# Patient Record
Sex: Female | Born: 1950
Health system: Southern US, Community
[De-identification: ages and names within clinical notes are randomized; demographics above are authoritative.]

## PROBLEM LIST (undated history)

## (undated) DIAGNOSIS — K5790 Diverticulosis of intestine, part unspecified, without perforation or abscess without bleeding: Secondary | ICD-10-CM

## (undated) DIAGNOSIS — I4510 Unspecified right bundle-branch block: Secondary | ICD-10-CM

## (undated) DIAGNOSIS — D649 Anemia, unspecified: Secondary | ICD-10-CM

## (undated) DIAGNOSIS — F419 Anxiety disorder, unspecified: Secondary | ICD-10-CM

## (undated) DIAGNOSIS — K219 Gastro-esophageal reflux disease without esophagitis: Secondary | ICD-10-CM

## (undated) DIAGNOSIS — E785 Hyperlipidemia, unspecified: Secondary | ICD-10-CM

## (undated) DIAGNOSIS — G5700 Lesion of sciatic nerve, unspecified lower limb: Secondary | ICD-10-CM

## (undated) DIAGNOSIS — D219 Benign neoplasm of connective and other soft tissue, unspecified: Secondary | ICD-10-CM

## (undated) DIAGNOSIS — F32A Depression, unspecified: Secondary | ICD-10-CM

## (undated) DIAGNOSIS — I341 Nonrheumatic mitral (valve) prolapse: Secondary | ICD-10-CM

## (undated) DIAGNOSIS — I739 Peripheral vascular disease, unspecified: Secondary | ICD-10-CM

## (undated) DIAGNOSIS — G4700 Insomnia, unspecified: Secondary | ICD-10-CM

## (undated) DIAGNOSIS — N8110 Cystocele, unspecified: Secondary | ICD-10-CM

## (undated) DIAGNOSIS — I10 Essential (primary) hypertension: Secondary | ICD-10-CM

## (undated) DIAGNOSIS — J309 Allergic rhinitis, unspecified: Secondary | ICD-10-CM

## (undated) DIAGNOSIS — R7303 Prediabetes: Secondary | ICD-10-CM

## (undated) DIAGNOSIS — R011 Cardiac murmur, unspecified: Secondary | ICD-10-CM

## (undated) DIAGNOSIS — K589 Irritable bowel syndrome without diarrhea: Secondary | ICD-10-CM

## (undated) DIAGNOSIS — F329 Major depressive disorder, single episode, unspecified: Secondary | ICD-10-CM

## (undated) DIAGNOSIS — R0602 Shortness of breath: Secondary | ICD-10-CM

## (undated) DIAGNOSIS — I639 Cerebral infarction, unspecified: Secondary | ICD-10-CM

## (undated) DIAGNOSIS — K529 Noninfective gastroenteritis and colitis, unspecified: Secondary | ICD-10-CM

## (undated) DIAGNOSIS — J449 Chronic obstructive pulmonary disease, unspecified: Secondary | ICD-10-CM

## (undated) HISTORY — DX: Noninfective gastroenteritis and colitis, unspecified: K52.9

## (undated) HISTORY — DX: Insomnia, unspecified: G47.00

## (undated) HISTORY — PX: CHOLECYSTECTOMY: SHX55

## (undated) HISTORY — DX: Hyperlipidemia, unspecified: E78.5

## (undated) HISTORY — DX: Irritable bowel syndrome, unspecified: K58.9

## (undated) HISTORY — DX: Peripheral vascular disease, unspecified: I73.9

## (undated) HISTORY — PX: UPPER GASTROINTESTINAL ENDOSCOPY: SHX188

## (undated) HISTORY — DX: Major depressive disorder, single episode, unspecified: F32.9

## (undated) HISTORY — DX: Diverticulosis of intestine, part unspecified, without perforation or abscess without bleeding: K57.90

## (undated) HISTORY — DX: Chronic obstructive pulmonary disease, unspecified: J44.9

## (undated) HISTORY — DX: Essential (primary) hypertension: I10

## (undated) HISTORY — DX: Anxiety disorder, unspecified: F41.9

## (undated) HISTORY — DX: Lesion of sciatic nerve, unspecified lower limb: G57.00

## (undated) HISTORY — DX: Depression, unspecified: F32.A

## (undated) HISTORY — DX: Allergic rhinitis, unspecified: J30.9

## (undated) HISTORY — DX: Gastro-esophageal reflux disease without esophagitis: K21.9

## (undated) HISTORY — DX: Unspecified right bundle-branch block: I45.10

## (undated) HISTORY — DX: Nonrheumatic mitral (valve) prolapse: I34.1

## (undated) HISTORY — PX: COLONOSCOPY: SHX174

---
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
## 2000-03-03 ENCOUNTER — Encounter: Admission: RE | Admit: 2000-03-03 | Discharge: 2000-03-03 | Payer: Self-pay | Admitting: Family Medicine

## 2000-03-03 ENCOUNTER — Encounter: Payer: Self-pay | Admitting: Family Medicine

## 2000-03-11 ENCOUNTER — Encounter: Payer: Self-pay | Admitting: General Surgery

## 2000-03-13 ENCOUNTER — Observation Stay (HOSPITAL_COMMUNITY): Admission: RE | Admit: 2000-03-13 | Discharge: 2000-03-14 | Payer: Self-pay | Admitting: General Surgery

## 2000-03-13 ENCOUNTER — Encounter (INDEPENDENT_AMBULATORY_CARE_PROVIDER_SITE_OTHER): Payer: Self-pay | Admitting: Specialist

## 2000-06-19 ENCOUNTER — Encounter: Payer: Self-pay | Admitting: Family Medicine

## 2000-06-19 ENCOUNTER — Encounter: Admission: RE | Admit: 2000-06-19 | Discharge: 2000-06-19 | Payer: Self-pay | Admitting: Family Medicine

## 2001-03-03 ENCOUNTER — Encounter: Payer: Self-pay | Admitting: Family Medicine

## 2001-03-03 ENCOUNTER — Encounter: Admission: RE | Admit: 2001-03-03 | Discharge: 2001-03-03 | Payer: Self-pay | Admitting: Family Medicine

## 2001-06-26 ENCOUNTER — Encounter: Payer: Self-pay | Admitting: Family Medicine

## 2001-06-26 ENCOUNTER — Encounter: Admission: RE | Admit: 2001-06-26 | Discharge: 2001-06-26 | Payer: Self-pay | Admitting: Family Medicine

## 2001-08-13 ENCOUNTER — Encounter: Admission: RE | Admit: 2001-08-13 | Discharge: 2001-08-13 | Payer: Self-pay | Admitting: Gastroenterology

## 2001-08-13 ENCOUNTER — Encounter: Payer: Self-pay | Admitting: Gastroenterology

## 2001-09-04 ENCOUNTER — Encounter: Admission: RE | Admit: 2001-09-04 | Discharge: 2001-09-04 | Payer: Self-pay | Admitting: Gastroenterology

## 2001-09-04 ENCOUNTER — Encounter: Payer: Self-pay | Admitting: Gastroenterology

## 2002-04-08 ENCOUNTER — Emergency Department (HOSPITAL_COMMUNITY): Admission: EM | Admit: 2002-04-08 | Discharge: 2002-04-08 | Payer: Self-pay

## 2002-07-27 ENCOUNTER — Encounter: Admission: RE | Admit: 2002-07-27 | Discharge: 2002-07-27 | Payer: Self-pay | Admitting: Obstetrics and Gynecology

## 2002-07-27 ENCOUNTER — Encounter: Payer: Self-pay | Admitting: Obstetrics and Gynecology

## 2003-09-16 ENCOUNTER — Encounter: Admission: RE | Admit: 2003-09-16 | Discharge: 2003-09-16 | Payer: Self-pay | Admitting: Family Medicine

## 2004-06-14 ENCOUNTER — Other Ambulatory Visit: Admission: RE | Admit: 2004-06-14 | Discharge: 2004-06-14 | Payer: Self-pay | Admitting: Family Medicine

## 2004-11-29 ENCOUNTER — Encounter: Admission: RE | Admit: 2004-11-29 | Discharge: 2004-11-29 | Payer: Self-pay | Admitting: Family Medicine

## 2006-03-04 ENCOUNTER — Encounter: Admission: RE | Admit: 2006-03-04 | Discharge: 2006-03-04 | Payer: Self-pay | Admitting: Family Medicine

## 2007-03-19 ENCOUNTER — Encounter: Admission: RE | Admit: 2007-03-19 | Discharge: 2007-03-19 | Payer: Self-pay | Admitting: Obstetrics and Gynecology

## 2007-06-11 ENCOUNTER — Other Ambulatory Visit: Admission: RE | Admit: 2007-06-11 | Discharge: 2007-06-11 | Payer: Self-pay | Admitting: Family Medicine

## 2008-05-13 ENCOUNTER — Encounter: Admission: RE | Admit: 2008-05-13 | Discharge: 2008-05-13 | Payer: Self-pay | Admitting: Family Medicine

## 2009-10-17 ENCOUNTER — Encounter: Admission: RE | Admit: 2009-10-17 | Discharge: 2009-10-17 | Payer: Self-pay | Admitting: Allergy and Immunology

## 2010-08-02 ENCOUNTER — Other Ambulatory Visit
Admission: RE | Admit: 2010-08-02 | Discharge: 2010-08-02 | Payer: Self-pay | Source: Home / Self Care | Admitting: Family Medicine

## 2011-03-08 NOTE — Op Note (Signed)
[facility]  Patient:    Shannon Roberts, Shannon Roberts                     MRN: [id number] Adm. Date:  [id number] Disc. Date: [id number] Attending:  Tempie Donning CC:         Bing Neighbors. Tenny Craw, M.D., [facility]                           Operative Report  PROCEDURE:  Laparoscopic cholecystectomy.  SURGEON:  Gita Kudo, M.D.  ASSISTANT:  Milus Mallick, M.D.  ANESTHESIA:  General endotracheal.  PREOPERATIVE DIAGNOSIS:  Cholecystitis.  POSTOPERATIVE DIAGNOSIS:  Cholecystitis.  CLINICAL SUMMARY:  A 60 year old female with bouts of abdominal pain. Gallbladder ultrasound shows stones, and her liver function studies are normal.  OPERATIVE FINDINGS:  The gallbladder was not acutely inflamed.  The cystic duct was short but had no stones in it.  The cystic artery was normal in size and anatomy.  DESCRIPTION OF PROCEDURE:  Under satisfactory general endotracheal anesthesia, having received 1 g Ancef preoperatively, the patients abdomen was prepped and draped in a standard fashion.  A small midline incision made at the umbilicus and carried down to the midline.  The midline was opened into the peritoneum, and then a 0 Vicryl figure-of-eight suture was used to control this.  A Hasson operating port was then inserted and secured and used to get good CO2 pneumoperitoneum.  Camera placed and exposure was excellent and visualization good, and two #5 ports were placed laterally and a second #10 port medially through small skin incisions that had been infiltrate with Marcaine.  Then graspers placed at the lateral port gave good exposure and operating through the medial port, I carefully identified the cystic duct- gallbladder junction.  This was circumferentially dissected with a right angle clamp and when sure of the anatomy, it was controlled with multiple metal clips and divided.  Likewise, the cystic artery was circumferentially dissected,  identified, and transected after clipping.  The gallbladder was then removed from below upward using the cautery hook device for both hemostasis and dissection.  The liver bed was dry, lavaged with saline and checked for hemostasis by cautery.  The gallbladder was then severed.  At this point, an Endo-Suture device was then used to place a 0 Vicryl suture through the upper medial port for closure later.  Then the camera was moved to the upper port and through the lower port, a large grasper used to retrieve the gallbladder intact and without spillage or complication.  The operative site was then checked for hemostasis and lavaged with saline.  Under direct vision, both lateral ports were removed and then the medial port removed.  All CO2 was released.  The upper medial incision was closed with a previously placed 0 Vicryl and then the umbilical site closed with the previous figure-of-eight 0 Vicryl, and both the ports infiltrated with Marcaine and also the two approximated with 4-0 Vicryl and skin edges with Steri-Strips. Sterile absorbent dressings were then applied, and the patient went to the recovery room from the operating room in good condition without complication. D:  03/13/00 TD:  03/17/00 Job: [id number]

## 2011-05-07 ENCOUNTER — Encounter: Payer: Self-pay | Admitting: Gastroenterology

## 2011-05-07 NOTE — Telephone Encounter (Signed)
Error

## 2011-05-13 ENCOUNTER — Ambulatory Visit (INDEPENDENT_AMBULATORY_CARE_PROVIDER_SITE_OTHER): Payer: BC Managed Care – PPO | Admitting: Gastroenterology

## 2011-05-13 ENCOUNTER — Encounter: Payer: Self-pay | Admitting: Gastroenterology

## 2011-05-13 DIAGNOSIS — R1310 Dysphagia, unspecified: Secondary | ICD-10-CM

## 2011-05-13 DIAGNOSIS — Z860100 Personal history of colon polyps, unspecified: Secondary | ICD-10-CM

## 2011-05-13 DIAGNOSIS — J449 Chronic obstructive pulmonary disease, unspecified: Secondary | ICD-10-CM

## 2011-05-13 DIAGNOSIS — K219 Gastro-esophageal reflux disease without esophagitis: Secondary | ICD-10-CM

## 2011-05-13 DIAGNOSIS — K589 Irritable bowel syndrome without diarrhea: Secondary | ICD-10-CM

## 2011-05-13 MED ORDER — HYOSCYAMINE SULFATE ER 0.375 MG PO TBCR: EXTENDED_RELEASE_TABLET | ORAL | Status: DC

## 2011-05-13 MED ORDER — PEG-KCL-NACL-NASULF-NA ASC-C 100 G PO SOLR: 1.0000 | Freq: Once | ORAL | Status: DC

## 2011-05-13 MED ORDER — OMEPRAZOLE-SODIUM BICARBONATE 40-1100 MG PO CAPS: ORAL_CAPSULE | ORAL | Status: DC

## 2011-05-13 NOTE — Assessment & Plan Note (Addendum)
She has dysphagia and recent onset of odynophagia. A peptic esophageal stricture is a consideration. She could also have candida esophagitis in view of her relatively recent antibiotic use.  Recommendations #1 upper endoscopy with dilatation as indicated

## 2011-05-13 NOTE — Assessment & Plan Note (Signed)
Plan followup colonoscopy 

## 2011-05-13 NOTE — Assessment & Plan Note (Signed)
Plan trial of fiber supplementation and hyomax 1-2 times a day

## 2011-05-13 NOTE — Patient Instructions (Addendum)
Irritable Bowel Syndrome (Spastic Colon) Irritable Bowel Syndrome (IBS) is caused by a disturbance of normal bowel function. Other terms used are spastic colon, mucous colitis, and irritable colon. It does not require surgery, nor does it lead to cancer. There is no cure for IBS. But with proper diet, stress reduction, and medication, you will find that your problems (symptoms) will gradually disappear or improve. IBS is a common digestive disorder. It usually appears in late adolescence or early adulthood. Women develop it twice as often as men. CAUSES After food has been digested and absorbed in the small intestine, waste material is moved into the colon (large intestine). In the colon, water and salts are absorbed from the undigested products coming from the small intestine. The remaining residue, or fecal material, is held for elimination. Under normal circumstances, gentle, rhythmic contractions on the bowel walls push the fecal material along the colon towards the rectum. In IBS, however, these contractions are irregular and poorly coordinated. The fecal material is either retained too long, resulting in constipation, or expelled too soon, producing diarrhea. SYMPTOMS  The most common symptom of IBS is pain. It is typically in the lower left side of the belly (abdomen). But it may occur anywhere in the abdomen. It can be felt as heartburn, backache, or even as a dull pain in the arms or shoulders. The pain comes from excessive bowel-muscle spasms and from the buildup of gas and fecal material in the colon. This pain:  Can range from sharp belly (abdominal) cramps to a dull, continuous ache.   Usually worsens soon after eating.   Is typically relieved by having a bowel movement or passing gas.  Abdominal pain is usually accompanied by constipation. But it may also produce diarrhea. The diarrhea typically occurs right after a meal or upon arising in the morning. The stools are typically soft and  watery. They are often flecked with secretions (mucus). Other symptoms of IBS include:  Bloating.  Loss of appetite.   Heartburn.  Feeling sick to your stomach  (nausea).   Belching  Vomiting   Gas.  IBS may also cause a number of symptoms that are unrelated to the digestive system:  Fatigue.  Headaches.   Anxiety  Shortness of breath   Difficulty in concentrating.  Dizziness.   These symptoms tend to come and go. DIAGNOSIS The symptoms of IBS closely mimic the symptoms of other, more serious digestive disorders. So your caregiver may wish to perform a variety of additional tests to exclude these disorders. He/she wants to be certain of learning what is wrong (diagnosis). The nature and purpose of each test will be explained to you. TREATMENT A number of medications are available to help correct bowel function and/or relieve bowel spasms and abdominal pain. Among the drugs available are:  Mild, non-irritating laxatives for severe constipation and to help restore normal bowel habits.   Specific anti-diarrheal medications to treat severe or prolonged diarrhea.   Anti-spasmodic agents to relieve intestinal cramps.   Your caregiver may also decide to treat you with a mild tranquilizer or sedative during unusually stressful periods in your life.  The important thing to remember is that if any drug is prescribed for you, make sure that you take it exactly as directed. Make sure that your caregiver knows how well it worked for you. HOME CARE INSTRUCTIONS   Avoid foods that are high in fat or oils. Some examples ZOX:WRUEA cream, butter, frankfurters, sausage, and other fatty meats.   Avoid  foods that have a laxative effect, such as fruit, fruit juice, and dairy products.   Cut out carbonated drinks, chewing gum, and "gassy" foods, such as beans and cabbage. This may help relieve bloating and belching.   Bran taken with plenty of liquids may help relieve constipation.   Keep  track of what foods seem to trigger your symptoms.   Avoid emotionally charged situations or circumstances that produce anxiety.   Start or continue exercising.   Get plenty of rest and sleep.  MAKE SURE YOU:   Understand these instructions.   Will watch your condition.   Will get help right away if you are not doing well or get worse.  Document Released: 10/07/2005 Document Re-Released: 02/23/2009 [facility] Patient Information 2011 [place], [place]. Your Endoscopy is scheduled on 05/14/2011 at 10am Your colonoscopy is scheduled on 06/27/2011 at 8am We are sending your MoviPrep to your pharmacy

## 2011-05-13 NOTE — Assessment & Plan Note (Signed)
She has mostly nocturnal GERD despite medications.  Recommendations #1 trial of zegerid 40 mg each bedtime

## 2011-05-13 NOTE — Progress Notes (Signed)
History of Present Illness:  Shannon Roberts is a 60 year old white female referred at the request of Dr. Tenny Craw for evaluation of dyspepsia. She  has had several courses of antibiotics over the years for H. pylori. Last course was about 2 months ago. She complains of postprandial abdominal discomfort including bloating and fullness. She has frequent pyrosis, particularly at night. She takes both Protonix and, more recently, Carafate. She is complaining of odynophagia over the last few days,  and also dysphagia to solids. She is on no gastric irritants including nonsteroidals. She has a history of colon polyps. Multiple polyps were removed in 2004 although none were submitted to pathology. The largest polyp measured 4 mm. She has extreme urgency although she passes a solid stool. There is no history of melena or hematochezia.    Review of Systems: Pertinent positive and negative review of systems were noted in the above HPI section. All other review of systems were otherwise negative.    Current Medications, Allergies, Past Medical History, Past Surgical History, Family History and Social History were reviewed in [employer] electronic medical record  Vital signs were reviewed in today's medical record. Physical Exam: General: Well developed , well nourished, no acute distress Head: Normocephalic and atraumatic Eyes:  sclerae anicteric, EOMI Ears: Normal auditory acuity Mouth: No deformity or lesions Lungs: Clear throughout to auscultation Heart: Regular rate and rhythm; no murmurs, rubs or bruits Abdomen: Soft, non tender and non distended. No masses, hepatosplenomegaly or hernias noted. Normal Bowel sounds Rectal:deferred Musculoskeletal: Symmetrical with no gross deformities  Pulses:  Normal pulses noted Extremities: No clubbing, cyanosis, edema or deformities noted Neurological: Alert oriented x 4, grossly nonfocal Psychological:  Alert and cooperative. Normal mood and affect

## 2011-05-14 ENCOUNTER — Ambulatory Visit (AMBULATORY_SURGERY_CENTER): Payer: BC Managed Care – PPO | Admitting: Gastroenterology

## 2011-05-14 ENCOUNTER — Encounter: Payer: Self-pay | Admitting: Gastroenterology

## 2011-05-14 ENCOUNTER — Telehealth: Payer: Self-pay

## 2011-05-14 DIAGNOSIS — K222 Esophageal obstruction: Secondary | ICD-10-CM

## 2011-05-14 DIAGNOSIS — K219 Gastro-esophageal reflux disease without esophagitis: Secondary | ICD-10-CM

## 2011-05-14 DIAGNOSIS — K3189 Other diseases of stomach and duodenum: Secondary | ICD-10-CM

## 2011-05-14 DIAGNOSIS — R1310 Dysphagia, unspecified: Secondary | ICD-10-CM

## 2011-05-14 MED ORDER — SODIUM CHLORIDE 0.9 % IV SOLN: 500.0000 mL | INTRAVENOUS | Status: DC

## 2011-05-14 NOTE — Telephone Encounter (Signed)
Pt had EGD today, needs OV. !st available appt is 06/26/11@10 :30am. Pt is scheduled for colon on 06/27/11@8am . Does she need the OV on 9/5 or can she wait until after the colon for a follow-up? Please advise.

## 2011-05-14 NOTE — Telephone Encounter (Signed)
Wait until after colo

## 2011-05-15 ENCOUNTER — Telehealth: Payer: Self-pay | Admitting: *Deleted

## 2011-05-15 DIAGNOSIS — K219 Gastro-esophageal reflux disease without esophagitis: Secondary | ICD-10-CM

## 2011-05-15 NOTE — Telephone Encounter (Signed)

## 2011-06-12 ENCOUNTER — Ambulatory Visit: Payer: Self-pay | Admitting: Gastroenterology

## 2011-06-26 ENCOUNTER — Ambulatory Visit: Payer: BC Managed Care – PPO | Admitting: Gastroenterology

## 2011-06-27 ENCOUNTER — Encounter: Payer: Self-pay | Admitting: Gastroenterology

## 2011-06-27 ENCOUNTER — Ambulatory Visit (AMBULATORY_SURGERY_CENTER): Payer: BC Managed Care – PPO | Admitting: Gastroenterology

## 2011-06-27 VITALS — BP 199/123 | HR 89 | Temp 97.6°F | Resp 16

## 2011-06-27 DIAGNOSIS — Z86010 Personal history of colonic polyps: Secondary | ICD-10-CM

## 2011-06-27 DIAGNOSIS — K648 Other hemorrhoids: Secondary | ICD-10-CM

## 2011-06-27 DIAGNOSIS — Z1211 Encounter for screening for malignant neoplasm of colon: Secondary | ICD-10-CM

## 2011-06-27 MED ORDER — SODIUM CHLORIDE 0.9 % IV SOLN: 500.0000 mL | INTRAVENOUS | Status: DC

## 2011-06-27 NOTE — Patient Instructions (Signed)
Please read your handout that your recovery nurse gave you.   Increase the fiber in your diet to help relieve your hemorrhoids.   Resume your routine medications today.  Call us if you have any questions at [phone].  Thank-you for coming to [place] for your healthcare needs.

## 2011-06-28 ENCOUNTER — Telehealth: Payer: Self-pay

## 2011-06-28 NOTE — Telephone Encounter (Signed)
Left message on answering machine. 

## 2011-07-02 ENCOUNTER — Telehealth: Payer: Self-pay | Admitting: Gastroenterology

## 2011-07-02 NOTE — Telephone Encounter (Signed)
Pt had colon 06/27/11, states she is having some tenderness in her abdomen. Asked pt if she had experienced some gas and cramping and she state she has. She states her abdomen is sore like when you exercise some. Pt instructed to call if the discomfort did not get better or go away.

## 2011-07-07 ENCOUNTER — Other Ambulatory Visit: Payer: Self-pay | Admitting: Gastroenterology

## 2011-07-15 ENCOUNTER — Encounter: Payer: Self-pay | Admitting: Gastroenterology

## 2011-07-15 ENCOUNTER — Ambulatory Visit (INDEPENDENT_AMBULATORY_CARE_PROVIDER_SITE_OTHER): Payer: BC Managed Care – PPO | Admitting: Gastroenterology

## 2011-07-15 DIAGNOSIS — K589 Irritable bowel syndrome without diarrhea: Secondary | ICD-10-CM

## 2011-07-15 DIAGNOSIS — Z860100 Personal history of colon polyps, unspecified: Secondary | ICD-10-CM

## 2011-07-15 DIAGNOSIS — K219 Gastro-esophageal reflux disease without esophagitis: Secondary | ICD-10-CM

## 2011-07-15 DIAGNOSIS — R1310 Dysphagia, unspecified: Secondary | ICD-10-CM

## 2011-07-15 MED ORDER — PANTOPRAZOLE SODIUM 40 MG PO TBEC: 40.0000 mg | DELAYED_RELEASE_TABLET | Freq: Every day | ORAL | Status: DC

## 2011-07-15 NOTE — Assessment & Plan Note (Signed)
She continues to complain of dysphagia. A fixed peptic stricture is unlikely. A motility disorder is a consideration.  Recommendations #1 barium swallow

## 2011-07-15 NOTE — Assessment & Plan Note (Signed)
Plan followup colonoscopy 2022 

## 2011-07-15 NOTE — Progress Notes (Signed)
History of Present Illness:  Shannon Roberts has returned following upper endoscopy and colonoscopy. The former was normal except for possible early esophageal stricture for which she underwent dilatation with a Maloney dilator. The latter demonstrated internal hemorrhoids only. She still complains of intermittent dysphagia to solids and liquids. She feels as though food is getting stuck in her lower chest. Her other complaint is diarrhea. She found frequently will have multiple loose stools in the morning. There is no history of melena or hematochezia.  She no longer has abdominal pain and bloating. She complains of excess belching since starting Zegerid. Pyrosis has resolved.    Review of Systems: Pertinent positive and negative review of systems were noted in the above HPI section. All other review of systems were otherwise negative.    Current Medications, Allergies, Past Medical History, Past Surgical History, Family History and Social History were reviewed in [employer] electronic medical record  Vital signs were reviewed in today's medical record. Physical Exam: General: Well developed , well nourished, no acute distress

## 2011-07-15 NOTE — Patient Instructions (Signed)
Research will be speaking with you today Your Barium Swallow test is scheduled on 07/17/2011 at 9:15am at [facility] 1st floor Nothing to eat or drink after midinght

## 2011-07-15 NOTE — Assessment & Plan Note (Addendum)
She prefers protonix over dexilant since she developed excess belching with the latter.

## 2011-07-15 NOTE — Assessment & Plan Note (Signed)
Patient will consider enrollment in an IBS trial

## 2011-07-17 ENCOUNTER — Other Ambulatory Visit (HOSPITAL_COMMUNITY): Payer: BC Managed Care – PPO

## 2011-08-15 ENCOUNTER — Telehealth: Payer: Self-pay | Admitting: Gastroenterology

## 2011-08-15 NOTE — Telephone Encounter (Signed)
Patient given the number at radiology scheduling to reschedule her barium swallow, [phone]. Pt verbalized understanding.

## 2011-08-20 ENCOUNTER — Other Ambulatory Visit (HOSPITAL_COMMUNITY): Payer: BC Managed Care – PPO

## 2011-08-28 ENCOUNTER — Ambulatory Visit (HOSPITAL_COMMUNITY)
Admission: RE | Admit: 2011-08-28 | Discharge: 2011-08-28 | Disposition: A | Discharge status: Home / Self Care | Payer: BC Managed Care – PPO | Source: Ambulatory Visit | Attending: Gastroenterology | Admitting: Gastroenterology

## 2011-08-28 DIAGNOSIS — R1310 Dysphagia, unspecified: Secondary | ICD-10-CM | POA: Insufficient documentation

## 2011-08-28 DIAGNOSIS — K224 Dyskinesia of esophagus: Secondary | ICD-10-CM | POA: Insufficient documentation

## 2011-08-29 ENCOUNTER — Telehealth: Payer: Self-pay

## 2011-08-29 NOTE — Telephone Encounter (Signed)
Pt aware and scheduled for OV with Dr. Arlyce Dice 09/04/11@2 :15pm.

## 2011-08-29 NOTE — Telephone Encounter (Signed)
Message copied by Michele Mcalpine on Thu Aug 29, 2011  9:01 AM ------      Message from: Melvia Heaps D      Created: Wed Aug 28, 2011  5:23 PM       Informed patient that the x-ray shows esophageal dysmotility. There is no specific therapy for this. Asked her to return for an office visit in the next few weeks.

## 2011-09-03 ENCOUNTER — Telehealth: Payer: Self-pay | Admitting: Gastroenterology

## 2011-09-03 NOTE — Telephone Encounter (Signed)
Pt states that she cancelled her appt because she started taking protonix again and feels much better. Dr. Arlyce Dice aware.

## 2011-09-03 NOTE — Telephone Encounter (Signed)
ok 

## 2011-09-04 ENCOUNTER — Ambulatory Visit: Payer: BC Managed Care – PPO | Admitting: Gastroenterology

## 2011-10-07 ENCOUNTER — Encounter (HOSPITAL_COMMUNITY): Payer: Self-pay | Admitting: Emergency Medicine

## 2011-10-07 ENCOUNTER — Emergency Department (HOSPITAL_COMMUNITY): Payer: BC Managed Care – PPO

## 2011-10-07 ENCOUNTER — Other Ambulatory Visit: Payer: Self-pay

## 2011-10-07 ENCOUNTER — Observation Stay (HOSPITAL_COMMUNITY)
Admission: EM | Admit: 2011-10-07 | Discharge: 2011-10-08 | DRG: 134 | Disposition: A | Discharge status: Home / Self Care | Payer: BC Managed Care – PPO | Attending: Internal Medicine | Admitting: Internal Medicine

## 2011-10-07 DIAGNOSIS — Z79899 Other long term (current) drug therapy: Secondary | ICD-10-CM | POA: Insufficient documentation

## 2011-10-07 DIAGNOSIS — R0989 Other specified symptoms and signs involving the circulatory and respiratory systems: Secondary | ICD-10-CM | POA: Insufficient documentation

## 2011-10-07 DIAGNOSIS — F411 Generalized anxiety disorder: Secondary | ICD-10-CM | POA: Insufficient documentation

## 2011-10-07 DIAGNOSIS — F17200 Nicotine dependence, unspecified, uncomplicated: Secondary | ICD-10-CM | POA: Insufficient documentation

## 2011-10-07 DIAGNOSIS — J4489 Other specified chronic obstructive pulmonary disease: Secondary | ICD-10-CM | POA: Insufficient documentation

## 2011-10-07 DIAGNOSIS — K219 Gastro-esophageal reflux disease without esophagitis: Secondary | ICD-10-CM | POA: Insufficient documentation

## 2011-10-07 DIAGNOSIS — J449 Chronic obstructive pulmonary disease, unspecified: Secondary | ICD-10-CM | POA: Diagnosis present

## 2011-10-07 DIAGNOSIS — I10 Essential (primary) hypertension: Principal | ICD-10-CM | POA: Insufficient documentation

## 2011-10-07 DIAGNOSIS — Z720 Tobacco use: Secondary | ICD-10-CM | POA: Diagnosis present

## 2011-10-07 DIAGNOSIS — E785 Hyperlipidemia, unspecified: Secondary | ICD-10-CM | POA: Diagnosis present

## 2011-10-07 DIAGNOSIS — F419 Anxiety disorder, unspecified: Secondary | ICD-10-CM | POA: Diagnosis present

## 2011-10-07 LAB — TROPONIN I: Troponin I: <0.3 ng/mL (ref <0.30)

## 2011-10-07 LAB — BASIC METABOLIC PANEL
BUN: 14 mg/dL (ref 6–23)
Creatinine, Ser: 0.94 mg/dL (ref 0.50–1.10)
GFR calc Af Amer: 74 mL/min — ABNORMAL LOW (ref >90)
GFR calc non Af Amer: 64 mL/min — ABNORMAL LOW (ref >90)

## 2011-10-07 LAB — CBC
MCV: 91.5 fL (ref 78.0–100.0)
Platelets: 284 10*3/uL (ref 150–400)
RBC: 4.71 MIL/uL (ref 3.87–5.11)
RDW: 12.9 % (ref 11.5–15.5)
WBC: 9.5 10*3/uL (ref 4.0–10.5)

## 2011-10-07 LAB — DIFFERENTIAL
Basophils Absolute: 0.1 10*3/uL (ref 0.0–0.1)
Eosinophils Absolute: 0.3 10*3/uL (ref 0.0–0.7)
Eosinophils Relative: 3 % (ref 0–5)
Lymphocytes Relative: 24 % (ref 12–46)
Lymphs Abs: 2.3 10*3/uL (ref 0.7–4.0)
Neutrophils Relative %: 65 % (ref 43–77)

## 2011-10-07 LAB — URINALYSIS, ROUTINE W REFLEX MICROSCOPIC
Glucose, UA: NEGATIVE mg/dL
Ketones, ur: NEGATIVE mg/dL
Leukocytes, UA: NEGATIVE
Nitrite: NEGATIVE
Protein, ur: NEGATIVE mg/dL
pH: 6.5 (ref 5.0–8.0)

## 2011-10-07 MED ORDER — LORATADINE 10 MG PO TABS
10.0000 mg | ORAL_TABLET | Freq: Every day | ORAL | Status: DC
  Administered 2011-10-08: 10 mg via ORAL
  Filled 2011-10-07 (×2): qty 1

## 2011-10-07 MED ORDER — HYDRALAZINE HCL 20 MG/ML IJ SOLN
10.0000 mg | Freq: Once | INTRAMUSCULAR | Status: AC
  Administered 2011-10-07: 10 mg via INTRAVENOUS
  Filled 2011-10-07: qty 0.5

## 2011-10-07 MED ORDER — CLONIDINE HCL 0.1 MG PO TABS
0.2000 mg | ORAL_TABLET | ORAL | Status: AC
  Administered 2011-10-07: 0.2 mg via ORAL
  Filled 2011-10-07: qty 2

## 2011-10-07 MED ORDER — ALPRAZOLAM 0.25 MG PO TABS
0.3750 mg | ORAL_TABLET | Freq: Three times a day (TID) | ORAL | Status: DC | PRN
  Administered 2011-10-07: 0.25 mg via ORAL
  Filled 2011-10-07: qty 1

## 2011-10-07 MED ORDER — FENOFIBRATE 160 MG PO TABS
160.0000 mg | ORAL_TABLET | Freq: Every day | ORAL | Status: DC
  Administered 2011-10-07 – 2011-10-08 (×2): 160 mg via ORAL
  Filled 2011-10-07 (×3): qty 1

## 2011-10-07 MED ORDER — FLUTICASONE PROPIONATE 50 MCG/ACT NA SUSP
1.0000 | Freq: Every day | NASAL | Status: DC
  Filled 2011-10-07: qty 16

## 2011-10-07 MED ORDER — SODIUM CHLORIDE 0.9 % IV SOLN
INTRAVENOUS | Status: DC
  Administered 2011-10-07: 17:00:00 via INTRAVENOUS

## 2011-10-07 MED ORDER — DULOXETINE HCL 30 MG PO CPEP
30.0000 mg | ORAL_CAPSULE | Freq: Every day | ORAL | Status: DC
  Administered 2011-10-07: 30 mg via ORAL
  Filled 2011-10-07 (×3): qty 1

## 2011-10-07 MED ORDER — LABETALOL HCL 5 MG/ML IV SOLN
20.0000 mg | Freq: Once | INTRAVENOUS | Status: AC
  Administered 2011-10-07: 20 mg via INTRAVENOUS
  Filled 2011-10-07: qty 4

## 2011-10-07 MED ORDER — ALPRAZOLAM 0.25 MG PO TABS
0.2500 mg | ORAL_TABLET | Freq: Once | ORAL | Status: AC
  Administered 2011-10-07: 0.25 mg via ORAL
  Filled 2011-10-07: qty 1

## 2011-10-07 MED ORDER — ATENOLOL 50 MG PO TABS
50.0000 mg | ORAL_TABLET | Freq: Every day | ORAL | Status: DC
  Administered 2011-10-08: 50 mg via ORAL
  Filled 2011-10-07: qty 1

## 2011-10-07 MED ORDER — METOPROLOL TARTRATE 1 MG/ML IV SOLN
5.0000 mg | INTRAVENOUS | Status: AC
  Administered 2011-10-07: 5 mg via INTRAVENOUS
  Filled 2011-10-07: qty 5

## 2011-10-07 MED ORDER — CLONIDINE HCL 0.1 MG PO TABS
0.1000 mg | ORAL_TABLET | Freq: Once | ORAL | Status: AC
  Administered 2011-10-07: 0.1 mg via ORAL
  Filled 2011-10-07: qty 1

## 2011-10-07 NOTE — H&P (Signed)
PCP:   Daisy Floro, MD   Chief Complaint:  High blood pressure  HPI: Patient is a 60 year old white female past history COPD, tobacco abuse, hyperlipidemia and high blood pressure which has not been well-controlled who for the past 2 days is complaining of upper respiratory symptoms. Her PCP was not available so she went to the [place] walk in clinic.  Patient normally just on a beta blocker and she was given a diuretic at the walk-in clinic, but did not take this because she was not sure her PCP would want her to do so.  The patient's daughter, who is a former Engineer, civil (consulting) from [employer], check the patient's blood pressure today when she was complaining of feeling weak and found her blood pressure to be in a systolic 200s. Patient was then brought into the emergency room.  In the emergency room, her EKG was unremarkable. Cardiac markers were unremarkable. Lab work was unremarkable. Her blood pressure however remains persistently elevated, even after a dose of IV labetalol and by mouth clonidine. Hospitalists were called for evaluation and admission.  Review of Systems:  When I saw the patient, she was doing okay. She complained of just feeling very tired. She denied any headache, vision changes, chest pain, palpitations, shortness of breath, abdominal pain, hematuria, dysuria, constipation, diarrhea, no focal extremity numbness, weakness or pain. Her only complaints are of a chronic productive cough which she's had for a long time because of COPD. She feels anxious, although better since she took a Xanax prior to coming in. No nausea vomiting. She has not felt currently short of breath.  Past Medical History: Past Medical History  Diagnosis Date  . IBS (irritable bowel syndrome)   . Anxiety   . GERD (gastroesophageal reflux disease)   . Depression   . Diverticulosis   . Hyperlipemia   . Hypertension   . Mitral valve prolapse   . Colitis   . COPD (chronic obstructive pulmonary disease)    no o2  . Sciatic nerve disease    Past Surgical History  Procedure Date  . Cholecystectomy     Medications: Prior to Admission medications   Medication Sig Start Date End Date Taking? Authorizing Provider  ALPRAZolam (XANAX) 0.25 MG tablet Take 0.375 mg by mouth at bedtime as needed. For sleep. 02/26/11  Yes Historical Provider, MD  atenolol (TENORMIN) 50 MG tablet Take 50 mg by mouth daily.     Yes Historical Provider, MD  azithromycin (ZITHROMAX) 250 MG tablet Take 250-500 mg by mouth daily. 5 day course of therapy; not completed.    Yes Historical Provider, MD  cetirizine (ZYRTEC) 10 MG tablet Take 10 mg by mouth daily as needed.     Yes Historical Provider, MD  COMBIVENT 18-103 MCG/ACT inhaler Inhale 1-2 puffs into the lungs 4 (four) times daily.  04/30/11  Yes Historical Provider, MD  CYMBALTA 30 MG capsule Take one tablet by mouth once daily  05/03/11  Yes Historical Provider, MD  fenofibrate 160 MG tablet One tablet by mouth once daily 02/23/11  Yes Historical Provider, MD  fluticasone (FLONASE) 50 MCG/ACT nasal spray Place 2 sprays into the nose daily.  03/12/11  Yes Historical Provider, MD  pantoprazole (PROTONIX) 40 MG tablet Take 40 mg by mouth 2 (two) times daily as needed. For stomach upset.    Yes Historical Provider, MD    Allergies:   Allergies  Allergen Reactions  . Amoxicillin     Social History:  reports that she has been  smoking Cigarettes.  She has a 40 pack-year smoking history. She has never used smokeless tobacco. She reports that she does not drink alcohol or use illicit drugs. she lives at home by herself. She normally gets around well without any difficulty and can to participate in full ADLs.  Family History: Family History  Problem Relation Age of Onset  . Colon cancer Paternal Aunt   . Breast cancer Mother   . Ovarian cancer Mother   . Pancreatic cancer Maternal Grandmother   . Diabetes Maternal Aunt   . Heart disease Paternal Grandfather   . Kidney  disease Maternal Aunt     Physical Exam: Filed Vitals:   10/07/11 1920 10/07/11 1928 10/07/11 2009 10/07/11 2140  BP: 194/82 188/81 212/91 177/62  Pulse:   84 86  Temp:   98.7 F (37.1 C)   TempSrc:   Oral   Resp:   14 18  Height:      Weight:      SpO2:   99% 100%   General: Alert and oriented x3, looks slightly older than stated age, fatigued, no acute distress HEENT: Normocephalic, atraumatic, mucous membranes are dry Cardiovascular: Regular rate and rhythm S1-S2 Lungs: Mild end expiratory wheezing more on the right side Abdomen: Soft, nontender, obese, positive bowel sounds Extremities: No clubbing or cyanosis, trace pitting edema Neuro: No focal deficits   Labs on Admission:   [facility] 10/07/11 1650  NA 135  K 4.2  CL 102  CO2 25  GLUCOSE 95  BUN 14  CREATININE 0.94  CALCIUM 10.6*  MG --  PHOS --    Basename 10/07/11 1650  WBC 9.5  NEUTROABS 6.2  HGB 14.5  HCT 43.1  MCV 91.5  PLT 284    Basename 10/07/11 1650  CKTOTAL --  CKMB --  CKMBINDEX --  TROPONINI <0.30   Radiological Exams on Admission:  Dg Chest 2 View 10/07/2011  IMPRESSION: Negative for CHF or pneumonia.   EKG reviewed: No ST or T wave elevations or depressions, nothing acute  Assessment/Plan Present on Admission:  .Tobacco abuse: Counseled, nicotine patch  .Esophageal reflux: Continue PPI  .COPD (chronic obstructive pulmonary disease): Stable, we'll give her oxygen while she is here when necessary Xopenex instead of albuterol  .HTN (hypertension), malignant: At this time no signs of any end organ damage such as the heart. Have reviewed history thoroughly and find no evidence of preceding event such as TIA or strokelike symptoms. Suspect this may all be from just not well-controlled hypertension overall. Will continue her beta blocker and add ACE inhibitor, Imdur were, HCTZ and clonidine. When necessary IV Lopressor and hydralazine. If she responds well and pressures are down  without any difficulty or symptoms, likely can go home tomorrow. Issue pressures remain persistently elevated, we'll look into echocardiogram and further workup.  Marland KitchenHyperlipidemia: Continue statin  .Anxiety disorder: When necessary Xanax  Have discussed with patient and she is to be a full code. Anticipate possible discharge by tomorrow. Hollice Espy 10/07/2011, 9:43 PM

## 2011-10-07 NOTE — ED Notes (Signed)
At pt's request, CSW provided the pt with the packet for Advance Directives and reviewed the process with the pt. Pt requests that would Inpatient CSW meet with her on 10/08/11 to sign/notarize the document.

## 2011-10-07 NOTE — ED Notes (Signed)
Consult at bedside.

## 2011-10-07 NOTE — ED Notes (Signed)
New ECG was obtained on pt.  New and old ECG's given to EDP for evaluation.

## 2011-10-07 NOTE — ED Notes (Signed)
Pt was seen at clinic and given zpack for infection, pt was noted to have elevated bp and was given the same bp med at that she takes at home atenolol, pt did not take duplicate dose. Pt states bp has still been elevated. Pt states has not been feeling well. Pt noted to be anxious at times.

## 2011-10-07 NOTE — ED Notes (Signed)
Pt assisted to bathroom, no other complaints, requesting combivent, but then states that doctor informed her it makes her bp increase. Informed patient that she had xopenex ordered, but pt requested to defer on xopenex treatment until later, no other needs at this time. Pt assisted back to bed monitor nsr, bp hourly, will cont. To monitor

## 2011-10-07 NOTE — ED Notes (Signed)
Daughter took patients blood pressure this afternoon, 200/102 and 184/102

## 2011-10-07 NOTE — ED Notes (Signed)
MD aware b/p

## 2011-10-07 NOTE — ED Provider Notes (Signed)
History     CSN: [id number] Arrival date & time: 10/07/2011  4:26 PM   First MD Initiated Contact with Patient 10/07/11 1704      Chief Complaint  Patient presents with  . Hypertension    (Consider location/radiation/quality/duration/timing/severity/associated sxs/prior treatment) Patient is a 60 y.o. female presenting with hypertension. The history is provided by the patient.  Hypertension Associated symptoms include shortness of breath. Pertinent negatives include no chest pain, no abdominal pain and no headaches.   patient's had a cough and felt bad the last few days. She's had some chills. She's had some yellow sputum with her cough. She was seen in the clinic yesterday and had elevated blood pressure. She was given atenolol with a diuretic. She did not take this. She's also given azithromycin inhaler. She states she just took one dose of the azithromycin. No chest pain. No headache. She has a history of high blood pressure. She does have some anxiety. No numbness or weakness.  Past Medical History  Diagnosis Date  . IBS (irritable bowel syndrome)   . Anxiety   . GERD (gastroesophageal reflux disease)   . Depression   . Diverticulosis   . Hyperlipemia   . Hypertension   . Mitral valve prolapse   . Colitis   . COPD (chronic obstructive pulmonary disease)     no o2  . Sciatic nerve disease     Past Surgical History  Procedure Date  . Cholecystectomy     Family History  Problem Relation Age of Onset  . Colon cancer Paternal Aunt   . Breast cancer Mother   . Ovarian cancer Mother   . Pancreatic cancer Maternal Grandmother   . Diabetes Maternal Aunt   . Heart disease Paternal Grandfather   . Kidney disease Maternal Aunt     History  Substance Use Topics  . Smoking status: Current Everyday Smoker -- 1.0 packs/day for 40 years    Types: Cigarettes  . Smokeless tobacco: Never Used  . Alcohol Use: No    OB History    Grav Para Term Preterm Abortions TAB SAB Ect  Mult Living                  Review of Systems  Constitutional: Positive for chills and fatigue.  Eyes: Negative for itching.  Respiratory: Positive for cough and shortness of breath.   Cardiovascular: Negative for chest pain.  Gastrointestinal: Negative for abdominal pain and constipation.  Musculoskeletal: Positive for myalgias. Negative for back pain.  Skin: Negative for color change.  Neurological: Negative for seizures, weakness, numbness and headaches.  Psychiatric/Behavioral: Negative for agitation.    Allergies  Amoxicillin  Home Medications   Current Outpatient Rx  Name Route Sig Dispense Refill  . ALPRAZOLAM 0.25 MG PO TABS Oral Take 0.375 mg by mouth at bedtime as needed. For sleep.    . ATENOLOL 50 MG PO TABS Oral Take 50 mg by mouth daily.      . AZITHROMYCIN 250 MG PO TABS Oral Take 250-500 mg by mouth daily. 5 day course of therapy; not completed.     Marland Kitchen CETIRIZINE HCL 10 MG PO TABS Oral Take 10 mg by mouth daily as needed.      . COMBIVENT 18-103 MCG/ACT IN AERO Inhalation Inhale 1-2 puffs into the lungs 4 (four) times daily.     . CYMBALTA 30 MG PO CPEP  Take one tablet by mouth once daily     . FENOFIBRATE 160 MG PO TABS  One tablet by mouth once daily    . FLUTICASONE PROPIONATE 50 MCG/ACT NA SUSP Nasal Place 2 sprays into the nose daily.     Marland Kitchen PANTOPRAZOLE SODIUM 40 MG PO TBEC Oral Take 40 mg by mouth 2 (two) times daily as needed. For stomach upset.       BP 168/84  Pulse 85  Temp(Src) 98.4 F (36.9 C) (Oral)  Resp 18  Ht 5\' 6"  (1.676 m)  Wt 190 lb (86.183 kg)  BMI 30.67 kg/m2  SpO2 98%  Physical Exam  Nursing note and vitals reviewed. Constitutional: She is oriented to person, place, and time. She appears well-developed and well-nourished.  HENT:  Head: Normocephalic and atraumatic.  Eyes: EOM are normal. Pupils are equal, round, and reactive to light.  Neck: Normal range of motion. Neck supple.  Cardiovascular: Normal rate, regular rhythm and  normal heart sounds.   No murmur heard.      Patient has moderate hypertension.  Pulmonary/Chest: Effort normal and breath sounds normal. No respiratory distress. She has no wheezes. She has no rales.  Abdominal: Soft. Bowel sounds are normal. She exhibits no distension. There is no tenderness. There is no rebound and no guarding.  Musculoskeletal: Normal range of motion.  Neurological: She is alert and oriented to person, place, and time. No cranial nerve deficit.  Skin: Skin is warm and dry.  Psychiatric: She has a normal mood and affect. Her speech is normal.    ED Course  Procedures (including critical care time)  Labs Reviewed  BASIC METABOLIC PANEL - Abnormal; Notable for the following:    Calcium 10.6 (*)    GFR calc non Af Amer 64 (*)    GFR calc Af Amer 74 (*)    All other components within normal limits  CBC  DIFFERENTIAL  URINALYSIS, ROUTINE W REFLEX MICROSCOPIC  TROPONIN I   Dg Chest 2 View  10/07/2011  *RADIOLOGY REPORT*  Clinical Data: Hypertension, shortness of breath  CHEST - 2 VIEW  Comparison: 10/17/2009  Findings: Stable heart size.  Mildly prominent central vascularity but no definite edema, CHF, pneumonia, collapse, consolidation, effusion or pneumothorax.  Trachea midline.  IMPRESSION: Negative for CHF or pneumonia.  Original Report Authenticated By: Judie Petit. Ruel Favors, M.D.     1. Hypertension      Date: 10/07/2011  Rate: 81  Rhythm: normal sinus rhythm  QRS Axis: normal  Intervals: normal  ST/T Wave abnormalities: nonspecific ST/T changes  Conduction Disutrbances:none  Narrative Interpretation: lateral changes since previous  Old EKG Reviewed: changes noted     MDM  URI symptoms and hypertension. Some nonspecific EKG changes. The hypertension has maintained despite oral treatment. With the EKG changes she will be admitted to hospital. She's had no chest pain.        Juliet Rude. Rubin Payor, MD 10/07/11 [phone]

## 2011-10-08 ENCOUNTER — Encounter (HOSPITAL_COMMUNITY): Payer: Self-pay | Admitting: *Deleted

## 2011-10-08 LAB — BASIC METABOLIC PANEL
BUN: 14 mg/dL (ref 6–23)
Calcium: 9.8 mg/dL (ref 8.4–10.5)
Creatinine, Ser: 0.91 mg/dL (ref 0.50–1.10)
GFR calc Af Amer: 77 mL/min — ABNORMAL LOW (ref >90)

## 2011-10-08 LAB — CARDIAC PANEL(CRET KIN+CKTOT+MB+TROPI)
CK, MB: 2.6 ng/mL (ref 0.3–4.0)
Relative Index: INVALID (ref 0.0–2.5)
Total CK: 51 U/L (ref 7–177)

## 2011-10-08 LAB — CBC
MCHC: 33.3 g/dL (ref 30.0–36.0)
MCV: 91.2 fL (ref 78.0–100.0)
Platelets: 232 10*3/uL (ref 150–400)
RDW: 13 % (ref 11.5–15.5)
WBC: 8.5 10*3/uL (ref 4.0–10.5)

## 2011-10-08 LAB — TSH: TSH: 3.126 u[IU]/mL (ref 0.350–4.500)

## 2011-10-08 MED ORDER — ACETAMINOPHEN 650 MG RE SUPP: 650.0000 mg | Freq: Four times a day (QID) | RECTAL | Status: DC | PRN

## 2011-10-08 MED ORDER — CLONIDINE HCL 0.2 MG PO TABS
0.2000 mg | ORAL_TABLET | Freq: Two times a day (BID) | ORAL | Status: DC
  Administered 2011-10-08: 0.2 mg via ORAL
  Filled 2011-10-08 (×2): qty 1

## 2011-10-08 MED ORDER — LISINOPRIL 5 MG PO TABS
5.0000 mg | ORAL_TABLET | Freq: Every day | ORAL | Status: DC
  Administered 2011-10-08: 5 mg via ORAL
  Filled 2011-10-08: qty 1

## 2011-10-08 MED ORDER — ONDANSETRON HCL 4 MG/2ML IJ SOLN: 4.0000 mg | Freq: Four times a day (QID) | INTRAMUSCULAR | Status: DC | PRN

## 2011-10-08 MED ORDER — METOPROLOL TARTRATE 1 MG/ML IV SOLN: 5.0000 mg | Freq: Four times a day (QID) | INTRAVENOUS | Status: DC | PRN

## 2011-10-08 MED ORDER — HYDROCHLOROTHIAZIDE 25 MG PO TABS: 25.0000 mg | ORAL_TABLET | Freq: Every day | ORAL | Status: DC

## 2011-10-08 MED ORDER — HYDRALAZINE HCL 20 MG/ML IJ SOLN: 10.0000 mg | Freq: Four times a day (QID) | INTRAMUSCULAR | Status: DC | PRN

## 2011-10-08 MED ORDER — ENOXAPARIN SODIUM 40 MG/0.4ML ~~LOC~~ SOLN
40.0000 mg | SUBCUTANEOUS | Status: DC
  Administered 2011-10-08: 40 mg via SUBCUTANEOUS
  Filled 2011-10-08: qty 0.4

## 2011-10-08 MED ORDER — NICOTINE 21 MG/24HR TD PT24: 1.0000 | MEDICATED_PATCH | Freq: Every day | TRANSDERMAL | Status: AC | PRN

## 2011-10-08 MED ORDER — ISOSORBIDE MONONITRATE ER 30 MG PO TB24: 30.0000 mg | ORAL_TABLET | Freq: Every day | ORAL | Status: DC

## 2011-10-08 MED ORDER — CLONIDINE HCL 0.2 MG PO TABS: 0.2000 mg | ORAL_TABLET | Freq: Two times a day (BID) | ORAL | Status: DC

## 2011-10-08 MED ORDER — LEVALBUTEROL HCL 0.63 MG/3ML IN NEBU
0.6300 mg | INHALATION_SOLUTION | Freq: Four times a day (QID) | RESPIRATORY_TRACT | Status: DC | PRN
  Administered 2011-10-08 (×2): 0.63 mg via RESPIRATORY_TRACT
  Filled 2011-10-08: qty 3

## 2011-10-08 MED ORDER — LISINOPRIL 5 MG PO TABS: 5.0000 mg | ORAL_TABLET | Freq: Every day | ORAL | Status: DC

## 2011-10-08 MED ORDER — ACETAMINOPHEN 325 MG PO TABS: 650.0000 mg | ORAL_TABLET | Freq: Four times a day (QID) | ORAL | Status: DC | PRN

## 2011-10-08 MED ORDER — ONDANSETRON HCL 4 MG PO TABS: 4.0000 mg | ORAL_TABLET | Freq: Four times a day (QID) | ORAL | Status: DC | PRN

## 2011-10-08 MED ORDER — OXYCODONE HCL 5 MG PO TABS: 5.0000 mg | ORAL_TABLET | ORAL | Status: DC | PRN

## 2011-10-08 MED ORDER — ISOSORBIDE MONONITRATE ER 30 MG PO TB24
30.0000 mg | ORAL_TABLET | Freq: Every day | ORAL | Status: DC
  Administered 2011-10-08: 30 mg via ORAL
  Filled 2011-10-08: qty 1

## 2011-10-08 MED ORDER — SODIUM CHLORIDE 0.9 % IJ SOLN
3.0000 mL | Freq: Two times a day (BID) | INTRAMUSCULAR | Status: DC
  Administered 2011-10-08 (×2): 3 mL via INTRAVENOUS

## 2011-10-08 MED ORDER — PANTOPRAZOLE SODIUM 40 MG PO TBEC
40.0000 mg | DELAYED_RELEASE_TABLET | Freq: Once | ORAL | Status: AC
  Administered 2011-10-08: 40 mg via ORAL
  Filled 2011-10-08: qty 1

## 2011-10-08 MED ORDER — SODIUM CHLORIDE 0.9 % IJ SOLN: 3.0000 mL | INTRAMUSCULAR | Status: DC | PRN

## 2011-10-08 MED ORDER — SODIUM CHLORIDE 0.9 % IV SOLN: 250.0000 mL | INTRAVENOUS | Status: DC | PRN

## 2011-10-08 MED ORDER — NICOTINE 21 MG/24HR TD PT24
21.0000 mg | MEDICATED_PATCH | Freq: Every day | TRANSDERMAL | Status: DC
  Filled 2011-10-08: qty 1

## 2011-10-08 MED ORDER — ATENOLOL 50 MG PO TABS: 50.0000 mg | ORAL_TABLET | Freq: Every day | ORAL | Status: DC

## 2011-10-08 MED ORDER — HYDROCHLOROTHIAZIDE 25 MG PO TABS
25.0000 mg | ORAL_TABLET | Freq: Every day | ORAL | Status: DC
  Administered 2011-10-08: 25 mg via ORAL
  Filled 2011-10-08: qty 1

## 2011-10-08 NOTE — Progress Notes (Signed)
Patient seen and examined. Chart reviewed. Agree with Toya Smothers, NP. Blood pressure better controlled. Wants to go home today.  Shontia Gillooly A, MD 10/08/2011, 12:51 PM

## 2011-10-08 NOTE — ED Notes (Signed)
Respiratory requested to give pt nebulizer prior to transport to floor, respiratory in room at this time, pt's daughter at bedside, report given to floor rn, pt with no other complaints or needs at this time, bp wnl,

## 2011-10-08 NOTE — Progress Notes (Signed)
Subjective: "feeling much better and ready to go home". Denies pain/discomfort  Objective: Vital signs Filed Vitals:   10/08/11 0100 10/08/11 0142 10/08/11 0146 10/08/11 0535  BP:   133/60 155/82  Pulse:   79 76  Temp:   97.3 F (36.3 C) 97.6 F (36.4 C)  TempSrc:   Oral Oral  Resp:   18 16  Height:   5\' 5"  (1.651 m)   Weight:   82.1 kg (181 lb)   SpO2: 100% 96% 96% 99%   Weight change:  Last BM Date: 10/08/11  Intake/Output from previous day:       Physical Exam: General: Alert, awake, oriented x3, in no acute distress. HEENT: No bruits, no goiter. PERRL, EOMI Heart: Regular rate and rhythm, without murmurs, rubs, gallops. Lungs: Normal effort. Faint expiratory wheeze. Otherwise clear to auscultation bilaterally. Abdomen: Soft, nontender, nondistended, positive bowel sounds. Extremities: No clubbing cyanosis or edema with positive pedal pulses. Neuro: Grossly intact, nonfocal.    Lab Results: Basic Metabolic Panel:  Basename 10/08/11 0500 10/07/11 1650  NA 135 135  K 4.0 4.2  CL 103 102  CO2 25 25  GLUCOSE 102* 95  BUN 14 14  CREATININE 0.91 0.94  CALCIUM 9.8 10.6*  MG -- --  PHOS -- --   Liver Function Tests: No results found for this basename: AST:2,ALT:2,ALKPHOS:2,BILITOT:2,PROT:2,ALBUMIN:2 in the last 72 hours No results found for this basename: LIPASE:2,AMYLASE:2 in the last 72 hours No results found for this basename: AMMONIA:2 in the last 72 hours CBC:  Basename 10/08/11 0500 10/07/11 1650  WBC 8.5 9.5  NEUTROABS -- 6.2  HGB 12.1 14.5  HCT 36.3 43.1  MCV 91.2 91.5  PLT 232 284   Cardiac Enzymes:  Basename 10/08/11 0820 10/07/11 1650  CKTOTAL 51 --  CKMB 2.6 --  CKMBINDEX -- --  TROPONINI <0.30 <0.30   BNP: No components found with this basename: POCBNP:3 D-Dimer: No results found for this basename: DDIMER:2 in the last 72 hours CBG: No results found for this basename: GLUCAP:6 in the last 72 hours Hemoglobin A1C: No results  found for this basename: HGBA1C in the last 72 hours Fasting Lipid Panel: No results found for this basename: CHOL,HDL,LDLCALC,TRIG,CHOLHDL,LDLDIRECT in the last 72 hours Thyroid Function Tests: No results found for this basename: TSH,T4TOTAL,FREET4,T3FREE,THYROIDAB in the last 72 hours Anemia Panel: No results found for this basename: VITAMINB12,FOLATE,FERRITIN,TIBC,IRON,RETICCTPCT in the last 72 hours Coagulation: No results found for this basename: LABPROT:2,INR:2 in the last 72 hours Urine Drug Screen: Drugs of Abuse  No results found for this basename: labopia, cocainscrnur, labbenz, amphetmu, thcu, labbarb    Alcohol Level: No results found for this basename: ETH:2 in the last 72 hours Urinalysis:  Misc. Labs:  No results found for this or any previous visit (from the past 240 hour(s)).  Studies/Results: Dg Chest 2 View  10/07/2011  *RADIOLOGY REPORT*  Clinical Data: Hypertension, shortness of breath  CHEST - 2 VIEW  Comparison: 10/17/2009  Findings: Stable heart size.  Mildly prominent central vascularity but no definite edema, CHF, pneumonia, collapse, consolidation, effusion or pneumothorax.  Trachea midline.  IMPRESSION: Negative for CHF or pneumonia.  Original Report Authenticated By: Judie Petit. Ruel Favors, M.D.    Medications: Scheduled Meds:   . ALPRAZolam  0.25 mg Oral Once  . atenolol  50 mg Oral Daily  . cloNIDine  0.1 mg Oral Once  . cloNIDine  0.2 mg Oral NOW  . cloNIDine  0.2 mg Oral BID  . DULoxetine  30 mg  Oral QHS  . enoxaparin  40 mg Subcutaneous Q24H  . fenofibrate  160 mg Oral Daily  . fluticasone  1 spray Each Nare Daily  . hydrALAZINE  10 mg Intravenous Once  . hydrochlorothiazide  25 mg Oral Daily  . isosorbide mononitrate  30 mg Oral Daily  . labetalol  20 mg Intravenous Once  . lisinopril  5 mg Oral Daily  . loratadine  10 mg Oral Daily  . metoprolol  5 mg Intravenous NOW  . nicotine  21 mg Transdermal Daily  . pantoprazole  40 mg Oral Once  .  sodium chloride  3 mL Intravenous Q12H   Continuous Infusions:   . DISCONTD: sodium chloride Stopped (10/07/11 2155)   PRN Meds:.sodium chloride, acetaminophen, acetaminophen, ALPRAZolam, hydrALAZINE, levalbuterol, metoprolol, ondansetron (ZOFRAN) IV, ondansetron, oxyCODONE, sodium chloride  Assessment/Plan:  Principal Problem: 1. *HTN (hypertension), malignantt: Likely related to uncontrolled HTN from non-compliance. Much improved on meds.  At this time no signs of any end organ damage such as the heart. Have reviewed history thoroughly and find no evidence of preceding event such as TIA or strokelike symptoms.  Will continue her beta blocker and add ACE inhibitor, Imdur were, HCTZ and clonidine.   Active Problems: 2. Esophageal reflux: continue PPI 3. COPD (chronic obstructive pulmonary disease): stable.  4. Tobacco abuse: counseled and provided with nicotine patch. 5. Hyperlipidemia: continue statin 6. Anxiety disorder: stable at baseline    LOS: 1 day   [facility] M 10/08/2011, 9:58 AM

## 2011-10-08 NOTE — Discharge Planning (Addendum)
Physician Discharge Summary  Patient ID: [facility] MRN: [id number] DOB/AGE: [birth year] 61 y.o.  Admit date: 10/07/2011 Discharge date: 10/08/2011  Primary Care Physician:  Daisy Floro, MD   Discharge Diagnoses:    Present on Admission:  .Tobacco abuse .Esophageal reflux .COPD (chronic obstructive pulmonary disease) .HTN (hypertension), malignant .Hyperlipidemia .Anxiety disorder  Current Discharge Medication List    START taking these medications   Details  cloNIDine (CATAPRES) 0.2 MG tablet Take 1 tablet (0.2 mg total) by mouth 2 (two) times daily. Qty: 60 tablet, Refills: 0    hydrochlorothiazide (HYDRODIURIL) 25 MG tablet Take 1 tablet (25 mg total) by mouth daily. Qty: 30 tablet, Refills: 0    isosorbide mononitrate (IMDUR) 30 MG 24 hr tablet Take 1 tablet (30 mg total) by mouth daily. Qty: 30 tablet, Refills: 0    lisinopril (PRINIVIL,ZESTRIL) 5 MG tablet Take 1 tablet (5 mg total) by mouth daily. Qty: 30 tablet, Refills: 0    nicotine (NICODERM CQ - DOSED IN MG/24 HOURS) 21 mg/24hr patch Place 1 patch (21 patches total) onto the skin daily as needed (For smoking cessation. Please do not smoke while on the patch.). Qty: 28 patch, Refills: 0      CONTINUE these medications which have CHANGED   Details  atenolol (TENORMIN) 50 MG tablet Take 1 tablet (50 mg total) by mouth daily. Qty: 30 tablet, Refills: 0      CONTINUE these medications which have NOT CHANGED   Details  ALPRAZolam (XANAX) 0.25 MG tablet Take 0.375 mg by mouth at bedtime as needed. For sleep.    azithromycin (ZITHROMAX) 250 MG tablet Take 250-500 mg by mouth daily. 5 day course of therapy; not completed.     cetirizine (ZYRTEC) 10 MG tablet Take 10 mg by mouth daily as needed.      COMBIVENT 18-103 MCG/ACT inhaler Inhale 1-2 puffs into the lungs 4 (four) times daily.     CYMBALTA 30 MG capsule Take one tablet by mouth once daily     fenofibrate 160 MG tablet One tablet by mouth  once daily    fluticasone (FLONASE) 50 MCG/ACT nasal spray Place 2 sprays into the nose daily.     pantoprazole (PROTONIX) 40 MG tablet Take 40 mg by mouth 2 (two) times daily as needed. For stomach upset.          Disposition and Follow-up: Pt is medically stable and ready for discharge to home. Will follow up with Dr. Tenny Craw in 1 week.  Consults:  none  Significant Diagnostic Studies:  No results found.  Labs Reviewed  BASIC METABOLIC PANEL - Abnormal; Notable for the following:    Calcium 10.6 (*)    GFR calc non Af Amer 64 (*)    GFR calc Af Amer 74 (*)    All other components within normal limits  BASIC METABOLIC PANEL - Abnormal; Notable for the following:    Glucose, Bld 102 (*)    GFR calc non Af Amer 67 (*)    GFR calc Af Amer 77 (*)    All other components within normal limits  CBC  DIFFERENTIAL  URINALYSIS, ROUTINE W REFLEX MICROSCOPIC  TROPONIN I  CARDIAC PANEL(CRET KIN+CKTOT+MB+TROPI)  CBC  CARDIAC PANEL(CRET KIN+CKTOT+MB+TROPI)  CARDIAC PANEL(CRET KIN+CKTOT+MB+TROPI)  TSH        Dg Chest 2 View  10/07/2011  *RADIOLOGY REPORT*  Clinical Data: Hypertension, shortness of breath  CHEST - 2 VIEW  Comparison: 10/17/2009  Findings: Stable heart size.  Mildly prominent  central vascularity but no definite edema, CHF, pneumonia, collapse, consolidation, effusion or pneumothorax.  Trachea midline.  IMPRESSION: Negative for CHF or pneumonia.  Original Report Authenticated By: Judie Petit. Ruel Favors, M.D.       Brief H and P: For complete details please refer to admission H and P, but in brief   Patient is a 60 year old white female past history COPD, tobacco abuse, hyperlipidemia and high blood pressure which has not been well-controlled who for the past 2 days is complaining of upper respiratory symptoms. Her PCP was not available so she went to the [place] walk in clinic. Patient normally just on a beta blocker and she was given a diuretic at the walk-in clinic, but did  not take this because she was not sure her PCP would want her to do so. The patient's daughter, who is a former Engineer, civil (consulting) from [facility] [employer], check the patient's blood pressure today when she was complaining of feeling weak and found her blood pressure to be in a systolic 200s. Patient was then brought into the emergency room.  In the emergency room, her EKG was unremarkable. Cardiac markers were unremarkable. Lab work was unremarkable. Her blood pressure however remains persistently elevated, even after a dose of IV labetalol and by mouth clonidine. Hospitalists were called for evaluation and admission.      Hospital Course:  No resolved problems to display.  Active Hospital Problems  Diagnoses Date Noted   . HTN (hypertension), malignant 10/07/2011   . Tobacco abuse 10/07/2011   . Hyperlipidemia 10/07/2011   . Anxiety disorder 10/07/2011   . Esophageal reflux 05/13/2011   . COPD (chronic obstructive pulmonary disease) 05/13/2011     Resolved Hospital Problems  Diagnoses Date Noted Date Resolved    Principal Problem: 1. *HTN (hypertension), malignantt: Likely related to uncontrolled HTN from non-compliance. Pt admitted to telemetry unit. Started on  Imdur, HCTZ and clonidine in addition to her BB and ACE inhibitor. At time of discharge controlled. Will monitor BP at home and keep a log of pressures to take to PCP in 1 week for evaluation. Has been educated to signs of low bld pressure and given parameters for calling MD. Active Problems:  2. Esophageal reflux: continue PPI  3. COPD (chronic obstructive pulmonary disease): stable.  4. Tobacco abuse: counseled and provided with nicotine patch.  5. Hyperlipidemia: continue statin  6. Anxiety disorder: stable at baseline    Time spent on Discharge: 35 min  Signed: Gwenyth Bender 10/08/2011, 10:06 AM  Patient seen and examined. Chart reviewed. Agree with Toya Smothers, NP. Followup as indicated below. Patient also indicated that she will get  a blood pressure machine and check her blood pressure daily. If SBP is less than 105 consistently then patient was indicated to call her PCP for further directions.  Discharge Orders    Future Orders  Please Complete By  Expires    Diet - low sodium heart healthy      Increase activity slowly      Discharge instructions      Comments:    Followup with Daisy Floro, MD (PCP) in 1 week.  Please check your blood pressure at home and discuss the blood pressure readings with your PCP. Call your PCP if your systolic blood pressure is below 105 consistently.      Reynalda Canny A, MD  10/08/2011, 12:52 PM

## 2011-10-08 NOTE — Discharge Planning (Signed)
Patient seen and examined. Chart reviewed. Agree with Toya Smothers, NP. Followup as indicated below. Patient also indicated that she will get a blood pressure machine and check her blood pressure daily. If SBP is less than 105 consistently then patient was indicated to call her PCP for further directions.  Discharge Orders    Future Orders Please Complete By Expires   Diet - low sodium heart healthy      Increase activity slowly      Discharge instructions      Comments:   Followup with Daisy Floro, MD (PCP) in 1 week. Please check your blood pressure at home and discuss the blood pressure readings with your PCP. Call your PCP if your systolic blood pressure is below 105 consistently.      Bretta Fees A, MD 10/08/2011, 12:52 PM

## 2011-10-30 ENCOUNTER — Other Ambulatory Visit: Payer: Self-pay | Admitting: Family Medicine

## 2011-10-30 DIAGNOSIS — Z1231 Encounter for screening mammogram for malignant neoplasm of breast: Secondary | ICD-10-CM

## 2011-11-11 ENCOUNTER — Ambulatory Visit
Admission: RE | Admit: 2011-11-11 | Discharge: 2011-11-11 | Disposition: A | Discharge status: Home / Self Care | Payer: BC Managed Care – PPO | Source: Ambulatory Visit | Attending: Family Medicine | Admitting: Family Medicine

## 2011-11-11 DIAGNOSIS — Z1231 Encounter for screening mammogram for malignant neoplasm of breast: Secondary | ICD-10-CM

## 2013-12-05 ENCOUNTER — Encounter (HOSPITAL_COMMUNITY): Payer: Self-pay | Admitting: Emergency Medicine

## 2013-12-05 ENCOUNTER — Emergency Department (HOSPITAL_COMMUNITY)
Admission: EM | Admit: 2013-12-05 | Discharge: 2013-12-06 | Disposition: A | Discharge status: Home / Self Care | Payer: No Typology Code available for payment source | Attending: Emergency Medicine | Admitting: Emergency Medicine

## 2013-12-05 DIAGNOSIS — IMO0002 Reserved for concepts with insufficient information to code with codable children: Secondary | ICD-10-CM | POA: Insufficient documentation

## 2013-12-05 DIAGNOSIS — F17200 Nicotine dependence, unspecified, uncomplicated: Secondary | ICD-10-CM | POA: Insufficient documentation

## 2013-12-05 DIAGNOSIS — K219 Gastro-esophageal reflux disease without esophagitis: Secondary | ICD-10-CM | POA: Insufficient documentation

## 2013-12-05 DIAGNOSIS — R000 Tachycardia, unspecified: Secondary | ICD-10-CM | POA: Insufficient documentation

## 2013-12-05 DIAGNOSIS — Z79899 Other long term (current) drug therapy: Secondary | ICD-10-CM | POA: Insufficient documentation

## 2013-12-05 DIAGNOSIS — I10 Essential (primary) hypertension: Secondary | ICD-10-CM | POA: Insufficient documentation

## 2013-12-05 DIAGNOSIS — Z792 Long term (current) use of antibiotics: Secondary | ICD-10-CM | POA: Insufficient documentation

## 2013-12-05 DIAGNOSIS — Z8669 Personal history of other diseases of the nervous system and sense organs: Secondary | ICD-10-CM | POA: Insufficient documentation

## 2013-12-05 DIAGNOSIS — F329 Major depressive disorder, single episode, unspecified: Secondary | ICD-10-CM | POA: Insufficient documentation

## 2013-12-05 DIAGNOSIS — R197 Diarrhea, unspecified: Secondary | ICD-10-CM | POA: Insufficient documentation

## 2013-12-05 DIAGNOSIS — F411 Generalized anxiety disorder: Secondary | ICD-10-CM | POA: Insufficient documentation

## 2013-12-05 DIAGNOSIS — Z9089 Acquired absence of other organs: Secondary | ICD-10-CM | POA: Insufficient documentation

## 2013-12-05 DIAGNOSIS — F3289 Other specified depressive episodes: Secondary | ICD-10-CM | POA: Insufficient documentation

## 2013-12-05 DIAGNOSIS — E785 Hyperlipidemia, unspecified: Secondary | ICD-10-CM | POA: Insufficient documentation

## 2013-12-05 DIAGNOSIS — E871 Hypo-osmolality and hyponatremia: Secondary | ICD-10-CM | POA: Insufficient documentation

## 2013-12-05 DIAGNOSIS — R1110 Vomiting, unspecified: Secondary | ICD-10-CM | POA: Insufficient documentation

## 2013-12-05 DIAGNOSIS — J441 Chronic obstructive pulmonary disease with (acute) exacerbation: Secondary | ICD-10-CM | POA: Insufficient documentation

## 2013-12-05 LAB — CBC WITH DIFFERENTIAL/PLATELET
Basophils Absolute: 0 10*3/uL (ref 0.0–0.1)
Basophils Relative: 1 % (ref 0–1)
Eosinophils Absolute: 0.1 10*3/uL (ref 0.0–0.7)
Eosinophils Relative: 2 % (ref 0–5)
HEMATOCRIT: 33.4 % — AB (ref 36.0–46.0)
HEMOGLOBIN: 12.1 g/dL (ref 12.0–15.0)
LYMPHS PCT: 23 % (ref 12–46)
Lymphs Abs: 1.2 10*3/uL (ref 0.7–4.0)
MCH: 31.9 pg (ref 26.0–34.0)
MCHC: 36.2 g/dL — ABNORMAL HIGH (ref 30.0–36.0)
MCV: 88.1 fL (ref 78.0–100.0)
MONO ABS: 0.7 10*3/uL (ref 0.1–1.0)
MONOS PCT: 12 % (ref 3–12)
NEUTROS ABS: 3.3 10*3/uL (ref 1.7–7.7)
NEUTROS PCT: 62 % (ref 43–77)
Platelets: 250 10*3/uL (ref 150–400)
RBC: 3.79 MIL/uL — AB (ref 3.87–5.11)
RDW: 12.1 % (ref 11.5–15.5)
WBC: 5.3 10*3/uL (ref 4.0–10.5)

## 2013-12-05 LAB — COMPREHENSIVE METABOLIC PANEL
ALBUMIN: 4.1 g/dL (ref 3.5–5.2)
ALK PHOS: 43 U/L (ref 39–117)
ALT: 21 U/L (ref 0–35)
AST: 27 U/L (ref 0–37)
BILIRUBIN TOTAL: 0.4 mg/dL (ref 0.3–1.2)
BUN: 7 mg/dL (ref 6–23)
CHLORIDE: 86 meq/L — AB (ref 96–112)
CO2: 22 meq/L (ref 19–32)
CREATININE: 0.82 mg/dL (ref 0.50–1.10)
Calcium: 9.4 mg/dL (ref 8.4–10.5)
GFR calc Af Amer: 86 mL/min — ABNORMAL LOW (ref >90)
GFR, EST NON AFRICAN AMERICAN: 74 mL/min — AB (ref >90)
Glucose, Bld: 127 mg/dL — ABNORMAL HIGH (ref 70–99)
POTASSIUM: 3.2 meq/L — AB (ref 3.7–5.3)
Sodium: 124 mEq/L — ABNORMAL LOW (ref 137–147)
Total Protein: 7.8 g/dL (ref 6.0–8.3)

## 2013-12-05 MED ORDER — LOPERAMIDE HCL 2 MG PO CAPS
4.0000 mg | ORAL_CAPSULE | ORAL | Status: DC | PRN
  Administered 2013-12-05: 4 mg via ORAL
  Filled 2013-12-05: qty 2

## 2013-12-05 MED ORDER — SODIUM CHLORIDE 0.9 % IV BOLUS (SEPSIS)
2000.0000 mL | Freq: Once | INTRAVENOUS | Status: AC
  Administered 2013-12-05: 2000 mL via INTRAVENOUS

## 2013-12-05 NOTE — ED Provider Notes (Addendum)
CSN: [id number]     Arrival date & time 12/05/13  2038 History   First MD Initiated Contact with Patient 12/05/13 2154     Chief Complaint  Patient presents with  . Diarrhea     (Consider location/radiation/quality/duration/timing/severity/associated sxs/prior Treatment) Patient is a 63 y.o. female presenting with diarrhea. The history is provided by the patient.  Diarrhea Quality:  Watery Severity:  Severe Onset quality:  Sudden Number of episodes:  Between 10 and 20 episodes a day Duration:  4 days Timing:  Intermittent Progression:  Unchanged Relieved by:  Nothing Exacerbated by: eating and drinking. Ineffective treatments:  None tried Associated symptoms: vomiting   Associated symptoms comment:  Cramping with episodes of diarrhea but no current pain Vomiting:    Quality:  Stomach contents   Severity:  Severe   Duration:  1 day   Timing:  Constant   Progression:  Resolved Risk factors: no recent antibiotic use, no sick contacts, no suspicious food intake and no travel to endemic areas     Past Medical History  Diagnosis Date  . IBS (irritable bowel syndrome)   . Anxiety   . GERD (gastroesophageal reflux disease)   . Depression   . Diverticulosis   . Hyperlipemia   . Hypertension   . Mitral valve prolapse   . Colitis   . COPD (chronic obstructive pulmonary disease)     no o2  . Sciatic nerve disease    Past Surgical History  Procedure Laterality Date  . Cholecystectomy     Family History  Problem Relation Age of Onset  . Colon cancer Paternal Aunt   . Breast cancer Mother   . Ovarian cancer Mother   . Pancreatic cancer Maternal Grandmother   . Diabetes Maternal Aunt   . Heart disease Paternal Grandfather   . Kidney disease Maternal Aunt    History  Substance Use Topics  . Smoking status: Current Every Day Smoker -- 1.00 packs/day for 40 years    Types: Cigarettes  . Smokeless tobacco: Never Used  . Alcohol Use: No   OB History   Grav Para Term  Preterm Abortions TAB SAB Ect Mult Living                 Review of Systems  Gastrointestinal: Positive for vomiting and diarrhea.  All other systems reviewed and are negative.      Allergies  Amoxicillin  Home Medications   Current Outpatient Rx  Name  Route  Sig  Dispense  Refill  . ALPRAZolam (XANAX) 0.25 MG tablet   Oral   Take 0.375 mg by mouth at bedtime as needed. For sleep.         Marland Kitchen atenolol (TENORMIN) 50 MG tablet   Oral   Take 1 tablet (50 mg total) by mouth daily.   30 tablet   0   . azithromycin (ZITHROMAX) 250 MG tablet   Oral   Take 250-500 mg by mouth daily. 5 day course of therapy; not completed.          . cetirizine (ZYRTEC) 10 MG tablet   Oral   Take 10 mg by mouth daily as needed.           Marland Kitchen EXPIRED: cloNIDine (CATAPRES) 0.2 MG tablet   Oral   Take 1 tablet (0.2 mg total) by mouth 2 (two) times daily.   60 tablet   0   . COMBIVENT 18-103 MCG/ACT inhaler   Inhalation   Inhale 1-2 puffs into  the lungs 4 (four) times daily.          . CYMBALTA 30 MG capsule      Take one tablet by mouth once daily          . fenofibrate 160 MG tablet      One tablet by mouth once daily         . fluticasone (FLONASE) 50 MCG/ACT nasal spray   Nasal   Place 2 sprays into the nose daily.          Marland Kitchen EXPIRED: hydrochlorothiazide (HYDRODIURIL) 25 MG tablet   Oral   Take 1 tablet (25 mg total) by mouth daily.   30 tablet   0   . EXPIRED: isosorbide mononitrate (IMDUR) 30 MG 24 hr tablet   Oral   Take 1 tablet (30 mg total) by mouth daily.   30 tablet   0   . EXPIRED: lisinopril (PRINIVIL,ZESTRIL) 5 MG tablet   Oral   Take 1 tablet (5 mg total) by mouth daily.   30 tablet   0   . pantoprazole (PROTONIX) 40 MG tablet   Oral   Take 40 mg by mouth 2 (two) times daily as needed. For stomach upset.           BP 156/89  Pulse 105  Temp(Src) 97.9 F (36.6 C) (Oral)  Resp 18  Ht 5\' 4"  (1.626 m)  Wt 180 lb (81.647 kg)  BMI  30.88 kg/m2  SpO2 98% Physical Exam  Nursing note and vitals reviewed. Constitutional: She is oriented to person, place, and time. She appears well-developed and well-nourished. No distress.  HENT:  Head: Normocephalic and atraumatic.  Mouth/Throat: Oropharynx is clear and moist. Mucous membranes are dry.  Eyes: Conjunctivae and EOM are normal. Pupils are equal, round, and reactive to light.  Neck: Normal range of motion. Neck supple.  Cardiovascular: Regular rhythm and intact distal pulses.  Tachycardia present.   No murmur heard. Pulmonary/Chest: Effort normal. No respiratory distress. She has wheezes. She has no rales.  Scant wheezes in upper lung fields  Abdominal: Soft. She exhibits no distension. There is no tenderness. There is no rebound and no guarding.  Musculoskeletal: Normal range of motion. She exhibits no edema and no tenderness.  Neurological: She is alert and oriented to person, place, and time.  Skin: Skin is warm and dry. No rash noted. No erythema.  Psychiatric: She has a normal mood and affect. Her behavior is normal.    ED Course  Procedures (including critical care time) Labs Review Labs Reviewed  CBC WITH DIFFERENTIAL - Abnormal; Notable for the following:    RBC 3.79 (*)    HCT 33.4 (*)    MCHC 36.2 (*)    All other components within normal limits  COMPREHENSIVE METABOLIC PANEL - Abnormal; Notable for the following:    Sodium 124 (*)    Potassium 3.2 (*)    Chloride 86 (*)    Glucose, Bld 127 (*)    GFR calc non Af Amer 74 (*)    GFR calc Af Amer 86 (*)    All other components within normal limits   Imaging Review No results found.  EKG Interpretation   None       MDM   Final diagnoses:  Diarrhea  Hyponatremia     Pt with symptoms most consistent with a viral process with fever/vomitting/diarrhea 5 days ago but now lingering diarrhea.  Pt well appearing and only abd pain is cramping  before episode of diarrhea.  Pt with hx of IBS but no  other pathologic abd processes.  Denies bad food exposure and recent travel out of the country.  No recent abx.  No hx concerning for GU pathology or kidney stones.  Pt is awake and alert on exam without peritoneal signs. Patient appears dehydrated and mildly tachycardic. We'll treat with IV fluids and Imodium.  11:18 PM Labs show a hyponatremia but o/w normal labs.  Most likely from diarrhea.  After fluids pt feeling better will have pt f/u with pCP this weekfor repeat check of sodium.  Blanchie Dessert, MD 12/05/13 4818  Blanchie Dessert, MD 12/05/13 2328

## 2013-12-05 NOTE — ED Notes (Signed)
t arrived to the Ed with a complaint of diarrhea and abdominal pain.  Pt has had this since Thursday.  Pt has had episodes of emesis as well but they stopped after the first.  Pt states she gets bloated then has to have diarrhea. Pt states he has had multiple episodes of diarrhea in the last 24 hours.

## 2013-12-19 DIAGNOSIS — I639 Cerebral infarction, unspecified: Secondary | ICD-10-CM

## 2013-12-19 HISTORY — DX: Cerebral infarction, unspecified: I63.9

## 2014-01-02 ENCOUNTER — Other Ambulatory Visit: Payer: Self-pay

## 2014-01-02 ENCOUNTER — Other Ambulatory Visit: Payer: Self-pay | Admitting: Emergency Medicine

## 2014-01-02 ENCOUNTER — Ambulatory Visit (INDEPENDENT_AMBULATORY_CARE_PROVIDER_SITE_OTHER): Payer: No Typology Code available for payment source | Admitting: Emergency Medicine

## 2014-01-02 ENCOUNTER — Ambulatory Visit (HOSPITAL_COMMUNITY)
Admission: RE | Admit: 2014-01-02 | Discharge: 2014-01-02 | Disposition: A | Discharge status: Home / Self Care | Payer: No Typology Code available for payment source | Source: Ambulatory Visit | Attending: Emergency Medicine | Admitting: Emergency Medicine

## 2014-01-02 ENCOUNTER — Encounter (HOSPITAL_COMMUNITY): Payer: Self-pay | Admitting: Emergency Medicine

## 2014-01-02 ENCOUNTER — Inpatient Hospital Stay (HOSPITAL_COMMUNITY)
Admission: EM | Admit: 2014-01-02 | Discharge: 2014-01-07 | DRG: 065 | Disposition: A | Discharge status: Home / Self Care | Payer: No Typology Code available for payment source | Attending: Internal Medicine | Admitting: Internal Medicine

## 2014-01-02 VITALS — BP 144/78 | HR 90 | Temp 98.4°F | Resp 18 | Ht 66.0 in | Wt 193.0 lb

## 2014-01-02 DIAGNOSIS — Z720 Tobacco use: Secondary | ICD-10-CM | POA: Diagnosis present

## 2014-01-02 DIAGNOSIS — R29898 Other symptoms and signs involving the musculoskeletal system: Secondary | ICD-10-CM | POA: Insufficient documentation

## 2014-01-02 DIAGNOSIS — F411 Generalized anxiety disorder: Secondary | ICD-10-CM | POA: Diagnosis present

## 2014-01-02 DIAGNOSIS — R29810 Facial weakness: Secondary | ICD-10-CM | POA: Insufficient documentation

## 2014-01-02 DIAGNOSIS — G8330 Monoplegia, unspecified affecting unspecified side: Secondary | ICD-10-CM

## 2014-01-02 DIAGNOSIS — I6350 Cerebral infarction due to unspecified occlusion or stenosis of unspecified cerebral artery: Principal | ICD-10-CM | POA: Diagnosis present

## 2014-01-02 DIAGNOSIS — J441 Chronic obstructive pulmonary disease with (acute) exacerbation: Secondary | ICD-10-CM | POA: Diagnosis present

## 2014-01-02 DIAGNOSIS — E785 Hyperlipidemia, unspecified: Secondary | ICD-10-CM | POA: Diagnosis present

## 2014-01-02 DIAGNOSIS — R1310 Dysphagia, unspecified: Secondary | ICD-10-CM

## 2014-01-02 DIAGNOSIS — I059 Rheumatic mitral valve disease, unspecified: Secondary | ICD-10-CM | POA: Diagnosis present

## 2014-01-02 DIAGNOSIS — K219 Gastro-esophageal reflux disease without esophagitis: Secondary | ICD-10-CM | POA: Diagnosis present

## 2014-01-02 DIAGNOSIS — R209 Unspecified disturbances of skin sensation: Secondary | ICD-10-CM | POA: Insufficient documentation

## 2014-01-02 DIAGNOSIS — F17200 Nicotine dependence, unspecified, uncomplicated: Secondary | ICD-10-CM | POA: Diagnosis present

## 2014-01-02 DIAGNOSIS — F3289 Other specified depressive episodes: Secondary | ICD-10-CM | POA: Diagnosis present

## 2014-01-02 DIAGNOSIS — I639 Cerebral infarction, unspecified: Secondary | ICD-10-CM | POA: Diagnosis present

## 2014-01-02 DIAGNOSIS — E236 Other disorders of pituitary gland: Secondary | ICD-10-CM | POA: Diagnosis present

## 2014-01-02 DIAGNOSIS — J449 Chronic obstructive pulmonary disease, unspecified: Secondary | ICD-10-CM

## 2014-01-02 DIAGNOSIS — I10 Essential (primary) hypertension: Secondary | ICD-10-CM

## 2014-01-02 DIAGNOSIS — G8190 Hemiplegia, unspecified affecting unspecified side: Secondary | ICD-10-CM | POA: Diagnosis present

## 2014-01-02 DIAGNOSIS — Z86010 Personal history of colonic polyps: Secondary | ICD-10-CM

## 2014-01-02 DIAGNOSIS — E871 Hypo-osmolality and hyponatremia: Secondary | ICD-10-CM

## 2014-01-02 DIAGNOSIS — F329 Major depressive disorder, single episode, unspecified: Secondary | ICD-10-CM | POA: Diagnosis present

## 2014-01-02 DIAGNOSIS — K589 Irritable bowel syndrome without diarrhea: Secondary | ICD-10-CM

## 2014-01-02 DIAGNOSIS — I6529 Occlusion and stenosis of unspecified carotid artery: Secondary | ICD-10-CM | POA: Diagnosis present

## 2014-01-02 DIAGNOSIS — F419 Anxiety disorder, unspecified: Secondary | ICD-10-CM

## 2014-01-02 DIAGNOSIS — Z79899 Other long term (current) drug therapy: Secondary | ICD-10-CM

## 2014-01-02 DIAGNOSIS — E669 Obesity, unspecified: Secondary | ICD-10-CM | POA: Diagnosis present

## 2014-01-02 DIAGNOSIS — E782 Mixed hyperlipidemia: Secondary | ICD-10-CM

## 2014-01-02 LAB — DIFFERENTIAL
BASOS PCT: 1 % (ref 0–1)
Basophils Absolute: 0.1 10*3/uL (ref 0.0–0.1)
EOS ABS: 0.1 10*3/uL (ref 0.0–0.7)
EOS PCT: 1 % (ref 0–5)
LYMPHS ABS: 1.6 10*3/uL (ref 0.7–4.0)
Lymphocytes Relative: 19 % (ref 12–46)
MONOS PCT: 8 % (ref 3–12)
Monocytes Absolute: 0.7 10*3/uL (ref 0.1–1.0)
NEUTROS PCT: 72 % (ref 43–77)
Neutro Abs: 6.2 10*3/uL (ref 1.7–7.7)

## 2014-01-02 LAB — LIPID PANEL
Cholesterol: 158 mg/dL (ref 0–200)
HDL: 47 mg/dL (ref >39)
LDL Cholesterol: 91 mg/dL (ref 0–99)
Total CHOL/HDL Ratio: 3.4 RATIO
Triglycerides: 99 mg/dL (ref <150)
VLDL: 20 mg/dL (ref 0–40)

## 2014-01-02 LAB — I-STAT CHEM 8, ED
BUN: 14 mg/dL (ref 6–23)
CALCIUM ION: 1.22 mmol/L (ref 1.13–1.30)
Chloride: 95 mEq/L — ABNORMAL LOW (ref 96–112)
Creatinine, Ser: 1.1 mg/dL (ref 0.50–1.10)
GLUCOSE: 119 mg/dL — AB (ref 70–99)
HEMATOCRIT: 40 % (ref 36.0–46.0)
HEMOGLOBIN: 13.6 g/dL (ref 12.0–15.0)
Potassium: 4.2 mEq/L (ref 3.7–5.3)
Sodium: 130 mEq/L — ABNORMAL LOW (ref 137–147)
TCO2: 22 mmol/L (ref 0–100)

## 2014-01-02 LAB — PROTIME-INR
INR: 0.95 (ref 0.00–1.49)
Prothrombin Time: 12.5 seconds (ref 11.6–15.2)

## 2014-01-02 LAB — CBC
HCT: 35.1 % — ABNORMAL LOW (ref 36.0–46.0)
Hemoglobin: 12.3 g/dL (ref 12.0–15.0)
MCH: 31.9 pg (ref 26.0–34.0)
MCHC: 35 g/dL (ref 30.0–36.0)
MCV: 90.9 fL (ref 78.0–100.0)
Platelets: 305 10*3/uL (ref 150–400)
RBC: 3.86 MIL/uL — AB (ref 3.87–5.11)
RDW: 12.2 % (ref 11.5–15.5)
WBC: 8.6 10*3/uL (ref 4.0–10.5)

## 2014-01-02 LAB — ETHANOL

## 2014-01-02 LAB — POCT CBC
Granulocyte percent: 60.4 %G (ref 37–80)
HEMATOCRIT: 38.9 % (ref 37.7–47.9)
HEMOGLOBIN: 12.6 g/dL (ref 12.2–16.2)
LYMPH, POC: 2.2 (ref 0.6–3.4)
MCH, POC: 31.1 pg (ref 27–31.2)
MCHC: 32.4 g/dL (ref 31.8–35.4)
MCV: 96 fL (ref 80–97)
MID (cbc): 0.5 (ref 0–0.9)
MPV: 8.8 fL (ref 0–99.8)
POC GRANULOCYTE: 4.2 (ref 2–6.9)
POC LYMPH PERCENT: 31.8 %L (ref 10–50)
POC MID %: 7.8 % (ref 0–12)
Platelet Count, POC: 367 10*3/uL (ref 142–424)
RBC: 4.05 M/uL (ref 4.04–5.48)
RDW, POC: 12.9 %
WBC: 7 10*3/uL (ref 4.6–10.2)

## 2014-01-02 LAB — COMPREHENSIVE METABOLIC PANEL
ALT: 16 U/L (ref 0–35)
AST: 21 U/L (ref 0–37)
Albumin: 4.5 g/dL (ref 3.5–5.2)
Alkaline Phosphatase: 51 U/L (ref 39–117)
BUN: 14 mg/dL (ref 6–23)
CALCIUM: 10 mg/dL (ref 8.4–10.5)
CO2: 22 mEq/L (ref 19–32)
Chloride: 90 mEq/L — ABNORMAL LOW (ref 96–112)
Creatinine, Ser: 0.92 mg/dL (ref 0.50–1.10)
GFR calc non Af Amer: 64 mL/min — ABNORMAL LOW (ref >90)
GFR, EST AFRICAN AMERICAN: 75 mL/min — AB (ref >90)
GLUCOSE: 117 mg/dL — AB (ref 70–99)
Potassium: 4.5 mEq/L (ref 3.7–5.3)
Sodium: 128 mEq/L — ABNORMAL LOW (ref 137–147)
TOTAL PROTEIN: 8 g/dL (ref 6.0–8.3)
Total Bilirubin: 0.3 mg/dL (ref 0.3–1.2)

## 2014-01-02 LAB — I-STAT TROPONIN, ED: TROPONIN I, POC: 0 ng/mL (ref 0.00–0.08)

## 2014-01-02 LAB — APTT: aPTT: 31 seconds (ref 24–37)

## 2014-01-02 MED ORDER — SODIUM CHLORIDE 0.9 % IV SOLN
INTRAVENOUS | Status: DC
  Administered 2014-01-02 – 2014-01-03 (×2): via INTRAVENOUS

## 2014-01-02 MED ORDER — LORAZEPAM 2 MG/ML IJ SOLN
1.0000 mg | Freq: Once | INTRAMUSCULAR | Status: AC
  Administered 2014-01-02: 1 mg via INTRAVENOUS
  Filled 2014-01-02: qty 1

## 2014-01-02 MED ORDER — ASPIRIN 300 MG RE SUPP
300.0000 mg | Freq: Every day | RECTAL | Status: DC
  Filled 2014-01-02: qty 1

## 2014-01-02 MED ORDER — PREDNISONE 10 MG PO KIT: PACK | ORAL | Status: DC

## 2014-01-02 NOTE — ED Provider Notes (Signed)
CSN: [id number]     Arrival date & time 01/02/14  2046 History   First MD Initiated Contact with Patient 01/02/14 2050     Chief Complaint  Patient presents with  . Cerebrovascular Accident     (Consider location/radiation/quality/duration/timing/severity/associated sxs/prior Treatment) HPI Comments: Pt with L hand and wrist weakness and numbness for past 6 days. Pt states she initially felt it 6 days ago, and went to her chiropractor who diagnosed her with slip disc. Pt has been feeling same since then, and went to urgent care today who got a outpatient MRI showing abnormality with her R ICA and as well with a watershed infarct. Pt denies chest pain, SOB, abd pain, n/v/d, f/c. Denies dysarthria, problems with vision or hearing, lower ext complaints, not confused, no HA. No trauma.  Patient is a 63 y.o. female presenting with Acute Neurological Problem. The history is provided by the patient.  Cerebrovascular Accident This is a new problem. The current episode started in the past 7 days. The problem occurs constantly. The problem has been unchanged. Associated symptoms include numbness (L hand numbness) and weakness (L hand weakness). Pertinent negatives include no abdominal pain, chest pain, chills, congestion, coughing, diaphoresis, fever, headaches, nausea, rash or vomiting. Nothing aggravates the symptoms. She has tried nothing for the symptoms. The treatment provided no relief.    Past Medical History  Diagnosis Date  . IBS (irritable bowel syndrome)   . Anxiety   . GERD (gastroesophageal reflux disease)   . Depression   . Diverticulosis   . Hyperlipemia   . Hypertension   . Mitral valve prolapse   . Colitis   . COPD (chronic obstructive pulmonary disease)     no o2  . Sciatic nerve disease    Past Surgical History  Procedure Laterality Date  . Cholecystectomy     Family History  Problem Relation Age of Onset  . Colon cancer Paternal Aunt   . Breast cancer Mother   .  Ovarian cancer Mother   . Pancreatic cancer Maternal Grandmother   . Diabetes Maternal Aunt   . Heart disease Paternal Grandfather   . Kidney disease Maternal Aunt    History  Substance Use Topics  . Smoking status: Current Every Day Smoker -- 1.00 packs/day for 40 years    Types: Cigarettes  . Smokeless tobacco: Never Used  . Alcohol Use: No   OB History   Grav Para Term Preterm Abortions TAB SAB Ect Mult Living                 Review of Systems  Constitutional: Negative for fever, chills and diaphoresis.  HENT: Negative for congestion and rhinorrhea.   Eyes: Negative for discharge, redness and itching.  Respiratory: Negative for cough.   Cardiovascular: Negative for chest pain.  Gastrointestinal: Negative for nausea, vomiting and abdominal pain.  Genitourinary: Negative for dysuria, urgency and decreased urine volume.  Skin: Negative for rash.  Neurological: Positive for weakness (L hand weakness) and numbness (L hand numbness). Negative for headaches.      Allergies  Amoxicillin  Home Medications   Current Outpatient Rx  Name  Route  Sig  Dispense  Refill  . albuterol (PROVENTIL HFA;VENTOLIN HFA) 108 (90 BASE) MCG/ACT inhaler   Inhalation   Inhale 1-2 puffs into the lungs every 6 (six) hours as needed for wheezing or shortness of breath (SOB).         Marland Kitchen ALPRAZolam (XANAX) 0.25 MG tablet   Oral   Take  0.375 mg by mouth at bedtime as needed (anxiety). For sleep.         Marland Kitchen amLODipine (NORVASC) 10 MG tablet   Oral   Take 10 mg by mouth daily.         Marland Kitchen atenolol (TENORMIN) 50 MG tablet   Oral   Take 1 tablet (50 mg total) by mouth daily.   30 tablet   0   . COMBIVENT 18-103 MCG/ACT inhaler   Inhalation   Inhale 1-2 puffs into the lungs 4 (four) times daily.          . DULoxetine (CYMBALTA) 60 MG capsule   Oral   Take 60 mg by mouth daily.         . fenofibrate 160 MG tablet      One tablet by mouth once daily         . fluticasone  (FLONASE) 50 MCG/ACT nasal spray   Nasal   Place 2 sprays into the nose daily.          . isosorbide mononitrate (IMDUR) 30 MG 24 hr tablet   Oral   Take 30 mg by mouth 2 (two) times daily.         Marland Kitchen lisinopril-hydrochlorothiazide (PRINZIDE,ZESTORETIC) 20-25 MG per tablet   Oral   Take 1 tablet by mouth daily.         . pantoprazole (PROTONIX) 40 MG tablet   Oral   Take 40 mg by mouth 2 (two) times daily as needed. For stomach upset.          Marland Kitchen EXPIRED: cloNIDine (CATAPRES) 0.2 MG tablet   Oral   Take 1 tablet (0.2 mg total) by mouth 2 (two) times daily.   60 tablet   0   . EXPIRED: hydrochlorothiazide (HYDRODIURIL) 25 MG tablet   Oral   Take 1 tablet (25 mg total) by mouth daily.   30 tablet   0   . PredniSONE 10 MG KIT      Take tabs as directed on package   48 each   0    There were no vitals taken for this visit. Physical Exam  Constitutional: She is oriented to person, place, and time. She appears well-developed and well-nourished. No distress.  HENT:  Head: Normocephalic and atraumatic.  Mouth/Throat: Oropharynx is clear and moist.  Eyes: Conjunctivae and EOM are normal. Pupils are equal, round, and reactive to light. Right eye exhibits no discharge. Left eye exhibits no discharge. No scleral icterus.  Neck: Normal range of motion. Neck supple.  Cardiovascular: Normal rate, regular rhythm and intact distal pulses.  Exam reveals no gallop and no friction rub.   No murmur heard. Pulmonary/Chest: Effort normal and breath sounds normal. No respiratory distress. She has no wheezes. She has no rales.  Abdominal: Soft. She exhibits no distension and no mass. There is no tenderness.  Musculoskeletal: Normal range of motion.  Neurological: She is alert and oriented to person, place, and time. No cranial nerve deficit. She exhibits normal muscle tone. Coordination normal.  CN II-XII intact, 4/5 strength LUE and L hand, diminished sensation L hand. 5/5 strength  elsewhere, intact sensation. 2+ DTRs patella and brachioradilias b/l, F2N negative, but difficult to do on L hand d/t weakness. H2S negative. AOx3  Skin: She is not diaphoretic.    ED Course  Procedures (including critical care time) Labs Review Labs Reviewed  CBC - Abnormal; Notable for the following:    RBC 3.86 (*)  HCT 35.1 (*)    All other components within normal limits  COMPREHENSIVE METABOLIC PANEL - Abnormal; Notable for the following:    Sodium 128 (*)    Chloride 90 (*)    Glucose, Bld 117 (*)    GFR calc non Af Amer 64 (*)    GFR calc Af Amer 75 (*)    All other components within normal limits  I-STAT CHEM 8, ED - Abnormal; Notable for the following:    Sodium 130 (*)    Chloride 95 (*)    Glucose, Bld 119 (*)    All other components within normal limits  ETHANOL  PROTIME-INR  APTT  DIFFERENTIAL  URINALYSIS, ROUTINE W REFLEX MICROSCOPIC  HEMOGLOBIN A1C  LIPID PANEL  I-STAT TROPOININ, ED  Randolm Idol, ED   Imaging Review Mr Jodene Nam Head Wo Contrast  01/02/2014   CLINICAL DATA:  Left-sided upper extremity weakness and facial droop for 1 week. Numbness.  EXAM: MRI HEAD WITHOUT CONTRAST  MRA HEAD WITHOUT CONTRAST  TECHNIQUE: Multiplanar, multiecho pulse sequences of the brain and surrounding structures were obtained without intravenous contrast. Angiographic images of the head were obtained using MRA technique without contrast.  COMPARISON:  None available.  FINDINGS: MRI HEAD FINDINGS  The diffusion-weighted images demonstrate scattered watershed type infarcts involving the right frontal lobe. These extending to the coronal radiata. T2 changes are associated with the infarct both along the cortex and white matter tracts.  There is no other significant white matter disease. Flow is present in the major intracranial arteries. The ventricles are of normal size. No significant extra-axial fluid collection is present.  The globes and orbits are intact. The paranasal sinuses  are clear. Minimal fluid is present in the left mastoid air cells. No obstructing nasopharyngeal lesion is evident.  MRA HEAD FINDINGS  The right internal carotid artery is decreased in caliber relative to the left. No focal stenosis is evident. There is significant decreased signal in the right A1 segment compared to the left, likely reflecting decreased perfusion. A 2 mm infundibulum is present at the left posterior communicating artery. The left A1 segment is normal. The anterior communicating artery is patent. The left M1 segment and bifurcation is within normal limits. There is decreased caliber to the right M1 segment. Signal intensity in the right MCA bifurcations is decreased relative to the left. No focal stenosis is evident.  The right vertebral artery is hypoplastic and terminates at the PICA. The left vertebral artery feeds the basilar artery. There is no focal stenosis. The left PICA origin is visualized and normal. Both posterior cerebral arteries originate from the basilar tip. The PCA branch vessels are within normal limits.  IMPRESSION: 1. Acute nonhemorrhagic watershed territory infarcts involving the right frontal lobe. The majority these are white matter although there is a focal area of cortical infarct. 2. Decreased caliber of the right internal carotid artery and decreased signal within the right A1 segment and MCA branches reflecting decreased profusion. This suggests a more proximal stenosis, likely at the right carotid artery bifurcation. These results were called by telephone at the time of interpretation on 01/02/2014 at 8:35 PM to Dr. Ellison Carwin , who verbally acknowledged these results.   Electronically Signed   By: Lawrence Santiago M.D.   On: 01/02/2014 20:35   Mr Brain Wo Contrast  01/02/2014   CLINICAL DATA:  Left-sided upper extremity weakness and facial droop for 1 week. Numbness.  EXAM: MRI HEAD WITHOUT CONTRAST  MRA HEAD WITHOUT CONTRAST  TECHNIQUE: Multiplanar, multiecho  pulse sequences of the brain and surrounding structures were obtained without intravenous contrast. Angiographic images of the head were obtained using MRA technique without contrast.  COMPARISON:  None available.  FINDINGS: MRI HEAD FINDINGS  The diffusion-weighted images demonstrate scattered watershed type infarcts involving the right frontal lobe. These extending to the coronal radiata. T2 changes are associated with the infarct both along the cortex and white matter tracts.  There is no other significant white matter disease. Flow is present in the major intracranial arteries. The ventricles are of normal size. No significant extra-axial fluid collection is present.  The globes and orbits are intact. The paranasal sinuses are clear. Minimal fluid is present in the left mastoid air cells. No obstructing nasopharyngeal lesion is evident.  MRA HEAD FINDINGS  The right internal carotid artery is decreased in caliber relative to the left. No focal stenosis is evident. There is significant decreased signal in the right A1 segment compared to the left, likely reflecting decreased perfusion. A 2 mm infundibulum is present at the left posterior communicating artery. The left A1 segment is normal. The anterior communicating artery is patent. The left M1 segment and bifurcation is within normal limits. There is decreased caliber to the right M1 segment. Signal intensity in the right MCA bifurcations is decreased relative to the left. No focal stenosis is evident.  The right vertebral artery is hypoplastic and terminates at the PICA. The left vertebral artery feeds the basilar artery. There is no focal stenosis. The left PICA origin is visualized and normal. Both posterior cerebral arteries originate from the basilar tip. The PCA branch vessels are within normal limits.  IMPRESSION: 1. Acute nonhemorrhagic watershed territory infarcts involving the right frontal lobe. The majority these are white matter although there is a  focal area of cortical infarct. 2. Decreased caliber of the right internal carotid artery and decreased signal within the right A1 segment and MCA branches reflecting decreased profusion. This suggests a more proximal stenosis, likely at the right carotid artery bifurcation. These results were called by telephone at the time of interpretation on 01/02/2014 at 8:35 PM to Dr. Ellison Carwin , who verbally acknowledged these results.   Electronically Signed   By: Lawrence Santiago M.D.   On: 01/02/2014 20:35     EKG Interpretation None      MDM   MDM: 63 y.o WF w/ PMHx of HLD, COPD, HTN, tobacco abuse w/ cc: of Stroke on MRI sent from [facility]. 1 week of L hand numbness and weakness. No other deficits. Went to UC today who ordered outpatient MRI/MRA showing acute stroke and R carotid disease. Pt sent to ED. Pt with L hand and wrist weakness and numbness but otherwise no deficit. D/w Dr. Armida Sans of Neurology who recommend against tpa d/t time course, and recommend mediicne admission. Discussed need for emergent vasc surg or NSU consult d/t carotid dz and he states that it will be evaluated and managed as inpatient. Strok e labs ordered, will admit to [facility]. No change in neuro exam while in ED. HDS. Admit to [facility]. Care of case d/w my attending.  Final diagnoses:  Stroke    Admit to Hospitalist  Sol Passer, MD 01/02/14 2302

## 2014-01-02 NOTE — Consult Note (Signed)
Referring Physician: ED    Chief Complaint: Left hand weakness  HPI:                                                                                                                                         Shannon Roberts is an 63 y.o. female, right handed, with a past medical history significant for HTN, hyperlipidemia, smoking, COPD, anxiety, depression, who initially presented to urgent care with left hand weakness and was sent to [facility] for MRI brain which revealed acute stroke. She expressed that this past Tuesday she woke up with numbness and tingling in her left hand but approximately 3 days later she started having problems using the left hand due to weakness. She said that she hasn't been able to  open and close her left hand and flexion-extension-spreading finger movements are also impaired in the left hand. Denies HA, vertigo, double vision, difficulty swallowing, slurred speech, language or vision impairment. No neck pain or trauma. MRI brain revealed acute nonhemorrhagic watershed territory infarcts involving the right frontal lobe.  MRA brain: decreased caliber of the right internal carotid artery and decreased signal within the right A1 segment and MCA branches reflecting decreased perfusion.  Date last known well: 12/27/13 Time last known well: uncertain tPA Given: no, late presentation NIHSS: 1  MRS:1   Past Medical History  Diagnosis Date  . IBS (irritable bowel syndrome)   . Anxiety   . GERD (gastroesophageal reflux disease)   . Depression   . Diverticulosis   . Hyperlipemia   . Hypertension   . Mitral valve prolapse   . Colitis   . COPD (chronic obstructive pulmonary disease)     no o2  . Sciatic nerve disease     Past Surgical History  Procedure Laterality Date  . Cholecystectomy      Family History  Problem Relation Age of Onset  . Colon cancer Paternal Aunt   . Breast cancer Mother   . Ovarian cancer Mother   . Pancreatic cancer Maternal Grandmother   .  Diabetes Maternal Aunt   . Heart disease Paternal Grandfather   . Kidney disease Maternal Aunt    Social History:  reports that she has been smoking Cigarettes.  She has a 40 pack-year smoking history. She has never used smokeless tobacco. She reports that she does not drink alcohol or use illicit drugs.  Allergies:  Allergies  Allergen Reactions  . Amoxicillin Rash    Medications:  I have reviewed the patient's current medications.  ROS:                                                                                                                                       History obtained from the patient and daughter  General ROS: negative for - chills, fatigue, fever, night sweats,or weight loss Psychological ROS: negative for - behavioral disorder, hallucinations, memory difficulties, or suicidal ideation Ophthalmic ROS: negative for - blurry vision, double vision, eye pain or loss of vision ENT ROS: negative for - epistaxis, nasal discharge, oral lesions, sore throat, tinnitus or vertigo Allergy and Immunology ROS: negative for - hives or itchy/watery eyes Hematological and Lymphatic ROS: negative for - bleeding problems, bruising or swollen lymph nodes Endocrine ROS: negative for - galactorrhea, hair pattern changes, polydipsia/polyuria or temperature intolerance Respiratory ROS: negative for - cough, hemoptysis, shortness of breath or wheezing Cardiovascular ROS: negative for - chest pain, dyspnea on exertion, edema or irregular heartbeat Gastrointestinal ROS: negative for - abdominal pain, diarrhea, hematemesis, nausea/vomiting or stool incontinence Genito-Urinary ROS: negative for - dysuria, hematuria, incontinence or urinary frequency/urgency Musculoskeletal ROS: negative for - joint swelling Neurological ROS: as noted in HPI Dermatological ROS: negative for  rash and skin lesion changes  Physical exam: pleasant female in no apparent distress. BP 145/80, P 82, R,17, afebrile Head: normocephalic. Neck: supple, no bruits, no JVD. Cardiac: no murmurs. Lungs: clear. Abdomen: soft, no tender, no mass. Extremities: no edema.  Neurologic Examination:                                                                                                      Mental Status: Alert, oriented, thought content appropriate.  Speech fluent without evidence of aphasia.  Able to follow 3 step commands without difficulty. Cranial Nerves: II: Discs flat bilaterally; Visual fields grossly normal, pupils equal, round, reactive to light and accommodation III,IV, VI: ptosis not present, extra-ocular motions intact bilaterally V,VII: smile symmetric, facial light touch sensation normal bilaterally VIII: hearing normal bilaterally IX,X: gag reflex present XI: bilateral shoulder shrug XII: midline tongue extension without atrophy or fasciculations Motor: Significant for marked weakness left hand flexors and extensors, can not open-close left hand Tone and bulk:normal tone throughout; no atrophy noted Sensory: Pinprick and light touch intact throughout, bilaterally Deep Tendon Reflexes:  1+ upper extremities and 2+LE bilaterally Plantars: Right: downgoing   Left: downgoing Cerebellar: normal finger-to-nose,  normal heel-to-shin test Gait:  No tested CV: pulses palpable throughout  Results for orders placed during the hospital encounter of 01/02/14 (from the past 48 hour(s))  CBC     Status: Abnormal   Collection Time    01/02/14  9:43 PM      Result Value Ref Range   WBC 8.6  4.0 - 10.5 K/uL   RBC 3.86 (*) 3.87 - 5.11 MIL/uL   Hemoglobin 12.3  12.0 - 15.0 g/dL   HCT 35.1 (*) 36.0 - 46.0 %   MCV 90.9  78.0 - 100.0 fL   MCH 31.9  26.0 - 34.0 pg   MCHC 35.0  30.0 - 36.0 g/dL   RDW 12.2  11.5 - 15.5 %   Platelets 305  150 - 400 K/uL  DIFFERENTIAL     Status:  None   Collection Time    01/02/14  9:43 PM      Result Value Ref Range   Neutrophils Relative % 72  43 - 77 %   Neutro Abs 6.2  1.7 - 7.7 K/uL   Lymphocytes Relative 19  12 - 46 %   Lymphs Abs 1.6  0.7 - 4.0 K/uL   Monocytes Relative 8  3 - 12 %   Monocytes Absolute 0.7  0.1 - 1.0 K/uL   Eosinophils Relative 1  0 - 5 %   Eosinophils Absolute 0.1  0.0 - 0.7 K/uL   Basophils Relative 1  0 - 1 %   Basophils Absolute 0.1  0.0 - 0.1 K/uL  I-STAT TROPOININ, ED     Status: None   Collection Time    01/02/14  9:50 PM      Result Value Ref Range   Troponin i, poc 0.00  0.00 - 0.08 ng/mL   Comment 3            Comment: Due to the release kinetics of cTnI,     a negative result within the first hours     of the onset of symptoms does not rule out     myocardial infarction with certainty.     If myocardial infarction is still suspected,     repeat the test at appropriate intervals.  I-STAT CHEM 8, ED     Status: Abnormal   Collection Time    01/02/14  9:53 PM      Result Value Ref Range   Sodium 130 (*) 137 - 147 mEq/L   Potassium 4.2  3.7 - 5.3 mEq/L   Chloride 95 (*) 96 - 112 mEq/L   BUN 14  6 - 23 mg/dL   Creatinine, Ser 1.10  0.50 - 1.10 mg/dL   Glucose, Bld 119 (*) 70 - 99 mg/dL   Calcium, Ion 1.22  1.13 - 1.30 mmol/L   TCO2 22  0 - 100 mmol/L   Hemoglobin 13.6  12.0 - 15.0 g/dL   HCT 40.0  36.0 - 46.0 %   Mr [facility] Wo Contrast  01/02/2014   CLINICAL DATA:  Left-sided upper extremity weakness and facial droop for 1 week. Numbness.  EXAM: MRI HEAD WITHOUT CONTRAST  MRA HEAD WITHOUT CONTRAST  TECHNIQUE: Multiplanar, multiecho pulse sequences of the brain and surrounding structures were obtained without intravenous contrast. Angiographic images of the head were obtained using MRA technique without contrast.  COMPARISON:  None available.  FINDINGS: MRI HEAD FINDINGS  The diffusion-weighted images demonstrate scattered watershed type infarcts involving the right frontal lobe. These  extending to the coronal radiata. T2 changes are associated with the infarct both along the  cortex and white matter tracts.  There is no other significant white matter disease. Flow is present in the major intracranial arteries. The ventricles are of normal size. No significant extra-axial fluid collection is present.  The globes and orbits are intact. The paranasal sinuses are clear. Minimal fluid is present in the left mastoid air cells. No obstructing nasopharyngeal lesion is evident.  MRA HEAD FINDINGS  The right internal carotid artery is decreased in caliber relative to the left. No focal stenosis is evident. There is significant decreased signal in the right A1 segment compared to the left, likely reflecting decreased perfusion. A 2 mm infundibulum is present at the left posterior communicating artery. The left A1 segment is normal. The anterior communicating artery is patent. The left M1 segment and bifurcation is within normal limits. There is decreased caliber to the right M1 segment. Signal intensity in the right MCA bifurcations is decreased relative to the left. No focal stenosis is evident.  The right vertebral artery is hypoplastic and terminates at the PICA. The left vertebral artery feeds the basilar artery. There is no focal stenosis. The left PICA origin is visualized and normal. Both posterior cerebral arteries originate from the basilar tip. The PCA branch vessels are within normal limits.  IMPRESSION: 1. Acute nonhemorrhagic watershed territory infarcts involving the right frontal lobe. The majority these are white matter although there is a focal area of cortical infarct. 2. Decreased caliber of the right internal carotid artery and decreased signal within the right A1 segment and MCA branches reflecting decreased profusion. This suggests a more proximal stenosis, likely at the right carotid artery bifurcation. These results were called by telephone at the time of interpretation on 01/02/2014  at 8:35 PM to Dr. Ellison Carwin , who verbally acknowledged these results.   Electronically Signed   By: Lawrence Santiago M.D.   On: 01/02/2014 20:35   Mr Brain Wo Contrast  01/02/2014   CLINICAL DATA:  Left-sided upper extremity weakness and facial droop for 1 week. Numbness.  EXAM: MRI HEAD WITHOUT CONTRAST  MRA HEAD WITHOUT CONTRAST  TECHNIQUE: Multiplanar, multiecho pulse sequences of the brain and surrounding structures were obtained without intravenous contrast. Angiographic images of the head were obtained using MRA technique without contrast.  COMPARISON:  None available.  FINDINGS: MRI HEAD FINDINGS  The diffusion-weighted images demonstrate scattered watershed type infarcts involving the right frontal lobe. These extending to the coronal radiata. T2 changes are associated with the infarct both along the cortex and white matter tracts.  There is no other significant white matter disease. Flow is present in the major intracranial arteries. The ventricles are of normal size. No significant extra-axial fluid collection is present.  The globes and orbits are intact. The paranasal sinuses are clear. Minimal fluid is present in the left mastoid air cells. No obstructing nasopharyngeal lesion is evident.  MRA HEAD FINDINGS  The right internal carotid artery is decreased in caliber relative to the left. No focal stenosis is evident. There is significant decreased signal in the right A1 segment compared to the left, likely reflecting decreased perfusion. A 2 mm infundibulum is present at the left posterior communicating artery. The left A1 segment is normal. The anterior communicating artery is patent. The left M1 segment and bifurcation is within normal limits. There is decreased caliber to the right M1 segment. Signal intensity in the right MCA bifurcations is decreased relative to the left. No focal stenosis is evident.  The right vertebral artery is hypoplastic and terminates  at the PICA. The left vertebral  artery feeds the basilar artery. There is no focal stenosis. The left PICA origin is visualized and normal. Both posterior cerebral arteries originate from the basilar tip. The PCA branch vessels are within normal limits.  IMPRESSION: 1. Acute nonhemorrhagic watershed territory infarcts involving the right frontal lobe. The majority these are white matter although there is a focal area of cortical infarct. 2. Decreased caliber of the right internal carotid artery and decreased signal within the right A1 segment and MCA branches reflecting decreased profusion. This suggests a more proximal stenosis, likely at the right carotid artery bifurcation. These results were called by telephone at the time of interpretation on 01/02/2014 at 8:35 PM to Dr. Ellison Carwin , who verbally acknowledged these results.   Electronically Signed   By: Lawrence Santiago M.D.   On: 01/02/2014 20:35      Assessment: 63 y.o. female with new onset left hand weakness and MRI revealing acute watershed infarcts right frontal region with a pattern most likely consistent with embolism arising from from ipsilateral right ICA. Patient was not given thrombolysis due to late presentation. Admit to medicine. Stroke work up. Aspirin pending testing results. Stroke team will follow up in the morning.  Stroke Risk Factors - HTN, hyperlipidemia, smoking  Plan: 1. HgbA1c, fasting lipid panel 2. MRI, MRA  of the brain without contrast ( already completed) 3. Echocardiogram 4. Carotid dopplers 5. Prophylactic therapy-aspirin 6. Risk factor modification 7. Telemetry monitoring 8. Frequent neuro checks 9. PT/OT SLP   Dorian Pod, MD [facility] [phone]  01/02/2014, 10:09 PM

## 2014-01-02 NOTE — H&P (Signed)
[facility] History and Physical  Shannon Roberts OVF:[id number] DOB: Apr 01, 1951 DOA: 01/02/2014  Referring physician: ED physician PCP:  Melinda Crutch, MD   Chief Complaint: left hand weakness   HPI:  Patient is 63 year old female with hypertension, hyperlipidemia, COPD from long history of smoking, anxiety, depression who presented initially to urgent care with main concern of sudden onset of left hand weakness. She was sent for MRI of the brain which revealed acute stroke and she was transferred to [facility] cone for further evaluation. Patient explains that her left hand and wrist weakness and numbness started 6 days prior to admission and she went to see chiropractor. She denies similar events in the past. Chills denies chest pain, shortness of breath, no specific abdominal or urinary concerns, no fevers and chills. She also denies any difficulty with speech, no visual changes, no lower extremity numbness or tingling, no headaches.  In emergency department, patient was found to be hemodynamically stable. Neurology was consulted. [facility] asked to admit telemetry bed for further evaluation  Assessment and Plan: Active Problems: Acute nonhemorrhagic watershed infarct in the right frontal lobe - Admit patient to telemetry bed for further evaluation - Order 2-D echo, carotid Doppler - Lipid panel and A1c ordered - PT/OT/SLP evaluation Hypertension - Vital signs pending - Will continue current medical regimen with atenolol, Norvasc, lisinopril-HCTZ, clonidine, imdur COPD - Appears to be clinically compensated - Provide oxygen as needed, continue home regimen with bronchodilators Anxiety - Continue Ativan as needed Depression - Appears to be clinically compensated at this time   Radiological Exams on Admission: Mr Virgel Paling Wo Contrast   01/02/2014  Acute nonhemorrhagic watershed territory infarcts involving the right frontal lobe. The majority these are white matter although there is a  focal area of cortical infarct. Decreased caliber of the right internal carotid artery and decreased signal within the right A1 segment and MCA branches reflecting decreased profusion. This suggests a more proximal stenosis, likely at the right carotid artery bifurcation.  Code Status: Full Family Communication: Pt and daughter at bedside Disposition Plan: Admit for further evaluation    Review of Systems:  Constitutional: Negative for fever, chills and malaise/fatigue. Negative for diaphoresis.  HENT: Negative for hearing loss, ear pain, nosebleeds, congestion, sore throat, neck pain, tinnitus and ear discharge.   Eyes: Negative for blurred vision, double vision, photophobia, pain, discharge and redness.  Respiratory: Negative for cough, hemoptysis, sputum production, shortness of breath, wheezing and stridor.   Cardiovascular: Negative for chest pain, palpitations, orthopnea, claudication and leg swelling.  Gastrointestinal: Negative for nausea, vomiting and abdominal pain. Negative for heartburn, constipation, blood in stool and melena.  Genitourinary: Negative for dysuria, urgency, frequency, hematuria and flank pain.  Musculoskeletal: Negative for myalgias, back pain, joint pain and falls.  Skin: Negative for itching and rash.  Neurological: Per history of present illness Endo/Heme/Allergies: Negative for environmental allergies and polydipsia. Does not bruise/bleed easily.  Psychiatric/Behavioral: Negative for suicidal ideas. The patient is not nervous/anxious.      Past Medical History  Diagnosis Date  . IBS (irritable bowel syndrome)   . Anxiety   . GERD (gastroesophageal reflux disease)   . Depression   . Diverticulosis   . Hyperlipemia   . Hypertension   . Mitral valve prolapse   . Colitis   . COPD (chronic obstructive pulmonary disease)     no o2  . Sciatic nerve disease     Past Surgical History  Procedure Laterality Date  . Cholecystectomy  Social History:   reports that she has been smoking Cigarettes.  She has a 40 pack-year smoking history. She has never used smokeless tobacco. She reports that she does not drink alcohol or use illicit drugs.  Allergies  Allergen Reactions  . Amoxicillin Rash    Family History  Problem Relation Age of Onset  . Colon cancer Paternal Aunt   . Breast cancer Mother   . Ovarian cancer Mother   . Pancreatic cancer Maternal Grandmother   . Diabetes Maternal Aunt   . Heart disease Paternal Grandfather   . Kidney disease Maternal Aunt     Prior to Admission medications   Medication Sig Start Date End Date Taking? Authorizing Provider  albuterol (PROVENTIL HFA;VENTOLIN HFA) 108 (90 BASE) MCG/ACT inhaler Inhale 1-2 puffs into the lungs every 6 (six) hours as needed for wheezing or shortness of breath (SOB).   Yes Historical Provider, MD  ALPRAZolam Duanne Moron) 0.25 MG tablet Take 0.375 mg by mouth at bedtime as needed (anxiety). For sleep. 02/26/11  Yes Historical Provider, MD  amLODipine (NORVASC) 10 MG tablet Take 10 mg by mouth daily.   Yes Historical Provider, MD  atenolol (TENORMIN) 50 MG tablet Take 1 tablet (50 mg total) by mouth daily. 10/08/11  Yes Radene Gunning, NP  COMBIVENT 18-103 MCG/ACT inhaler Inhale 1-2 puffs into the lungs 4 (four) times daily.  04/30/11  Yes Historical Provider, MD  DULoxetine (CYMBALTA) 60 MG capsule Take 60 mg by mouth daily.   Yes Historical Provider, MD  fenofibrate 160 MG tablet One tablet by mouth once daily 02/23/11  Yes Historical Provider, MD  fluticasone (FLONASE) 50 MCG/ACT nasal spray Place 2 sprays into the nose daily.  03/12/11  Yes Historical Provider, MD  isosorbide mononitrate (IMDUR) 30 MG 24 hr tablet Take 30 mg by mouth 2 (two) times daily. 10/08/11 01/02/14 Yes Lezlie Octave Black, NP  lisinopril-hydrochlorothiazide (PRINZIDE,ZESTORETIC) 20-25 MG per tablet Take 1 tablet by mouth daily.   Yes Historical Provider, MD  pantoprazole (PROTONIX) 40 MG tablet Take 40 mg by mouth 2  (two) times daily as needed. For stomach upset.    Yes Historical Provider, MD  cloNIDine (CATAPRES) 0.2 MG tablet Take 1 tablet (0.2 mg total) by mouth 2 (two) times daily. 10/08/11 10/07/12  Radene Gunning, NP  hydrochlorothiazide (HYDRODIURIL) 25 MG tablet Take 1 tablet (25 mg total) by mouth daily. 10/08/11 10/07/12  Radene Gunning, NP  PredniSONE 10 MG KIT Take tabs as directed on package 01/02/14   Ellison Carwin, MD    Physical Exam: There were no vitals filed for this visit.  Physical Exam  Constitutional: Appears well-developed and well-nourished. No distress.  HENT: Normocephalic. External right and left ear normal. Dry MM Eyes: Conjunctivae and EOM are normal. PERRLA, no scleral icterus.  Neck: Normal ROM. Neck supple. No JVD. No tracheal deviation. No thyromegaly.  CVS: RRR, S1/S2 +, no murmurs, no gallops, no carotid bruit.  Pulmonary: Effort and breath sounds normal, no stridor, rhonchi, wheezes, rales.  Abdominal: Soft. BS +,  no distension, tenderness, rebound or guarding.  Musculoskeletal: Normal range of motion. No edema and no tenderness.  Lymphadenopathy: No lymphadenopathy noted, cervical, inguinal. Neuro: Alert. Normal reflexes, muscle tone coordination. Significant left hand weakness Skin: Skin is warm and dry. No rash noted. Not diaphoretic. No erythema. No pallor.  Psychiatric: Normal mood and affect.  Labs on Admission:  Basic Metabolic Panel:  Recent Labs Lab 01/02/14 2143 01/02/14 2153  NA 128* 130*  K  4.5 4.2  CL 90* 95*  CO2 22  --   GLUCOSE 117* 119*  BUN 14 14  CREATININE 0.92 1.10  CALCIUM 10.0  --    Liver Function Tests:  Recent Labs Lab 01/02/14 2143  AST 21  ALT 16  ALKPHOS 51  BILITOT 0.3  PROT 8.0  ALBUMIN 4.5   CBC:  Recent Labs Lab 01/02/14 1825 01/02/14 2143 01/02/14 2153  WBC 7.0 8.6  --   NEUTROABS  --  6.2  --   HGB 12.6 12.3 13.6  HCT 38.9 35.1* 40.0  MCV 96.0 90.9  --   PLT  --  305  --     EKG: Normal  sinus rhythm, no ST/T wave changes  Faye Ramsay, MD  [facility] Pager [phone]  If 7PM-7AM, please contact night-coverage [email or web address] Password [email or web address] 01/02/2014, 11:14 PM

## 2014-01-02 NOTE — ED Notes (Addendum)
LSN: [id number] on Sunday of last week. Pt came from urgent care for inability to move left fingers. C/o numbness and tingling Monday of last week. Went to urgent care today; was sent for MRI. Positive for ischemia still so sent here.

## 2014-01-02 NOTE — ED Notes (Signed)
Consulting MD at bedside

## 2014-01-02 NOTE — Progress Notes (Addendum)
[facility] [address] [place] [address] [phone]  Date:  01/02/2014   Name:  Shannon Roberts   DOB:  [birth year]   MRN:  [id number]  PCP:   Melinda Crutch, MD    Chief Complaint: Numbness and Neck Pain   History of Present Illness:  Shannon Roberts is a 63 y.o. very pleasant female patient who presents with the following:  Monday noticed that she had weakness in the wrist and could not extend her fingers.  She cannot spread her fingers.  She can make a fist but coarsely.  She cannot flex or extend her wrist.  She has no headache, neuro or visual symptoms.  No slurred speech or facial asymmetry.  No difficulty with expression.  Has no chest pain or shortness of breath.  She was seen by a chiropractor who suggested she had a pinched nerve in her neck by his interpretation of her xray and treated her three times with no improvement.  She called the paramedics who evaluated her and "agreed with the chiropractor" and she was not transported.  She is a smoker with a history of hypertension and hyperlipidemia.     Additional history obtained from daughter, the patient has worsened and the complete plegia of hand and wrist since Thursday.  Found to have hyponatremia during ER visit 2 weeks ago with sodium of 124 No improvement with over the counter medications or other home remedies. Denies other complaint or health concern today.   Patient Active Problem List   Diagnosis Date Noted  . Tobacco abuse 10/07/2011  . HTN (hypertension), malignant 10/07/2011  . Hyperlipidemia 10/07/2011  . Anxiety disorder 10/07/2011  . Esophageal reflux 05/13/2011  . Dysphagia, unspecified 05/13/2011  . Irritable bowel syndrome 05/13/2011  . Personal history of colonic polyps 05/13/2011  . COPD (chronic obstructive pulmonary disease) 05/13/2011    Past Medical History  Diagnosis Date  . IBS (irritable bowel syndrome)   . Anxiety   . GERD (gastroesophageal reflux disease)   .  Depression   . Diverticulosis   . Hyperlipemia   . Hypertension   . Mitral valve prolapse   . Colitis   . COPD (chronic obstructive pulmonary disease)     no o2  . Sciatic nerve disease     Past Surgical History  Procedure Laterality Date  . Cholecystectomy      History  Substance Use Topics  . Smoking status: Current Every Day Smoker -- 1.00 packs/day for 40 years    Types: Cigarettes  . Smokeless tobacco: Never Used  . Alcohol Use: No    Family History  Problem Relation Age of Onset  . Colon cancer Paternal Aunt   . Breast cancer Mother   . Ovarian cancer Mother   . Pancreatic cancer Maternal Grandmother   . Diabetes Maternal Aunt   . Heart disease Paternal Grandfather   . Kidney disease Maternal Aunt     Allergies  Allergen Reactions  . Amoxicillin Rash    Medication list has been reviewed and updated.  Current Outpatient Prescriptions on File Prior to Visit  Medication Sig Dispense Refill  . albuterol (PROVENTIL HFA;VENTOLIN HFA) 108 (90 BASE) MCG/ACT inhaler Inhale 1-2 puffs into the lungs every 6 (six) hours as needed for wheezing or shortness of breath (SOB).      Marland Kitchen ALPRAZolam (XANAX) 0.25 MG tablet Take 0.375 mg by mouth at bedtime as needed (anxiety). For sleep.      Marland Kitchen amLODipine (  NORVASC) 10 MG tablet Take 10 mg by mouth daily.      Marland Kitchen atenolol (TENORMIN) 50 MG tablet Take 1 tablet (50 mg total) by mouth daily.  30 tablet  0  . COMBIVENT 18-103 MCG/ACT inhaler Inhale 1-2 puffs into the lungs 4 (four) times daily.       . DULoxetine (CYMBALTA) 60 MG capsule Take 60 mg by mouth daily.      . fenofibrate 160 MG tablet One tablet by mouth once daily      . fluticasone (FLONASE) 50 MCG/ACT nasal spray Place 2 sprays into the nose daily.       Marland Kitchen lisinopril-hydrochlorothiazide (PRINZIDE,ZESTORETIC) 20-25 MG per tablet Take 1 tablet by mouth daily.      . pantoprazole (PROTONIX) 40 MG tablet Take 40 mg by mouth 2 (two) times daily as needed. For stomach upset.        . cloNIDine (CATAPRES) 0.2 MG tablet Take 1 tablet (0.2 mg total) by mouth 2 (two) times daily.  60 tablet  0  . hydrochlorothiazide (HYDRODIURIL) 25 MG tablet Take 1 tablet (25 mg total) by mouth daily.  30 tablet  0  . isosorbide mononitrate (IMDUR) 30 MG 24 hr tablet Take 30 mg by mouth 2 (two) times daily.       No current facility-administered medications on file prior to visit.    Review of Systems:  As per HPI, otherwise negative.    Physical Examination: Filed Vitals:   01/02/14 1658  BP: 144/78  Pulse: 90  Temp: 98.4 F (36.9 C)  Resp: 18   Filed Vitals:   01/02/14 1658  Height: 5\' 6"  (1.676 m)  Weight: 193 lb (87.544 kg)   Body mass index is 31.17 kg/(m^2). Ideal Body Weight: Weight in (lb) to have BMI = 25: 154.6  GEN: WDWN, NAD, Non-toxic, A & O x 3  Patient has a strong odor of cigarette smoke HEENT: Atraumatic, Normocephalic. Neck supple. No masses, No LAD. Ears and Nose: No external deformity. CV: RRR, No M/G/R. No JVD. No thrill. No extra heart sounds. PULM: CTA B, no wheezes, crackles, rhonchi. No retractions. No resp. distress. No accessory muscle use. ABD: S, NT, ND, +BS. No rebound. No HSM. EXTR: No c/c/e NEURO Normal gait. Patient cannot dorsiflex or palmar flex her wrist, cannot abduct her fingers nor extend her fingers. Cannot oppose the thumb.  Hypesthesia finger tips 2-5.  CN 2-12 intact.  PRRERLA. EOMI.  Lowers intact motor PSYCH: Normally interactive. Conversant. Not depressed or anxious appearing.  Calm demeanor.    Assessment and Plan: C6-7-8 lesion vs CVA Hyponatremia by history Hypertension Hyperlipidemia Tobacco abuse Scans Labs  Signed,  Ellison Carwin, MD

## 2014-01-03 ENCOUNTER — Inpatient Hospital Stay (HOSPITAL_COMMUNITY): Payer: No Typology Code available for payment source

## 2014-01-03 DIAGNOSIS — J441 Chronic obstructive pulmonary disease with (acute) exacerbation: Secondary | ICD-10-CM | POA: Diagnosis present

## 2014-01-03 DIAGNOSIS — I639 Cerebral infarction, unspecified: Secondary | ICD-10-CM | POA: Diagnosis present

## 2014-01-03 DIAGNOSIS — I6789 Other cerebrovascular disease: Secondary | ICD-10-CM

## 2014-01-03 DIAGNOSIS — I6350 Cerebral infarction due to unspecified occlusion or stenosis of unspecified cerebral artery: Secondary | ICD-10-CM

## 2014-01-03 LAB — URINALYSIS, ROUTINE W REFLEX MICROSCOPIC
Bilirubin Urine: NEGATIVE
Glucose, UA: NEGATIVE mg/dL
Hgb urine dipstick: NEGATIVE
KETONES UR: NEGATIVE mg/dL
Leukocytes, UA: NEGATIVE
NITRITE: NEGATIVE
PH: 6.5 (ref 5.0–8.0)
Protein, ur: NEGATIVE mg/dL
SPECIFIC GRAVITY, URINE: 1.01 (ref 1.005–1.030)
Urobilinogen, UA: 0.2 mg/dL (ref 0.0–1.0)

## 2014-01-03 LAB — BASIC METABOLIC PANEL
BUN: 12 mg/dL (ref 6–23)
CO2: 21 meq/L (ref 19–32)
CREATININE: 0.81 mg/dL (ref 0.50–1.10)
Calcium: 9.9 mg/dL (ref 8.4–10.5)
Chloride: 89 mEq/L — ABNORMAL LOW (ref 96–112)
GFR calc Af Amer: 87 mL/min — ABNORMAL LOW (ref >90)
GFR calc non Af Amer: 75 mL/min — ABNORMAL LOW (ref >90)
GLUCOSE: 138 mg/dL — AB (ref 70–99)
Potassium: 3.8 mEq/L (ref 3.7–5.3)
SODIUM: 126 meq/L — AB (ref 137–147)

## 2014-01-03 LAB — COMPREHENSIVE METABOLIC PANEL
ALBUMIN: 4.8 g/dL (ref 3.5–5.2)
ALT: 16 U/L (ref 0–35)
AST: 18 U/L (ref 0–37)
Alkaline Phosphatase: 49 U/L (ref 39–117)
BUN: 14 mg/dL (ref 6–23)
CALCIUM: 10.1 mg/dL (ref 8.4–10.5)
CHLORIDE: 94 meq/L — AB (ref 96–112)
CO2: 25 meq/L (ref 19–32)
Creat: 0.98 mg/dL (ref 0.50–1.10)
GLUCOSE: 103 mg/dL — AB (ref 70–99)
Potassium: 4.2 mEq/L (ref 3.5–5.3)
SODIUM: 128 meq/L — AB (ref 135–145)
TOTAL PROTEIN: 7.8 g/dL (ref 6.0–8.3)
Total Bilirubin: 0.3 mg/dL (ref 0.2–1.2)

## 2014-01-03 LAB — CBC
HEMATOCRIT: 32.2 % — AB (ref 36.0–46.0)
HEMOGLOBIN: 11.3 g/dL — AB (ref 12.0–15.0)
MCH: 31.6 pg (ref 26.0–34.0)
MCHC: 35.1 g/dL (ref 30.0–36.0)
MCV: 89.9 fL (ref 78.0–100.0)
Platelets: 279 10*3/uL (ref 150–400)
RBC: 3.58 MIL/uL — AB (ref 3.87–5.11)
RDW: 12.1 % (ref 11.5–15.5)
WBC: 6.7 10*3/uL (ref 4.0–10.5)

## 2014-01-03 LAB — HEMOGLOBIN A1C
Hgb A1c MFr Bld: 6 % — ABNORMAL HIGH (ref <5.7)
MEAN PLASMA GLUCOSE: 126 mg/dL — AB (ref <117)

## 2014-01-03 LAB — SEDIMENTATION RATE: SED RATE: 32 mm/h — AB (ref 0–22)

## 2014-01-03 LAB — OSMOLALITY, URINE: Osmolality, Ur: 350 mOsm/kg — ABNORMAL LOW (ref 390–1090)

## 2014-01-03 MED ORDER — IPRATROPIUM-ALBUTEROL 0.5-2.5 (3) MG/3ML IN SOLN: 3.0000 mL | RESPIRATORY_TRACT | Status: DC

## 2014-01-03 MED ORDER — IPRATROPIUM-ALBUTEROL 0.5-2.5 (3) MG/3ML IN SOLN
3.0000 mL | Freq: Two times a day (BID) | RESPIRATORY_TRACT | Status: DC
  Administered 2014-01-03 – 2014-01-07 (×9): 3 mL via RESPIRATORY_TRACT
  Filled 2014-01-03 (×9): qty 3

## 2014-01-03 MED ORDER — DEXTROSE 5 % IV SOLN
500.0000 mg | INTRAVENOUS | Status: DC
  Administered 2014-01-03 – 2014-01-04 (×2): 500 mg via INTRAVENOUS
  Filled 2014-01-03 (×3): qty 500

## 2014-01-03 MED ORDER — ASPIRIN EC 325 MG PO TBEC: 325.0000 mg | DELAYED_RELEASE_TABLET | Freq: Every day | ORAL | Status: DC

## 2014-01-03 MED ORDER — MENTHOL 3 MG MT LOZG
1.0000 | LOZENGE | OROMUCOSAL | Status: DC | PRN
  Filled 2014-01-03: qty 9

## 2014-01-03 MED ORDER — LISINOPRIL 20 MG PO TABS
20.0000 mg | ORAL_TABLET | Freq: Every day | ORAL | Status: DC
  Administered 2014-01-03 – 2014-01-07 (×5): 20 mg via ORAL
  Filled 2014-01-03 (×5): qty 1

## 2014-01-03 MED ORDER — ALBUTEROL SULFATE (2.5 MG/3ML) 0.083% IN NEBU
3.0000 mL | INHALATION_SOLUTION | Freq: Four times a day (QID) | RESPIRATORY_TRACT | Status: DC | PRN
  Administered 2014-01-03 – 2014-01-07 (×9): 3 mL via RESPIRATORY_TRACT
  Filled 2014-01-03 (×9): qty 3

## 2014-01-03 MED ORDER — METHYLPREDNISOLONE SODIUM SUCC 125 MG IJ SOLR
60.0000 mg | Freq: Four times a day (QID) | INTRAMUSCULAR | Status: DC
  Administered 2014-01-03 (×3): 60 mg via INTRAVENOUS
  Filled 2014-01-03: qty 0.96
  Filled 2014-01-03: qty 2
  Filled 2014-01-03 (×7): qty 0.96
  Filled 2014-01-03: qty 2

## 2014-01-03 MED ORDER — PANTOPRAZOLE SODIUM 40 MG PO TBEC
40.0000 mg | DELAYED_RELEASE_TABLET | Freq: Every day | ORAL | Status: DC
  Administered 2014-01-03 – 2014-01-07 (×5): 40 mg via ORAL
  Filled 2014-01-03 (×4): qty 1

## 2014-01-03 MED ORDER — SIMVASTATIN 10 MG PO TABS
10.0000 mg | ORAL_TABLET | Freq: Every day | ORAL | Status: DC
  Administered 2014-01-03 – 2014-01-06 (×4): 10 mg via ORAL
  Filled 2014-01-03 (×6): qty 1

## 2014-01-03 MED ORDER — NICOTINE 21 MG/24HR TD PT24
21.0000 mg | MEDICATED_PATCH | TRANSDERMAL | Status: DC
  Administered 2014-01-03 – 2014-01-07 (×5): 21 mg via TRANSDERMAL
  Filled 2014-01-03 (×5): qty 1

## 2014-01-03 MED ORDER — CLOPIDOGREL BISULFATE 75 MG PO TABS
75.0000 mg | ORAL_TABLET | Freq: Every day | ORAL | Status: DC
  Administered 2014-01-03 – 2014-01-07 (×5): 75 mg via ORAL
  Filled 2014-01-03 (×6): qty 1

## 2014-01-03 MED ORDER — BUDESONIDE 0.25 MG/2ML IN SUSP
0.2500 mg | Freq: Two times a day (BID) | RESPIRATORY_TRACT | Status: DC
  Administered 2014-01-03 – 2014-01-07 (×7): 0.25 mg via RESPIRATORY_TRACT
  Filled 2014-01-03 (×12): qty 2

## 2014-01-03 MED ORDER — HYDROCHLOROTHIAZIDE 25 MG PO TABS
25.0000 mg | ORAL_TABLET | Freq: Every day | ORAL | Status: DC
  Administered 2014-01-03: 25 mg via ORAL
  Filled 2014-01-03: qty 1

## 2014-01-03 MED ORDER — ALPRAZOLAM 0.25 MG PO TABS
0.3750 mg | ORAL_TABLET | Freq: Every evening | ORAL | Status: DC | PRN
  Administered 2014-01-04 – 2014-01-06 (×4): 0.375 mg via ORAL
  Filled 2014-01-03 (×4): qty 2

## 2014-01-03 MED ORDER — DULOXETINE HCL 60 MG PO CPEP
60.0000 mg | ORAL_CAPSULE | Freq: Every day | ORAL | Status: DC
  Administered 2014-01-03 – 2014-01-07 (×5): 60 mg via ORAL
  Filled 2014-01-03 (×5): qty 1

## 2014-01-03 MED ORDER — CLONIDINE HCL 0.2 MG PO TABS
0.2000 mg | ORAL_TABLET | Freq: Two times a day (BID) | ORAL | Status: DC
  Administered 2014-01-03 – 2014-01-07 (×9): 0.2 mg via ORAL
  Filled 2014-01-03 (×13): qty 1

## 2014-01-03 MED ORDER — STROKE: EARLY STAGES OF RECOVERY BOOK
Freq: Once | Status: AC
  Administered 2014-01-03: 10:00:00
  Filled 2014-01-03: qty 1

## 2014-01-03 MED ORDER — AMLODIPINE BESYLATE 10 MG PO TABS
10.0000 mg | ORAL_TABLET | Freq: Every day | ORAL | Status: DC
  Administered 2014-01-03 – 2014-01-07 (×5): 10 mg via ORAL
  Filled 2014-01-03 (×5): qty 1

## 2014-01-03 MED ORDER — IPRATROPIUM-ALBUTEROL 0.5-2.5 (3) MG/3ML IN SOLN: 3.0000 mL | Freq: Four times a day (QID) | RESPIRATORY_TRACT | Status: DC

## 2014-01-03 MED ORDER — ATENOLOL 50 MG PO TABS
50.0000 mg | ORAL_TABLET | Freq: Every day | ORAL | Status: DC
  Administered 2014-01-03 – 2014-01-07 (×5): 50 mg via ORAL
  Filled 2014-01-03 (×5): qty 1

## 2014-01-03 MED ORDER — BENZONATATE 100 MG PO CAPS
100.0000 mg | ORAL_CAPSULE | Freq: Three times a day (TID) | ORAL | Status: DC
  Administered 2014-01-03 – 2014-01-07 (×12): 100 mg via ORAL
  Filled 2014-01-03 (×16): qty 1

## 2014-01-03 MED ORDER — ALPRAZOLAM 0.25 MG PO TABS
0.2500 mg | ORAL_TABLET | Freq: Once | ORAL | Status: AC
  Administered 2014-01-03: 0.25 mg via ORAL
  Filled 2014-01-03: qty 1

## 2014-01-03 MED ORDER — ISOSORBIDE MONONITRATE ER 30 MG PO TB24
30.0000 mg | ORAL_TABLET | Freq: Two times a day (BID) | ORAL | Status: DC
  Administered 2014-01-03 – 2014-01-07 (×9): 30 mg via ORAL
  Filled 2014-01-03 (×13): qty 1

## 2014-01-03 MED ORDER — ENOXAPARIN SODIUM 40 MG/0.4ML ~~LOC~~ SOLN
40.0000 mg | SUBCUTANEOUS | Status: DC
  Administered 2014-01-03 – 2014-01-07 (×5): 40 mg via SUBCUTANEOUS
  Filled 2014-01-03 (×5): qty 0.4

## 2014-01-03 MED ORDER — LISINOPRIL-HYDROCHLOROTHIAZIDE 20-25 MG PO TABS: 1.0000 | ORAL_TABLET | Freq: Every day | ORAL | Status: DC

## 2014-01-03 MED ORDER — GUAIFENESIN-CODEINE 100-10 MG/5ML PO SOLN: 5.0000 mL | Freq: Four times a day (QID) | ORAL | Status: DC | PRN

## 2014-01-03 MED ORDER — FLUTICASONE PROPIONATE 50 MCG/ACT NA SUSP
2.0000 | Freq: Every day | NASAL | Status: DC
  Administered 2014-01-05 – 2014-01-07 (×3): 2 via NASAL
  Filled 2014-01-03 (×2): qty 16

## 2014-01-03 MED ORDER — HYDROCHLOROTHIAZIDE 25 MG PO TABS
25.0000 mg | ORAL_TABLET | Freq: Every day | ORAL | Status: DC
  Administered 2014-01-04: 25 mg via ORAL
  Filled 2014-01-03 (×3): qty 1

## 2014-01-03 NOTE — Progress Notes (Signed)
Physical Therapy Evaluation Patient Details Name: Shannon Roberts MRN: [id number] DOB: 10/10/1951 Today's Date: 01/03/2014   History of Present Illness  Patient is 63 year old female with hypertension, hyperlipidemia, COPD from long history of smoking, anxiety, depression who presented initially to urgent care with main concern of sudden onset of left hand weakness. She was sent for MRI of the brain which revealed acute stroke and she was transferred to [facility] cone for further evaluation. Patient explains that her left hand and wrist weakness and numbness started 6 days prior to admission and she went to see chiropractor.  MRI revealed Acute nonhemorrhagic watershed territory infarcts involving the right frontal lobe.    Clinical Impression  Patient evaluated by Physical Therapy with no further acute PT needs identified. All education has been completed and the patient has no further questions.  PT is signing off. Thank you for this referral.     Follow Up Recommendations No PT follow up    Equipment Recommendations  None recommended by PT    Recommendations for Other Services       Precautions / Restrictions Precautions Precautions: None      Mobility  Bed Mobility Overal bed mobility: Independent                Transfers Overall transfer level: Independent Equipment used: None Transfers: Sit to/from Stand Sit to Stand: Independent Stand pivot transfers: Supervision       General transfer comment: from bed, chair and toilet  Ambulation/Gait Ambulation/Gait assistance: Independent Ambulation Distance (Feet): 60 Feet Assistive device: None Gait Pattern/deviations: WFL(Within Functional Limits) Gait velocity: limited by dyspnea   General Gait Details: distance limited by dyspnea/COPD (pt awaiting her breathing treatment)  Stairs Stairs:  (deferred due to dyspnea; anticipate no difficutly)          Wheelchair Mobility    Modified Rankin (Stroke Patients  Only) Modified Rankin (Stroke Patients Only) Pre-Morbid Rankin Score: No symptoms Modified Rankin: Moderate disability    Balance Overall balance assessment: Independent   Sitting balance-Leahy Scale: Normal     Standing balance support: No upper extremity supported Standing balance-Leahy Scale: Normal   Single Leg Stance - Right Leg: 3 Single Leg Stance - Left Leg: 3 Tandem Stance - Right Leg: 20   Rhomberg - Eyes Opened: 30 Rhomberg - Eyes Closed: 30 High level balance activites: Backward walking;Direction changes;Turns;Sudden stops High Level Balance Comments: no unsteadiness noted    Hand Dominance Dominant Hand: Right    Extremity/Trunk Assessment Upper Extremity Assessment: Defer to OT evaluation       LUE Deficits / Details: shoulder and elbow and forearm grossly 4/5; wrist 3+/5;  Finger flexion 3/5; extension 1/5; thumb 1/5   Lower Extremity Assessment: LLE deficits/detail   LLE Deficits / Details: hip flexion 4/5, otherwise 5/5  Cervical / Trunk Assessment: Normal    Communication Communication: No difficulties  Cognition Arousal/Alertness: Awake/alert Behavior During Therapy: WFL for tasks assessed/performed Overall Cognitive Status: Within Functional Limits for tasks assessed                        General Comments General comments (skin integrity, edema, etc.): Pt proud that she has been doing her hand exercises given by OT and correctly demonstrated.  Exercises Other Exercises Other Exercises: Pt instructed to attempt finger extension (with hand palm up - gravity assisted) 10 reps 4-6x/day.       Pertinent Vitals/Pain Denied pain +wheezing and dyspnea; awaiting her usual breathing treatment (that  she uses every 4-6 hrs a day at home)    [place] expects to be discharged to:: Private residence Living Arrangements: Alone Available Help at Discharge: Family;Available PRN/intermittently Type of Home: House Home Access:  Stairs to enter Entrance Stairs-Rails: Can reach both Entrance Stairs-Number of Steps: 4 Home Layout: One level Home Equipment: None      Prior Function Level of Independence: Independent         Comments: Pt does light cooking and cleaning, mod I with grocery shopping, manages finances independently, and drives.  Son manages the household repairs and yard work     Assessment/Plan    PT Assessment Patent does not need any further PT services  PT Diagnosis     PT Problem List    PT Treatment Interventions     PT Goals (Current goals can be found in the Care Plan section) Acute Rehab PT Goals Patient Stated Goal: to regain use of Lt. UE PT Goal Formulation: No goals set, d/c therapy    Frequency     Barriers to discharge        End of Session   Activity Tolerance: Patient limited by fatigue (due to COPD/wheezing/dyspnea) Patient left: in bed;with call bell/phone within reach         Time: 1352-1407 PT Time Calculation (min): 15 min   Charges:   PT Evaluation $Initial PT Evaluation Tier I: 1 Procedure     PT G Codes:          Shannon Roberts 2014/01/30, 2:25 PM Pager [phone]

## 2014-01-03 NOTE — Consult Note (Signed)
Patient name: Shannon Roberts MRN: [id number] DOB: 02/05/1951 Sex: female   Referred by: Leonie Man  Reason for referral:  Chief Complaint  Patient presents with  . Cerebrovascular Accident    HISTORY OF PRESENT ILLNESS: The patient was admitted daily after several day history of clumsiness and increasing weakness in her left hand. She is right-handed. She had never had any prior neurologic deficits. She specifically denies a prior history of amaurosis fugax aphasia or stroke symptoms. She is a nonsmoker. She presented to urgent care and MRI showed a right frontal hemorrhagic stroke. She was transferred to [facility] for admission. MRA of the study suggested decreased perfusion of the intracranial ICA. 2-D echocardiogram results are pending and the patient is still waiting to have her carotid duplex today. Does have a history of chronic obstructive pulmonary disease. She is hypertensive and does have hyperlipidemia.  history of peripheral vascular disease in her family  Past Medical History  Diagnosis Date  . IBS (irritable bowel syndrome)   . Anxiety   . GERD (gastroesophageal reflux disease)   . Depression   . Diverticulosis   . Hyperlipemia   . Hypertension   . Mitral valve prolapse   . Colitis   . COPD (chronic obstructive pulmonary disease)     no o2  . Sciatic nerve disease     Past Surgical History  Procedure Laterality Date  . Cholecystectomy      History   Social History  . Marital Status: Divorced    Spouse Name: N/A    Number of Children: 2  . Years of Education: N/A   Occupational History  . Bookeeper    Social History Main Topics  . Smoking status: Current Every Day Smoker -- 1.00 packs/day for 40 years    Types: Cigarettes  . Smokeless tobacco: Never Used  . Alcohol Use: No  . Drug Use: No  . Sexual Activity: Not on file   Other Topics Concern  . Not on file   Social History Narrative   2-3 caffeine drinks daily     Family History    Problem Relation Age of Onset  . Colon cancer Paternal Aunt   . Breast cancer Mother   . Ovarian cancer Mother   . Pancreatic cancer Maternal Grandmother   . Diabetes Maternal Aunt   . Heart disease Paternal Grandfather   . Kidney disease Maternal Aunt     Allergies as of 01/02/2014 - Review Complete 01/02/2014  Allergen Reaction Noted  . Amoxicillin Rash 05/13/2011    No current facility-administered medications on file prior to encounter.   Current Outpatient Prescriptions on File Prior to Encounter  Medication Sig Dispense Refill  . albuterol (PROVENTIL HFA;VENTOLIN HFA) 108 (90 BASE) MCG/ACT inhaler Inhale 1-2 puffs into the lungs every 6 (six) hours as needed for wheezing or shortness of breath (SOB).      Marland Kitchen ALPRAZolam (XANAX) 0.25 MG tablet Take 0.375 mg by mouth at bedtime as needed (anxiety). For sleep.      Marland Kitchen amLODipine (NORVASC) 10 MG tablet Take 10 mg by mouth daily.      Marland Kitchen atenolol (TENORMIN) 50 MG tablet Take 1 tablet (50 mg total) by mouth daily.  30 tablet  0  . COMBIVENT 18-103 MCG/ACT inhaler Inhale 1-2 puffs into the lungs 4 (four) times daily.       . DULoxetine (CYMBALTA) 60 MG capsule Take 60 mg by mouth daily.      . fenofibrate 160  MG tablet One tablet by mouth once daily      . fluticasone (FLONASE) 50 MCG/ACT nasal spray Place 2 sprays into the nose daily.       . isosorbide mononitrate (IMDUR) 30 MG 24 hr tablet Take 30 mg by mouth 2 (two) times daily.      . pantoprazole (PROTONIX) 40 MG tablet Take 40 mg by mouth 2 (two) times daily as needed. For stomach upset.       . cloNIDine (CATAPRES) 0.2 MG tablet Take 1 tablet (0.2 mg total) by mouth 2 (two) times daily.  60 tablet  0  . hydrochlorothiazide (HYDRODIURIL) 25 MG tablet Take 1 tablet (25 mg total) by mouth daily.  30 tablet  0  . PredniSONE 10 MG KIT Take tabs as directed on package  48 each  0     REVIEW OF SYSTEMS:  Viewed in her history and physical with nothing Elestat other than those noted  in her  PHYSICAL EXAMINATION:  General: The patient is a well-nourished female, in no acute distress. Vital signs are BP 145/82  Pulse 81  Temp(Src) 97.8 F (36.6 C) (Oral)  Resp 18  Ht '5\' 6"'  (1.676 m)  Wt 193 lb 9 oz (87.8 kg)  BMI 31.26 kg/m2  SpO2 99% Pulmonary: There is a good air exchange  Abdomen: Soft and non-tender. No masses noted. No aneurysm palpable Musculoskeletal: There are no major deformities.  There is no significant extremity pain. Neurologic: Can raise her left arm against gravity. Does have marked weakness in her grip strength on the left Skin: There are no ulcer or rashes noted. Psychiatric: The patient has normal affect. Cardiovascular: 2+ radial and 2+ dorsalis pedis pulses bilaterally   Vascular Lab Studies:  Carotid duplex is pending. I called the vascular lab and they know that she is to be done today  Impression and Plan:  Right brain stroke with left arm weakness. Reviewed her MRI which does show ICA distribution stroke and congestion of diminished flow proximally. Discuss this at length with the patient. Await her duplex results. If this does show high-grade stenosis would recommend endarterectomy for reduction of further stroke risk. This could be done after a short recovery her new stroke.    Walt Geathers [facility] Office: [phone]

## 2014-01-03 NOTE — Progress Notes (Signed)
Echocardiogram 2D Echocardiogram has been performed.  Shannon Roberts 01/03/2014, 12:05 PM

## 2014-01-03 NOTE — Progress Notes (Signed)
*  PRELIMINARY RESULTS* Vascular Ultrasound Carotid Duplex (Doppler) has been completed.  Preliminary findings: Right = >80% ICA stenosis. Left = 40-59% ICA stenosis.  Antegrade vertebral flow.   Landry Mellow, RDMS, RVT  01/03/2014, 3:55 PM

## 2014-01-03 NOTE — Progress Notes (Signed)
PT Cancellation Note  Patient Details Name: Shannon Roberts MRN: [id number] DOB: Feb 03, 1951   Cancelled Treatment:    Reason Eval Not Completed: Patient at procedure or test/unavailable--echocardiogram    Shannon Roberts 01/03/2014, 10:55 AM Pager [phone]

## 2014-01-03 NOTE — Progress Notes (Signed)
Patient ID: Shannon Roberts  female  [id number]    DOB: 1951-02-02    DOA: 01/02/2014  PCP:  Melinda Crutch, MD  Assessment/Plan: Principal Problem:  Acute CVA (cerebral infarction) with left hand weakness - MRI of the brain was positive for acute nonhemorrhagic watershed territory infarct involving the right frontal lobe - MRA brain showed proximal stenosis, likely at the right carotid artery bifurcation. - 2-D echo- not readable ?? - Carotid Dopplers pending -Neurology following, on Plavix - Lipid panel showed cholesterol 150, LDL 91 placed on Zocor   Active Problems:   Esophageal reflux - Continue PPI    Tobacco abuse - Placed on nicotine patch    HTN (hypertension), malignant - Currently stable, DC IV fluids    Hyperlipidemia - Placed on Zocor    COPD exacerbation- actively wheezing - Placed on scheduled bronchodilators, Pulmicort, IV Solu-Medrol, IV Zithromax, flutter valve  Hyponatremia - Hold IV fluids, will check serum osmolarity urine Na and osmolarity  DVT Prophylaxis: lovenox  Code Status:Full code  Family Communication:Discussed in detail with the patient, her family members in the room  Disposition:  Consultants:  Stroke service  Procedures:  None  Antibiotics:  Zithromax    Subjective: Still has left-sided weakness  Objective: Weight change:   Intake/Output Summary (Last 24 hours) at 01/03/14 1507 Last data filed at 01/03/14 0824  Gross per 24 hour  Intake    120 ml  Output      0 ml  Net    120 ml   Blood pressure 145/82, pulse 81, temperature 97.8 F (36.6 C), temperature source Oral, resp. rate 18, height 5\' 6"  (1.676 m), weight 87.8 kg (193 lb 9 oz), SpO2 99.00%.  Physical Exam: General: Alert and awake, oriented x3, not in any acute distress. CVS: S1-S2 clear, no murmur rubs or gallops Chest:  diffuse expiratory wheezing bilaterally  Abdomen: soft nontender, nondistended, normal bowel sounds  Extremities: no cyanosis,  clubbing or edema noted bilaterally Neuro:  left hand weakness, strength 5/5 in bilateral lower extremity  Lab Results: Basic Metabolic Panel:  Recent Labs Lab 01/02/14 2143 01/02/14 2153 01/03/14 0840  NA 128* 130* 126*  K 4.5 4.2 3.8  CL 90* 95* 89*  CO2 22  --  21  GLUCOSE 117* 119* 138*  BUN 14 14 12   CREATININE 0.92 1.10 0.81  CALCIUM 10.0  --  9.9   Liver Function Tests:  Recent Labs Lab 01/02/14 1808 01/02/14 2143  AST 18 21  ALT 16 16  ALKPHOS 49 51  BILITOT 0.3 0.3  PROT 7.8 8.0  ALBUMIN 4.8 4.5   No results found for this basename: LIPASE, AMYLASE,  in the last 168 hours No results found for this basename: AMMONIA,  in the last 168 hours CBC:  Recent Labs Lab 01/02/14 2143 01/02/14 2153 01/03/14 0840  WBC 8.6  --  6.7  NEUTROABS 6.2  --   --   HGB 12.3 13.6 11.3*  HCT 35.1* 40.0 32.2*  MCV 90.9  --  89.9  PLT 305  --  279   Cardiac Enzymes: No results found for this basename: CKTOTAL, CKMB, CKMBINDEX, TROPONINI,  in the last 168 hours BNP: No components found with this basename: POCBNP,  CBG: No results found for this basename: GLUCAP,  in the last 168 hours   Micro Results: No results found for this or any previous visit (from the past 240 hour(s)).  Studies/Results: Dg Chest 2 View  01/03/2014   CLINICAL  DATA:  Shortness of breath  EXAM: CHEST  2 VIEW  COMPARISON:  DG CHEST 2 VIEW dated 10/07/2011  FINDINGS: The heart size and mediastinal contours are within normal limits. Both lungs are clear. The visualized skeletal structures are unremarkable.  IMPRESSION: No active cardiopulmonary disease.   Electronically Signed   By: Margaree Mackintosh M.D.   On: 01/03/2014 07:49   Mr Jodene Nam Head Wo Contrast  01/02/2014   CLINICAL DATA:  Left-sided upper extremity weakness and facial droop for 1 week. Numbness.  EXAM: MRI HEAD WITHOUT CONTRAST  MRA HEAD WITHOUT CONTRAST  TECHNIQUE: Multiplanar, multiecho pulse sequences of the brain and surrounding  structures were obtained without intravenous contrast. Angiographic images of the head were obtained using MRA technique without contrast.  COMPARISON:  None available.  FINDINGS: MRI HEAD FINDINGS  The diffusion-weighted images demonstrate scattered watershed type infarcts involving the right frontal lobe. These extending to the coronal radiata. T2 changes are associated with the infarct both along the cortex and white matter tracts.  There is no other significant white matter disease. Flow is present in the major intracranial arteries. The ventricles are of normal size. No significant extra-axial fluid collection is present.  The globes and orbits are intact. The paranasal sinuses are clear. Minimal fluid is present in the left mastoid air cells. No obstructing nasopharyngeal lesion is evident.  MRA HEAD FINDINGS  The right internal carotid artery is decreased in caliber relative to the left. No focal stenosis is evident. There is significant decreased signal in the right A1 segment compared to the left, likely reflecting decreased perfusion. A 2 mm infundibulum is present at the left posterior communicating artery. The left A1 segment is normal. The anterior communicating artery is patent. The left M1 segment and bifurcation is within normal limits. There is decreased caliber to the right M1 segment. Signal intensity in the right MCA bifurcations is decreased relative to the left. No focal stenosis is evident.  The right vertebral artery is hypoplastic and terminates at the PICA. The left vertebral artery feeds the basilar artery. There is no focal stenosis. The left PICA origin is visualized and normal. Both posterior cerebral arteries originate from the basilar tip. The PCA branch vessels are within normal limits.  IMPRESSION: 1. Acute nonhemorrhagic watershed territory infarcts involving the right frontal lobe. The majority these are white matter although there is a focal area of cortical infarct. 2. Decreased  caliber of the right internal carotid artery and decreased signal within the right A1 segment and MCA branches reflecting decreased profusion. This suggests a more proximal stenosis, likely at the right carotid artery bifurcation. These results were called by telephone at the time of interpretation on 01/02/2014 at 8:35 PM to Dr. Ellison Carwin , who verbally acknowledged these results.   Electronically Signed   By: Lawrence Santiago M.D.   On: 01/02/2014 20:35   Mr Brain Wo Contrast  01/02/2014   CLINICAL DATA:  Left-sided upper extremity weakness and facial droop for 1 week. Numbness.  EXAM: MRI HEAD WITHOUT CONTRAST  MRA HEAD WITHOUT CONTRAST  TECHNIQUE: Multiplanar, multiecho pulse sequences of the brain and surrounding structures were obtained without intravenous contrast. Angiographic images of the head were obtained using MRA technique without contrast.  COMPARISON:  None available.  FINDINGS: MRI HEAD FINDINGS  The diffusion-weighted images demonstrate scattered watershed type infarcts involving the right frontal lobe. These extending to the coronal radiata. T2 changes are associated with the infarct both along the cortex and white matter  tracts.  There is no other significant white matter disease. Flow is present in the major intracranial arteries. The ventricles are of normal size. No significant extra-axial fluid collection is present.  The globes and orbits are intact. The paranasal sinuses are clear. Minimal fluid is present in the left mastoid air cells. No obstructing nasopharyngeal lesion is evident.  MRA HEAD FINDINGS  The right internal carotid artery is decreased in caliber relative to the left. No focal stenosis is evident. There is significant decreased signal in the right A1 segment compared to the left, likely reflecting decreased perfusion. A 2 mm infundibulum is present at the left posterior communicating artery. The left A1 segment is normal. The anterior communicating artery is patent.  The left M1 segment and bifurcation is within normal limits. There is decreased caliber to the right M1 segment. Signal intensity in the right MCA bifurcations is decreased relative to the left. No focal stenosis is evident.  The right vertebral artery is hypoplastic and terminates at the PICA. The left vertebral artery feeds the basilar artery. There is no focal stenosis. The left PICA origin is visualized and normal. Both posterior cerebral arteries originate from the basilar tip. The PCA branch vessels are within normal limits.  IMPRESSION: 1. Acute nonhemorrhagic watershed territory infarcts involving the right frontal lobe. The majority these are white matter although there is a focal area of cortical infarct. 2. Decreased caliber of the right internal carotid artery and decreased signal within the right A1 segment and MCA branches reflecting decreased profusion. This suggests a more proximal stenosis, likely at the right carotid artery bifurcation. These results were called by telephone at the time of interpretation on 01/02/2014 at 8:35 PM to Dr. Ellison Carwin , who verbally acknowledged these results.   Electronically Signed   By: Lawrence Santiago M.D.   On: 01/02/2014 20:35    Medications: Scheduled Meds: . amLODipine  10 mg Oral Daily  . atenolol  50 mg Oral Daily  . azithromycin  500 mg Intravenous Q24H  . benzonatate  100 mg Oral TID  . budesonide  0.25 mg Nebulization BID  . cloNIDine  0.2 mg Oral BID  . clopidogrel  75 mg Oral Q breakfast  . DULoxetine  60 mg Oral Daily  . enoxaparin (LOVENOX) injection  40 mg Subcutaneous Q24H  . fluticasone  2 spray Each Nare Daily  . hydrochlorothiazide  25 mg Oral Daily  . ipratropium-albuterol  3 mL Inhalation BID  . isosorbide mononitrate  30 mg Oral BID  . lisinopril  20 mg Oral Daily  . methylPREDNISolone (SOLU-MEDROL) injection  60 mg Intravenous Q6H  . nicotine  21 mg Transdermal Q24H  . pantoprazole  40 mg Oral Daily      LOS: 1 day     Darrell Hauk M.D. [facility] 01/03/2014, 3:07 PM Pager: [phone]  If 7PM-7AM, please contact night-coverage [email or web address] Password [email or web address]

## 2014-01-03 NOTE — Evaluation (Signed)
Occupational Therapy Evaluation Patient Details Name: Shannon Roberts MRN: [id number] DOB: 16-Jun-1951 Today's Date: 01/03/2014 Time: 7062-3762 OT Time Calculation (min): 39 min  OT Assessment / Plan / Recommendation History of present illness Patient is 63 year old female with hypertension, hyperlipidemia, COPD from long history of smoking, anxiety, depression who presented initially to urgent care with main concern of sudden onset of left hand weakness. She was sent for MRI of the brain which revealed acute stroke and she was transferred to [facility] for further evaluation. Patient explains that her left hand and wrist weakness and numbness started 6 days prior to admission and she went to see chiropractor.  MRI revealed Acute nonhemorrhagic watershed territory infarcts involving the   Clinical Impression   Pt admitted with above. She demonstrates the below listed deficits and will benefit from continued OT to maximize safety and independence with BADLs.   Pt presents to OT with Lt. UE weakness Distal > proximal.  She requires min A for BADLs due to decreased functional use of Lt. UE.  Instructed pt in HEP.  She will benefit from follow up OT at discharge and would prefer HHOT due difficulty driving with one hand.  If she does not qualify, she will need OPOT     OT Assessment  Patient needs continued OT Services    Follow Up Recommendations  HH OT (if doesn't qualify, OP OT with neuro based OT);Supervision - Intermittent    Barriers to Discharge      Equipment Recommendations  None recommended by OT    Recommendations for Other Services    Frequency  Min 3X/week    Precautions / Restrictions Precautions Precautions: None   Pertinent Vitals/Pain     ADL  Eating/Feeding: Modified independent Where Assessed - Eating/Feeding: Edge of bed Grooming: Wash/dry hands;Brushing hair;Wash/dry face;Set up Where Assessed - Grooming: Unsupported standing Upper Body Bathing: Set up Where  Assessed - Upper Body Bathing: Unsupported sitting Lower Body Bathing: Set up Where Assessed - Lower Body Bathing: Unsupported sit to stand Upper Body Dressing: Minimal assistance Where Assessed - Upper Body Dressing: Unsupported sitting Lower Body Dressing: Minimal assistance Where Assessed - Lower Body Dressing: Unsupported sit to stand Toilet Transfer: Supervision/safety Toilet Transfer Method: Sit to stand;Stand pivot Science writer: Comfort height toilet Toileting - Clothing Manipulation and Hygiene: Minimal assistance Where Assessed - Camera operator Manipulation and Hygiene: Sit to stand from 3-in-1 or toilet Transfers/Ambulation Related to ADLs: Supervision due to IV and fatigues due to COPD.  Pt able to retrieve items from floor, in standing, with no LOB ADL Comments: Pt fatigues with BADLs, which she states is worse in hospital due to not having her inhalers.  She required assist to pull pants over hips, and assist with fasteners     OT Diagnosis: Generalized weakness;Hemiplegia dominant side  OT Problem List: Decreased strength;Decreased activity tolerance;Decreased coordination;Cardiopulmonary status limiting activity;Impaired UE functional use OT Treatment Interventions: Self-care/ADL training;Therapeutic exercise;Neuromuscular education;Therapeutic activities;Patient/family education   OT Goals(Current goals can be found in the care plan section) Acute Rehab OT Goals Patient Stated Goal: to regain use of Lt. UE OT Goal Formulation: With patient Time For Goal Achievement: 01/10/14 Potential to Achieve Goals: Good ADL Goals Pt Will Perform Upper Body Dressing: with modified independence;sitting (including fasteners) Pt Will Perform Lower Body Dressing: with modified independence;sit to/from stand Pt Will Perform Toileting - Clothing Manipulation and hygiene: with modified independence;sit to/from stand Pt/caregiver will Perform Home Exercise Program: Increased  ROM;Left upper extremity;Independently Additional ADL Goal #1:  Pt will demonstrate gross grasp and release of Lt UE in prep for use of Lt. UE in ADLs  Visit Information  Last OT Received On: 01/03/14 Assistance Needed: +1 History of Present Illness: Patient is 63 year old female with hypertension, hyperlipidemia, COPD from long history of smoking, anxiety, depression who presented initially to urgent care with main concern of sudden onset of left hand weakness. She was sent for MRI of the brain which revealed acute stroke and she was transferred to [facility] for further evaluation. Patient explains that her left hand and wrist weakness and numbness started 6 days prior to admission and she went to see chiropractor.  MRI revealed Acute nonhemorrhagic watershed territory infarcts involving the       Prior [place] expects to be discharged to:: Private residence Available Help at Discharge: Family;Available PRN/intermittently Type of Home: House Home Access: Stairs to enter [employer] of Steps: 4 Entrance Stairs-Rails: Can reach both Home Layout: One level Prior Function Level of Independence: Independent Comments: Pt does light cooking and cleaning, mod I with grocery shopping, manages finances independently, and drives.  Son manages the household repairs and yard work  Corporate investment banker: No difficulties Dominant Hand: Right         Vision/Perception Vision - History Baseline Vision: Wears glasses all the time Patient Visual Report: No change from baseline Vision - Assessment Eye Alignment: Within Functional Limits Vision Assessment: Vision tested Ocular Range of Motion: Within Functional Limits Tracking/Visual Pursuits: Able to track stimulus in all quads without difficulty Saccades: Within functional limits Visual Fields: No apparent deficits   Cognition  Cognition Arousal/Alertness: Awake/alert Behavior During  Therapy: WFL for tasks assessed/performed Overall Cognitive Status: Within Functional Limits for tasks assessed    Extremity/Trunk Assessment Upper Extremity Assessment Upper Extremity Assessment: LUE deficits/detail LUE Deficits / Details: shoulder and elbow and forearm grossly 4/5; wrist 3+/5;  Finger flexion 3/5; extension 1/5; thumb 1/5 LUE Sensation: decreased light touch LUE Coordination: decreased fine motor Lower Extremity Assessment Lower Extremity Assessment: Defer to PT evaluation Cervical / Trunk Assessment Cervical / Trunk Assessment: Normal     Mobility Bed Mobility Overal bed mobility: Independent Transfers Overall transfer level: Needs assistance Transfers: Sit to/from Stand;Stand Pivot Transfers Sit to Stand: Supervision Stand pivot transfers: Supervision     Exercise Other Exercises Other Exercises: Pt instructed to attempt finger extension (with hand palm up - gravity assisted) 10 reps 4-6x/day.     Balance Balance Overall balance assessment: No apparent balance deficits (not formally assessed) General Comments General comments (skin integrity, edema, etc.): no obvious cognitive deficits.  Pt able to recall info re; CVA (location and type), what testing has been completed and what is planned;  She is able to verbalize her risk factors for CVA.  Able to perform simple math calculations independently.  She is able to verbalize energy conservation techniques and how she incorporates those into daily activities   End of Session OT - End of Session Activity Tolerance: Patient limited by fatigue Patient left: in bed;with call bell/phone within reach Nurse Communication: Mobility status  [place], Ellard Artis M 01/03/2014, 1:38 PM

## 2014-01-03 NOTE — Progress Notes (Addendum)
Stroke Team Progress Note  HISTORY Shannon Roberts is an 63 y.o. female, right handed, with a past medical history significant for HTN, hyperlipidemia, smoking, COPD, anxiety, depression, who initially presented to urgent care 3/15 with left hand weakness and was sent to [facility] for MRI brain which revealed acute stroke.  She expressed that this past Tuesday 3/10 she woke up with numbness and tingling in her left hand but approximately 3 days later she started having problems using the left hand due to weakness. She said that she hasn't been able to open and close her left hand and flexion-extension-spreading finger movements are also impaired in the left hand. Denies HA, vertigo, double vision, difficulty swallowing, slurred speech, language or vision impairment. No neck pain or trauma. MRI brain revealed acute nonhemorrhagic watershed territory infarcts involving the right frontal lobe. MRA brain: decreased caliber of the right internal carotid artery and decreased signal within the right A1 segment and MCA branches reflecting decreased perfusion. Patient was not administerd TPA secondary to delay in arrival. She was admitted for further evaluation and treatment.  SUBJECTIVE Her daughter, Shannon Roberts, is at the bedside along with her son-in-law, who is her chiropractor.  Overall she feels her condition is stable.   OBJECTIVE Most recent Vital Signs: Filed Vitals:   01/03/14 0400 01/03/14 0600 01/03/14 0800 01/03/14 0823  BP: 157/89 158/79  139/62  Pulse: 84 88  89  Temp:    97.6 F (36.4 C)  TempSrc:    Oral  Resp:    20  Height:   5\' 6"  (1.676 m)   Weight:   87.8 kg (193 lb 9 oz)   SpO2:  97%  98%   CBG (last 3)  No results found for this basename: GLUCAP,  in the last 72 hours  IV Fluid Intake:   . sodium chloride 75 mL/hr at 01/02/14 2346    MEDICATIONS  . amLODipine  10 mg Oral Daily  . aspirin EC  325 mg Oral Daily  . atenolol  50 mg Oral Daily  . cloNIDine  0.2 mg Oral BID  .  DULoxetine  60 mg Oral Daily  . enoxaparin (LOVENOX) injection  40 mg Subcutaneous Q24H  . fluticasone  2 spray Each Nare Daily  . hydrochlorothiazide  25 mg Oral Daily  . hydrochlorothiazide  25 mg Oral Daily  . ipratropium-albuterol  3 mL Inhalation QID  . isosorbide mononitrate  30 mg Oral BID  . lisinopril  20 mg Oral Daily  . pantoprazole  40 mg Oral Daily   PRN:  albuterol, ALPRAZolam  Diet:  Cardiac thin liquids Activity:  OOB to chair, OOB  with assistance DVT Prophylaxis:  Lovenox 40 mg sq daily   CLINICALLY SIGNIFICANT STUDIES Basic Metabolic Panel:   Recent Labs Lab 01/02/14 2143 01/02/14 2153 01/03/14 0840  NA 128* 130* 126*  K 4.5 4.2 3.8  CL 90* 95* 89*  CO2 22  --  21  GLUCOSE 117* 119* 138*  BUN 14 14 12   CREATININE 0.92 1.10 0.81  CALCIUM 10.0  --  9.9   Liver Function Tests:   Recent Labs Lab 01/02/14 2143  AST 21  ALT 16  ALKPHOS 51  BILITOT 0.3  PROT 8.0  ALBUMIN 4.5   CBC:   Recent Labs Lab 01/02/14 2143 01/02/14 2153 01/03/14 0840  WBC 8.6  --  6.7  NEUTROABS 6.2  --   --   HGB 12.3 13.6 11.3*  HCT 35.1* 40.0 32.2*  MCV 90.9  --  89.9  PLT 305  --  279   Coagulation:   Recent Labs Lab 01/02/14 2143  LABPROT 12.5  INR 0.95   Cardiac Enzymes: No results found for this basename: CKTOTAL, CKMB, CKMBINDEX, TROPONINI,  in the last 168 hours Urinalysis:   Recent Labs Lab 01/03/14 0303  COLORURINE STRAW*  LABSPEC 1.010  PHURINE 6.5  GLUCOSEU NEGATIVE  HGBUR NEGATIVE  BILIRUBINUR NEGATIVE  KETONESUR NEGATIVE  PROTEINUR NEGATIVE  UROBILINOGEN 0.2  NITRITE NEGATIVE  LEUKOCYTESUR NEGATIVE   Lipid Panel    Component Value Date/Time   CHOL 158 01/02/2014 2255   TRIG 99 01/02/2014 2255   HDL 47 01/02/2014 2255   CHOLHDL 3.4 01/02/2014 2255   VLDL 20 01/02/2014 2255   LDLCALC 91 01/02/2014 2255   HgbA1C  No results found for this basename: HGBA1C    Urine Drug Screen:   No results found for this basename: labopia,   cocainscrnur,  labbenz,  amphetmu,  thcu,  labbarb    Alcohol Level:   Recent Labs Lab 01/02/14 2143  ETH <11   CT of the brain    MRI of the brain  01/02/2014     Acute nonhemorrhagic watershed territory infarcts involving the right frontal lobe. The majority these are white matter although there is a focal area of cortical infarct.  MRA of the brain  01/02/2014    Decreased caliber of the right internal carotid artery and decreased signal within the right A1 segment and MCA branches reflecting decreased profusion. This suggests a more proximal stenosis, likely at the right carotid artery bifurcation.  2D Echocardiogram    Carotid Doppler    CXR  01/03/2014    No active cardiopulmonary disease.    EKG  normal sinus rhythm. For complete results please see formal report.   Therapy Recommendations   Physical Exam   Pleasant middle aged lady not in distress.Awake alert. Afebrile. Head is nontraumatic. Neck is supple without bruit. Hearing is normal. Cardiac exam no murmur or gallop. Lungs are clear to auscultation. Distal pulses are well felt. Neurological Exam : Awake alert oriented x 3 normal speech and language. No face asymmetry. Tongue midline. No drift. Mild diminished fine finger movements on left. Orbits right over left upper extremity. Mild left grip weak.. Normal sensation . Normal coordination. ASSESSMENT [name] is a 63 y.o. female presenting with left hand numbness. Imaging confirms a right watershed infarct. Infarct felt to be thrombotic secondary to R ICA stenosis, which is yet to be proven.  On no antithrombotics prior to admission. Now on aspirin 325 mg orally every day for secondary stroke prevention. Patient with resultant left hand sensory deficit and left mild hemiparesis. Stroke work up underway.  hypertension Hyperlipidemia, LDL 91, on no statin PTA, now on no statin, at goal LDL < 100  MVP Obesity, Body mass index is 31.26 kg/(m^2).   Hospital day #  1  TREATMENT/PLAN  Change aspirin to clopidogrel 75 mg orally every day for secondary stroke prevention.  Stop smoking  F/u carotid dopplers, 2D echo, HgbA1c. Anticipate ICA stenosis, once confirmed, will need VVS consult (pt would prefer Dr. Donnetta Hutching if possible)  Therapy evals  Burnetta Sabin, MSN, RN, ANVP-BC, AGPCNP-BC [facility] Pager: [phone] 01/03/2014 10:07 AM  I have personally obtained a history, examined the patient, evaluated imaging results, and formulated the assessment and plan of care. I agree with the above.  Antony Contras, MD  To contact Stroke Continuity provider, please refer to  [email or web address] After hours, contact General Neurology

## 2014-01-04 DIAGNOSIS — E871 Hypo-osmolality and hyponatremia: Secondary | ICD-10-CM | POA: Diagnosis present

## 2014-01-04 LAB — BASIC METABOLIC PANEL
BUN: 15 mg/dL (ref 6–23)
CHLORIDE: 89 meq/L — AB (ref 96–112)
CO2: 20 mEq/L (ref 19–32)
Calcium: 9.9 mg/dL (ref 8.4–10.5)
Creatinine, Ser: 0.85 mg/dL (ref 0.50–1.10)
GFR calc non Af Amer: 71 mL/min — ABNORMAL LOW (ref >90)
GFR, EST AFRICAN AMERICAN: 82 mL/min — AB (ref >90)
GLUCOSE: 160 mg/dL — AB (ref 70–99)
POTASSIUM: 4.1 meq/L (ref 3.7–5.3)
Sodium: 126 mEq/L — ABNORMAL LOW (ref 137–147)

## 2014-01-04 LAB — OSMOLALITY: Osmolality: 262 mOsm/kg — ABNORMAL LOW (ref 275–300)

## 2014-01-04 MED ORDER — AZITHROMYCIN 500 MG PO TABS: 500.0000 mg | ORAL_TABLET | Freq: Every day | ORAL | Status: DC

## 2014-01-04 MED ORDER — METHYLPREDNISOLONE SODIUM SUCC 40 MG IJ SOLR
40.0000 mg | Freq: Three times a day (TID) | INTRAMUSCULAR | Status: DC
  Administered 2014-01-04 – 2014-01-05 (×3): 40 mg via INTRAVENOUS
  Filled 2014-01-04 (×6): qty 1

## 2014-01-04 MED ORDER — NICOTINE 21 MG/24HR TD PT24
21.0000 mg | MEDICATED_PATCH | TRANSDERMAL | Status: DC
Start: 2014-01-04 — End: 2014-09-01

## 2014-01-04 MED ORDER — ACETAMINOPHEN 325 MG PO TABS
650.0000 mg | ORAL_TABLET | Freq: Four times a day (QID) | ORAL | Status: DC | PRN
  Administered 2014-01-05: 650 mg via ORAL
  Filled 2014-01-04: qty 2

## 2014-01-04 MED ORDER — BENZONATATE 100 MG PO CAPS: 100.0000 mg | ORAL_CAPSULE | Freq: Three times a day (TID) | ORAL | Status: DC | PRN

## 2014-01-04 MED ORDER — SIMVASTATIN 10 MG PO TABS: 10.0000 mg | ORAL_TABLET | Freq: Every day | ORAL | Status: DC

## 2014-01-04 MED ORDER — GUAIFENESIN-CODEINE 100-10 MG/5ML PO SOLN: 5.0000 mL | Freq: Four times a day (QID) | ORAL | Status: DC | PRN

## 2014-01-04 MED ORDER — BUDESONIDE 0.25 MG/2ML IN SUSP: 0.2500 mg | Freq: Two times a day (BID) | RESPIRATORY_TRACT | Status: DC

## 2014-01-04 MED ORDER — CLOPIDOGREL BISULFATE 75 MG PO TABS: 75.0000 mg | ORAL_TABLET | Freq: Every day | ORAL | Status: DC

## 2014-01-04 NOTE — Progress Notes (Signed)
Occupational Therapy Treatment Patient Details Name: Shannon Roberts MRN: [id number] DOB: [birth year] Today's Date: 01/04/2014 Time: 7564-3329 OT Time Calculation (min): 19 min  OT Assessment / Plan / Recommendation  History of present illness Patient is 63 year old female with hypertension, hyperlipidemia, COPD from long history of smoking, anxiety, depression who presented initially to urgent care with main concern of sudden onset of left hand weakness. She was sent for MRI of the brain which revealed acute stroke and she was transferred to [facility] for further evaluation. Patient explains that her left hand and wrist weakness and numbness started 6 days prior to admission and she went to see chiropractor.  MRI revealed Acute nonhemorrhagic watershed territory infarcts involving the right frontal lobe.    OT comments  Session focused on exercises for Lt hand/wrist.   Follow Up Recommendations  Supervision - Intermittent;Home health OT    Barriers to Discharge       Equipment Recommendations  None recommended by OT    Recommendations for Other Services    Frequency Min 3X/week   Progress towards OT Goals Progress towards OT goals: Progressing toward goals  Plan Discharge plan remains appropriate    Precautions / Restrictions Precautions Precautions: None   Pertinent Vitals/Pain No pain reported.     ADL  Toilet Transfer: Independent Armed forces technical officer Method: Sit to Loss adjuster, chartered: Regular height toilet Transfers/Ambulation Related to ADLs: independent ADL Comments: Pt performed exercises for left hand/wrist. Told pt to be using left hand during activities. Practiced grasping and releasing objects with left hand.     OT Diagnosis:    OT Problem List:   OT Treatment Interventions:     OT Goals(current goals can now be found in the care plan section) Acute Rehab OT Goals Patient Stated Goal: not stated OT Goal Formulation: With patient Time For Goal  Achievement: 01/10/14 Potential to Achieve Goals: Good  Visit Information  Last OT Received On: 01/04/14 Assistance Needed: +1 History of Present Illness: Patient is 63 year old female with hypertension, hyperlipidemia, COPD from long history of smoking, anxiety, depression who presented initially to urgent care with main concern of sudden onset of left hand weakness. She was sent for MRI of the brain which revealed acute stroke and she was transferred to [facility] for further evaluation. Patient explains that her left hand and wrist weakness and numbness started 6 days prior to admission and she went to see chiropractor.  MRI revealed Acute nonhemorrhagic watershed territory infarcts involving the right frontal lobe.     Subjective Data      Prior Functioning       Cognition  Cognition Arousal/Alertness: Awake/alert Behavior During Therapy: WFL for tasks assessed/performed Overall Cognitive Status: Within Functional Limits for tasks assessed    Mobility  Transfers Overall transfer level: Independent Equipment used: None Transfers: Sit to/from Stand Sit to Stand: Independent    Exercises  Other Exercises Other Exercises: Performed finger extension exercises. Performed wrist extension exercises against gravity.  Other Exercises: Practiced grasping/releasing objects with left hand.   Balance    End of Session OT - End of Session Activity Tolerance: Patient tolerated treatment well Patient left: in bed;with call bell/phone within reach  [place], [place] L OTR/L [phone] 01/04/2014, 4:01 PM

## 2014-01-04 NOTE — ED Provider Notes (Signed)
I saw and evaluated the patient, reviewed the resident's note and I agree with the findings and plan.   EKG Interpretation   Date/Time:  Sunday January 02 2014 22:32:50 EDT Ventricular Rate:  86 PR Interval:  187 QRS Duration: 99 QT Interval:  376 QTC Calculation: 450 R Axis:   70 Text Interpretation:  Sinus rhythm ED PHYSICIAN INTERPRETATION AVAILABLE  IN [facility] Confirmed by TEST, Record ([id number]) on 01/04/2014 9:48:48  AM     63  year old female with left upper extremity numbness and weakness. Outpatient MRI shows an acute stroke. Admission for further evaluation.  Virgel Manifold, MD 01/04/14 2202

## 2014-01-04 NOTE — Progress Notes (Signed)
   CARE MANAGEMENT NOTE 01/04/2014  Patient:  Shannon Roberts, Shannon Roberts   Account Number:  [id number]  Date Initiated:  01/04/2014  Documentation initiated by:  Olga Coaster  Subjective/Objective Assessment:   ADMITTED WITH STROKE     Action/Plan:   CM FOLLOWING FOR DCP   Anticipated DC Date:  01/06/2014   Anticipated DC Plan:  [place]  CM consult          Status of service:  In process, will continue to follow Medicare Important Message given?  NA - LOS <3 / Initial given by admissions (If response is "NO", the following Medicare IM given date fields will be blank)  Per UR Regulation:  Reviewed for med. necessity/level of care/duration of stay  Comments:  3/17/2015Mindi Slicker RN,BSN,MHA [phone]

## 2014-01-04 NOTE — Progress Notes (Signed)
Patient ID: Shannon Roberts  female  OIZ:[id number]    DOB: [birth year]    DOA: 01/02/2014  PCP:  Melinda Crutch, MD  Assessment/Plan: Principal Problem:  Acute CVA (cerebral infarction) with left hand weakness - MRI of the brain was positive for acute nonhemorrhagic watershed territory infarct involving the right frontal lobe - MRA brain showed proximal stenosis, likely at the right carotid artery bifurcation. - 2-D echo 3/17- not readable ?? Discussed in detail with Dr. Leonie Man, recommended to check 2-D echo again. I discussed in detail with 2-D echo department and ordered 2-D echo with Definity. - Carotid Dopplers showed right > E. person ICA stenosis, left 40-59% ICA stenosis - Vascular surgery consult obtained, patient seen by Dr. only, recommended right carotid endarterectomy in 12 weeks to reduce her stroke risk. -Neurology following, on Plavix - Lipid panel showed cholesterol 150, LDL 91 placed on Zocor.   Active Problems:   Esophageal reflux - Continue PPI    Tobacco abuse - Placed on nicotine patch    HTN (hypertension), malignant - Currently stable, DC IV fluids    Hyperlipidemia - Placed on Zocor    COPD exacerbation- wheezing improving - Placed on scheduled bronchodilators, Pulmicort - Taper IV Solu-Medrol to 40 mg every 8 hours - Patient reports that she cannot tolerate oral prednisone although she has done it in the past, feels that she gets very anxious and hyper after prednisone.  Hyponatremia: Likely due to SIADH from her pulmonary process and stroke - Hold off on IV fluids, patient has no acute encephalopathy, currently at her baseline status  DVT Prophylaxis: lovenox  Code Status:Full code  Family Communication:Discussed in detail with the patient  Disposition: Hopefully today evening or in a.m.  Consultants:  Stroke service  Vascular surgery, Dr. early  Procedures:  None  Antibiotics:  Zithromax    Subjective: Still has left-hand weakness,  otherwise no nausea vomiting diarrhea or constipation, ambulating without any difficulty  Objective: Weight change:   Intake/Output Summary (Last 24 hours) at 01/04/14 1431 Last data filed at 01/04/14 1019  Gross per 24 hour  Intake    480 ml  Output      0 ml  Net    480 ml   Blood pressure 118/52, pulse 84, temperature 97.7 F (36.5 C), temperature source Oral, resp. rate 18, height 5\' 6"  (1.676 m), weight 87.8 kg (193 lb 9 oz), SpO2 98.00%.  Physical Exam: General: Alert and awake, oriented x3, NAD. CVS: S1-S2 clear, no murmur rubs or gallops Chest: Mild scattered wheezing Abdomen: soft nontender, nondistended, normal bowel sounds  Extremities: no cyanosis, clubbing or edema noted bilaterally Neuro:  left hand weakness, strength 5/5 in bilateral lower extremity  Lab Results: Basic Metabolic Panel:  Recent Labs Lab 01/03/14 0840 01/04/14 0530  NA 126* 126*  K 3.8 4.1  CL 89* 89*  CO2 21 20  GLUCOSE 138* 160*  BUN 12 15  CREATININE 0.81 0.85  CALCIUM 9.9 9.9   Liver Function Tests:  Recent Labs Lab 01/02/14 1808 01/02/14 2143  AST 18 21  ALT 16 16  ALKPHOS 49 51  BILITOT 0.3 0.3  PROT 7.8 8.0  ALBUMIN 4.8 4.5   No results found for this basename: LIPASE, AMYLASE,  in the last 168 hours No results found for this basename: AMMONIA,  in the last 168 hours CBC:  Recent Labs Lab 01/02/14 2143 01/02/14 2153 01/03/14 0840  WBC 8.6  --  6.7  NEUTROABS 6.2  --   --  HGB 12.3 13.6 11.3*  HCT 35.1* 40.0 32.2*  MCV 90.9  --  89.9  PLT 305  --  279   Cardiac Enzymes: No results found for this basename: CKTOTAL, CKMB, CKMBINDEX, TROPONINI,  in the last 168 hours BNP: No components found with this basename: POCBNP,  CBG: No results found for this basename: GLUCAP,  in the last 168 hours   Micro Results: No results found for this or any previous visit (from the past 240 hour(s)).  Studies/Results: Dg Chest 2 View  01/03/2014   CLINICAL DATA:   Shortness of breath  EXAM: CHEST  2 VIEW  COMPARISON:  DG CHEST 2 VIEW dated 10/07/2011  FINDINGS: The heart size and mediastinal contours are within normal limits. Both lungs are clear. The visualized skeletal structures are unremarkable.  IMPRESSION: No active cardiopulmonary disease.   Electronically Signed   By: Margaree Mackintosh M.D.   On: 01/03/2014 07:49   Mr Jodene Nam Head Wo Contrast  01/02/2014   CLINICAL DATA:  Left-sided upper extremity weakness and facial droop for 1 week. Numbness.  EXAM: MRI HEAD WITHOUT CONTRAST  MRA HEAD WITHOUT CONTRAST  TECHNIQUE: Multiplanar, multiecho pulse sequences of the brain and surrounding structures were obtained without intravenous contrast. Angiographic images of the head were obtained using MRA technique without contrast.  COMPARISON:  None available.  FINDINGS: MRI HEAD FINDINGS  The diffusion-weighted images demonstrate scattered watershed type infarcts involving the right frontal lobe. These extending to the coronal radiata. T2 changes are associated with the infarct both along the cortex and white matter tracts.  There is no other significant white matter disease. Flow is present in the major intracranial arteries. The ventricles are of normal size. No significant extra-axial fluid collection is present.  The globes and orbits are intact. The paranasal sinuses are clear. Minimal fluid is present in the left mastoid air cells. No obstructing nasopharyngeal lesion is evident.  MRA HEAD FINDINGS  The right internal carotid artery is decreased in caliber relative to the left. No focal stenosis is evident. There is significant decreased signal in the right A1 segment compared to the left, likely reflecting decreased perfusion. A 2 mm infundibulum is present at the left posterior communicating artery. The left A1 segment is normal. The anterior communicating artery is patent. The left M1 segment and bifurcation is within normal limits. There is decreased caliber to the right  M1 segment. Signal intensity in the right MCA bifurcations is decreased relative to the left. No focal stenosis is evident.  The right vertebral artery is hypoplastic and terminates at the PICA. The left vertebral artery feeds the basilar artery. There is no focal stenosis. The left PICA origin is visualized and normal. Both posterior cerebral arteries originate from the basilar tip. The PCA branch vessels are within normal limits.  IMPRESSION: 1. Acute nonhemorrhagic watershed territory infarcts involving the right frontal lobe. The majority these are white matter although there is a focal area of cortical infarct. 2. Decreased caliber of the right internal carotid artery and decreased signal within the right A1 segment and MCA branches reflecting decreased profusion. This suggests a more proximal stenosis, likely at the right carotid artery bifurcation. These results were called by telephone at the time of interpretation on 01/02/2014 at 8:35 PM to Dr. Ellison Carwin , who verbally acknowledged these results.   Electronically Signed   By: Lawrence Santiago M.D.   On: 01/02/2014 20:35   Mr Brain Wo Contrast  01/02/2014   CLINICAL DATA:  Left-sided upper extremity weakness and facial droop for 1 week. Numbness.  EXAM: MRI HEAD WITHOUT CONTRAST  MRA HEAD WITHOUT CONTRAST  TECHNIQUE: Multiplanar, multiecho pulse sequences of the brain and surrounding structures were obtained without intravenous contrast. Angiographic images of the head were obtained using MRA technique without contrast.  COMPARISON:  None available.  FINDINGS: MRI HEAD FINDINGS  The diffusion-weighted images demonstrate scattered watershed type infarcts involving the right frontal lobe. These extending to the coronal radiata. T2 changes are associated with the infarct both along the cortex and white matter tracts.  There is no other significant white matter disease. Flow is present in the major intracranial arteries. The ventricles are of normal  size. No significant extra-axial fluid collection is present.  The globes and orbits are intact. The paranasal sinuses are clear. Minimal fluid is present in the left mastoid air cells. No obstructing nasopharyngeal lesion is evident.  MRA HEAD FINDINGS  The right internal carotid artery is decreased in caliber relative to the left. No focal stenosis is evident. There is significant decreased signal in the right A1 segment compared to the left, likely reflecting decreased perfusion. A 2 mm infundibulum is present at the left posterior communicating artery. The left A1 segment is normal. The anterior communicating artery is patent. The left M1 segment and bifurcation is within normal limits. There is decreased caliber to the right M1 segment. Signal intensity in the right MCA bifurcations is decreased relative to the left. No focal stenosis is evident.  The right vertebral artery is hypoplastic and terminates at the PICA. The left vertebral artery feeds the basilar artery. There is no focal stenosis. The left PICA origin is visualized and normal. Both posterior cerebral arteries originate from the basilar tip. The PCA branch vessels are within normal limits.  IMPRESSION: 1. Acute nonhemorrhagic watershed territory infarcts involving the right frontal lobe. The majority these are white matter although there is a focal area of cortical infarct. 2. Decreased caliber of the right internal carotid artery and decreased signal within the right A1 segment and MCA branches reflecting decreased profusion. This suggests a more proximal stenosis, likely at the right carotid artery bifurcation. These results were called by telephone at the time of interpretation on 01/02/2014 at 8:35 PM to Dr. Ellison Carwin , who verbally acknowledged these results.   Electronically Signed   By: Lawrence Santiago M.D.   On: 01/02/2014 20:35    Medications: Scheduled Meds: . amLODipine  10 mg Oral Daily  . atenolol  50 mg Oral Daily  .  azithromycin  500 mg Intravenous Q24H  . benzonatate  100 mg Oral TID  . budesonide  0.25 mg Nebulization BID  . cloNIDine  0.2 mg Oral BID  . clopidogrel  75 mg Oral Q breakfast  . DULoxetine  60 mg Oral Daily  . enoxaparin (LOVENOX) injection  40 mg Subcutaneous Q24H  . fluticasone  2 spray Each Nare Daily  . hydrochlorothiazide  25 mg Oral Daily  . ipratropium-albuterol  3 mL Inhalation BID  . isosorbide mononitrate  30 mg Oral BID  . lisinopril  20 mg Oral Daily  . methylPREDNISolone (SOLU-MEDROL) injection  40 mg Intravenous 3 times per day  . nicotine  21 mg Transdermal Q24H  . pantoprazole  40 mg Oral Daily  . simvastatin  10 mg Oral q1800      LOS: 2 days   RAI,RIPUDEEP M.D. [facility] 01/04/2014, 2:31 PM Pager: [phone]  If 7PM-7AM, please contact night-coverage [email or web address] Password  [email or web address]

## 2014-01-04 NOTE — Progress Notes (Signed)
Stroke Team Progress Note  HISTORY Shannon Roberts is an 63 y.o. female, right handed, with a past medical history significant for HTN, hyperlipidemia, smoking, COPD, anxiety, depression, who initially presented to urgent care 3/15 with left hand weakness and was sent to [facility] for MRI brain which revealed acute stroke.  She expressed that this past Tuesday 3/10 she woke up with numbness and tingling in her left hand but approximately 3 days later she started having problems using the left hand due to weakness. She said that she hasn't been able to open and close her left hand and flexion-extension-spreading finger movements are also impaired in the left hand. Denies HA, vertigo, double vision, difficulty swallowing, slurred speech, language or vision impairment. No neck pain or trauma. MRI brain revealed acute nonhemorrhagic watershed territory infarcts involving the right frontal lobe. MRA brain: decreased caliber of the right internal carotid artery and decreased signal within the right A1 segment and MCA branches reflecting decreased perfusion. Patient was not administerd TPA secondary to delay in arrival. She was admitted for further evaluation and treatment.  SUBJECTIVE Her daughter, Shannon Roberts, is at the bedside along with her son-in-law, who is her chiropractor.  Overall she feels her condition is stable.   OBJECTIVE Most recent Vital Signs: Filed Vitals:   01/04/14 0140 01/04/14 0550 01/04/14 0840 01/04/14 1010  BP:  126/68  118/52  Pulse:  80  84  Temp:  98.2 F (36.8 C)  97.7 F (36.5 C)  TempSrc:  Oral  Oral  Resp:  18  18  Height:      Weight:      SpO2: 93% 100% 100% 98%   CBG (last 3)  No results found for this basename: GLUCAP,  in the last 72 hours  IV Fluid Intake:      MEDICATIONS  . amLODipine  10 mg Oral Daily  . atenolol  50 mg Oral Daily  . azithromycin  500 mg Intravenous Q24H  . benzonatate  100 mg Oral TID  . budesonide  0.25 mg Nebulization BID  . cloNIDine  0.2  mg Oral BID  . clopidogrel  75 mg Oral Q breakfast  . DULoxetine  60 mg Oral Daily  . enoxaparin (LOVENOX) injection  40 mg Subcutaneous Q24H  . fluticasone  2 spray Each Nare Daily  . hydrochlorothiazide  25 mg Oral Daily  . ipratropium-albuterol  3 mL Inhalation BID  . isosorbide mononitrate  30 mg Oral BID  . lisinopril  20 mg Oral Daily  . methylPREDNISolone (SOLU-MEDROL) injection  40 mg Intravenous 3 times per day  . nicotine  21 mg Transdermal Q24H  . pantoprazole  40 mg Oral Daily  . simvastatin  10 mg Oral q1800   PRN:  albuterol, ALPRAZolam, guaiFENesin-codeine, menthol-cetylpyridinium  Diet:  Cardiac thin liquids Activity:  OOB to chair, OOB  with assistance DVT Prophylaxis:  Lovenox 40 mg sq daily   CLINICALLY SIGNIFICANT STUDIES Basic Metabolic Panel:   Recent Labs Lab 01/03/14 0840 01/04/14 0530  NA 126* 126*  K 3.8 4.1  CL 89* 89*  CO2 21 20  GLUCOSE 138* 160*  BUN 12 15  CREATININE 0.81 0.85  CALCIUM 9.9 9.9   Liver Function Tests:   Recent Labs Lab 01/02/14 1808 01/02/14 2143  AST 18 21  ALT 16 16  ALKPHOS 49 51  BILITOT 0.3 0.3  PROT 7.8 8.0  ALBUMIN 4.8 4.5   CBC:   Recent Labs Lab 01/02/14 2143 01/02/14 2153 01/03/14 0840  WBC 8.6  --  6.7  NEUTROABS 6.2  --   --   HGB 12.3 13.6 11.3*  HCT 35.1* 40.0 32.2*  MCV 90.9  --  89.9  PLT 305  --  279   Coagulation:   Recent Labs Lab 01/02/14 2143  LABPROT 12.5  INR 0.95   Cardiac Enzymes: No results found for this basename: CKTOTAL, CKMB, CKMBINDEX, TROPONINI,  in the last 168 hours Urinalysis:   Recent Labs Lab 01/03/14 0303  COLORURINE STRAW*  LABSPEC 1.010  PHURINE 6.5  GLUCOSEU NEGATIVE  HGBUR NEGATIVE  BILIRUBINUR NEGATIVE  KETONESUR NEGATIVE  PROTEINUR NEGATIVE  UROBILINOGEN 0.2  NITRITE NEGATIVE  LEUKOCYTESUR NEGATIVE   Lipid Panel    Component Value Date/Time   CHOL 158 01/02/2014 2255   TRIG 99 01/02/2014 2255   HDL 47 01/02/2014 2255   CHOLHDL 3.4  01/02/2014 2255   VLDL 20 01/02/2014 2255   LDLCALC 91 01/02/2014 2255   HgbA1C  Lab Results  Component Value Date   HGBA1C 6.0* 01/02/2014    Urine Drug Screen:   No results found for this basename: labopia,  cocainscrnur,  labbenz,  amphetmu,  thcu,  labbarb    Alcohol Level:   Recent Labs Lab 01/02/14 2143  ETH <11   CT of the brain    MRI of the brain  01/02/2014     Acute nonhemorrhagic watershed territory infarcts involving the right frontal lobe. The majority these are white matter although there is a focal area of cortical infarct.  MRA of the brain  01/02/2014    Decreased caliber of the right internal carotid artery and decreased signal within the right A1 segment and MCA branches reflecting decreased profusion. This suggests a more proximal stenosis, likely at the right carotid artery bifurcation.  2D Echocardiogram   01/04/2014  01/03/2014 not readable  Carotid Doppler  Right = >80% ICA stenosis. Left = 40-59% ICA stenosis. Antegrade vertebral flow.  CXR  01/03/2014    No active cardiopulmonary disease.    EKG  normal sinus rhythm. For complete results please see formal report.   Therapy Recommendations HH OT, no PT  Physical Exam   Pleasant middle aged lady not in distress.Awake alert. Afebrile. Head is nontraumatic. Neck is supple without bruit. Hearing is normal. Cardiac exam no murmur or gallop. Lungs are clear to auscultation. Distal pulses are well felt. Neurological Exam : Awake alert oriented x 3 normal speech and language. No face asymmetry. Tongue midline. No drift. Mild diminished fine finger movements on left. Orbits right over left upper extremity. Mild left grip weak.. Normal sensation . Normal coordination.  ASSESSMENT [name] is a 63 y.o. female presenting with left hand numbness. Imaging confirms a right watershed infarct. Infarct felt to be thrombotic secondary to R ICA stenosis > 80%. [facility] has been consulted and plans OR next week. On no  antithrombotics prior to admission. Now on clopidogrel 75 mg orally every day for secondary stroke prevention. Patient with resultant left hand sensory deficit and left mild hemiparesis. Stroke work up underway.  hypertension Hyperlipidemia, LDL 91, on no statin PTA, now on zocor, at goal LDL < 100  MVP Obesity, Body mass index is 31.26 kg/(m^2).   Hospital day # 2  TREATMENT/PLAN  Continue  clopidogrel 75 mg orally every day for secondary stroke prevention.  Stop smoking  Repeat 2D echo as first one was not readable  Once resulted, ok for discharge from stroke standpoint  Agree with plans  for R CEA next week  [place] OT  Follow up with Dr. Leonie Man in 2 months  Burnetta Sabin, MSN, RN, ANVP-BC, AGPCNP-BC [facility] Pager: [phone] 01/04/2014 11:33 AM  I have personally obtained a history, examined the patient, evaluated imaging results, and formulated the assessment and plan of care. I agree with the above.  Antony Contras, MD  To contact Stroke Continuity provider, please refer to [email or web address] After hours, contact General Neurology

## 2014-01-04 NOTE — Progress Notes (Signed)
Patient ID: Shannon Roberts, female   DOB: 28-Oct-1950, 63 y.o.   MRN: [id number] Well this morning. No further new neurologic deficits. Patient's a daughter is present in the room today for discussion. Reviewed duplex showing greater than 80% right internal carotid artery stenosis. Significant left carotid stenosis Impression and plan: Symptomatic right carotid stenosis with right brain stroke area and recommend continued physical therapy. Would recommend right carotid endarterectomy in 12 weeks to reduce stroke risk. Feel is okay for patient be discharged to home and we will readmit her next week for surgery.

## 2014-01-05 DIAGNOSIS — F17200 Nicotine dependence, unspecified, uncomplicated: Secondary | ICD-10-CM

## 2014-01-05 DIAGNOSIS — E871 Hypo-osmolality and hyponatremia: Secondary | ICD-10-CM

## 2014-01-05 LAB — BASIC METABOLIC PANEL
BUN: 19 mg/dL (ref 6–23)
CO2: 22 meq/L (ref 19–32)
Calcium: 9.8 mg/dL (ref 8.4–10.5)
Chloride: 85 mEq/L — ABNORMAL LOW (ref 96–112)
Creatinine, Ser: 0.82 mg/dL (ref 0.50–1.10)
GFR calc Af Amer: 86 mL/min — ABNORMAL LOW (ref >90)
GFR calc non Af Amer: 74 mL/min — ABNORMAL LOW (ref >90)
GLUCOSE: 168 mg/dL — AB (ref 70–99)
POTASSIUM: 4.5 meq/L (ref 3.7–5.3)
Sodium: 123 mEq/L — ABNORMAL LOW (ref 137–147)

## 2014-01-05 LAB — CREATININE, URINE, RANDOM: Creatinine, Urine: 35.14 mg/dL

## 2014-01-05 LAB — SODIUM, URINE, RANDOM: Sodium, Ur: 41 mEq/L

## 2014-01-05 MED ORDER — SODIUM CHLORIDE 0.9 % IV SOLN
INTRAVENOUS | Status: DC
  Administered 2014-01-05: 15:00:00 via INTRAVENOUS

## 2014-01-05 MED ORDER — AZITHROMYCIN 500 MG PO TABS
500.0000 mg | ORAL_TABLET | Freq: Every day | ORAL | Status: DC
  Administered 2014-01-05 – 2014-01-07 (×3): 500 mg via ORAL
  Filled 2014-01-05 (×3): qty 1

## 2014-01-05 MED ORDER — METHYLPREDNISOLONE SODIUM SUCC 40 MG IJ SOLR
40.0000 mg | Freq: Two times a day (BID) | INTRAMUSCULAR | Status: AC
  Administered 2014-01-05 – 2014-01-06 (×3): 40 mg via INTRAVENOUS
  Filled 2014-01-05 (×3): qty 1

## 2014-01-05 NOTE — Progress Notes (Signed)
Occupational Therapy Treatment Patient Details Name: Shannon Roberts MRN: [id number] DOB: 04-Jan-1951 Today's Date: 01/05/2014 Time: 1324-4010 OT Time Calculation (min): 32 min  OT Assessment / Plan / Recommendation  History of present illness Patient is 63 year old female with hypertension, hyperlipidemia, COPD from long history of smoking, anxiety, depression who presented initially to urgent care with main concern of sudden onset of left hand weakness. She was sent for MRI of the brain which revealed acute stroke and she was transferred to [facility] for further evaluation. Patient explains that her left hand and wrist weakness and numbness started 6 days prior to admission and she went to see chiropractor.  MRI revealed Acute nonhemorrhagic watershed territory infarcts involving the right frontal lobe.    OT comments  Pt now with active extension of digits as well as abd/add of digits.  She is able to use Lt. UE as an active assist during activities.  Instructed her on updated HEP  Follow Up Recommendations  Supervision - Intermittent;Home health OT    Barriers to Discharge       Equipment Recommendations  None recommended by OT    Recommendations for Other Services    Frequency Min 3X/week   Progress towards OT Goals Progress towards OT goals: Progressing toward goals  Plan Discharge plan remains appropriate    Precautions / Restrictions Precautions Precautions: None   Pertinent Vitals/Pain     ADL  Grooming: Wash/dry hands;Wash/dry face;Brushing hair;Modified independent Where Assessed - Grooming: Unsupported sitting;Unsupported standing ADL Comments: Pt now demonstrates active mass finger extension Lt. hand (she was not aware that she was able to perform until asked ).  Worked on active extension of digits, reaching for and retrieving objects with Lt. UE.  Pt instructed to use Lt. UE for all activities as much as possible, to focus on finger extension, translation of objects  in hand and thumb movements superimposed on fingers (which are very difficult).  She verbalized and demonstrated understanding     OT Diagnosis:    OT Problem List:   OT Treatment Interventions:     OT Goals(current goals can now be found in the care plan section) Acute Rehab OT Goals Patient Stated Goal: not stated Potential to Achieve Goals: Good ADL Goals Pt Will Perform Upper Body Dressing: with modified independence;sitting Pt Will Perform Lower Body Dressing: with modified independence;sit to/from stand Pt Will Perform Toileting - Clothing Manipulation and hygiene: with modified independence;sit to/from stand Pt/caregiver will Perform Home Exercise Program: Increased ROM;Left upper extremity;Independently Additional ADL Goal #1: Pt will demonstrate gross grasp and release of Lt UE in prep for use of Lt. UE in ADLs  Visit Information  Last OT Received On: 01/05/14 Assistance Needed: +1 History of Present Illness: Patient is 63 year old female with hypertension, hyperlipidemia, COPD from long history of smoking, anxiety, depression who presented initially to urgent care with main concern of sudden onset of left hand weakness. She was sent for MRI of the brain which revealed acute stroke and she was transferred to [facility] for further evaluation. Patient explains that her left hand and wrist weakness and numbness started 6 days prior to admission and she went to see chiropractor.  MRI revealed Acute nonhemorrhagic watershed territory infarcts involving the right frontal lobe.     Subjective Data      Prior Functioning       Cognition  Cognition Arousal/Alertness: Awake/alert Behavior During Therapy: WFL for tasks assessed/performed Overall Cognitive Status: Within Functional Limits for tasks assessed  Mobility       Exercises  Other Exercises Other Exercises: 10 reps isolated finger extension each digit; 10 reps mass finger extension  Other Exercises: 10 reps abd/add  digits Other Exercises: manipulation of objects with focus on thumb movements superimposed on fingers   Balance    End of Session OT - End of Session Activity Tolerance: Patient tolerated treatment well Patient left: in bed;with call bell/phone within reach  [place], Carzell Saldivar M 01/05/2014, 4:29 PM

## 2014-01-05 NOTE — Evaluation (Signed)
Speech Language Pathology Evaluation Patient Details Name: Shannon Roberts MRN: [id number] DOB: 10/26/1950 Today's Date: 01/05/2014 Time: 6568-1275 SLP Time Calculation (min): 21 min  Problem List:  Patient Active Problem List   Diagnosis Date Noted  . Hyponatremia 01/04/2014  . COPD exacerbation 01/03/2014  . CVA (cerebral infarction) 01/03/2014  . Stroke 01/02/2014  . Tobacco abuse 10/07/2011  . HTN (hypertension), malignant 10/07/2011  . Hyperlipidemia 10/07/2011  . Anxiety disorder 10/07/2011  . Esophageal reflux 05/13/2011  . Dysphagia, unspecified 05/13/2011  . Irritable bowel syndrome 05/13/2011  . Personal history of colonic polyps 05/13/2011  . COPD (chronic obstructive pulmonary disease) 05/13/2011   Past Medical History:  Past Medical History  Diagnosis Date  . IBS (irritable bowel syndrome)   . Anxiety   . GERD (gastroesophageal reflux disease)   . Depression   . Diverticulosis   . Hyperlipemia   . Hypertension   . Mitral valve prolapse   . Colitis   . COPD (chronic obstructive pulmonary disease)     no o2  . Sciatic nerve disease    Past Surgical History:  Past Surgical History  Procedure Laterality Date  . Cholecystectomy     HPI:  63 yo female adm to [facility] with left sided weakness, Pt found to have a right frontal lobe CVA. Pt does not work outside of the home but is responsible for managing home duties.  Her daugher (who is a Marine scientist) reports her mother will go home with her.      Assessment / Plan / Recommendation Clinical Impression  Pt with functional cognitive skills for current environment and fluent speech/language.  Pt's daughter reports pt with attention impairments since this neurological event.  Pt states her attention is impaired with others in the room resulting in decreased ability to recall detailed information from multiple paragraphs.   Pt able to follow directions and carry on high level conversation without indication of  dysarthria/language/motor planning issues.     SLP provided pt with tasks to complete to maximize attention rehabiliation - provided orally and in writing.  Advised her to have a family member assure she is managing home duties safely (eg appts, cooking, bills, medication administration).  Daughter stated pt is going home with her.  No further SLP indicated as all education is completed, pt,daughter agrees.     SLP Assessment  Patient does not need any further Speech Lanaguage Pathology Services    Follow Up Recommendations  None    Frequency and Duration  n/a      Pertinent Vitals/Pain Afebrile, decreased    SLP Evaluation Prior Functioning  Type of Home: House Available Help at Discharge: Family;Available PRN/intermittently   Cognition  Overall Cognitive Status: Within Functional Limits for tasks assessed Arousal/Alertness: Awake/alert Orientation Level: Oriented X4 Attention: Sustained;Selective (dtr reports changes in attention skills) Sustained Attention: Appears intact Selective Attention: Impaired (pt admits to difficulties reading with distractions in room) Selective Attention Impairment: Functional complex Memory: Appears intact (pt recalled events of medical testing, st pat's day, previous esoph dysphagia) Awareness: Appears intact (aware to hand deficits and need for HH OT) Problem Solving: Appears intact Safety/Judgment: Appears intact (reports got medications confused prior to this event)    Comprehension  Auditory Comprehension Overall Auditory Comprehension: Appears within functional limits for tasks assessed Yes/No Questions: Not tested Commands: Within Functional Limits Conversation: Complex Visual Recognition/Discrimination Discrimination: Within Function Limits Reading Comprehension Reading Status: Within funtional limits    Expression Expression Primary Mode of Expression: Verbal Verbal Expression Overall  Verbal Expression: Appears within functional  limits for tasks assessed Initiation: No impairment Level of Generative/Spontaneous Verbalization: Conversation Repetition:  (DNT) Naming: Not tested Pragmatics: No impairment Non-Verbal Means of Communication: Not applicable Written Expression Dominant Hand: Right Written Expression: Not tested   Oral / Motor Oral Motor/Sensory Function Overall Oral Motor/Sensory Function: Appears within functional limits for tasks assessed (decreased facial sensation on left, otherwise unremarkable) Motor Speech Overall Motor Speech: Appears within functional limits for tasks assessed Resonance: Within functional limits Articulation: Within functional limitis Intelligibility: Intelligible Motor Planning: Witnin functional limits Motor Speech Errors: Not applicable   Shannon Roberts, [facility] Roberts [facility] SLP [phone]

## 2014-01-05 NOTE — Progress Notes (Signed)
Stroke Team Progress Note  HISTORY Shannon Roberts is an 63 y.o. female, right handed, with a past medical history significant for HTN, hyperlipidemia, smoking, COPD, anxiety, depression, who initially presented to urgent care 3/15 with left hand weakness and was sent to [facility] for MRI brain which revealed acute stroke.  She expressed that this past Tuesday 3/10 she woke up with numbness and tingling in her left hand but approximately 3 days later she started having problems using the left hand due to weakness. She said that she hasn't been able to open and close her left hand and flexion-extension-spreading finger movements are also impaired in the left hand. Denies HA, vertigo, double vision, difficulty swallowing, slurred speech, language or vision impairment. No neck pain or trauma. MRI brain revealed acute nonhemorrhagic watershed territory infarcts involving the right frontal lobe. MRA brain: decreased caliber of the right internal carotid artery and decreased signal within the right A1 segment and MCA branches reflecting decreased perfusion. Patient was not administerd TPA secondary to delay in arrival. She was admitted for further evaluation and treatment.  SUBJECTIVE Family at bedside.  OBJECTIVE Most recent Vital Signs: Filed Vitals:   01/05/14 0100 01/05/14 0139 01/05/14 0645 01/05/14 0918  BP: 126/55  133/69   Pulse: 74  76   Temp: 97.4 F (36.3 C)  97.9 F (36.6 C)   TempSrc: Oral     Resp: 18  20   Height:      Weight:      SpO2: 97% 97% 95% 93%   CBG (last 3)  No results found for this basename: GLUCAP,  in the last 72 hours  IV Fluid Intake:      MEDICATIONS  . amLODipine  10 mg Oral Daily  . atenolol  50 mg Oral Daily  . azithromycin  500 mg Oral Daily  . benzonatate  100 mg Oral TID  . budesonide  0.25 mg Nebulization BID  . cloNIDine  0.2 mg Oral BID  . clopidogrel  75 mg Oral Q breakfast  . DULoxetine  60 mg Oral Daily  . enoxaparin (LOVENOX) injection  40 mg  Subcutaneous Q24H  . fluticasone  2 spray Each Nare Daily  . ipratropium-albuterol  3 mL Inhalation BID  . isosorbide mononitrate  30 mg Oral BID  . lisinopril  20 mg Oral Daily  . methylPREDNISolone (SOLU-MEDROL) injection  40 mg Intravenous 3 times per day  . nicotine  21 mg Transdermal Q24H  . pantoprazole  40 mg Oral Daily  . simvastatin  10 mg Oral q1800   PRN:  acetaminophen, albuterol, ALPRAZolam, guaiFENesin-codeine, menthol-cetylpyridinium  Diet:  Cardiac thin liquids Activity:  OOB to chair, OOB  with assistance DVT Prophylaxis:  Lovenox 40 mg sq daily   CLINICALLY SIGNIFICANT STUDIES Basic Metabolic Panel:   Recent Labs Lab 01/03/14 0840 01/04/14 0530  NA 126* 126*  K 3.8 4.1  CL 89* 89*  CO2 21 20  GLUCOSE 138* 160*  BUN 12 15  CREATININE 0.81 0.85  CALCIUM 9.9 9.9   Liver Function Tests:   Recent Labs Lab 01/02/14 1808 01/02/14 2143  AST 18 21  ALT 16 16  ALKPHOS 49 51  BILITOT 0.3 0.3  PROT 7.8 8.0  ALBUMIN 4.8 4.5   CBC:   Recent Labs Lab 01/02/14 2143 01/02/14 2153 01/03/14 0840  WBC 8.6  --  6.7  NEUTROABS 6.2  --   --   HGB 12.3 13.6 11.3*  HCT 35.1* 40.0 32.2*  MCV 90.9  --  89.9  PLT 305  --  279   Coagulation:   Recent Labs Lab 01/02/14 2143  LABPROT 12.5  INR 0.95   Cardiac Enzymes: No results found for this basename: CKTOTAL, CKMB, CKMBINDEX, TROPONINI,  in the last 168 hours Urinalysis:   Recent Labs Lab 01/03/14 0303  COLORURINE STRAW*  LABSPEC 1.010  PHURINE 6.5  GLUCOSEU NEGATIVE  HGBUR NEGATIVE  BILIRUBINUR NEGATIVE  KETONESUR NEGATIVE  PROTEINUR NEGATIVE  UROBILINOGEN 0.2  NITRITE NEGATIVE  LEUKOCYTESUR NEGATIVE   Lipid Panel    Component Value Date/Time   CHOL 158 01/02/2014 2255   TRIG 99 01/02/2014 2255   HDL 47 01/02/2014 2255   CHOLHDL 3.4 01/02/2014 2255   VLDL 20 01/02/2014 2255   LDLCALC 91 01/02/2014 2255   HgbA1C  Lab Results  Component Value Date   HGBA1C 6.0* 01/02/2014    Urine Drug  Screen:   No results found for this basename: labopia,  cocainscrnur,  labbenz,  amphetmu,  thcu,  labbarb    Alcohol Level:   Recent Labs Lab 01/02/14 2143  ETH <11   CT of the brain    MRI of the brain  01/02/2014     Acute nonhemorrhagic watershed territory infarcts involving the right frontal lobe. The majority these are white matter although there is a focal area of cortical infarct.  MRA of the brain  01/02/2014    Decreased caliber of the right internal carotid artery and decreased signal within the right A1 segment and MCA branches reflecting decreased profusion. This suggests a more proximal stenosis, likely at the right carotid artery bifurcation.  2D Echocardiogram   01/04/2014 pending 01/03/2014 not readable  Carotid Doppler  Right = >80% ICA stenosis. Left = 40-59% ICA stenosis. Antegrade vertebral flow.  CXR  01/03/2014    No active cardiopulmonary disease.    EKG  normal sinus rhythm. For complete results please see formal report.   Therapy Recommendations HH OT, no PT  Physical Exam   Pleasant middle aged lady not in distress.Awake alert. Afebrile. Head is nontraumatic. Neck is supple without bruit. Hearing is normal. Cardiac exam no murmur or gallop. Lungs are clear to auscultation. Distal pulses are well felt. Neurological Exam : Awake alert oriented x 3 normal speech and language. No face asymmetry. Tongue midline. No drift. Mild diminished fine finger movements on left. Orbits right over left upper extremity. Mild left grip weak.. Normal sensation . Normal coordination.  ASSESSMENT [name] is a 63 y.o. female presenting with left hand numbness. Imaging confirms a right watershed infarct. Infarct felt to be thrombotic secondary to R ICA stenosis > 80%. [facility]VS has been consulted and plans OR next week. On no antithrombotics prior to admission. Now on clopidogrel 75 mg orally every day for secondary stroke prevention. Patient with resultant left hand sensory  deficit and left mild hemiparesis. Stroke work up underway.  hypertension Hyperlipidemia, LDL 91, on no statin PTA, now on zocor, at goal LDL < 100  MVP Obesity, Body mass index is 31.26 kg/(m^2).   Hospital day # 3  TREATMENT/PLAN  Continue  clopidogrel 75 mg orally every day for secondary stroke prevention.  Stop smoking  F/u repeat 2D echo results  Once resulted, ok for discharge from stroke standpoint  Agree with plans for R CEA next week  [facility] OT  Stroke service will sign off  Follow up with Dr. Leonie Man in 2 months  Burnetta Sabin, MSN, RN, ANVP-BC, AGPCNP-BC [facility] Pager:  [phone] 01/05/2014 10:13 AM  I have personally obtained a history, examined the patient, evaluated imaging results, and formulated the assessment and plan of care. I agree with the above. Antony Contras, MD  To contact Stroke Continuity provider, please refer to [email or web address] After hours, contact General Neurology

## 2014-01-05 NOTE — Progress Notes (Signed)
[facility] PROGRESS NOTE  HEIDEE AUDI ZOX:[id number] DOB: 1951/02/11 DOA: 01/02/2014 PCP:  Melinda Crutch, MD  Assessment/Plan: Acute CVA (cerebral infarction) with left hand weakness  - MRI of the brain was positive for acute nonhemorrhagic watershed territory infarct involving the right frontal lobe  - MRA brain showed proximal stenosis, likely at the right carotid artery bifurcation.  - 2-D echo 3/17- not readable ?? Discussed in detail with Dr. Leonie Man who felt echo will not change clinical picture/management at this point -Echo dept spoke with Dr. Leretha Dykes repeat echo with Definity will not help - Carotid Dopplers showed right > E. person ICA stenosis, left 40-59% ICA stenosis  - Vascular surgery consult obtained, patient seen by Dr. only, recommended right carotid endarterectomy in 12 weeks to reduce her stroke risk.  -Neurology following, on Plavix  - Lipid panel showed cholesterol 150, LDL 91 placed on Zocor.  Hyponatremia -May be related to medications versus volume depletion -Suspect a mild degree of SIADH from patient's pulmonary process -Gentle hydration -Check TSH, morning cortisol -Discontinue HCTZ -Cymbalta may be contributing -Check urine sodium and urine creatinine Active Problems:  Esophageal reflux  - Continue PPI  Tobacco abuse  - Placed on nicotine patch  HTN (hypertension), malignant  - Currently stable, DC IV fluids  Hyperlipidemia  - Placed on Zocor  COPD exacerbation- wheezing improving  - Placed on scheduled bronchodilators, Pulmicort  - Taper IV Solu-Medrol to 40 mg every 12 hours  - Daughter states that patient is able to take prednisone po    DVT Prophylaxis: lovenox  Code Status:Full code  Family Communication:Discussed with daughter  Disposition: home when med stable Consultants:  Stroke service  Vascular surgery, Dr. early Procedures:  None Antibiotics:  Zithromax           Procedures/Studies: Dg Chest 2  View  01/03/2014   CLINICAL DATA:  Shortness of breath  EXAM: CHEST  2 VIEW  COMPARISON:  DG CHEST 2 VIEW dated 10/07/2011  FINDINGS: The heart size and mediastinal contours are within normal limits. Both lungs are clear. The visualized skeletal structures are unremarkable.  IMPRESSION: No active cardiopulmonary disease.   Electronically Signed   By: Margaree Mackintosh M.D.   On: 01/03/2014 07:49   Mr Jodene Nam Head Wo Contrast  01/02/2014   CLINICAL DATA:  Left-sided upper extremity weakness and facial droop for 1 week. Numbness.  EXAM: MRI HEAD WITHOUT CONTRAST  MRA HEAD WITHOUT CONTRAST  TECHNIQUE: Multiplanar, multiecho pulse sequences of the brain and surrounding structures were obtained without intravenous contrast. Angiographic images of the head were obtained using MRA technique without contrast.  COMPARISON:  None available.  FINDINGS: MRI HEAD FINDINGS  The diffusion-weighted images demonstrate scattered watershed type infarcts involving the right frontal lobe. These extending to the coronal radiata. T2 changes are associated with the infarct both along the cortex and white matter tracts.  There is no other significant white matter disease. Flow is present in the major intracranial arteries. The ventricles are of normal size. No significant extra-axial fluid collection is present.  The globes and orbits are intact. The paranasal sinuses are clear. Minimal fluid is present in the left mastoid air cells. No obstructing nasopharyngeal lesion is evident.  MRA HEAD FINDINGS  The right internal carotid artery is decreased in caliber relative to the left. No focal stenosis is evident. There is significant decreased signal in the right A1 segment compared to the left, likely reflecting decreased perfusion. A 2 mm infundibulum is present at the  left posterior communicating artery. The left A1 segment is normal. The anterior communicating artery is patent. The left M1 segment and bifurcation is within normal limits. There  is decreased caliber to the right M1 segment. Signal intensity in the right MCA bifurcations is decreased relative to the left. No focal stenosis is evident.  The right vertebral artery is hypoplastic and terminates at the PICA. The left vertebral artery feeds the basilar artery. There is no focal stenosis. The left PICA origin is visualized and normal. Both posterior cerebral arteries originate from the basilar tip. The PCA branch vessels are within normal limits.  IMPRESSION: 1. Acute nonhemorrhagic watershed territory infarcts involving the right frontal lobe. The majority these are white matter although there is a focal area of cortical infarct. 2. Decreased caliber of the right internal carotid artery and decreased signal within the right A1 segment and MCA branches reflecting decreased profusion. This suggests a more proximal stenosis, likely at the right carotid artery bifurcation. These results were called by telephone at the time of interpretation on 01/02/2014 at 8:35 PM to Dr. Phillips Odor , who verbally acknowledged these results.   Electronically Signed   By: Gennette Pac M.D.   On: 01/02/2014 20:35   Mr Brain Wo Contrast  01/02/2014   CLINICAL DATA:  Left-sided upper extremity weakness and facial droop for 1 week. Numbness.  EXAM: MRI HEAD WITHOUT CONTRAST  MRA HEAD WITHOUT CONTRAST  TECHNIQUE: Multiplanar, multiecho pulse sequences of the brain and surrounding structures were obtained without intravenous contrast. Angiographic images of the head were obtained using MRA technique without contrast.  COMPARISON:  None available.  FINDINGS: MRI HEAD FINDINGS  The diffusion-weighted images demonstrate scattered watershed type infarcts involving the right frontal lobe. These extending to the coronal radiata. T2 changes are associated with the infarct both along the cortex and white matter tracts.  There is no other significant white matter disease. Flow is present in the major intracranial arteries.  The ventricles are of normal size. No significant extra-axial fluid collection is present.  The globes and orbits are intact. The paranasal sinuses are clear. Minimal fluid is present in the left mastoid air cells. No obstructing nasopharyngeal lesion is evident.  MRA HEAD FINDINGS  The right internal carotid artery is decreased in caliber relative to the left. No focal stenosis is evident. There is significant decreased signal in the right A1 segment compared to the left, likely reflecting decreased perfusion. A 2 mm infundibulum is present at the left posterior communicating artery. The left A1 segment is normal. The anterior communicating artery is patent. The left M1 segment and bifurcation is within normal limits. There is decreased caliber to the right M1 segment. Signal intensity in the right MCA bifurcations is decreased relative to the left. No focal stenosis is evident.  The right vertebral artery is hypoplastic and terminates at the PICA. The left vertebral artery feeds the basilar artery. There is no focal stenosis. The left PICA origin is visualized and normal. Both posterior cerebral arteries originate from the basilar tip. The PCA branch vessels are within normal limits.  IMPRESSION: 1. Acute nonhemorrhagic watershed territory infarcts involving the right frontal lobe. The majority these are white matter although there is a focal area of cortical infarct. 2. Decreased caliber of the right internal carotid artery and decreased signal within the right A1 segment and MCA branches reflecting decreased profusion. This suggests a more proximal stenosis, likely at the right carotid artery bifurcation. These results were called by telephone  at the time of interpretation on 01/02/2014 at 8:35 PM to Dr. Ellison Carwin , who verbally acknowledged these results.   Electronically Signed   By: Lawrence Santiago M.D.   On: 01/02/2014 20:35         Subjective: Patient is breathing better. She denies any  headache, chest pain, nausea, vomiting, diarrhea, abdominal pain. No visual disturbance. No worsening unilateral weakness.  Objective: Filed Vitals:   01/05/14 0139 01/05/14 0645 01/05/14 0918 01/05/14 1026  BP:  133/69  132/62  Pulse:  76  80  Temp:  97.9 F (36.6 C)  97.7 F (36.5 C)  TempSrc:    Oral  Resp:  20  20  Height:      Weight:      SpO2: 97% 95% 93% 96%    Intake/Output Summary (Last 24 hours) at 01/05/14 1348 Last data filed at 01/04/14 1700  Gross per 24 hour  Intake    360 ml  Output      0 ml  Net    360 ml   Weight change:  Exam:   General:  Pt is alert, follows commands appropriately, not in acute distress  HEENT: No icterus, No thrush,  [place]/AT  Cardiovascular: RRR, S1/S2, no rubs, no gallops  Respiratory: Minimal basilar rales. Minimal basilar wheeze. Good air movement.  Abdomen: Soft/+BS, non tender, non distended, no guarding  Extremities: No edema, No lymphangitis, No petechiae, No rashes, no synovitis  Data Reviewed: Basic Metabolic Panel:  Recent Labs Lab 01/02/14 1808 01/02/14 2143 01/02/14 2153 01/03/14 0840 01/04/14 0530 01/05/14 0930  NA 128* 128* 130* 126* 126* 123*  K 4.2 4.5 4.2 3.8 4.1 4.5  CL 94* 90* 95* 89* 89* 85*  CO2 25 22  --  21 20 22   GLUCOSE 103* 117* 119* 138* 160* 168*  BUN 14 14 14 12 15 19   CREATININE 0.98 0.92 1.10 0.81 0.85 0.82  CALCIUM 10.1 10.0  --  9.9 9.9 9.8   Liver Function Tests:  Recent Labs Lab 01/02/14 1808 01/02/14 2143  AST 18 21  ALT 16 16  ALKPHOS 49 51  BILITOT 0.3 0.3  PROT 7.8 8.0  ALBUMIN 4.8 4.5   No results found for this basename: LIPASE, AMYLASE,  in the last 168 hours No results found for this basename: AMMONIA,  in the last 168 hours CBC:  Recent Labs Lab 01/02/14 1825 01/02/14 2143 01/02/14 2153 01/03/14 0840  WBC 7.0 8.6  --  6.7  NEUTROABS  --  6.2  --   --   HGB 12.6 12.3 13.6 11.3*  HCT 38.9 35.1* 40.0 32.2*  MCV 96.0 90.9  --  89.9  PLT  --  305  --   279   Cardiac Enzymes: No results found for this basename: CKTOTAL, CKMB, CKMBINDEX, TROPONINI,  in the last 168 hours BNP: No components found with this basename: POCBNP,  CBG: No results found for this basename: GLUCAP,  in the last 168 hours  No results found for this or any previous visit (from the past 240 hour(s)).   Scheduled Meds: . amLODipine  10 mg Oral Daily  . atenolol  50 mg Oral Daily  . azithromycin  500 mg Oral Daily  . benzonatate  100 mg Oral TID  . budesonide  0.25 mg Nebulization BID  . cloNIDine  0.2 mg Oral BID  . clopidogrel  75 mg Oral Q breakfast  . DULoxetine  60 mg Oral Daily  . enoxaparin (LOVENOX) injection  40 mg Subcutaneous Q24H  . fluticasone  2 spray Each Nare Daily  . ipratropium-albuterol  3 mL Inhalation BID  . isosorbide mononitrate  30 mg Oral BID  . lisinopril  20 mg Oral Daily  . methylPREDNISolone (SOLU-MEDROL) injection  40 mg Intravenous Q12H  . nicotine  21 mg Transdermal Q24H  . pantoprazole  40 mg Oral Daily  . simvastatin  10 mg Oral q1800   Continuous Infusions:    Montague Corella, DO  [facility] Pager [phone]  If 7PM-7AM, please contact night-coverage [email or web address] Password [email or web address] 01/05/2014, 1:48 PM   LOS: 3 days

## 2014-01-06 ENCOUNTER — Inpatient Hospital Stay (HOSPITAL_COMMUNITY): Payer: No Typology Code available for payment source

## 2014-01-06 LAB — BASIC METABOLIC PANEL
BUN: 20 mg/dL (ref 6–23)
BUN: 20 mg/dL (ref 6–23)
CHLORIDE: 89 meq/L — AB (ref 96–112)
CO2: 20 mEq/L (ref 19–32)
CO2: 21 meq/L (ref 19–32)
Calcium: 9.9 mg/dL (ref 8.4–10.5)
Calcium: 9.6 mg/dL (ref 8.4–10.5)
Chloride: 90 mEq/L — ABNORMAL LOW (ref 96–112)
Creatinine, Ser: 0.81 mg/dL (ref 0.50–1.10)
Creatinine, Ser: 0.79 mg/dL (ref 0.50–1.10)
GFR calc non Af Amer: 86 mL/min — ABNORMAL LOW (ref >90)
GFR, EST AFRICAN AMERICAN: 87 mL/min — AB (ref >90)
GFR, EST NON AFRICAN AMERICAN: 75 mL/min — AB (ref >90)
Glucose, Bld: 139 mg/dL — ABNORMAL HIGH (ref 70–99)
Glucose, Bld: 134 mg/dL — ABNORMAL HIGH (ref 70–99)
Potassium: 4.7 mEq/L (ref 3.7–5.3)
Potassium: 4.7 mEq/L (ref 3.7–5.3)
SODIUM: 125 meq/L — AB (ref 137–147)
SODIUM: 125 meq/L — AB (ref 137–147)

## 2014-01-06 MED ORDER — PREDNISONE 50 MG PO TABS
50.0000 mg | ORAL_TABLET | Freq: Every day | ORAL | Status: DC
  Administered 2014-01-07: 50 mg via ORAL
  Filled 2014-01-06 (×2): qty 1

## 2014-01-06 NOTE — Consult Note (Signed)
[place] Student Nurse NCAT/ Resa Miner EdD

## 2014-01-06 NOTE — Progress Notes (Signed)
Subjective: Interval History: none.. no new neurologic deficits. Await results of 2-D echocardiogram. Apparently this was not recorded are not done. Making a progress regarding her left hand weakness.  Objective: Vital signs in last 24 hours: Temp:  [97.5 F (36.4 C)-98.4 F (36.9 C)] 98.1 F (36.7 C) (03/19 0559) Pulse Rate:  [66-80] 75 (03/19 0559) Resp:  [18-20] 20 (03/19 0559) BP: (107-146)/(54-80) 146/80 mmHg (03/19 0559) SpO2:  [93 %-99 %] 97 % (03/19 0559)  Intake/Output from previous day:   Intake/Output this shift:    No change. Clumsiness and decreased grip strength in her left hand  Lab Results:  Recent Labs  01/03/14 0840  WBC 6.7  HGB 11.3*  HCT 32.2*  PLT 279   BMET  Recent Labs  01/05/14 0930 01/06/14 0650  NA 123* 125*  K 4.5 4.7  CL 85* 89*  CO2 22 20  GLUCOSE 168* 139*  BUN 19 20  CREATININE 0.82 0.81  CALCIUM 9.8 9.9    Studies/Results: Dg Chest 2 View  01/03/2014   CLINICAL DATA:  Shortness of breath  EXAM: CHEST  2 VIEW  COMPARISON:  DG CHEST 2 VIEW dated 10/07/2011  FINDINGS: The heart size and mediastinal contours are within normal limits. Both lungs are clear. The visualized skeletal structures are unremarkable.  IMPRESSION: No active cardiopulmonary disease.   Electronically Signed   By: Margaree Mackintosh M.D.   On: 01/03/2014 07:49   Mr Jodene Nam Head Wo Contrast  01/02/2014   CLINICAL DATA:  Left-sided upper extremity weakness and facial droop for 1 week. Numbness.  EXAM: MRI HEAD WITHOUT CONTRAST  MRA HEAD WITHOUT CONTRAST  TECHNIQUE: Multiplanar, multiecho pulse sequences of the brain and surrounding structures were obtained without intravenous contrast. Angiographic images of the head were obtained using MRA technique without contrast.  COMPARISON:  None available.  FINDINGS: MRI HEAD FINDINGS  The diffusion-weighted images demonstrate scattered watershed type infarcts involving the right frontal lobe. These extending to the coronal radiata. T2  changes are associated with the infarct both along the cortex and white matter tracts.  There is no other significant white matter disease. Flow is present in the major intracranial arteries. The ventricles are of normal size. No significant extra-axial fluid collection is present.  The globes and orbits are intact. The paranasal sinuses are clear. Minimal fluid is present in the left mastoid air cells. No obstructing nasopharyngeal lesion is evident.  MRA HEAD FINDINGS  The right internal carotid artery is decreased in caliber relative to the left. No focal stenosis is evident. There is significant decreased signal in the right A1 segment compared to the left, likely reflecting decreased perfusion. A 2 mm infundibulum is present at the left posterior communicating artery. The left A1 segment is normal. The anterior communicating artery is patent. The left M1 segment and bifurcation is within normal limits. There is decreased caliber to the right M1 segment. Signal intensity in the right MCA bifurcations is decreased relative to the left. No focal stenosis is evident.  The right vertebral artery is hypoplastic and terminates at the PICA. The left vertebral artery feeds the basilar artery. There is no focal stenosis. The left PICA origin is visualized and normal. Both posterior cerebral arteries originate from the basilar tip. The PCA branch vessels are within normal limits.  IMPRESSION: 1. Acute nonhemorrhagic watershed territory infarcts involving the right frontal lobe. The majority these are white matter although there is a focal area of cortical infarct. 2. Decreased caliber of the right  internal carotid artery and decreased signal within the right A1 segment and MCA branches reflecting decreased profusion. This suggests a more proximal stenosis, likely at the right carotid artery bifurcation. These results were called by telephone at the time of interpretation on 01/02/2014 at 8:35 PM to Dr. Ellison Carwin ,  who verbally acknowledged these results.   Electronically Signed   By: Lawrence Santiago M.D.   On: 01/02/2014 20:35   Mr Brain Wo Contrast  01/02/2014   CLINICAL DATA:  Left-sided upper extremity weakness and facial droop for 1 week. Numbness.  EXAM: MRI HEAD WITHOUT CONTRAST  MRA HEAD WITHOUT CONTRAST  TECHNIQUE: Multiplanar, multiecho pulse sequences of the brain and surrounding structures were obtained without intravenous contrast. Angiographic images of the head were obtained using MRA technique without contrast.  COMPARISON:  None available.  FINDINGS: MRI HEAD FINDINGS  The diffusion-weighted images demonstrate scattered watershed type infarcts involving the right frontal lobe. These extending to the coronal radiata. T2 changes are associated with the infarct both along the cortex and white matter tracts.  There is no other significant white matter disease. Flow is present in the major intracranial arteries. The ventricles are of normal size. No significant extra-axial fluid collection is present.  The globes and orbits are intact. The paranasal sinuses are clear. Minimal fluid is present in the left mastoid air cells. No obstructing nasopharyngeal lesion is evident.  MRA HEAD FINDINGS  The right internal carotid artery is decreased in caliber relative to the left. No focal stenosis is evident. There is significant decreased signal in the right A1 segment compared to the left, likely reflecting decreased perfusion. A 2 mm infundibulum is present at the left posterior communicating artery. The left A1 segment is normal. The anterior communicating artery is patent. The left M1 segment and bifurcation is within normal limits. There is decreased caliber to the right M1 segment. Signal intensity in the right MCA bifurcations is decreased relative to the left. No focal stenosis is evident.  The right vertebral artery is hypoplastic and terminates at the PICA. The left vertebral artery feeds the basilar artery.  There is no focal stenosis. The left PICA origin is visualized and normal. Both posterior cerebral arteries originate from the basilar tip. The PCA branch vessels are within normal limits.  IMPRESSION: 1. Acute nonhemorrhagic watershed territory infarcts involving the right frontal lobe. The majority these are white matter although there is a focal area of cortical infarct. 2. Decreased caliber of the right internal carotid artery and decreased signal within the right A1 segment and MCA branches reflecting decreased profusion. This suggests a more proximal stenosis, likely at the right carotid artery bifurcation. These results were called by telephone at the time of interpretation on 01/02/2014 at 8:35 PM to Dr. Ellison Carwin , who verbally acknowledged these results.   Electronically Signed   By: Lawrence Santiago M.D.   On: 01/02/2014 20:35   Anti-infectives: Anti-infectives   Start     Dose/Rate Route Frequency Ordered Stop   01/05/14 1000  azithromycin (ZITHROMAX) tablet 500 mg     500 mg Oral Daily 01/05/14 0858     01/04/14 0000  azithromycin (ZITHROMAX) 500 MG tablet     500 mg Oral Daily 01/04/14 1012     01/03/14 1200  azithromycin (ZITHROMAX) 500 mg in dextrose 5 % 250 mL IVPB  Status:  Discontinued     500 mg 250 mL/hr over 60 Minutes Intravenous Every 24 hours 01/03/14 1056 01/05/14 0858  Assessment/Plan: s/p * No surgery found * Recovering from right brain stroke related to critical stenosis right internal carotid artery. For discharge after correction of hyponatremia. Will be readmitted in approximately one week for right carotid endarterectomy   LOS: 4 days   Shannon Roberts 01/06/2014, 8:31 AM

## 2014-01-06 NOTE — Progress Notes (Signed)
Patient appears anxious c/o unable to breath. Respiratory therapist at bedside. Xanax administered. Lungs sounds a little wet. IVF NS at 100. MD paged with order to stop IVF and chest X-Ray. Emotional support given. Patient now verbalized that she feel better. Will continue to monitor.  Ave Filter, RN

## 2014-01-06 NOTE — Progress Notes (Signed)
[facility] PROGRESS NOTE  Shannon Roberts [id number] DOB: 1951/01/14 DOA: 01/02/2014 PCP:  Melinda Crutch, MD  Assessment/Plan: Acute CVA (cerebral infarction) with left hand weakness  - MRI of the brain was positive for acute nonhemorrhagic watershed territory infarct involving the right frontal lobe  - MRA brain showed proximal stenosis, likely at the right carotid artery bifurcation.  - 2-D echo 3/17- not readable ?? Discussed in detail with Dr. Leonie Man who felt echo will not change clinical picture/management at this point  -Echo dept spoke with Dr. Leretha Dykes repeat echo with Definity will not help  - Carotid Dopplers showed right > E. person ICA stenosis, left 40-59% ICA stenosis  - Vascular surgery consult obtained, patient seen by Dr. only, recommended right carotid endarterectomy in 12 weeks to reduce her stroke risk.  -Neurology following, on Plavix  - Lipid panel showed cholesterol 150, LDL 91 placed on Zocor.  Hyponatremia  -May be related to medications versus volume depletion  -Suspect a mild degree of SIADH from patient's pulmonary process  -continue IV Gentle hydration-->Na slowly improving  -Check TSH, morning cortisol--results pending  -Discontinue HCTZ  -Cymbalta may be contributing  -Check urine sodium and urine creatinine-->FeNa <1%  Active Problems:  Esophageal reflux  - Continue PPI  Tobacco abuse  - Placed on nicotine patch  HTN (hypertension), malignant  - Currently stable, Hyperlipidemia  - Placed on Zocor  COPD exacerbation- wheezing improving  - Placed on scheduled bronchodilators, Pulmicort  - change IV to po steroids in am - Daughter states that patient is able to take prednisone po  DVT Prophylaxis: lovenox  Code Status:Full code  Family Communication:Discussed with daughter  Disposition: home when med stable  Consultants:  Stroke service  Vascular surgery, Dr. Donnetta Hutching Procedures:  None Antibiotics:  Zithromax 01/03/14>>>   Family  Communication:   Daughter updated on phone Disposition Plan:   Home when medically stable       Procedures/Studies: Dg Chest 2 View  01/03/2014   CLINICAL DATA:  Shortness of breath  EXAM: CHEST  2 VIEW  COMPARISON:  DG CHEST 2 VIEW dated 10/07/2011  FINDINGS: The heart size and mediastinal contours are within normal limits. Both lungs are clear. The visualized skeletal structures are unremarkable.  IMPRESSION: No active cardiopulmonary disease.   Electronically Signed   By: Margaree Mackintosh M.D.   On: 01/03/2014 07:49   Mr Jodene Nam Head Wo Contrast  01/02/2014   CLINICAL DATA:  Left-sided upper extremity weakness and facial droop for 1 week. Numbness.  EXAM: MRI HEAD WITHOUT CONTRAST  MRA HEAD WITHOUT CONTRAST  TECHNIQUE: Multiplanar, multiecho pulse sequences of the brain and surrounding structures were obtained without intravenous contrast. Angiographic images of the head were obtained using MRA technique without contrast.  COMPARISON:  None available.  FINDINGS: MRI HEAD FINDINGS  The diffusion-weighted images demonstrate scattered watershed type infarcts involving the right frontal lobe. These extending to the coronal radiata. T2 changes are associated with the infarct both along the cortex and white matter tracts.  There is no other significant white matter disease. Flow is present in the major intracranial arteries. The ventricles are of normal size. No significant extra-axial fluid collection is present.  The globes and orbits are intact. The paranasal sinuses are clear. Minimal fluid is present in the left mastoid air cells. No obstructing nasopharyngeal lesion is evident.  MRA HEAD FINDINGS  The right internal carotid artery is decreased in caliber relative to the left. No focal stenosis is evident. There is significant  decreased signal in the right A1 segment compared to the left, likely reflecting decreased perfusion. A 2 mm infundibulum is present at the left posterior communicating artery. The  left A1 segment is normal. The anterior communicating artery is patent. The left M1 segment and bifurcation is within normal limits. There is decreased caliber to the right M1 segment. Signal intensity in the right MCA bifurcations is decreased relative to the left. No focal stenosis is evident.  The right vertebral artery is hypoplastic and terminates at the PICA. The left vertebral artery feeds the basilar artery. There is no focal stenosis. The left PICA origin is visualized and normal. Both posterior cerebral arteries originate from the basilar tip. The PCA branch vessels are within normal limits.  IMPRESSION: 1. Acute nonhemorrhagic watershed territory infarcts involving the right frontal lobe. The majority these are white matter although there is a focal area of cortical infarct. 2. Decreased caliber of the right internal carotid artery and decreased signal within the right A1 segment and MCA branches reflecting decreased profusion. This suggests a more proximal stenosis, likely at the right carotid artery bifurcation. These results were called by telephone at the time of interpretation on 01/02/2014 at 8:35 PM to Dr. Ellison Carwin , who verbally acknowledged these results.   Electronically Signed   By: Lawrence Santiago M.D.   On: 01/02/2014 20:35   Mr Brain Wo Contrast  01/02/2014   CLINICAL DATA:  Left-sided upper extremity weakness and facial droop for 1 week. Numbness.  EXAM: MRI HEAD WITHOUT CONTRAST  MRA HEAD WITHOUT CONTRAST  TECHNIQUE: Multiplanar, multiecho pulse sequences of the brain and surrounding structures were obtained without intravenous contrast. Angiographic images of the head were obtained using MRA technique without contrast.  COMPARISON:  None available.  FINDINGS: MRI HEAD FINDINGS  The diffusion-weighted images demonstrate scattered watershed type infarcts involving the right frontal lobe. These extending to the coronal radiata. T2 changes are associated with the infarct both along  the cortex and white matter tracts.  There is no other significant white matter disease. Flow is present in the major intracranial arteries. The ventricles are of normal size. No significant extra-axial fluid collection is present.  The globes and orbits are intact. The paranasal sinuses are clear. Minimal fluid is present in the left mastoid air cells. No obstructing nasopharyngeal lesion is evident.  MRA HEAD FINDINGS  The right internal carotid artery is decreased in caliber relative to the left. No focal stenosis is evident. There is significant decreased signal in the right A1 segment compared to the left, likely reflecting decreased perfusion. A 2 mm infundibulum is present at the left posterior communicating artery. The left A1 segment is normal. The anterior communicating artery is patent. The left M1 segment and bifurcation is within normal limits. There is decreased caliber to the right M1 segment. Signal intensity in the right MCA bifurcations is decreased relative to the left. No focal stenosis is evident.  The right vertebral artery is hypoplastic and terminates at the PICA. The left vertebral artery feeds the basilar artery. There is no focal stenosis. The left PICA origin is visualized and normal. Both posterior cerebral arteries originate from the basilar tip. The PCA branch vessels are within normal limits.  IMPRESSION: 1. Acute nonhemorrhagic watershed territory infarcts involving the right frontal lobe. The majority these are white matter although there is a focal area of cortical infarct. 2. Decreased caliber of the right internal carotid artery and decreased signal within the right A1 segment and MCA  branches reflecting decreased profusion. This suggests a more proximal stenosis, likely at the right carotid artery bifurcation. These results were called by telephone at the time of interpretation on 01/02/2014 at 8:35 PM to Dr. Ellison Carwin , who verbally acknowledged these results.    Electronically Signed   By: Lawrence Santiago M.D.   On: 01/02/2014 20:35         Subjective: Patient continues to have dyspnea on exertion. She denies any fevers, chills, chest discomfort, nausea, vomiting, diarrhea, abdominal pain, dizziness, dysuria, hematuria, hematochezia, melena.  Objective: Filed Vitals:   01/06/14 0123 01/06/14 0559 01/06/14 0842 01/06/14 1000  BP: 107/66 146/80 111/65 126/63  Pulse: 71 75 70 74  Temp: 98.4 F (36.9 C) 98.1 F (36.7 C) 97.6 F (36.4 C) 97.6 F (36.4 C)  TempSrc: Oral Oral Oral Oral  Resp: 18 20 14 14   Height:      Weight:      SpO2: 96% 97% 97% 98%    Intake/Output Summary (Last 24 hours) at 01/06/14 1230 Last data filed at 01/06/14 0900  Gross per 24 hour  Intake    300 ml  Output      0 ml  Net    300 ml   Weight change:  Exam:   General:  Pt is alert, follows commands appropriately, not in acute distress  HEENT: No icterus, No thrush,  [place]/AT  Cardiovascular: RRR, S1/S2, no rubs, no gallops  Respiratory: Minimal right basilar wheezes. Good air movement. Left clear to auscultation.  Abdomen: Soft/+BS, non tender, non distended, no guarding  Extremities: trace LE edema, No lymphangitis, No petechiae, No rashes, no synovitis  Data Reviewed: Basic Metabolic Panel:  Recent Labs Lab 01/02/14 2143 01/02/14 2153 01/03/14 0840 01/04/14 0530 01/05/14 0930 01/06/14 0650  NA 128* 130* 126* 126* 123* 125*  K 4.5 4.2 3.8 4.1 4.5 4.7  CL 90* 95* 89* 89* 85* 89*  CO2 22  --  21 20 22 20   GLUCOSE 117* 119* 138* 160* 168* 139*  BUN 14 14 12 15 19 20   CREATININE 0.92 1.10 0.81 0.85 0.82 0.81  CALCIUM 10.0  --  9.9 9.9 9.8 9.9   Liver Function Tests:  Recent Labs Lab 01/02/14 1808 01/02/14 2143  AST 18 21  ALT 16 16  ALKPHOS 49 51  BILITOT 0.3 0.3  PROT 7.8 8.0  ALBUMIN 4.8 4.5   No results found for this basename: LIPASE, AMYLASE,  in the last 168 hours No results found for this basename: AMMONIA,  in the last  168 hours CBC:  Recent Labs Lab 01/02/14 1825 01/02/14 2143 01/02/14 2153 01/03/14 0840  WBC 7.0 8.6  --  6.7  NEUTROABS  --  6.2  --   --   HGB 12.6 12.3 13.6 11.3*  HCT 38.9 35.1* 40.0 32.2*  MCV 96.0 90.9  --  89.9  PLT  --  305  --  279   Cardiac Enzymes: No results found for this basename: CKTOTAL, CKMB, CKMBINDEX, TROPONINI,  in the last 168 hours BNP: No components found with this basename: POCBNP,  CBG: No results found for this basename: GLUCAP,  in the last 168 hours  No results found for this or any previous visit (from the past 240 hour(s)).   Scheduled Meds: . amLODipine  10 mg Oral Daily  . atenolol  50 mg Oral Daily  . azithromycin  500 mg Oral Daily  . benzonatate  100 mg Oral TID  . budesonide  0.25 mg Nebulization BID  . cloNIDine  0.2 mg Oral BID  . clopidogrel  75 mg Oral Q breakfast  . DULoxetine  60 mg Oral Daily  . enoxaparin (LOVENOX) injection  40 mg Subcutaneous Q24H  . fluticasone  2 spray Each Nare Daily  . ipratropium-albuterol  3 mL Inhalation BID  . isosorbide mononitrate  30 mg Oral BID  . lisinopril  20 mg Oral Daily  . methylPREDNISolone (SOLU-MEDROL) injection  40 mg Intravenous Q12H  . nicotine  21 mg Transdermal Q24H  . pantoprazole  40 mg Oral Daily  . simvastatin  10 mg Oral q1800   Continuous Infusions: . sodium chloride 75 mL/hr at 01/05/14 1511     Hensley Treat, DO  [facility] Pager [phone]  If 7PM-7AM, please contact night-coverage [email or web address] Password [email or web address] 01/06/2014, 12:30 PM   LOS: 4 days

## 2014-01-06 NOTE — Progress Notes (Signed)
Patient's daughter called Probation officer and asked to speak with the MD. Konrad Dolores paged MD to let him know.

## 2014-01-07 ENCOUNTER — Encounter (HOSPITAL_COMMUNITY): Payer: Self-pay

## 2014-01-07 ENCOUNTER — Other Ambulatory Visit: Payer: Self-pay | Admitting: *Deleted

## 2014-01-07 LAB — CBC
HCT: 29.4 % — ABNORMAL LOW (ref 36.0–46.0)
HEMOGLOBIN: 10.5 g/dL — AB (ref 12.0–15.0)
MCH: 32 pg (ref 26.0–34.0)
MCHC: 35.7 g/dL (ref 30.0–36.0)
MCV: 89.6 fL (ref 78.0–100.0)
Platelets: 308 10*3/uL (ref 150–400)
RBC: 3.28 MIL/uL — ABNORMAL LOW (ref 3.87–5.11)
RDW: 12.3 % (ref 11.5–15.5)
WBC: 10.3 10*3/uL (ref 4.0–10.5)

## 2014-01-07 LAB — BASIC METABOLIC PANEL
BUN: 19 mg/dL (ref 6–23)
CO2: 24 meq/L (ref 19–32)
Calcium: 9.5 mg/dL (ref 8.4–10.5)
Chloride: 93 mEq/L — ABNORMAL LOW (ref 96–112)
Creatinine, Ser: 0.8 mg/dL (ref 0.50–1.10)
GFR calc Af Amer: 88 mL/min — ABNORMAL LOW (ref >90)
GFR, EST NON AFRICAN AMERICAN: 76 mL/min — AB (ref >90)
GLUCOSE: 121 mg/dL — AB (ref 70–99)
POTASSIUM: 4.4 meq/L (ref 3.7–5.3)
Sodium: 130 mEq/L — ABNORMAL LOW (ref 137–147)

## 2014-01-07 LAB — CORTISOL-AM, BLOOD: Cortisol - AM: 3.2 ug/dL — ABNORMAL LOW (ref 4.3–22.4)

## 2014-01-07 LAB — TSH: TSH: 1.277 u[IU]/mL (ref 0.350–4.500)

## 2014-01-07 MED ORDER — LISINOPRIL 20 MG PO TABS: 20.0000 mg | ORAL_TABLET | Freq: Every day | ORAL | Status: DC

## 2014-01-07 MED ORDER — ALPRAZOLAM 0.25 MG PO TABS
0.2500 mg | ORAL_TABLET | Freq: Two times a day (BID) | ORAL | Status: DC | PRN
Start: 2014-01-07 — End: 2014-01-07
  Administered 2014-01-07: 0.25 mg via ORAL
  Filled 2014-01-07: qty 1

## 2014-01-07 MED ORDER — PREDNISONE 10 MG PO TABS: ORAL_TABLET | ORAL | Status: DC

## 2014-01-07 NOTE — Progress Notes (Signed)
Pt states that she came into the hospital with a flip cell phone and did not have it with her when she came to [place]. Called [facility] to see if they had the cell phone in their lost and found. They did not have it. Pt notified.

## 2014-01-07 NOTE — Progress Notes (Signed)
Occupational Therapy Treatment Patient Details Name: Shannon Roberts MRN: [id number] DOB: [birth year] Today's Date: 01/07/2014 Time: 2751-7001 OT Time Calculation (min): 24 min  OT Assessment / Plan / Recommendation  History of present illness Patient is 63 year old female with hypertension, hyperlipidemia, COPD from long history of smoking, anxiety, depression who presented initially to urgent care with main concern of sudden onset of left hand weakness. She was sent for MRI of the brain which revealed acute stroke and she was transferred to [facility] for further evaluation. Patient explains that her left hand and wrist weakness and numbness started 6 days prior to admission and she went to see chiropractor.  MRI revealed Acute nonhemorrhagic watershed territory infarcts involving the right frontal lobe.    OT comments  Pt with significant improvement with Lt hand function.  She continues with deficits with isolated finger extension and coordination.   Her HEP was updated.  Spoke with pt re: OPOT, and she feels like she can continue to work on her HEP and function at home, and if she still has deficits after upcoming surgery, she will pursue OPOT (instructed to speak with her primary care MD).  Follow Up Recommendations  Supervision - Intermittent;Home health OT    Barriers to Discharge       Equipment Recommendations  None recommended by OT    Recommendations for Other Services    Frequency Min 3X/week   Progress towards OT Goals Progress towards OT goals: Goals met/education completed, patient discharged from [facility] Discharge plan remains appropriate    Precautions / Restrictions     Pertinent Vitals/Pain     ADL  ADL Comments: Worked on [facility].  Pt now able to extend all digits and able to opose digit 5.  She is able to turn cup clockwise and counterclockwise in Lt hand.  She continues to demonstrate difficulty with isolated digit extension - worked on activities to improve this,  and instructed her in activities to perform at home.  She  is able to toss ball in Lt hand with min drops and able to toss from Lt hand to Rt hand with min drops.  Able to toss ball in Lt hand while ambulating but fatigues rapidly.  Updated HEP with her.    OT Diagnosis:    OT Problem List:   OT Treatment Interventions:     OT Goals(current goals can now be found in the care plan section) ADL Goals Pt Will Perform Upper Body Dressing: with modified independence;sitting Pt Will Perform Lower Body Dressing: with modified independence;sit to/from stand Pt Will Perform Toileting - Clothing Manipulation and hygiene: with modified independence;sit to/from stand Pt/caregiver will Perform Home Exercise Program: Increased ROM;Left upper extremity;Independently Additional ADL Goal #1: Pt will demonstrate gross grasp and release of Lt UE in prep for use of Lt. UE in ADLs  Visit Information  Last OT Received On: 01/07/14 Assistance Needed: +1 History of Present Illness: Patient is 63 year old female with hypertension, hyperlipidemia, COPD from long history of smoking, anxiety, depression who presented initially to urgent care with main concern of sudden onset of left hand weakness. She was sent for MRI of the brain which revealed acute stroke and she was transferred to [facility] for further evaluation. Patient explains that her left hand and wrist weakness and numbness started 6 days prior to admission and she went to see chiropractor.  MRI revealed Acute nonhemorrhagic watershed territory infarcts involving the right frontal lobe.     Subjective Data  Prior Functioning       Cognition  Cognition Arousal/Alertness: Awake/alert Behavior During Therapy: WFL for tasks assessed/performed Overall Cognitive Status: Within Functional Limits for tasks assessed    Mobility  Bed Mobility Overal bed mobility: Independent Transfers Overall transfer level: Independent    Exercises      Balance     End of Session OT - End of Session Activity Tolerance: Patient tolerated treatment well Patient left: in bed;with call bell/phone within reach  [place], Garlan Drewes M 01/07/2014, 3:34 PM

## 2014-01-07 NOTE — Discharge Summary (Signed)
Physician Discharge Summary  ETTIE KRONTZ UDJ:[id number] DOB: May 22, 1951 DOA: 01/02/2014  PCP:  Melinda Crutch, MD  Admit date: 01/02/2014 Discharge date: 01/07/2014  Recommendations for Outpatient Follow-up:  1. Pt will need to follow up with PCP in 2 weeks post discharge 2. Please obtain BMP to evaluate electrolytes and kidney function 3. Please also check CBC to evaluate Hg and Hct levels   Discharge Diagnoses:  Principal Problem:   CVA (cerebral infarction) Active Problems:   Esophageal reflux   Tobacco abuse   HTN (hypertension), malignant   Hyperlipidemia   Stroke   COPD exacerbation   Hyponatremia Acute CVA (cerebral infarction) with left hand weakness  - MRI of the brain was positive for acute nonhemorrhagic watershed territory infarct involving the right frontal lobe  - MRA brain showed proximal stenosis, likely at the right carotid artery bifurcation.  - 2-D echo 3/17- not readable ?? Discussed in detail with Dr. Leonie Man who felt echo will not change clinical picture/management at this point  -Echo dept spoke with Dr. Leretha Dykes repeat echo with Definity will not help  - Carotid Dopplers showed right > E. person ICA stenosis, left 40-59% ICA stenosis  - Vascular surgery consult obtained, patient seen by Dr. only, recommended right carotid endarterectomy  -Patient will followup with Dr. Donnetta Hutching who will arrange carotid endarterectomy  -Neurology following, started Plavix  - Lipid panel showed cholesterol 150, LDL 91 placed on Zocor.  Hyponatremia  -May be related to medications versus volume depletion  -Suspect a mild degree of SIADH from patient's pulmonary process  -continue IV Gentle hydration-->Na continued to improve with discontinuation of HCTZ IV normal saline -Sodium 130 on the day of discharge -Check TSH--1.277 -morning cortisol--results not accurate as the patient is on prednisone -Discontinue HCTZ  -Cymbalta may be contributing  -Check urine sodium and urine  creatinine-->FeNa <1%  -Patient was instructed to remain off of HCTZ after discharge Dyspnea -The patient had multiple episodes of dyspnea without true tachypnea -The patient did not have any hypoxemia -Chest x-ray was negative for any acute process -It was felt that this may have been related to the patient's anxiety -Symptoms improved with Xanax -She gradually improved as well with prednisone for treatment of her COPD exacerbation- Esophageal reflux  - Continue PPI  Tobacco abuse  - Placed on nicotine patch  HTN (hypertension), malignant  - Currently stable,  -Combination pill of Zestoretic will be discontinued -Patient will start lisinopril 20 mg daily and in lieu of Zestoretic Hyperlipidemia  - Placed on Zocor  COPD exacerbation- wheezing improving  - Placed on scheduled bronchodilators, Pulmicort  - change IV to po steroids - Daughter states that patient is able to take prednisone po  -Patient will go home with prednisone 50 mg started 01/08/2014 MI decrease by 10 mg daily DVT Prophylaxis: lovenox  Code Status:Full code  Family Communication:Discussed with daughter  Disposition: home when med stable  Consultants:  Stroke service  Vascular surgery, Dr. Donnetta Hutching Procedures:  None Antibiotics:  Zithromax 01/03/14>>>01/07/14   Discharge Condition: Stable  Disposition: Home Follow-up Information   Follow up with EARLY, TODD, MD. Schedule an appointment as soon as possible for a visit in 2 weeks. (for hospital follow-up)    Specialty:  Vascular Surgery   Contact information:   [place] [address] [phone]       Follow up with Forbes Cellar, MD. Schedule an appointment as soon as possible for a visit in 2 months. ([facility])    Specialties:  Neurology, Radiology   Contact information:   [address] [place] [address] [phone]       Diet: Heart healthy Wt Readings from Last 3 Encounters:  01/03/14 87.8 kg (193 lb 9  oz)  01/02/14 87.544 kg (193 lb)  12/05/13 81.647 kg (180 lb)    History of present illness:  63 y.o. female, right handed, with a past medical history significant for HTN, hyperlipidemia, smoking, COPD, anxiety, depression, who initially presented to urgent care 3/15 with left hand weakness and was sent to [facility] for MRI brain which revealed acute stroke.  She expressed that this past Tuesday 3/10 she woke up with numbness and tingling in her left hand but approximately 3 days later she started having problems using the left hand due to weakness. She said that she hasn't been able to open and close her left hand and flexion-extension-spreading finger movements are also impaired in the left hand. Denies HA, vertigo, double vision, difficulty swallowing, slurred speech, language or vision impairment. No neck pain or trauma. MRI brain revealed acute nonhemorrhagic watershed territory infarcts involving the right frontal lobe. MRA brain: decreased caliber of the right internal carotid artery and decreased signal within the right A1 segment and MCA branches reflecting decreased perfusion. Patient was not administerd TPA secondary to delay in arrival. She was admitted for further evaluation and treatment. During the hospitalization, a full-strength workup was undertaken. The stroke service followed the patient. The patient was started on Plavix. Carotid stenosis was noted on the right. Vascular surgery was consult. Dr. Donnetta Hutching saw the patient. The patient will followup in his office to schedule a carotid endarterectomy. The patient was found to be dyspneic. She is found to have a COPD exacerbation with wheezing. The patient was started on intravenous steroids and subsequently transitioned to oral steroids with improvement of her dyspnea.    Consultants: [facility]-Dr. Early Neurology  Discharge Exam: Filed Vitals:   01/07/14 0603  BP: 146/62  Pulse: 68  Temp: 97.4 F (36.3 C)  Resp: 18   Filed Vitals:    01/06/14 1749 01/06/14 2125 01/07/14 0107 01/07/14 0603  BP: 122/65 114/57 114/60 146/62  Pulse: 68 74 64 68  Temp: 97.7 F (36.5 C) 97.4 F (36.3 C)  97.4 F (36.3 C)  TempSrc: Oral Oral  Oral  Resp: '18 20 18 18  ' Height:      Weight:      SpO2: 99% 98% 99% 100%   General: A&O x 3, NAD, pleasant, cooperative Cardiovascular: RRR, no rub, no gallop, no S3 Respiratory: Minimal left basilar wheeze. Right foot to auscultation. Good air movement. Abdomen:soft, nontender, nondistended, positive bowel sounds Extremities: No edema, No lymphangitis, no petechiae  Discharge Instructions      Discharge Orders   Future Orders Complete By Expires   Diet - low sodium heart healthy  As directed    Discharge instructions  As directed    Comments:     Stop taking lisinopril-HCTZ Start taking lisinopril 64m once daily Starting 01/08/14, take prednisione 519m(5 tabs) once daily and decrease by one tablet daily until gone.   Increase activity slowly  As directed        Medication List    STOP taking these medications       hydrochlorothiazide 25 MG tablet  Commonly known as:  HYDRODIURIL     lisinopril-hydrochlorothiazide 20-25 MG per tablet  Commonly known as:  PRINZIDE,ZESTORETIC     PredniSONE 10 MG Kit  Replaced by:  predniSONE 10 MG tablet      TAKE these medications       albuterol 108 (90 BASE) MCG/ACT inhaler  Commonly known as:  PROVENTIL HFA;VENTOLIN HFA  Inhale 1-2 puffs into the lungs every 6 (six) hours as needed for wheezing or shortness of breath (SOB).     ALPRAZolam 0.25 MG tablet  Commonly known as:  XANAX  Take 0.375 mg by mouth at bedtime as needed (anxiety). For sleep.     amLODipine 10 MG tablet  Commonly known as:  NORVASC  Take 10 mg by mouth daily.     atenolol 50 MG tablet  Commonly known as:  TENORMIN  Take 1 tablet (50 mg total) by mouth daily.     benzonatate 100 MG capsule  Commonly known as:  TESSALON  Take 1 capsule (100 mg total) by  mouth 3 (three) times daily as needed for cough.     budesonide 0.25 MG/2ML nebulizer solution  Commonly known as:  PULMICORT  Take 2 mLs (0.25 mg total) by nebulization 2 (two) times daily.     cloNIDine 0.2 MG tablet  Commonly known as:  CATAPRES  Take 1 tablet (0.2 mg total) by mouth 2 (two) times daily.     clopidogrel 75 MG tablet  Commonly known as:  PLAVIX  Take 1 tablet (75 mg total) by mouth daily with breakfast.     COMBIVENT 18-103 MCG/ACT inhaler  Generic drug:  albuterol-ipratropium  Inhale 1-2 puffs into the lungs 4 (four) times daily.     DULoxetine 60 MG capsule  Commonly known as:  CYMBALTA  Take 60 mg by mouth daily.     fenofibrate 160 MG tablet  One tablet by mouth once daily     fluticasone 50 MCG/ACT nasal spray  Commonly known as:  FLONASE  Place 2 sprays into the nose daily.     guaiFENesin-codeine 100-10 MG/5ML syrup  Take 5 mLs by mouth every 6 (six) hours as needed for cough.     isosorbide mononitrate 30 MG 24 hr tablet  Commonly known as:  IMDUR  Take 30 mg by mouth 2 (two) times daily.     lisinopril 20 MG tablet  Commonly known as:  PRINIVIL,ZESTRIL  Take 1 tablet (20 mg total) by mouth daily.     nicotine 21 mg/24hr patch  Commonly known as:  NICODERM CQ - dosed in mg/24 hours  Place 1 patch (21 mg total) onto the skin daily.     pantoprazole 40 MG tablet  Commonly known as:  PROTONIX  Take 40 mg by mouth 2 (two) times daily as needed. For stomach upset.     predniSONE 10 MG tablet  Commonly known as:  DELTASONE  Take 43m (5 tablets) on 01/08/14 and decrease by one tablet daily     simvastatin 10 MG tablet  Commonly known as:  ZOCOR  Take 1 tablet (10 mg total) by mouth at bedtime.         The results of significant diagnostics from this hospitalization (including imaging, microbiology, ancillary and laboratory) are listed below for reference.    Significant Diagnostic Studies: Dg Chest 2 View  01/03/2014   CLINICAL DATA:   Shortness of breath  EXAM: CHEST  2 VIEW  COMPARISON:  DG CHEST 2 VIEW dated 10/07/2011  FINDINGS: The heart size and mediastinal contours are within normal limits. Both lungs are clear. The visualized skeletal structures are unremarkable.  IMPRESSION: No active cardiopulmonary disease.   Electronically Signed  By: Margaree Mackintosh M.D.   On: 01/03/2014 07:49   Mr Jodene Nam Head Wo Contrast  01/02/2014   CLINICAL DATA:  Left-sided upper extremity weakness and facial droop for 1 week. Numbness.  EXAM: MRI HEAD WITHOUT CONTRAST  MRA HEAD WITHOUT CONTRAST  TECHNIQUE: Multiplanar, multiecho pulse sequences of the brain and surrounding structures were obtained without intravenous contrast. Angiographic images of the head were obtained using MRA technique without contrast.  COMPARISON:  None available.  FINDINGS: MRI HEAD FINDINGS  The diffusion-weighted images demonstrate scattered watershed type infarcts involving the right frontal lobe. These extending to the coronal radiata. T2 changes are associated with the infarct both along the cortex and white matter tracts.  There is no other significant white matter disease. Flow is present in the major intracranial arteries. The ventricles are of normal size. No significant extra-axial fluid collection is present.  The globes and orbits are intact. The paranasal sinuses are clear. Minimal fluid is present in the left mastoid air cells. No obstructing nasopharyngeal lesion is evident.  MRA HEAD FINDINGS  The right internal carotid artery is decreased in caliber relative to the left. No focal stenosis is evident. There is significant decreased signal in the right A1 segment compared to the left, likely reflecting decreased perfusion. A 2 mm infundibulum is present at the left posterior communicating artery. The left A1 segment is normal. The anterior communicating artery is patent. The left M1 segment and bifurcation is within normal limits. There is decreased caliber to the right  M1 segment. Signal intensity in the right MCA bifurcations is decreased relative to the left. No focal stenosis is evident.  The right vertebral artery is hypoplastic and terminates at the PICA. The left vertebral artery feeds the basilar artery. There is no focal stenosis. The left PICA origin is visualized and normal. Both posterior cerebral arteries originate from the basilar tip. The PCA branch vessels are within normal limits.  IMPRESSION: 1. Acute nonhemorrhagic watershed territory infarcts involving the right frontal lobe. The majority these are white matter although there is a focal area of cortical infarct. 2. Decreased caliber of the right internal carotid artery and decreased signal within the right A1 segment and MCA branches reflecting decreased profusion. This suggests a more proximal stenosis, likely at the right carotid artery bifurcation. These results were called by telephone at the time of interpretation on 01/02/2014 at 8:35 PM to Dr. Ellison Carwin , who verbally acknowledged these results.   Electronically Signed   By: Lawrence Santiago M.D.   On: 01/02/2014 20:35   Mr Brain Wo Contrast  01/02/2014   CLINICAL DATA:  Left-sided upper extremity weakness and facial droop for 1 week. Numbness.  EXAM: MRI HEAD WITHOUT CONTRAST  MRA HEAD WITHOUT CONTRAST  TECHNIQUE: Multiplanar, multiecho pulse sequences of the brain and surrounding structures were obtained without intravenous contrast. Angiographic images of the head were obtained using MRA technique without contrast.  COMPARISON:  None available.  FINDINGS: MRI HEAD FINDINGS  The diffusion-weighted images demonstrate scattered watershed type infarcts involving the right frontal lobe. These extending to the coronal radiata. T2 changes are associated with the infarct both along the cortex and white matter tracts.  There is no other significant white matter disease. Flow is present in the major intracranial arteries. The ventricles are of normal  size. No significant extra-axial fluid collection is present.  The globes and orbits are intact. The paranasal sinuses are clear. Minimal fluid is present in the left mastoid air cells. No  obstructing nasopharyngeal lesion is evident.  MRA HEAD FINDINGS  The right internal carotid artery is decreased in caliber relative to the left. No focal stenosis is evident. There is significant decreased signal in the right A1 segment compared to the left, likely reflecting decreased perfusion. A 2 mm infundibulum is present at the left posterior communicating artery. The left A1 segment is normal. The anterior communicating artery is patent. The left M1 segment and bifurcation is within normal limits. There is decreased caliber to the right M1 segment. Signal intensity in the right MCA bifurcations is decreased relative to the left. No focal stenosis is evident.  The right vertebral artery is hypoplastic and terminates at the PICA. The left vertebral artery feeds the basilar artery. There is no focal stenosis. The left PICA origin is visualized and normal. Both posterior cerebral arteries originate from the basilar tip. The PCA branch vessels are within normal limits.  IMPRESSION: 1. Acute nonhemorrhagic watershed territory infarcts involving the right frontal lobe. The majority these are white matter although there is a focal area of cortical infarct. 2. Decreased caliber of the right internal carotid artery and decreased signal within the right A1 segment and MCA branches reflecting decreased profusion. This suggests a more proximal stenosis, likely at the right carotid artery bifurcation. These results were called by telephone at the time of interpretation on 01/02/2014 at 8:35 PM to Dr. Ellison Carwin , who verbally acknowledged these results.   Electronically Signed   By: Lawrence Santiago M.D.   On: 01/02/2014 20:35   Dg Chest Port 1 View  01/06/2014   CLINICAL DATA:  New shortness of breath.  EXAM: PORTABLE CHEST - 1  VIEW  COMPARISON:  01/03/2014  FINDINGS: Evidence for hyperinflation and lucency in the upper lungs. Findings could represent emphysema. There is no focal airspace disease or pulmonary edema. Heart size is normal. The trachea is midline. No acute bone abnormality.  IMPRESSION: No acute cardiopulmonary disease.   Electronically Signed   By: Markus Daft M.D.   On: 01/06/2014 21:40     Microbiology: No results found for this or any previous visit (from the past 240 hour(s)).   Labs: Basic Metabolic Panel:  Recent Labs Lab 01/04/14 0530 01/05/14 0930 01/06/14 0650 01/06/14 1538 01/07/14 0243  NA 126* 123* 125* 125* 130*  K 4.1 4.5 4.7 4.7 4.4  CL 89* 85* 89* 90* 93*  CO2 '20 22 20 21 24  ' GLUCOSE 160* 168* 139* 134* 121*  BUN '15 19 20 20 19  ' CREATININE 0.85 0.82 0.81 0.79 0.80  CALCIUM 9.9 9.8 9.9 9.6 9.5   Liver Function Tests:  Recent Labs Lab 01/02/14 1808 01/02/14 2143  AST 18 21  ALT 16 16  ALKPHOS 49 51  BILITOT 0.3 0.3  PROT 7.8 8.0  ALBUMIN 4.8 4.5   No results found for this basename: LIPASE, AMYLASE,  in the last 168 hours No results found for this basename: AMMONIA,  in the last 168 hours CBC:  Recent Labs Lab 01/02/14 1825 01/02/14 2143 01/02/14 2153 01/03/14 0840 01/07/14 0243  WBC 7.0 8.6  --  6.7 10.3  NEUTROABS  --  6.2  --   --   --   HGB 12.6 12.3 13.6 11.3* 10.5*  HCT 38.9 35.1* 40.0 32.2* 29.4*  MCV 96.0 90.9  --  89.9 89.6  PLT  --  305  --  279 308   Cardiac Enzymes: No results found for this basename: CKTOTAL, CKMB, CKMBINDEX, TROPONINI,  in the last 168 hours BNP: No components found with this basename: POCBNP,  CBG: No results found for this basename: GLUCAP,  in the last 168 hours  Time coordinating discharge:  Greater than 30 minutes  Signed:  Idrees Quam, DO [facility] Pager: [phone] 01/07/2014, 9:43 AM

## 2014-01-07 NOTE — Progress Notes (Signed)
D/C orders received. Pt and family educated on D/C instructions and stroke education. Verbalized understanding. Pt taken downstairs by staff via wheelchair.

## 2014-01-14 ENCOUNTER — Encounter (HOSPITAL_COMMUNITY)
Admission: RE | Admit: 2014-01-14 | Discharge: 2014-01-14 | Disposition: A | Discharge status: Home / Self Care | Payer: No Typology Code available for payment source | Source: Ambulatory Visit | Attending: Vascular Surgery | Admitting: Vascular Surgery

## 2014-01-14 ENCOUNTER — Encounter (HOSPITAL_COMMUNITY): Payer: Self-pay

## 2014-01-14 HISTORY — DX: Cardiac murmur, unspecified: R01.1

## 2014-01-14 HISTORY — DX: Shortness of breath: R06.02

## 2014-01-14 HISTORY — DX: Anemia, unspecified: D64.9

## 2014-01-14 HISTORY — DX: Cerebral infarction, unspecified: I63.9

## 2014-01-14 LAB — CBC
HEMATOCRIT: 36 % (ref 36.0–46.0)
Hemoglobin: 12.5 g/dL (ref 12.0–15.0)
MCH: 31.6 pg (ref 26.0–34.0)
MCHC: 34.7 g/dL (ref 30.0–36.0)
MCV: 90.9 fL (ref 78.0–100.0)
Platelets: 381 10*3/uL (ref 150–400)
RBC: 3.96 MIL/uL (ref 3.87–5.11)
RDW: 12.4 % (ref 11.5–15.5)
WBC: 13.8 10*3/uL — ABNORMAL HIGH (ref 4.0–10.5)

## 2014-01-14 LAB — COMPREHENSIVE METABOLIC PANEL
ALK PHOS: 54 U/L (ref 39–117)
ALT: 14 U/L (ref 0–35)
AST: 16 U/L (ref 0–37)
Albumin: 3.8 g/dL (ref 3.5–5.2)
BILIRUBIN TOTAL: 0.3 mg/dL (ref 0.3–1.2)
BUN: 22 mg/dL (ref 6–23)
CO2: 21 meq/L (ref 19–32)
CREATININE: 1.03 mg/dL (ref 0.50–1.10)
Calcium: 9.7 mg/dL (ref 8.4–10.5)
Chloride: 92 mEq/L — ABNORMAL LOW (ref 96–112)
GFR, EST AFRICAN AMERICAN: 65 mL/min — AB (ref >90)
GFR, EST NON AFRICAN AMERICAN: 56 mL/min — AB (ref >90)
Glucose, Bld: 108 mg/dL — ABNORMAL HIGH (ref 70–99)
POTASSIUM: 5.2 meq/L (ref 3.7–5.3)
Sodium: 129 mEq/L — ABNORMAL LOW (ref 137–147)
Total Protein: 7.1 g/dL (ref 6.0–8.3)

## 2014-01-14 LAB — URINALYSIS, ROUTINE W REFLEX MICROSCOPIC
Bilirubin Urine: NEGATIVE
GLUCOSE, UA: NEGATIVE mg/dL
HGB URINE DIPSTICK: NEGATIVE
Ketones, ur: NEGATIVE mg/dL
Leukocytes, UA: NEGATIVE
NITRITE: NEGATIVE
PH: 5.5 (ref 5.0–8.0)
Protein, ur: NEGATIVE mg/dL
SPECIFIC GRAVITY, URINE: 1.021 (ref 1.005–1.030)
Urobilinogen, UA: 0.2 mg/dL (ref 0.0–1.0)

## 2014-01-14 LAB — APTT: aPTT: 26 seconds (ref 24–37)

## 2014-01-14 LAB — TYPE AND SCREEN
ABO/RH(D): A POS
Antibody Screen: NEGATIVE

## 2014-01-14 LAB — SURGICAL PCR SCREEN
MRSA, PCR: NEGATIVE
STAPHYLOCOCCUS AUREUS: NEGATIVE

## 2014-01-14 LAB — PROTIME-INR
INR: 0.93 (ref 0.00–1.49)
Prothrombin Time: 12.3 seconds (ref 11.6–15.2)

## 2014-01-14 LAB — ABO/RH: ABO/RH(D): A POS

## 2014-01-14 NOTE — Progress Notes (Signed)
Call to [facility] at Dr. Donnetta Hutching, confirmed that pt. Is to stay on Plavix  Up til day of surgery , including DOS.

## 2014-01-14 NOTE — Progress Notes (Signed)
Pt. Hospitalized 12/2013 for stroke; ekg & cxr done at that time, ECHO unreadable; will be repeated at a later date.  Pt. reports that her PCP saw her 01/11/2014.  Pt. Denies seeing cardiologist, states she had a stress test 20 yrs. Ago.

## 2014-01-14 NOTE — Pre-Procedure Instructions (Signed)
Shannon Roberts  01/14/2014   Your procedure is scheduled on:  01/17/2014  Report to [facility]  2 * 3   Entrance A- proceed to ADMITTING office  at 5:30 AM.  Call this number if you have problems the morning of surgery: [phone]   Remember:   Do not eat food or drink liquids after midnight.  On SUNDAY   Take these medicines the morning of surgery with A SIP OF WATER: use inhaler as prescribed , xanax if needed, amlodipine, atenolol, plavix , cymbalta, isosorbide , protonix   Do not wear jewelry, make-up or nail polish.  Do not wear lotions, powders, or perfumes. You may wear deodorant.  Do not shave 48 hours prior to surgery.    Do not bring valuables to the hospital.  [facility] is not responsible  for any belongings or valuables.               Contacts, dentures or bridgework may not be worn into surgery.   Leave suitcase in the car. After surgery it may be brought to your room.   For patients admitted to the hospital, discharge time is determined by your                treatment team.               Patients discharged the day of surgery will not be allowed to drive  home.  Name and phone number of your driver: /with family  Special Instructions: Special Instructions:  - Preparing for Surgery  Before surgery, you can play an important role.  Because skin is not sterile, your skin needs to be as free of germs as possible.  You can reduce the number of germs on you skin by washing with CHG (chlorahexidine gluconate) soap before surgery.  CHG is an antiseptic cleaner which kills germs and bonds with the skin to continue killing germs even after washing.  Please DO NOT use if you have an allergy to CHG or antibacterial soaps.  If your skin becomes reddened/irritated stop using the CHG and inform your nurse when you arrive at Short Stay.  Do not shave (including legs and underarms) for at least 48 hours prior to the first CHG shower.  You may  shave your face.  Please follow these instructions carefully:   1.  Shower with CHG Soap the night before surgery and the  morning of Surgery.  2.  If you choose to wash your hair, wash your hair first as usual with your  normal shampoo.  3.  After you shampoo, rinse your hair and body thoroughly to remove the  Shampoo.  4.  Use CHG as you would any other liquid soap.  You can apply chg directly to the skin and wash gently with scrungie or a clean washcloth.  5.  Apply the CHG Soap to your body ONLY FROM THE NECK DOWN.    Do not use on open wounds or open sores.  Avoid contact with your eyes, ears, mouth and genitals (private parts).  Wash genitals (private parts)   with your normal soap.  6.  Wash thoroughly, paying special attention to the area where your surgery will be performed.  7.  Thoroughly rinse your body with warm water from the neck down.  8.  DO NOT shower/wash with your normal soap after using and rinsing off   the CHG Soap.  9.  Pat yourself dry with  a clean towel.            10.  Wear clean pajamas.            11.  Place clean sheets on your bed the night of your first shower and do not sleep with pets.  Day of Surgery  Do not apply any lotions/deodorants the morning of surgery.  Please wear clean clothes to the hospital/surgery center.   Please read over the following fact sheets that you were given: Pain Booklet, Coughing and Deep Breathing, Blood Transfusion Information, MRSA Information and Surgical Site Infection Prevention

## 2014-01-15 NOTE — Progress Notes (Signed)
Notified Dr. Al Corpus of CMET. He requested I-Stat DOS and then notify assigned anestheologist if needed

## 2014-01-16 MED ORDER — CHLORHEXIDINE GLUCONATE 4 % EX LIQD
60.0000 mL | Freq: Once | CUTANEOUS | Status: DC
  Filled 2014-01-16: qty 60

## 2014-01-16 MED ORDER — VANCOMYCIN HCL IN DEXTROSE 1-5 GM/200ML-% IV SOLN
1000.0000 mg | INTRAVENOUS | Status: AC
  Administered 2014-01-17: 1000 mg via INTRAVENOUS
  Filled 2014-01-16: qty 200

## 2014-01-17 ENCOUNTER — Encounter (HOSPITAL_COMMUNITY): Payer: Self-pay | Admitting: *Deleted

## 2014-01-17 ENCOUNTER — Encounter (HOSPITAL_COMMUNITY)
Admission: RE | Disposition: A | Discharge status: Home / Self Care | Payer: Self-pay | Source: Ambulatory Visit | Attending: Vascular Surgery

## 2014-01-17 ENCOUNTER — Inpatient Hospital Stay (HOSPITAL_COMMUNITY): Payer: No Typology Code available for payment source | Admitting: Certified Registered Nurse Anesthetist

## 2014-01-17 ENCOUNTER — Encounter (HOSPITAL_COMMUNITY): Payer: No Typology Code available for payment source | Admitting: Certified Registered Nurse Anesthetist

## 2014-01-17 ENCOUNTER — Inpatient Hospital Stay (HOSPITAL_COMMUNITY)
Admission: RE | Admit: 2014-01-17 | Discharge: 2014-01-18 | DRG: 038 | Disposition: A | Discharge status: Home / Self Care | Payer: No Typology Code available for payment source | Source: Ambulatory Visit | Attending: Vascular Surgery | Admitting: Vascular Surgery

## 2014-01-17 DIAGNOSIS — R29898 Other symptoms and signs involving the musculoskeletal system: Secondary | ICD-10-CM | POA: Diagnosis present

## 2014-01-17 DIAGNOSIS — Z8673 Personal history of transient ischemic attack (TIA), and cerebral infarction without residual deficits: Secondary | ICD-10-CM

## 2014-01-17 DIAGNOSIS — K219 Gastro-esophageal reflux disease without esophagitis: Secondary | ICD-10-CM | POA: Diagnosis present

## 2014-01-17 DIAGNOSIS — I10 Essential (primary) hypertension: Secondary | ICD-10-CM | POA: Diagnosis present

## 2014-01-17 DIAGNOSIS — I6529 Occlusion and stenosis of unspecified carotid artery: Secondary | ICD-10-CM | POA: Diagnosis present

## 2014-01-17 DIAGNOSIS — I63239 Cerebral infarction due to unspecified occlusion or stenosis of unspecified carotid arteries: Principal | ICD-10-CM | POA: Diagnosis present

## 2014-01-17 DIAGNOSIS — Z79899 Other long term (current) drug therapy: Secondary | ICD-10-CM

## 2014-01-17 DIAGNOSIS — J4489 Other specified chronic obstructive pulmonary disease: Secondary | ICD-10-CM | POA: Diagnosis present

## 2014-01-17 DIAGNOSIS — E871 Hypo-osmolality and hyponatremia: Secondary | ICD-10-CM | POA: Diagnosis present

## 2014-01-17 DIAGNOSIS — J449 Chronic obstructive pulmonary disease, unspecified: Secondary | ICD-10-CM | POA: Diagnosis present

## 2014-01-17 DIAGNOSIS — E785 Hyperlipidemia, unspecified: Secondary | ICD-10-CM | POA: Diagnosis present

## 2014-01-17 HISTORY — PX: ENDARTERECTOMY: SHX5162

## 2014-01-17 HISTORY — PX: CAROTID ENDARTERECTOMY: SUR193

## 2014-01-17 LAB — CREATININE, SERUM
Creatinine, Ser: 0.77 mg/dL (ref 0.50–1.10)
GFR calc Af Amer: >90 mL/min (ref >90)
GFR, EST NON AFRICAN AMERICAN: 87 mL/min — AB (ref >90)

## 2014-01-17 LAB — CBC
HEMATOCRIT: 25 % — AB (ref 36.0–46.0)
Hemoglobin: 8.6 g/dL — ABNORMAL LOW (ref 12.0–15.0)
MCH: 31.9 pg (ref 26.0–34.0)
MCHC: 34.4 g/dL (ref 30.0–36.0)
MCV: 92.6 fL (ref 78.0–100.0)
Platelets: 204 10*3/uL (ref 150–400)
RBC: 2.7 MIL/uL — ABNORMAL LOW (ref 3.87–5.11)
RDW: 12.7 % (ref 11.5–15.5)
WBC: 9.8 10*3/uL (ref 4.0–10.5)

## 2014-01-17 LAB — POCT I-STAT 4, (NA,K, GLUC, HGB,HCT)
Glucose, Bld: 129 mg/dL — ABNORMAL HIGH (ref 70–99)
HCT: 34 % — ABNORMAL LOW (ref 36.0–46.0)
Hemoglobin: 11.6 g/dL — ABNORMAL LOW (ref 12.0–15.0)
Potassium: 5.1 mEq/L (ref 3.7–5.3)
SODIUM: 133 meq/L — AB (ref 137–147)

## 2014-01-17 SURGERY — ENDARTERECTOMY, CAROTID
Anesthesia: General | Site: Neck | Laterality: Right

## 2014-01-17 MED ORDER — GLYCOPYRROLATE 0.2 MG/ML IJ SOLN
INTRAMUSCULAR | Status: DC | PRN
  Administered 2014-01-17: 0.4 mg via INTRAVENOUS

## 2014-01-17 MED ORDER — SODIUM CHLORIDE 0.9 % IV SOLN: INTRAVENOUS | Status: DC

## 2014-01-17 MED ORDER — STERILE WATER FOR INJECTION IJ SOLN
INTRAMUSCULAR | Status: AC
  Filled 2014-01-17: qty 10

## 2014-01-17 MED ORDER — POLYETHYLENE GLYCOL 3350 17 G PO PACK
17.0000 g | PACK | Freq: Every day | ORAL | Status: DC | PRN
  Filled 2014-01-17: qty 1

## 2014-01-17 MED ORDER — ARTIFICIAL TEARS OP OINT
TOPICAL_OINTMENT | OPHTHALMIC | Status: AC
Start: 2014-01-17 — End: 2014-01-17
  Filled 2014-01-17: qty 3.5

## 2014-01-17 MED ORDER — PHENOL 1.4 % MT LIQD: 1.0000 | OROMUCOSAL | Status: DC | PRN

## 2014-01-17 MED ORDER — ACETAMINOPHEN 325 MG PO TABS: 325.0000 mg | ORAL_TABLET | ORAL | Status: DC | PRN

## 2014-01-17 MED ORDER — PANTOPRAZOLE SODIUM 40 MG PO TBEC
40.0000 mg | DELAYED_RELEASE_TABLET | Freq: Two times a day (BID) | ORAL | Status: DC
  Administered 2014-01-17 – 2014-01-18 (×2): 40 mg via ORAL
  Filled 2014-01-17 (×2): qty 1

## 2014-01-17 MED ORDER — MAGNESIUM SULFATE 40 MG/ML IJ SOLN: 2.0000 g | Freq: Every day | INTRAMUSCULAR | Status: DC | PRN

## 2014-01-17 MED ORDER — PROPOFOL 10 MG/ML IV BOLUS
INTRAVENOUS | Status: DC | PRN
  Administered 2014-01-17: 180 mg via INTRAVENOUS

## 2014-01-17 MED ORDER — DULOXETINE HCL 60 MG PO CPEP
60.0000 mg | ORAL_CAPSULE | Freq: Every day | ORAL | Status: DC
  Administered 2014-01-18: 60 mg via ORAL
  Filled 2014-01-17: qty 1

## 2014-01-17 MED ORDER — VANCOMYCIN HCL IN DEXTROSE 1-5 GM/200ML-% IV SOLN
1000.0000 mg | Freq: Two times a day (BID) | INTRAVENOUS | Status: AC
  Administered 2014-01-17 – 2014-01-18 (×2): 1000 mg via INTRAVENOUS
  Filled 2014-01-17 (×3): qty 200

## 2014-01-17 MED ORDER — FENTANYL CITRATE 0.05 MG/ML IJ SOLN
INTRAMUSCULAR | Status: DC | PRN
  Administered 2014-01-17: 100 ug via INTRAVENOUS
  Administered 2014-01-17: 50 ug via INTRAVENOUS

## 2014-01-17 MED ORDER — ISOSORBIDE MONONITRATE ER 30 MG PO TB24
30.0000 mg | ORAL_TABLET | Freq: Two times a day (BID) | ORAL | Status: DC
  Administered 2014-01-17: 30 mg via ORAL
  Filled 2014-01-17 (×3): qty 1

## 2014-01-17 MED ORDER — BUDESONIDE 0.25 MG/2ML IN SUSP
0.2500 mg | Freq: Two times a day (BID) | RESPIRATORY_TRACT | Status: DC
  Administered 2014-01-17: 0.25 mg via RESPIRATORY_TRACT
  Filled 2014-01-17 (×3): qty 2

## 2014-01-17 MED ORDER — ONDANSETRON HCL 4 MG/2ML IJ SOLN
4.0000 mg | Freq: Four times a day (QID) | INTRAMUSCULAR | Status: DC | PRN
  Filled 2014-01-17: qty 2

## 2014-01-17 MED ORDER — ENOXAPARIN SODIUM 40 MG/0.4ML ~~LOC~~ SOLN
40.0000 mg | SUBCUTANEOUS | Status: DC
  Administered 2014-01-18: 40 mg via SUBCUTANEOUS
  Filled 2014-01-17 (×2): qty 0.4

## 2014-01-17 MED ORDER — SODIUM CHLORIDE 0.9 % IV SOLN
10.0000 mg | INTRAVENOUS | Status: DC | PRN
  Administered 2014-01-17: 10 ug/min via INTRAVENOUS

## 2014-01-17 MED ORDER — ARTIFICIAL TEARS OP OINT
TOPICAL_OINTMENT | OPHTHALMIC | Status: DC | PRN
  Administered 2014-01-17: 1 via OPHTHALMIC

## 2014-01-17 MED ORDER — HEPARIN SODIUM (PORCINE) 1000 UNIT/ML IJ SOLN
INTRAMUSCULAR | Status: AC
  Filled 2014-01-17: qty 1

## 2014-01-17 MED ORDER — LIDOCAINE HCL (CARDIAC) 20 MG/ML IV SOLN
INTRAVENOUS | Status: AC
  Filled 2014-01-17: qty 5

## 2014-01-17 MED ORDER — LACTATED RINGERS IV SOLN
INTRAVENOUS | Status: DC | PRN
  Administered 2014-01-17 (×2): via INTRAVENOUS

## 2014-01-17 MED ORDER — HEPARIN SODIUM (PORCINE) 1000 UNIT/ML IJ SOLN
INTRAMUSCULAR | Status: DC | PRN
  Administered 2014-01-17: 8000 [IU] via INTRAVENOUS

## 2014-01-17 MED ORDER — ROCURONIUM BROMIDE 100 MG/10ML IV SOLN
INTRAVENOUS | Status: DC | PRN
  Administered 2014-01-17: 50 mg via INTRAVENOUS

## 2014-01-17 MED ORDER — IPRATROPIUM-ALBUTEROL 0.5-2.5 (3) MG/3ML IN SOLN
3.0000 mL | Freq: Four times a day (QID) | RESPIRATORY_TRACT | Status: DC
  Administered 2014-01-18: 3 mL via RESPIRATORY_TRACT
  Filled 2014-01-17: qty 3

## 2014-01-17 MED ORDER — MORPHINE SULFATE 2 MG/ML IJ SOLN: 2.0000 mg | INTRAMUSCULAR | Status: DC | PRN

## 2014-01-17 MED ORDER — DOPAMINE-DEXTROSE 3.2-5 MG/ML-% IV SOLN
INTRAVENOUS | Status: AC
  Filled 2014-01-17: qty 250

## 2014-01-17 MED ORDER — AMLODIPINE BESYLATE 10 MG PO TABS
10.0000 mg | ORAL_TABLET | Freq: Every day | ORAL | Status: DC
  Administered 2014-01-18: 10 mg via ORAL
  Filled 2014-01-17: qty 1

## 2014-01-17 MED ORDER — GLYCOPYRROLATE 0.2 MG/ML IJ SOLN
INTRAMUSCULAR | Status: AC
  Filled 2014-01-17: qty 2

## 2014-01-17 MED ORDER — ATENOLOL 50 MG PO TABS
50.0000 mg | ORAL_TABLET | Freq: Every day | ORAL | Status: DC
  Administered 2014-01-18: 50 mg via ORAL
  Filled 2014-01-17: qty 1

## 2014-01-17 MED ORDER — LIDOCAINE HCL (PF) 1 % IJ SOLN
INTRAMUSCULAR | Status: AC
  Filled 2014-01-17: qty 30

## 2014-01-17 MED ORDER — LIDOCAINE HCL (CARDIAC) 20 MG/ML IV SOLN
INTRAVENOUS | Status: DC | PRN
  Administered 2014-01-17: 100 mg via INTRAVENOUS

## 2014-01-17 MED ORDER — IPRATROPIUM-ALBUTEROL 18-103 MCG/ACT IN AERO: 1.0000 | INHALATION_SPRAY | RESPIRATORY_TRACT | Status: DC

## 2014-01-17 MED ORDER — EPHEDRINE SULFATE 50 MG/ML IJ SOLN
INTRAMUSCULAR | Status: AC
  Filled 2014-01-17: qty 1

## 2014-01-17 MED ORDER — SODIUM CHLORIDE 0.9 % IV SOLN
INTRAVENOUS | Status: DC
  Administered 2014-01-17: 14:00:00 via INTRAVENOUS

## 2014-01-17 MED ORDER — CLOPIDOGREL BISULFATE 75 MG PO TABS
75.0000 mg | ORAL_TABLET | Freq: Every day | ORAL | Status: DC
  Administered 2014-01-18: 75 mg via ORAL
  Filled 2014-01-17 (×2): qty 1

## 2014-01-17 MED ORDER — OXYCODONE HCL 5 MG PO TABS
5.0000 mg | ORAL_TABLET | Freq: Once | ORAL | Status: DC | PRN
Start: 2014-01-17 — End: 2014-01-17

## 2014-01-17 MED ORDER — HYDROMORPHONE HCL PF 1 MG/ML IJ SOLN: 0.2500 mg | INTRAMUSCULAR | Status: DC | PRN

## 2014-01-17 MED ORDER — ASPIRIN EC 325 MG PO TBEC
325.0000 mg | DELAYED_RELEASE_TABLET | Freq: Every day | ORAL | Status: DC
  Administered 2014-01-18: 325 mg via ORAL
  Filled 2014-01-17: qty 1

## 2014-01-17 MED ORDER — PHENYLEPHRINE 40 MCG/ML (10ML) SYRINGE FOR IV PUSH (FOR BLOOD PRESSURE SUPPORT)
PREFILLED_SYRINGE | INTRAVENOUS | Status: AC
  Filled 2014-01-17: qty 10

## 2014-01-17 MED ORDER — ACETAMINOPHEN 650 MG RE SUPP: 325.0000 mg | RECTAL | Status: DC | PRN

## 2014-01-17 MED ORDER — DOPAMINE-DEXTROSE 3.2-5 MG/ML-% IV SOLN: 3.0000 ug/kg/min | INTRAVENOUS | Status: DC

## 2014-01-17 MED ORDER — 0.9 % SODIUM CHLORIDE (POUR BTL) OPTIME
TOPICAL | Status: DC | PRN
  Administered 2014-01-17: 2000 mL

## 2014-01-17 MED ORDER — OXYCODONE-ACETAMINOPHEN 5-325 MG PO TABS: 1.0000 | ORAL_TABLET | ORAL | Status: DC | PRN

## 2014-01-17 MED ORDER — ONDANSETRON HCL 4 MG/2ML IJ SOLN: 4.0000 mg | Freq: Once | INTRAMUSCULAR | Status: DC | PRN

## 2014-01-17 MED ORDER — PROPOFOL 10 MG/ML IV BOLUS
INTRAVENOUS | Status: AC
  Filled 2014-01-17: qty 20

## 2014-01-17 MED ORDER — DOCUSATE SODIUM 100 MG PO CAPS
100.0000 mg | ORAL_CAPSULE | Freq: Every day | ORAL | Status: DC
  Administered 2014-01-18: 100 mg via ORAL
  Filled 2014-01-17: qty 1

## 2014-01-17 MED ORDER — ALBUTEROL SULFATE HFA 108 (90 BASE) MCG/ACT IN AERS: 1.0000 | INHALATION_SPRAY | Freq: Four times a day (QID) | RESPIRATORY_TRACT | Status: DC | PRN

## 2014-01-17 MED ORDER — BISACODYL 10 MG RE SUPP: 10.0000 mg | Freq: Every day | RECTAL | Status: DC | PRN

## 2014-01-17 MED ORDER — PROTAMINE SULFATE 10 MG/ML IV SOLN
INTRAVENOUS | Status: AC
  Filled 2014-01-17: qty 5

## 2014-01-17 MED ORDER — NICOTINE 21 MG/24HR TD PT24
21.0000 mg | MEDICATED_PATCH | TRANSDERMAL | Status: DC
  Administered 2014-01-18: 21 mg via TRANSDERMAL
  Filled 2014-01-17: qty 1

## 2014-01-17 MED ORDER — NEOSTIGMINE METHYLSULFATE 1 MG/ML IJ SOLN
INTRAMUSCULAR | Status: AC
  Filled 2014-01-17: qty 10

## 2014-01-17 MED ORDER — FLUTICASONE PROPIONATE 50 MCG/ACT NA SUSP
2.0000 | Freq: Every day | NASAL | Status: DC
  Filled 2014-01-17: qty 16

## 2014-01-17 MED ORDER — LISINOPRIL 20 MG PO TABS
20.0000 mg | ORAL_TABLET | Freq: Every day | ORAL | Status: DC
  Administered 2014-01-18: 20 mg via ORAL
  Filled 2014-01-17: qty 1

## 2014-01-17 MED ORDER — DEXAMETHASONE SODIUM PHOSPHATE 4 MG/ML IJ SOLN
INTRAMUSCULAR | Status: AC
  Filled 2014-01-17: qty 2

## 2014-01-17 MED ORDER — LABETALOL HCL 5 MG/ML IV SOLN: 10.0000 mg | INTRAVENOUS | Status: DC | PRN

## 2014-01-17 MED ORDER — BENZONATATE 100 MG PO CAPS
100.0000 mg | ORAL_CAPSULE | Freq: Three times a day (TID) | ORAL | Status: DC | PRN
  Filled 2014-01-17: qty 1

## 2014-01-17 MED ORDER — ALUM & MAG HYDROXIDE-SIMETH 200-200-20 MG/5ML PO SUSP: 15.0000 mL | ORAL | Status: DC | PRN

## 2014-01-17 MED ORDER — METOPROLOL TARTRATE 1 MG/ML IV SOLN: 2.0000 mg | INTRAVENOUS | Status: DC | PRN

## 2014-01-17 MED ORDER — HEPARIN SODIUM (PORCINE) 5000 UNIT/ML IJ SOLN
INTRAMUSCULAR | Status: DC | PRN
  Administered 2014-01-17: 08:00:00

## 2014-01-17 MED ORDER — ALPRAZOLAM 0.25 MG PO TABS
0.2500 mg | ORAL_TABLET | Freq: Two times a day (BID) | ORAL | Status: DC | PRN
  Administered 2014-01-17: 0.25 mg via ORAL
  Filled 2014-01-17: qty 1

## 2014-01-17 MED ORDER — OXYCODONE HCL 5 MG/5ML PO SOLN: 5.0000 mg | Freq: Once | ORAL | Status: DC | PRN

## 2014-01-17 MED ORDER — HYDRALAZINE HCL 20 MG/ML IJ SOLN
10.0000 mg | INTRAMUSCULAR | Status: DC | PRN
Start: 2014-01-17 — End: 2014-01-18

## 2014-01-17 MED ORDER — PROTAMINE SULFATE 10 MG/ML IV SOLN
INTRAVENOUS | Status: DC | PRN
  Administered 2014-01-17: 10 mg via INTRAVENOUS
  Administered 2014-01-17: 40 mg via INTRAVENOUS

## 2014-01-17 MED ORDER — FENTANYL CITRATE 0.05 MG/ML IJ SOLN
INTRAMUSCULAR | Status: AC
  Filled 2014-01-17: qty 5

## 2014-01-17 MED ORDER — NEOSTIGMINE METHYLSULFATE 1 MG/ML IJ SOLN
INTRAMUSCULAR | Status: DC | PRN
  Administered 2014-01-17: 3 mg via INTRAVENOUS

## 2014-01-17 MED ORDER — DEXAMETHASONE SODIUM PHOSPHATE 4 MG/ML IJ SOLN
INTRAMUSCULAR | Status: DC | PRN
  Administered 2014-01-17: 8 mg via INTRAVENOUS

## 2014-01-17 MED ORDER — GUAIFENESIN-CODEINE 100-10 MG/5ML PO SOLN: 5.0000 mL | Freq: Four times a day (QID) | ORAL | Status: DC | PRN

## 2014-01-17 MED ORDER — POTASSIUM CHLORIDE CRYS ER 20 MEQ PO TBCR: 20.0000 meq | EXTENDED_RELEASE_TABLET | Freq: Every day | ORAL | Status: DC | PRN

## 2014-01-17 MED ORDER — IPRATROPIUM-ALBUTEROL 0.5-2.5 (3) MG/3ML IN SOLN
3.0000 mL | RESPIRATORY_TRACT | Status: DC
  Administered 2014-01-17 (×2): 3 mL via RESPIRATORY_TRACT
  Filled 2014-01-17 (×2): qty 3

## 2014-01-17 MED ORDER — ONDANSETRON HCL 4 MG/2ML IJ SOLN
INTRAMUSCULAR | Status: DC | PRN
  Administered 2014-01-17: 4 mg via INTRAVENOUS

## 2014-01-17 MED ORDER — ONDANSETRON HCL 4 MG/2ML IJ SOLN
INTRAMUSCULAR | Status: AC
  Filled 2014-01-17: qty 2

## 2014-01-17 MED ORDER — MEPERIDINE HCL 25 MG/ML IJ SOLN: 6.2500 mg | INTRAMUSCULAR | Status: DC | PRN

## 2014-01-17 MED ORDER — SODIUM CHLORIDE 0.9 % IV SOLN
500.0000 mL | Freq: Once | INTRAVENOUS | Status: AC | PRN
  Administered 2014-01-17: 11:00:00 via INTRAVENOUS

## 2014-01-17 MED ORDER — SIMVASTATIN 10 MG PO TABS
10.0000 mg | ORAL_TABLET | Freq: Every day | ORAL | Status: DC
  Administered 2014-01-17: 10 mg via ORAL
  Filled 2014-01-17 (×2): qty 1

## 2014-01-17 SURGICAL SUPPLY — 54 items
APL SKNCLS STERI-STRIP NONHPOA (GAUZE/BANDAGES/DRESSINGS) ×1
BENZOIN TINCTURE PRP APPL 2/3 (GAUZE/BANDAGES/DRESSINGS) ×2 IMPLANT
CANISTER SUCTION 2500CC (MISCELLANEOUS) ×2 IMPLANT
CATH ROBINSON RED A/P 18FR (CATHETERS) ×2 IMPLANT
CLIP LIGATING EXTRA MED SLVR (CLIP) ×2 IMPLANT
CLIP LIGATING EXTRA SM BLUE (MISCELLANEOUS) ×2 IMPLANT
CLSR STERI-STRIP ANTIMIC 1/2X4 (GAUZE/BANDAGES/DRESSINGS) ×1 IMPLANT
COVER SURGICAL LIGHT HANDLE (MISCELLANEOUS) ×2 IMPLANT
CRADLE DONUT ADULT HEAD (MISCELLANEOUS) ×2 IMPLANT
DECANTER SPIKE VIAL GLASS SM (MISCELLANEOUS) IMPLANT
DRAIN HEMOVAC 1/8 X 5 (WOUND CARE) IMPLANT
DRAPE WARM FLUID 44X44 (DRAPE) ×2 IMPLANT
DRSG COVADERM 4X8 (GAUZE/BANDAGES/DRESSINGS) ×1 IMPLANT
ELECT REM PT RETURN 9FT ADLT (ELECTROSURGICAL) ×2
ELECTRODE REM PT RTRN 9FT ADLT (ELECTROSURGICAL) ×1 IMPLANT
EVACUATOR SILICONE 100CC (DRAIN) IMPLANT
GEL ULTRASOUND 20GR AQUASONIC (MISCELLANEOUS) IMPLANT
GLOVE BIOGEL M 6.5 STRL (GLOVE) ×1 IMPLANT
GLOVE BIOGEL PI IND STRL 6.5 (GLOVE) IMPLANT
GLOVE BIOGEL PI IND STRL 7.0 (GLOVE) IMPLANT
GLOVE BIOGEL PI IND STRL 7.5 (GLOVE) IMPLANT
GLOVE BIOGEL PI INDICATOR 6.5 (GLOVE) ×1
GLOVE BIOGEL PI INDICATOR 7.0 (GLOVE) ×1
GLOVE BIOGEL PI INDICATOR 7.5 (GLOVE) ×1
GLOVE SS BIOGEL STRL SZ 7 (GLOVE) IMPLANT
GLOVE SS BIOGEL STRL SZ 7.5 (GLOVE) ×1 IMPLANT
GLOVE SUPERSENSE BIOGEL SZ 7 (GLOVE) ×1
GLOVE SUPERSENSE BIOGEL SZ 7.5 (GLOVE) ×1
GLOVE SURG SS PI 7.0 STRL IVOR (GLOVE) ×1 IMPLANT
GOWN STRL REUS W/ TWL LRG LVL3 (GOWN DISPOSABLE) ×3 IMPLANT
GOWN STRL REUS W/ TWL XL LVL3 (GOWN DISPOSABLE) IMPLANT
GOWN STRL REUS W/TWL LRG LVL3 (GOWN DISPOSABLE) ×6
GOWN STRL REUS W/TWL XL LVL3 (GOWN DISPOSABLE) ×2
KIT BASIN OR (CUSTOM PROCEDURE TRAY) ×2 IMPLANT
KIT ROOM TURNOVER OR (KITS) ×2 IMPLANT
NEEDLE 22X1 1/2 (OR ONLY) (NEEDLE) IMPLANT
NS IRRIG 1000ML POUR BTL (IV SOLUTION) ×4 IMPLANT
PACK CAROTID (CUSTOM PROCEDURE TRAY) ×2 IMPLANT
PAD ARMBOARD 7.5X6 YLW CONV (MISCELLANEOUS) ×4 IMPLANT
PATCH HEMASHIELD 8X75 (Vascular Products) ×1 IMPLANT
SHUNT CAROTID BYPASS 10 (VASCULAR PRODUCTS) ×1 IMPLANT
SHUNT CAROTID BYPASS 12FRX15.5 (VASCULAR PRODUCTS) IMPLANT
STRIP CLOSURE SKIN 1/2X4 (GAUZE/BANDAGES/DRESSINGS) ×2 IMPLANT
SUT ETHILON 3 0 PS 1 (SUTURE) IMPLANT
SUT PROLENE 6 0 CC (SUTURE) ×3 IMPLANT
SUT SILK 3 0 (SUTURE)
SUT SILK 3-0 18XBRD TIE 12 (SUTURE) IMPLANT
SUT VIC AB 3-0 SH 27 (SUTURE) ×4
SUT VIC AB 3-0 SH 27X BRD (SUTURE) ×2 IMPLANT
SUT VICRYL 4-0 PS2 18IN ABS (SUTURE) ×2 IMPLANT
SYR CONTROL 10ML LL (SYRINGE) IMPLANT
TOWEL OR 17X24 6PK STRL BLUE (TOWEL DISPOSABLE) ×2 IMPLANT
TOWEL OR 17X26 10 PK STRL BLUE (TOWEL DISPOSABLE) ×2 IMPLANT
WATER STERILE IRR 1000ML POUR (IV SOLUTION) ×2 IMPLANT

## 2014-01-17 NOTE — Op Note (Signed)
     Patient name: Shannon Roberts MRN: [id number] DOB: 04/06/1951 Sex: female  01/17/2014 Pre-operative Diagnosis: Symptomatic right carotid stenosis Post-operative diagnosis:  Same Surgeon:  Rosetta Posner, M.D. Assistants:  Theda Sers Procedure:    right carotid Endarterectomy with Dacron patch angioplasty Anesthesia:  General Blood Loss:  See anesthesia record Specimens: none  Indications for surgery:  Right brain CVA  Procedure in detail:  The patient was taken to the operating and placed in the supine position. The neck was prepped and draped in the usual sterile fashion. An incision was made anterior to the sternocleidomastoid muscle and continued with electrocautery through the platysma muscle. The muscle was retracted posteriorly and the carotid sheath was opened. The facial vein was ligated with 2-0 silk ties and divided. The common carotid artery was encircled with an umbilical tape and Rummel tourniquet. Dissection was continued onto the carotid bifurcation. The superior thyroid artery was controlled with a 2-0 silk Potts tie. The external carotid organ was encircled with a vessel loop and the internal carotid was encircled with umbilical tape and Rummel tourniquet. The hypoglossal and vagus nerves were identified and preserved.  The patient was given systemic heparinization. After adequate circulation time, the internal,external and common carotid arteries were occluded. The common carotid was opened with an 11 blade and the arteriotomy was continued with Potts scissors onto the internal carotid artery. A 10 shunt was passed up the internal carotid artery, allowed to back bleed, and then passed down the common carotid artery. The shunt was secured with Rummel tourniquet. The endarterectomy was begun on the common carotid artery  plaque was divided proximally with Potts scissors. The endarterectomy was continued onto the carotid bifurcation. The external carotid was endarterectomized by  eversion technique and the internal carotid artery was endarterectomized in an open fashion. Remaining debris was removed from the endarterectomy plane. A Dacron patch was brought to the field and sewn as a patch angioplasty. Prior to completing the anastomosis, the shunt was removed and the usual flushing maneuvers were undertaken. The anastomosis was then completed and flow was restored first to the external and then the internal carotid artery. Excellent flow characteristics were noted with hand-held Doppler in the internal and external carotid arteries.  The patient was given protamine to reverse the heparin. Hemostasis was obtained with electrocautery. The wounds were irrigated with saline. The wound was closed by first reapproximating the sternocleidomastoid muscle over the carotid artery with interrupted 3-0 Vicryl sutures. Next, the platysma was closed with a running 3-0 Vicryl suture. The skin was closed with a 4-0 subcuticular Vicryl suture. Benzoin and Steri-Strips were applied to the incision. A sterile dressing was placed over the incision. All sponge and needle counts were correct. The patient was awakened in the operating room, neurologically intact. They were transferred to the PACU in stable condition.  Carotid stenosis at surgery:>80%  Disposition:  To PACU in stable condition,neurologically intact  Relevant Operative Details:  focal  Rosetta Posner, M.D. [facility] Office: [phone] Pager:  [phone]

## 2014-01-17 NOTE — H&P (View-Only) (Signed)
Subjective: Interval History: none.. no new neurologic deficits. Await results of 2-D echocardiogram. Apparently this was not recorded are not done. Making a progress regarding her left hand weakness.  Objective: Vital signs in last 24 hours: Temp:  [97.5 F (36.4 C)-98.4 F (36.9 C)] 98.1 F (36.7 C) (03/19 0559) Pulse Rate:  [66-80] 75 (03/19 0559) Resp:  [18-20] 20 (03/19 0559) BP: (107-146)/(54-80) 146/80 mmHg (03/19 0559) SpO2:  [93 %-99 %] 97 % (03/19 0559)  Intake/Output from previous day:   Intake/Output this shift:    No change. Clumsiness and decreased grip strength in her left hand  Lab Results:  Recent Labs  01/03/14 0840  WBC 6.7  HGB 11.3*  HCT 32.2*  PLT 279   BMET  Recent Labs  01/05/14 0930 01/06/14 0650  NA 123* 125*  K 4.5 4.7  CL 85* 89*  CO2 22 20  GLUCOSE 168* 139*  BUN 19 20  CREATININE 0.82 0.81  CALCIUM 9.8 9.9    Studies/Results: Dg Chest 2 View  01/03/2014   CLINICAL DATA:  Shortness of breath  EXAM: CHEST  2 VIEW  COMPARISON:  DG CHEST 2 VIEW dated 10/07/2011  FINDINGS: The heart size and mediastinal contours are within normal limits. Both lungs are clear. The visualized skeletal structures are unremarkable.  IMPRESSION: No active cardiopulmonary disease.   Electronically Signed   By: Margaree Mackintosh M.D.   On: 01/03/2014 07:49   Mr Jodene Nam Head Wo Contrast  01/02/2014   CLINICAL DATA:  Left-sided upper extremity weakness and facial droop for 1 week. Numbness.  EXAM: MRI HEAD WITHOUT CONTRAST  MRA HEAD WITHOUT CONTRAST  TECHNIQUE: Multiplanar, multiecho pulse sequences of the brain and surrounding structures were obtained without intravenous contrast. Angiographic images of the head were obtained using MRA technique without contrast.  COMPARISON:  None available.  FINDINGS: MRI HEAD FINDINGS  The diffusion-weighted images demonstrate scattered watershed type infarcts involving the right frontal lobe. These extending to the coronal radiata. T2  changes are associated with the infarct both along the cortex and white matter tracts.  There is no other significant white matter disease. Flow is present in the major intracranial arteries. The ventricles are of normal size. No significant extra-axial fluid collection is present.  The globes and orbits are intact. The paranasal sinuses are clear. Minimal fluid is present in the left mastoid air cells. No obstructing nasopharyngeal lesion is evident.  MRA HEAD FINDINGS  The right internal carotid artery is decreased in caliber relative to the left. No focal stenosis is evident. There is significant decreased signal in the right A1 segment compared to the left, likely reflecting decreased perfusion. A 2 mm infundibulum is present at the left posterior communicating artery. The left A1 segment is normal. The anterior communicating artery is patent. The left M1 segment and bifurcation is within normal limits. There is decreased caliber to the right M1 segment. Signal intensity in the right MCA bifurcations is decreased relative to the left. No focal stenosis is evident.  The right vertebral artery is hypoplastic and terminates at the PICA. The left vertebral artery feeds the basilar artery. There is no focal stenosis. The left PICA origin is visualized and normal. Both posterior cerebral arteries originate from the basilar tip. The PCA branch vessels are within normal limits.  IMPRESSION: 1. Acute nonhemorrhagic watershed territory infarcts involving the right frontal lobe. The majority these are white matter although there is a focal area of cortical infarct. 2. Decreased caliber of the right  internal carotid artery and decreased signal within the right A1 segment and MCA branches reflecting decreased profusion. This suggests a more proximal stenosis, likely at the right carotid artery bifurcation. These results were called by telephone at the time of interpretation on 01/02/2014 at 8:35 PM to Dr. Ellison Carwin ,  who verbally acknowledged these results.   Electronically Signed   By: Lawrence Santiago M.D.   On: 01/02/2014 20:35   Mr Brain Wo Contrast  01/02/2014   CLINICAL DATA:  Left-sided upper extremity weakness and facial droop for 1 week. Numbness.  EXAM: MRI HEAD WITHOUT CONTRAST  MRA HEAD WITHOUT CONTRAST  TECHNIQUE: Multiplanar, multiecho pulse sequences of the brain and surrounding structures were obtained without intravenous contrast. Angiographic images of the head were obtained using MRA technique without contrast.  COMPARISON:  None available.  FINDINGS: MRI HEAD FINDINGS  The diffusion-weighted images demonstrate scattered watershed type infarcts involving the right frontal lobe. These extending to the coronal radiata. T2 changes are associated with the infarct both along the cortex and white matter tracts.  There is no other significant white matter disease. Flow is present in the major intracranial arteries. The ventricles are of normal size. No significant extra-axial fluid collection is present.  The globes and orbits are intact. The paranasal sinuses are clear. Minimal fluid is present in the left mastoid air cells. No obstructing nasopharyngeal lesion is evident.  MRA HEAD FINDINGS  The right internal carotid artery is decreased in caliber relative to the left. No focal stenosis is evident. There is significant decreased signal in the right A1 segment compared to the left, likely reflecting decreased perfusion. A 2 mm infundibulum is present at the left posterior communicating artery. The left A1 segment is normal. The anterior communicating artery is patent. The left M1 segment and bifurcation is within normal limits. There is decreased caliber to the right M1 segment. Signal intensity in the right MCA bifurcations is decreased relative to the left. No focal stenosis is evident.  The right vertebral artery is hypoplastic and terminates at the PICA. The left vertebral artery feeds the basilar artery.  There is no focal stenosis. The left PICA origin is visualized and normal. Both posterior cerebral arteries originate from the basilar tip. The PCA branch vessels are within normal limits.  IMPRESSION: 1. Acute nonhemorrhagic watershed territory infarcts involving the right frontal lobe. The majority these are white matter although there is a focal area of cortical infarct. 2. Decreased caliber of the right internal carotid artery and decreased signal within the right A1 segment and MCA branches reflecting decreased profusion. This suggests a more proximal stenosis, likely at the right carotid artery bifurcation. These results were called by telephone at the time of interpretation on 01/02/2014 at 8:35 PM to Dr. Ellison Carwin , who verbally acknowledged these results.   Electronically Signed   By: Lawrence Santiago M.D.   On: 01/02/2014 20:35   Anti-infectives: Anti-infectives   Start     Dose/Rate Route Frequency Ordered Stop   01/05/14 1000  azithromycin (ZITHROMAX) tablet 500 mg     500 mg Oral Daily 01/05/14 0858     01/04/14 0000  azithromycin (ZITHROMAX) 500 MG tablet     500 mg Oral Daily 01/04/14 1012     01/03/14 1200  azithromycin (ZITHROMAX) 500 mg in dextrose 5 % 250 mL IVPB  Status:  Discontinued     500 mg 250 mL/hr over 60 Minutes Intravenous Every 24 hours 01/03/14 1056 01/05/14 0858  Assessment/Plan: s/p * No surgery found * Recovering from right brain stroke related to critical stenosis right internal carotid artery. For discharge after correction of hyponatremia. Will be readmitted in approximately one week for right carotid endarterectomy   LOS: 4 days   Shannon Roberts 01/06/2014, 8:31 AM

## 2014-01-17 NOTE — Progress Notes (Signed)
Pt's bp 94/56. Will give IVF bolus per MD order and cont to monitor. Pt is asymptomatic.

## 2014-01-17 NOTE — Transfer of Care (Signed)
Immediate Anesthesia Transfer of Care Note  Patient: Shannon Roberts  Procedure(s) Performed: Procedure(s): RIGHT CAROTID ARTERY ENDARTERECTOMY WITH DACRON PATCH ANGIOPLASTY (Right)  Patient Location: PACU  Anesthesia Type:General  Level of Consciousness: awake, alert , oriented and patient cooperative  Airway & Oxygen Therapy: Patient Spontanous Breathing and Patient connected to face mask oxygen  Post-op Assessment: Report given to PACU RN, Post -op Vital signs reviewed and stable and Patient moving all extremities X 4  Post vital signs: Reviewed and stable  Complications: No apparent anesthesia complications

## 2014-01-17 NOTE — Interval H&P Note (Signed)
History and Physical Interval Note:  01/17/2014 7:18 AM  Shannon Roberts  has presented today for surgery, with the diagnosis of Carotid stenosis with history of stroke  The various methods of treatment have been discussed with the patient and family. After consideration of risks, benefits and other options for treatment, the patient has consented to  Procedure(s): ENDARTERECTOMY CAROTID (Right) as a surgical intervention .  The patient's history has been reviewed, patient examined, no change in status, stable for surgery.  I have reviewed the patient's chart and labs.  Questions were answered to the patient's satisfaction.     Akeela Busk

## 2014-01-17 NOTE — Plan of Care (Signed)
Problem: Consults Goal: Tobacco Cessation referral if indicated Outcome: Completed/Met Date Met:  01/17/14 Pt has recently quit smoking since previous admission of CVA.   Problem: Phase I Progression Outcomes Goal: Vascular site scale level 0 - I Vascular Site Scale Level 0: No bruising/bleeding/hematoma Level I (Mild): Bruising/Ecchymosis, minimal bleeding/ooozing, palpable hematoma < 3 cm Level II (Moderate): Bleeding not affecting hemodynamic parameters, pseudoaneurysm, palpable hematoma > 3 cm Level III (Severe) Bleeding which affects hemodynamic parameters or retroperitoneal hemorrhage  Outcome: Progressing Covered by dressing.     Goal: Voiding after catheter removal Outcome: Not Met (add Reason) DTV, awaiting pt to mobilize for bedpan/BSC

## 2014-01-17 NOTE — Anesthesia Preprocedure Evaluation (Addendum)
Anesthesia Evaluation  Patient identified by MRN, date of birth, ID band Patient awake    Reviewed: Allergy & Precautions, H&P , Patient's Chart, lab work & pertinent test results, reviewed documented beta blocker date and time   History of Anesthesia Complications Negative for: history of anesthetic complications  Airway Mallampati: II TM Distance: >3 FB Neck ROM: Full  Mouth opening: Limited Mouth Opening  Dental  (+) Edentulous Upper, Dental Advisory Given, Partial Lower   Pulmonary shortness of breath, COPDformer smoker,          Cardiovascular hypertension, Pt. on medications and Pt. on home beta blockers + Valvular Problems/Murmurs MVP     Neuro/Psych Anxiety LUE weakness CVA, Residual Symptoms    GI/Hepatic Neg liver ROS, GERD-  Medicated and Controlled,  Endo/Other  negative endocrine ROS  Renal/GU negative Renal ROS     Musculoskeletal negative musculoskeletal ROS (+)   Abdominal   Peds  Hematology   Anesthesia Other Findings   Reproductive/Obstetrics negative OB ROS                        Anesthesia Physical Anesthesia Plan  ASA: III  Anesthesia Plan: General   Post-op Pain Management:    Induction: Intravenous  Airway Management Planned: Oral ETT  Additional Equipment:   Intra-op Plan:   Post-operative Plan: Extubation in OR  Informed Consent: I have reviewed the patients History and Physical, chart, labs and discussed the procedure including the risks, benefits and alternatives for the proposed anesthesia with the patient or authorized representative who has indicated his/her understanding and acceptance.   Dental advisory given  Plan Discussed with: CRNA and Surgeon  Anesthesia Plan Comments:        Anesthesia Quick Evaluation

## 2014-01-17 NOTE — Progress Notes (Signed)
Utilization review completed.  

## 2014-01-17 NOTE — Progress Notes (Signed)
Aline pressure 105/51. Cuff is 90/41. Giving second 500cc NS IVFB at this time. Dr.EArly notified. Orders received to treat/ go by aline pressures. Pt is aymptomatic. Will cont to monitor.

## 2014-01-17 NOTE — Anesthesia Postprocedure Evaluation (Signed)
Anesthesia Post Note  Patient: Shannon Roberts  Procedure(s) Performed: Procedure(s) (LRB): RIGHT CAROTID ARTERY ENDARTERECTOMY WITH DACRON PATCH ANGIOPLASTY (Right)  Anesthesia type: general  Patient location: PACU  Post pain: Pain level controlled  Post assessment: Patient's Cardiovascular Status Stable  Last Vitals:  Filed Vitals:   01/17/14 1300  BP:   Pulse:   Temp: 36.3 C  Resp:     Post vital signs: Reviewed and stable  Level of consciousness: sedated  Complications: No apparent anesthesia complications

## 2014-01-17 NOTE — Preoperative (Signed)
Beta Blockers   Reason not to administer Beta Blockers:Not ApplicablePt took am atenolol 0400.

## 2014-01-18 ENCOUNTER — Encounter (HOSPITAL_COMMUNITY): Payer: Self-pay | Admitting: Vascular Surgery

## 2014-01-18 LAB — CBC
HCT: 28.6 % — ABNORMAL LOW (ref 36.0–46.0)
Hemoglobin: 10 g/dL — ABNORMAL LOW (ref 12.0–15.0)
MCH: 32.1 pg (ref 26.0–34.0)
MCHC: 35 g/dL (ref 30.0–36.0)
MCV: 91.7 fL (ref 78.0–100.0)
Platelets: 237 10*3/uL (ref 150–400)
RBC: 3.12 MIL/uL — AB (ref 3.87–5.11)
RDW: 12.5 % (ref 11.5–15.5)
WBC: 14.6 10*3/uL — ABNORMAL HIGH (ref 4.0–10.5)

## 2014-01-18 LAB — BASIC METABOLIC PANEL
BUN: 12 mg/dL (ref 6–23)
CO2: 20 meq/L (ref 19–32)
Calcium: 8.8 mg/dL (ref 8.4–10.5)
Chloride: 98 mEq/L (ref 96–112)
Creatinine, Ser: 0.78 mg/dL (ref 0.50–1.10)
GFR calc Af Amer: >90 mL/min (ref >90)
GFR calc non Af Amer: 87 mL/min — ABNORMAL LOW (ref >90)
GLUCOSE: 125 mg/dL — AB (ref 70–99)
POTASSIUM: 4.6 meq/L (ref 3.7–5.3)
SODIUM: 133 meq/L — AB (ref 137–147)

## 2014-01-18 MED ORDER — OXYCODONE-ACETAMINOPHEN 5-325 MG PO TABS: 1.0000 | ORAL_TABLET | Freq: Four times a day (QID) | ORAL | Status: DC | PRN

## 2014-01-18 MED ORDER — ALBUTEROL SULFATE (2.5 MG/3ML) 0.083% IN NEBU: 2.5000 mg | INHALATION_SOLUTION | RESPIRATORY_TRACT | Status: DC | PRN

## 2014-01-18 MED ORDER — PNEUMOCOCCAL VAC POLYVALENT 25 MCG/0.5ML IJ INJ
0.5000 mL | INJECTION | Freq: Once | INTRAMUSCULAR | Status: AC
  Administered 2014-01-18: 0.5 mL via INTRAMUSCULAR
  Filled 2014-01-18: qty 0.5

## 2014-01-18 MED ORDER — PNEUMOCOCCAL VAC POLYVALENT 25 MCG/0.5ML IJ INJ: 0.5000 mL | INJECTION | INTRAMUSCULAR | Status: DC

## 2014-01-18 NOTE — Progress Notes (Signed)
DC instructions given to pt and daughter. Questions answered. Rx sent with patient. eICU and CCMT notified of DC. PIV DC, hemostasis achieved. Pt refused imdur at scheduled time, stated she will take it at home 2 hours after BP meds given. RN educated importance of this medication regimen. VSS. All belongings sent home with patient and family. Pt escorted by NT via wheelchair to private vehicle driven by daughter.

## 2014-01-18 NOTE — Progress Notes (Signed)
Subjective: Interval History: none.. comfortable night. Femoral neck soreness.  Objective: Vital signs in last 24 hours: Temp:  [97 F (36.1 C)-98 F (36.7 C)] 97.6 F (36.4 C) (03/31 0405) Pulse Rate:  [58-87] 71 (03/31 0405) Resp:  [8-20] 20 (03/31 0405) BP: (73-126)/(35-67) 118/61 mmHg (03/31 0405) SpO2:  [91 %-100 %] 96 % (03/31 0405) Arterial Line BP: (80-123)/(37-61) 113/52 mmHg (03/30 1619) Weight:  [199 lb 4.7 oz (90.4 kg)] 199 lb 4.7 oz (90.4 kg) (03/30 1330)  Intake/Output from previous day: 03/30 0701 - 03/31 0700 In: 3797.5 [P.O.:410; I.V.:3387.5] Out: 1700 [Urine:1500; Blood:200] Intake/Output this shift: Total I/O In: 240 [P.O.:240] Out: 1500 [Urine:1500]  Neck dressing removed with nicely healing incision. Minimal soreness. Neurologically no change from preop.  Lab Results:  Recent Labs  01/17/14 1530 01/18/14 0415  WBC 9.8 14.6*  HGB 8.6* 10.0*  HCT 25.0* 28.6*  PLT 204 237   BMET  Recent Labs  01/17/14 0613 01/17/14 1530 01/18/14 0415  NA 133*  --  133*  K 5.1  --  4.6  CL  --   --  98  CO2  --   --  20  GLUCOSE 129*  --  125*  BUN  --   --  12  CREATININE  --  0.77 0.78  CALCIUM  --   --  8.8    Studies/Results: Dg Chest 2 View  01/03/2014   CLINICAL DATA:  Shortness of breath  EXAM: CHEST  2 VIEW  COMPARISON:  DG CHEST 2 VIEW dated 10/07/2011  FINDINGS: The heart size and mediastinal contours are within normal limits. Both lungs are clear. The visualized skeletal structures are unremarkable.  IMPRESSION: No active cardiopulmonary disease.   Electronically Signed   By: Margaree Mackintosh M.D.   On: 01/03/2014 07:49   Mr Jodene Nam Head Wo Contrast  01/02/2014   CLINICAL DATA:  Left-sided upper extremity weakness and facial droop for 1 week. Numbness.  EXAM: MRI HEAD WITHOUT CONTRAST  MRA HEAD WITHOUT CONTRAST  TECHNIQUE: Multiplanar, multiecho pulse sequences of the brain and surrounding structures were obtained without intravenous contrast.  Angiographic images of the head were obtained using MRA technique without contrast.  COMPARISON:  None available.  FINDINGS: MRI HEAD FINDINGS  The diffusion-weighted images demonstrate scattered watershed type infarcts involving the right frontal lobe. These extending to the coronal radiata. T2 changes are associated with the infarct both along the cortex and white matter tracts.  There is no other significant white matter disease. Flow is present in the major intracranial arteries. The ventricles are of normal size. No significant extra-axial fluid collection is present.  The globes and orbits are intact. The paranasal sinuses are clear. Minimal fluid is present in the left mastoid air cells. No obstructing nasopharyngeal lesion is evident.  MRA HEAD FINDINGS  The right internal carotid artery is decreased in caliber relative to the left. No focal stenosis is evident. There is significant decreased signal in the right A1 segment compared to the left, likely reflecting decreased perfusion. A 2 mm infundibulum is present at the left posterior communicating artery. The left A1 segment is normal. The anterior communicating artery is patent. The left M1 segment and bifurcation is within normal limits. There is decreased caliber to the right M1 segment. Signal intensity in the right MCA bifurcations is decreased relative to the left. No focal stenosis is evident.  The right vertebral artery is hypoplastic and terminates at the PICA. The left vertebral artery feeds the basilar artery.  There is no focal stenosis. The left PICA origin is visualized and normal. Both posterior cerebral arteries originate from the basilar tip. The PCA branch vessels are within normal limits.  IMPRESSION: 1. Acute nonhemorrhagic watershed territory infarcts involving the right frontal lobe. The majority these are white matter although there is a focal area of cortical infarct. 2. Decreased caliber of the right internal carotid artery and  decreased signal within the right A1 segment and MCA branches reflecting decreased profusion. This suggests a more proximal stenosis, likely at the right carotid artery bifurcation. These results were called by telephone at the time of interpretation on 01/02/2014 at 8:35 PM to Dr. Ellison Carwin , who verbally acknowledged these results.   Electronically Signed   By: Lawrence Santiago M.D.   On: 01/02/2014 20:35   Mr Brain Wo Contrast  01/02/2014   CLINICAL DATA:  Left-sided upper extremity weakness and facial droop for 1 week. Numbness.  EXAM: MRI HEAD WITHOUT CONTRAST  MRA HEAD WITHOUT CONTRAST  TECHNIQUE: Multiplanar, multiecho pulse sequences of the brain and surrounding structures were obtained without intravenous contrast. Angiographic images of the head were obtained using MRA technique without contrast.  COMPARISON:  None available.  FINDINGS: MRI HEAD FINDINGS  The diffusion-weighted images demonstrate scattered watershed type infarcts involving the right frontal lobe. These extending to the coronal radiata. T2 changes are associated with the infarct both along the cortex and white matter tracts.  There is no other significant white matter disease. Flow is present in the major intracranial arteries. The ventricles are of normal size. No significant extra-axial fluid collection is present.  The globes and orbits are intact. The paranasal sinuses are clear. Minimal fluid is present in the left mastoid air cells. No obstructing nasopharyngeal lesion is evident.  MRA HEAD FINDINGS  The right internal carotid artery is decreased in caliber relative to the left. No focal stenosis is evident. There is significant decreased signal in the right A1 segment compared to the left, likely reflecting decreased perfusion. A 2 mm infundibulum is present at the left posterior communicating artery. The left A1 segment is normal. The anterior communicating artery is patent. The left M1 segment and bifurcation is within  normal limits. There is decreased caliber to the right M1 segment. Signal intensity in the right MCA bifurcations is decreased relative to the left. No focal stenosis is evident.  The right vertebral artery is hypoplastic and terminates at the PICA. The left vertebral artery feeds the basilar artery. There is no focal stenosis. The left PICA origin is visualized and normal. Both posterior cerebral arteries originate from the basilar tip. The PCA branch vessels are within normal limits.  IMPRESSION: 1. Acute nonhemorrhagic watershed territory infarcts involving the right frontal lobe. The majority these are white matter although there is a focal area of cortical infarct. 2. Decreased caliber of the right internal carotid artery and decreased signal within the right A1 segment and MCA branches reflecting decreased profusion. This suggests a more proximal stenosis, likely at the right carotid artery bifurcation. These results were called by telephone at the time of interpretation on 01/02/2014 at 8:35 PM to Dr. Ellison Carwin , who verbally acknowledged these results.   Electronically Signed   By: Lawrence Santiago M.D.   On: 01/02/2014 20:35   Dg Chest Port 1 View  01/06/2014   CLINICAL DATA:  New shortness of breath.  EXAM: PORTABLE CHEST - 1 VIEW  COMPARISON:  01/03/2014  FINDINGS: Evidence for hyperinflation and lucency in  the upper lungs. Findings could represent emphysema. There is no focal airspace disease or pulmonary edema. Heart size is normal. The trachea is midline. No acute bone abnormality.  IMPRESSION: No acute cardiopulmonary disease.   Electronically Signed   By: Markus Daft M.D.   On: 01/06/2014 21:40   Anti-infectives: Anti-infectives   Start     Dose/Rate Route Frequency Ordered Stop   01/17/14 2000  vancomycin (VANCOCIN) IVPB 1000 mg/200 mL premix     1,000 mg 200 mL/hr over 60 Minutes Intravenous Every 12 hours 01/17/14 1347 01/18/14 1959   01/16/14 1126  vancomycin (VANCOCIN) IVPB 1000  mg/200 mL premix     1,000 mg 200 mL/hr over 60 Minutes Intravenous 60 min pre-op 01/16/14 1126 01/17/14 0743      Assessment/Plan: s/p Procedure(s): RIGHT CAROTID ARTERY ENDARTERECTOMY WITH DACRON PATCH ANGIOPLASTY (Right) Table postop day #1. In mobilizing plan discharge later this morning. We'll see her again in 3 weeks for followup   LOS: 1 day   William Laske 01/18/2014, 6:29 AM

## 2014-01-18 NOTE — Progress Notes (Signed)
Physician notified: Gwenette Greet, [place] At: [phone]  Regarding: Unable to print AVS, med rec not completed. Also, added PNA vaccine.  Awaiting return response.   Returned Response at: 0817  Order(s): In OR, will let Gwenette Greet know.

## 2014-01-19 ENCOUNTER — Telehealth: Payer: Self-pay | Admitting: Vascular Surgery

## 2014-01-19 NOTE — Discharge Summary (Signed)
[facility] Discharge Summary   Patient ID:  Shannon Roberts MRN: [id number] DOB/AGE: 1951-01-06 63 y.o.  Admit date: 01/17/2014 Discharge date: 01/19/2014 Date of Surgery: 01/17/2014 Surgeon: Surgeon(s): Rosetta Posner, MD  Admission Diagnosis: Carotid stenosis with history of stroke  Discharge Diagnoses:  Carotid stenosis with history of stroke  Secondary Diagnoses: Past Medical History  Diagnosis Date  . IBS (irritable bowel syndrome)   . Anxiety   . GERD (gastroesophageal reflux disease)   . Depression   . Diverticulosis   . Hyperlipemia   . Hypertension   . Mitral valve prolapse   . Colitis   . COPD (chronic obstructive pulmonary disease)     no o2  . Sciatic nerve disease   . Heart murmur   . Stroke   . Shortness of breath   . Anemia     per hosp. - recently- pt. told that she is anemic    Procedure(s): RIGHT CAROTID ARTERY ENDARTERECTOMY WITH DACRON PATCH ANGIOPLASTY  Discharged Condition: good  HPI: 64 y/o female main concern of sudden onset of left hand weakness. She was sent for MRI of the brain which revealed acute stroke and she was transferred to [facility] cone for further evaluation. Patient explains that her left hand and wrist weakness and numbness started 6 days prior to admission.  She underwent right carotid Endarterectomy with Dacron patch angioplasty.  She was admitted over night and had an uneventful stay.   Neck dressing removed with nicely healing incision. Minimal soreness. Neurologically no change from preop.  She was discharged home in stable condition.     Hospital Course:  Shannon Roberts is a 63 y.o. female is S/P Right Procedure(s): RIGHT CAROTID ARTERY ENDARTERECTOMY WITH DACRON PATCH ANGIOPLASTY Extubated: POD # 0 Physical exam: No tongue deviation, symmetrical deviation Post-op wounds healing well Pt. Ambulating, voiding and taking PO diet without difficulty. Pt pain controlled with PO pain meds. Labs as  below Complications:none  Consults:     Significant Diagnostic Studies: CBC Lab Results  Component Value Date   WBC 14.6* 01/18/2014   HGB 10.0* 01/18/2014   HCT 28.6* 01/18/2014   MCV 91.7 01/18/2014   PLT 237 01/18/2014    BMET    Component Value Date/Time   NA 133* 01/18/2014 0415   K 4.6 01/18/2014 0415   CL 98 01/18/2014 0415   CO2 20 01/18/2014 0415   GLUCOSE 125* 01/18/2014 0415   BUN 12 01/18/2014 0415   CREATININE 0.78 01/18/2014 0415   CREATININE 0.98 01/02/2014 1808   CALCIUM 8.8 01/18/2014 0415   GFRNONAA 87* 01/18/2014 0415   GFRAA >90 01/18/2014 0415   COAG Lab Results  Component Value Date   INR 0.93 01/14/2014   INR 0.95 01/02/2014     Disposition:  Discharge to :Home Discharge Orders   Future Appointments Provider Department Dept Phone   02/08/2014 8:30 AM Rosetta Posner, MD [facility] [phone]   Future Orders Complete By Expires   Call MD for:  redness, tenderness, or signs of infection (pain, swelling, bleeding, redness, odor or green/yellow discharge around incision site)  As directed    Call MD for:  severe or increased pain, loss or decreased feeling  in affected limb(s)  As directed    Call MD for:  temperature >100.5  As directed    Driving Restrictions  As directed    Comments:     No driving for 2 weeks   Increase activity slowly  As directed    Comments:     Walk with assistance use walker or cane as needed   Lifting restrictions  As directed    Comments:     No lifting for 6 weeks   May shower   As directed    Resume previous diet  As directed        Medication List         acetaminophen 500 MG tablet  Commonly known as:  TYLENOL  Take 500-1,000 mg by mouth every 6 (six) hours as needed for mild pain.     albuterol 108 (90 BASE) MCG/ACT inhaler  Commonly known as:  PROVENTIL HFA;VENTOLIN HFA  Inhale 1-2 puffs into the lungs every 6 (six) hours as needed for wheezing or shortness of breath (SOB).      albuterol-ipratropium 18-103 MCG/ACT inhaler  Commonly known as:  COMBIVENT  Inhale 1 puff into the lungs every 4 (four) hours.     ALPRAZolam 0.25 MG tablet  Commonly known as:  XANAX  Take 0.25 mg by mouth 2 (two) times daily as needed for anxiety (anxiety). For sleep.     amLODipine 10 MG tablet  Commonly known as:  NORVASC  Take 10 mg by mouth daily.     atenolol 50 MG tablet  Commonly known as:  TENORMIN  Take 50 mg by mouth daily.     benzonatate 100 MG capsule  Commonly known as:  TESSALON  Take 100 mg by mouth 3 (three) times daily as needed for cough.     budesonide 0.25 MG/2ML nebulizer solution  Commonly known as:  PULMICORT  Take 2 mLs (0.25 mg total) by nebulization 2 (two) times daily.     clopidogrel 75 MG tablet  Commonly known as:  PLAVIX  Take 75 mg by mouth daily with breakfast.     DULoxetine 60 MG capsule  Commonly known as:  CYMBALTA  Take 60 mg by mouth daily.     fluticasone 50 MCG/ACT nasal spray  Commonly known as:  FLONASE  Place 2 sprays into the nose every 6 (six) hours as needed for allergies.     guaiFENesin-codeine 100-10 MG/5ML syrup  Take 5 mLs by mouth every 6 (six) hours as needed for cough.     isosorbide mononitrate 30 MG 24 hr tablet  Commonly known as:  IMDUR  Take 30 mg by mouth 2 (two) times daily.     lisinopril 20 MG tablet  Commonly known as:  PRINIVIL,ZESTRIL  Take 1 tablet (20 mg total) by mouth daily.     nicotine 21 mg/24hr patch  Commonly known as:  NICODERM CQ - dosed in mg/24 hours  Place 1 patch (21 mg total) onto the skin daily.     oxyCODONE-acetaminophen 5-325 MG per tablet  Commonly known as:  PERCOCET/ROXICET  Take 1 tablet by mouth every 6 (six) hours as needed for moderate pain.     pantoprazole 40 MG tablet  Commonly known as:  PROTONIX  Take 40 mg by mouth 2 (two) times daily as needed. For stomach upset.     simvastatin 10 MG tablet  Commonly known as:  ZOCOR  Take 1 tablet (10 mg total) by  mouth at bedtime.       Verbal and written Discharge instructions given to the patient. Wound care per Discharge AVS     Follow-up Information   Follow up with EARLY, TODD, MD In 3 weeks. (Office will call you to arrange your appt (sent))  Specialty:  Vascular Surgery   Contact information:   [place] [id number] [phone]       Signed: Laurence Slate [facility] 01/19/2014, 8:42 AM  --- For VQI Registry use --- Instructions: Press F2 to tab through selections.  Delete question if not applicable.   Modified Rankin score at D/C (0-6): Rankin Score=0  IV medication needed for:  1. Hypertension: No 2. Hypotension: No  Post-op Complications: No  1. Post-op CVA or TIA: No  If yes: Event classification (right eye, left eye, right cortical, left cortical, verterobasilar, other):   If yes: Timing of event (intra-op, <6 hrs post-op, >=6 hrs post-op, unknown):   2. CN injury: No  If yes: CN  injuried   3. Myocardial infarction: No  If yes: Dx by (EKG or clinical, Troponin):   4.  CHF: No  5.  Dysrhythmia (new): No  6. Wound infection: No  7. Reperfusion symptoms: No  8. Return to OR: No  If yes: return to OR for (bleeding, neurologic, other CEA incision, other):   Discharge medications: Statin use:  Yes ASA use:  No  for medical reason   Beta blocker use:  Yes ACE-Inhibitor use:  yes P2Y12 Antagonist use: [ ]  None, [x ] Plavix, [ ]  Plasugrel, [ ]  Ticlopinine, [ ]  Ticagrelor, [ ]  Other, [ ]  No for medical reason, [ ]  Non-compliant, [ ]  Not-indicated Anti-coagulant use:  [x ] None, [ ]  Warfarin, [ ]  Rivaroxaban, [ ]  Dabigatran, [ ]  Other, [ ]  No for medical reason, [ ]  Non-compliant, [ ]  Not-indicated

## 2014-01-19 NOTE — Telephone Encounter (Addendum)
Message copied by Gena Fray on Wed Jan 19, 2014  9:16 AM ------      Message from: Mena Goes      Created: Tue Jan 18, 2014 10:53 AM      Regarding: Schedule                   ----- Message -----         From: Gabriel Earing, PA-C         Sent: 01/18/2014  10:08 AM           To: [facility] Charge Pool            S/p right carotid Endarterectomy with Dacron patch angioplasty 01/17/14.  F/u with Dr. Donnetta Hutching in 3 weeks.            Thanks,      SAmantha ------  01/19/14: spoke with pt, dpm

## 2014-01-20 ENCOUNTER — Encounter: Payer: Self-pay | Admitting: Family Medicine

## 2014-02-07 ENCOUNTER — Encounter: Payer: Self-pay | Admitting: Vascular Surgery

## 2014-02-08 ENCOUNTER — Encounter: Payer: Self-pay | Admitting: Vascular Surgery

## 2014-02-08 ENCOUNTER — Ambulatory Visit (INDEPENDENT_AMBULATORY_CARE_PROVIDER_SITE_OTHER): Payer: No Typology Code available for payment source | Admitting: Vascular Surgery

## 2014-02-08 VITALS — BP 126/70 | HR 83 | Ht 65.0 in | Wt 197.0 lb

## 2014-02-08 DIAGNOSIS — I6529 Occlusion and stenosis of unspecified carotid artery: Secondary | ICD-10-CM | POA: Insufficient documentation

## 2014-02-08 DIAGNOSIS — Z48812 Encounter for surgical aftercare following surgery on the circulatory system: Secondary | ICD-10-CM

## 2014-02-08 NOTE — Progress Notes (Signed)
Here today for followup of her right carotid endarterectomy for symptomatic disease on 01/17/2014. She had a preoperative right greater than leaving her with left arm weakness and clumsiness. She did quite well in the hospital discharged home on postoperative day #1. Portion she has not smoked for 5 weeks following the stroke. She does use a patch. She's had no new neurologic deficits.  On physical exam her right neck incision is well-healed with the normal healing ridge. There is no inflammation. She has no carotid bruits bilaterally. I do not sense any neurologic deficits. She feels that there is some clumsiness in her fingers.  Impression and plan stable following right carotid endarterectomy for symptomatic carotid stenosis. She will resume full activities. We will see her in 6 months with repeat carotid duplex. She is questioning the need for ongoing Plavix and I've asked her to discuss this with Dr. Leonie Man and her appointment in June of 2015

## 2014-02-08 NOTE — Addendum Note (Signed)
Addended by: Dorthula Rue L on: 02/08/2014 03:59 PM   Modules accepted: Orders

## 2014-04-04 ENCOUNTER — Telehealth: Payer: Self-pay | Admitting: Nurse Practitioner

## 2014-04-04 NOTE — Telephone Encounter (Signed)
Called pt to schedule an appt with Dr. Leonie Man on 04/06/14 with Dr. Leonie Man, because pt stated that she did not want to see a NP. I advised the pt that if she has any other problems, questions or concerns to call the office. Pt verbalized understanding.

## 2014-04-04 NOTE — Telephone Encounter (Signed)
Patient calling about hosp follow up appointment with Jeani Hawking on 04/19/14. Patient does not want to see nurse practioner & doesn't want to wait to schedule with Dr Erlinda Hong. Please call & advise

## 2014-04-06 ENCOUNTER — Ambulatory Visit (INDEPENDENT_AMBULATORY_CARE_PROVIDER_SITE_OTHER): Payer: No Typology Code available for payment source | Admitting: Neurology

## 2014-04-06 ENCOUNTER — Encounter: Payer: Self-pay | Admitting: Neurology

## 2014-04-06 VITALS — BP 144/79 | HR 89 | Ht 64.0 in | Wt 201.0 lb

## 2014-04-06 DIAGNOSIS — I6529 Occlusion and stenosis of unspecified carotid artery: Secondary | ICD-10-CM

## 2014-04-06 NOTE — Progress Notes (Signed)
[facility] [address] [place] [address] [phone]       OFFICE FOLLOW-UP NOTE  [name] Date of Birth:  03-15-1951 Medical Record Number:  [id number]   HPI: 52 year Caucasian lady seen today for the first office follow visit for hospital admission for stroke. She presented on 01/02/14 with left hand weakness to urgent care. She was refer to [facility] for an MRI which revealed an acute stroke in the right frontal watershed distribution.   Subsequent evaluation  By MRAshowed decrease caliber of the right internal carotid artery and decreased signal in the right A1 segment and MCA branches. Carotid ultrasound showed greater than 80% proximal right ICA stenosis. Her left hand weakness improved. Lipid profile showed total cholesterol 158, LDL 91 and HDL 47 mg percent. Hemoglobin A1c was 6.0. EKG and telemetry monitoring was normal sinus rhythm. 2DEcho showed normal   ejection fraction. Patient was started on aspirin for stroke prevention and Zocor for lipids and counselled to quit smoking. She  had elective right carotid endarterectomy a few weeks later by Dr. Donnetta Hutching and the surgery went well and she is doing great. She is no significant distal weakness and feels she starting Plavix well without significant bleeding or bruising. She is tolerating Zocor but does complain of mild muscle aches. She states her blood pressure is usually better controlled due to slightly elevated in office today. She is quit smoking is doing well but has unfortunately gained some weight. She has not been exercising regularly on monitoring her diet. She still has some diminished fine motor skills in the left hand but overall seems to be doing great  ROS:   14 system review of systems is positive for weight gain, fatigue, shortness of breath, aching muscles, headache, depression, anxiety, too much sleep decreased energy, change in appetite, insomnia and sleepiness.  PMH:  Past Medical  History  Diagnosis Date  . IBS (irritable bowel syndrome)   . Anxiety   . GERD (gastroesophageal reflux disease)   . Depression   . Diverticulosis   . Hyperlipemia   . Hypertension   . Mitral valve prolapse   . Colitis   . COPD (chronic obstructive pulmonary disease)     no o2  . Sciatic nerve disease   . Heart murmur   . Stroke   . Shortness of breath   . Anemia     per hosp. - recently- pt. told that she is anemic    Social History:  History   Social History  . Marital Status: Divorced    Spouse Name: N/A    Number of Children: 2  . Years of Education: N/A   Occupational History  . Bookeeper    Social History Main Topics  . Smoking status: Former Smoker -- 1.00 packs/day for 40 years    Types: Cigarettes    Quit date: 01/02/2014  . Smokeless tobacco: Never Used  . Alcohol Use: No  . Drug Use: No  . Sexual Activity: Not on file   Other Topics Concern  . Not on file   Social History Narrative   2-3 caffeine drinks daily     Medications:   Current Outpatient Prescriptions on File Prior to Visit  Medication Sig Dispense Refill  . acetaminophen (TYLENOL) 500 MG tablet Take 500-1,000 mg by mouth every 6 (six) hours as needed for mild pain.      Marland Kitchen albuterol (PROVENTIL HFA;VENTOLIN HFA) 108 (90 BASE) MCG/ACT inhaler Inhale 1-2 puffs  into the lungs every 6 (six) hours as needed for wheezing or shortness of breath (SOB).      Marland Kitchen albuterol-ipratropium (COMBIVENT) 18-103 MCG/ACT inhaler Inhale 1 puff into the lungs every 4 (four) hours.      . ALPRAZolam (XANAX) 0.25 MG tablet Take 0.25 mg by mouth 2 (two) times daily as needed for anxiety (anxiety). For sleep.      Marland Kitchen amLODipine (NORVASC) 10 MG tablet Take 10 mg by mouth daily.      Marland Kitchen atenolol (TENORMIN) 50 MG tablet Take 50 mg by mouth daily.      . clopidogrel (PLAVIX) 75 MG tablet Take 75 mg by mouth daily with breakfast.      . DULoxetine (CYMBALTA) 60 MG capsule Take 60 mg by mouth daily.      . fluticasone  (FLONASE) 50 MCG/ACT nasal spray Place 2 sprays into the nose every 6 (six) hours as needed for allergies.       Marland Kitchen lisinopril (PRINIVIL,ZESTRIL) 20 MG tablet Take 1 tablet (20 mg total) by mouth daily.  30 tablet  1  . nicotine (NICODERM CQ - DOSED IN MG/24 HOURS) 21 mg/24hr patch Place 1 patch (21 mg total) onto the skin daily.  28 patch  2  . oxyCODONE-acetaminophen (PERCOCET/ROXICET) 5-325 MG per tablet Take 1 tablet by mouth every 6 (six) hours as needed for moderate pain.  30 tablet  0  . pantoprazole (PROTONIX) 40 MG tablet Take 40 mg by mouth 2 (two) times daily as needed. For stomach upset.      . simvastatin (ZOCOR) 10 MG tablet Take 1 tablet (10 mg total) by mouth at bedtime.  30 tablet  3  . isosorbide mononitrate (IMDUR) 30 MG 24 hr tablet Take 30 mg by mouth 2 (two) times daily.       No current facility-administered medications on file prior to visit.    Allergies:   Allergies  Allergen Reactions  . Dapsone     flushes  . Niacin And Related     flushes  . Amoxicillin Rash  . Carvedilol Palpitations    Physical Exam General: well developed, well nourished, seated, in no evident distress Head: head normocephalic and atraumatic. Orohparynx benign Neck: supple with no carotid or supraclavicular bruits. Healed right carotid endarterectomy scar Cardiovascular: regular rate and rhythm, no murmurs Musculoskeletal: no deformity Skin:  no rash/petichiae Vascular:  Normal pulses all extremities Filed Vitals:   04/06/14 1425  BP: 144/79  Pulse: 89   Neurologic Exam Mental Status: Awake and fully alert. Oriented to place and time. Recent and remote memory intact. Attention span, concentration and fund of knowledge appropriate. Mood and affect appropriate.  Cranial Nerves: Fundoscopic exam reveals sharp disc margins. Pupils equal, briskly reactive to light. Extraocular movements full without nystagmus. Visual fields full to confrontation. Hearing intact. Facial sensation intact.  Face, tongue, palate moves normally and symmetrically.  Motor: Normal bulk and tone. Normal strength in all tested extremity muscles. Sensory.: intact to touch and pinprick and vibratory sensation.  Coordination: Rapid alternating movements normal in all extremities. Finger-to-nose and heel-to-shin performed accurately bilaterally. Gait and Station: Arises from chair without difficulty. Stance is normal. Gait demonstrates normal stride length and balance . Able to heel, toe and tandem walk without difficulty.  Reflexes: 1+ and symmetric. Toes downgoing.   NIHSS  0 Modified Rankin  1   ASSESSMENT: 1 year Caucasian lady with a right brain watershed infarct symptomatic from greater than 80% proximal right ICA stenosis status  post elective carotid endarterectomy is doing clinically quite well. Vascular factors of carotid stenosis, smoking, hypertension, hyperlipidemia and mild obesity.    PLAN: Patient was advised to continue Plavix for secondary stroke prevention and Zocor for hyperlipidemia and keep LDL cholesterol below 100 mg percent. I complimented her on smoking cessation and advised her to continue to do so. Continue followup with Dr.Early for carotid surgery followup Return for followup in 6 months with [name], NP or call  earlier if necessary    Note: This document was prepared with digital dictation and possible smart phrase technology. Any transcriptional errors that result from this process are unintentional

## 2014-04-06 NOTE — Patient Instructions (Addendum)
Patient was advised to continue Plavix for secondary stroke prevention and Zocor for hyperlipidemia and keep LDL cholesterol below 100 mg percent. I complimented her on smoking cessation and advised her to continue to do so. Continue followup with Dr.Early for carotid surgery followup Return for followup in 6 months with [name], NP or call  earlier if necessary  Stroke Prevention Some medical conditions and behaviors are associated with an increased chance of having a stroke. You may prevent a stroke by making healthy choices and managing medical conditions. HOW CAN I REDUCE MY RISK OF HAVING A STROKE?   Stay physically active. Get at least 30 minutes of activity on most or all days.  Do not smoke. It may also be helpful to avoid exposure to secondhand smoke.  Limit alcohol use. Moderate alcohol use is considered to be:  No more than 2 drinks per day for men.  No more than 1 drink per day for nonpregnant women.  Eat healthy foods. This involves  Eating 5 or more servings of fruits and vegetables a day.  Following a diet that addresses high blood pressure (hypertension), high cholesterol, diabetes, or obesity.  Manage your cholesterol levels.  A diet low in saturated fat, trans fat, and cholesterol and high in fiber may control cholesterol levels.  Take any prescribed medicines to control cholesterol as directed by your health care provider.  Manage your diabetes.  A controlled-carbohydrate, controlled-sugar diet is recommended to manage diabetes.  Take any prescribed medicines to control diabetes as directed by your health care provider.  Control your hypertension.  A low-salt (sodium), low-saturated fat, low-trans fat, and low-cholesterol diet is recommended to manage hypertension.  Take any prescribed medicines to control hypertension as directed by your health care provider.  Maintain a healthy weight.  A reduced-calorie, low-sodium, low-saturated fat, low-trans fat,  low-cholesterol diet is recommended to manage weight.  Stop drug abuse.  Avoid taking birth control pills.  Talk to your health care provider about the risks of taking birth control pills if you are over 83 years old, smoke, get migraines, or have ever had a blood clot.  Get evaluated for sleep disorders (sleep apnea).  Talk to your health care provider about getting a sleep evaluation if you snore a lot or have excessive sleepiness.  Take medicines as directed by your health care provider.  For some people, aspirin or blood thinners (anticoagulants) are helpful in reducing the risk of forming abnormal blood clots that can lead to stroke. If you have the irregular heart rhythm of atrial fibrillation, you should be on a blood thinner unless there is a good reason you cannot take them.  Understand all your medicine instructions.  Make sure that other other conditions (such as anemia or atherosclerosis) are addressed. SEEK IMMEDIATE MEDICAL CARE IF:   You have sudden weakness or numbness of the face, arm, or leg, especially on one side of the body.  Your face or eyelid droops to one side.  You have sudden confusion.  You have trouble speaking (aphasia) or understanding.  You have sudden trouble seeing in one or both eyes.  You have sudden trouble walking.  You have dizziness.  You have a loss of balance or coordination.  You have a sudden, severe headache with no known cause.  You have new chest pain or an irregular heartbeat. Any of these symptoms may represent a serious problem that is an emergency. Do not wait to see if the symptoms will go away. Get medical  help at once. Call your local emergency services  (911 in [place] Do not drive yourself to the hospital. Document Released: 11/14/2004 Document Revised: 07/28/2013 Document Reviewed: 04/09/2013 [facility] Patient Information 2015 [place], [place]. This information is not intended to replace advice given to you by your health  care provider. Make sure you discuss any questions you have with your health care provider.

## 2014-04-19 ENCOUNTER — Ambulatory Visit: Payer: Self-pay | Admitting: Nurse Practitioner

## 2014-08-05 ENCOUNTER — Other Ambulatory Visit: Payer: Self-pay

## 2014-08-09 ENCOUNTER — Other Ambulatory Visit (HOSPITAL_COMMUNITY): Payer: No Typology Code available for payment source

## 2014-08-09 ENCOUNTER — Ambulatory Visit: Payer: No Typology Code available for payment source | Admitting: Vascular Surgery

## 2014-09-01 ENCOUNTER — Ambulatory Visit (INDEPENDENT_AMBULATORY_CARE_PROVIDER_SITE_OTHER): Payer: No Typology Code available for payment source | Admitting: Gastroenterology

## 2014-09-01 ENCOUNTER — Encounter: Payer: Self-pay | Admitting: Gastroenterology

## 2014-09-01 VITALS — BP 114/70 | HR 84 | Ht 64.5 in | Wt 201.1 lb

## 2014-09-01 DIAGNOSIS — K30 Functional dyspepsia: Secondary | ICD-10-CM | POA: Insufficient documentation

## 2014-09-01 NOTE — Assessment & Plan Note (Signed)
Dyspeptic symptoms could be due to reflux, ulcer or nonulcer dyspepsia.  Gastroparesis may also be considered although she does not have nausea, per se.  Symptoms do not seem to be related to any particular foods.  Recommendations #1  Upper endoscopy #2 to consider gastric emptying scan any results of #1  Patient will continue on Plavix while undergoing upper endoscopy.

## 2014-09-01 NOTE — Patient Instructions (Signed)
You have been scheduled for an endoscopy. Please follow written instructions given to you at your visit today. If you use inhalers (even only as needed), please bring them with you on the day of your procedure. Your physician has requested that you go to www.startemmi.com and enter the access code given to you at your visit today. This web site gives a general overview about your procedure. However, you should still follow specific instructions given to you by our office regarding your preparation for the procedure.  Stay on Plavix per Dr Deatra Ina

## 2014-09-01 NOTE — Progress Notes (Signed)
_                                                                                                                History of Present Illness:  [name] a 63 year old white female referred for evaluation of dyspepsia.  For several months she has been complaining of excess belching, abdominal bloating and excess gas.  Symptoms are especially severe when she lays down.  She denies pyrosisor nausea, per se.  She takes Protonix and simethicone with partial relief.  She denies dysphagia.she is on no gastric irritants including nonsteroidals.she has a history of a CVA and is on Plavix.  In 2012 patient underwent Maloney dilation of an early esophageal stricture.  Colonoscopy was negative except for hemorrhoids.   Past Medical History  Diagnosis Date  . IBS (irritable bowel syndrome)   . Anxiety   . GERD (gastroesophageal reflux disease)   . Depression   . Diverticulosis   . Hyperlipemia   . Hypertension   . Mitral valve prolapse   . Colitis   . COPD (chronic obstructive pulmonary disease)     no o2  . Sciatic nerve disease   . Heart murmur   . Stroke   . Shortness of breath   . Anemia     per hosp. - recently- pt. told that she is anemic   Past Surgical History  Procedure Laterality Date  . Cholecystectomy    . Endarterectomy Right 01/17/2014    Procedure: RIGHT CAROTID ARTERY ENDARTERECTOMY WITH DACRON PATCH ANGIOPLASTY;  Surgeon: Rosetta Posner, MD;  Location: [facility] OR;  Service: Vascular;  Laterality: Right;   family history includes Breast cancer in her mother; Colon cancer in her paternal aunt; Diabetes in her maternal aunt; Heart disease in her paternal grandfather; Kidney disease in her maternal aunt; Ovarian cancer in her mother; Pancreatic cancer in her maternal grandmother. Current Outpatient Prescriptions  Medication Sig Dispense Refill  . albuterol-ipratropium (COMBIVENT) 18-103 MCG/ACT inhaler Inhale 1 puff into the lungs every 4 (four) hours.    .  ALPRAZolam (XANAX) 0.25 MG tablet Take 0.25 mg by mouth 2 (two) times daily as needed for anxiety (anxiety). For sleep.    Marland Kitchen amLODipine (NORVASC) 10 MG tablet Take 10 mg by mouth daily.    Marland Kitchen atenolol (TENORMIN) 50 MG tablet Take 50 mg by mouth daily.    . clopidogrel (PLAVIX) 75 MG tablet Take 75 mg by mouth daily with breakfast.    . DULoxetine (CYMBALTA) 60 MG capsule Take 60 mg by mouth daily.    . fluticasone (FLONASE) 50 MCG/ACT nasal spray Place 2 sprays into the nose every 6 (six) hours as needed for allergies.     . isosorbide mononitrate (IMDUR) 30 MG 24 hr tablet Take 30 mg by mouth 2 (two) times daily.    Marland Kitchen lisinopril (PRINIVIL,ZESTRIL) 20 MG tablet Take 1 tablet (20 mg total) by mouth daily. 30 tablet 1  . pantoprazole (PROTONIX) 40 MG tablet Take 40 mg by mouth 2 (two) times  daily as needed. For stomach upset.    . simvastatin (ZOCOR) 10 MG tablet Take 1 tablet (10 mg total) by mouth at bedtime. 30 tablet 3  . traMADol (ULTRAM) 50 MG tablet Take 50 mg by mouth as needed.    Marland Kitchen acetaminophen (TYLENOL) 500 MG tablet Take 500-1,000 mg by mouth every 6 (six) hours as needed for mild pain.     No current facility-administered medications for this visit.   Allergies as of 09/01/2014 - Review Complete 09/01/2014  Allergen Reaction Noted  . Dapsone  01/14/2014  . Niacin and related  01/14/2014  . Amoxicillin Rash 05/13/2011  . Carvedilol Palpitations 01/14/2014    reports that she quit smoking about 7 months ago. Her smoking use included Cigarettes. She has a 40 pack-year smoking history. She has never used smokeless tobacco. She reports that she does not drink alcohol or use illicit drugs.   Review of Systems: Pertinent positive and negative review of systems were noted in the above HPI section. All other review of systems were otherwise negative.  Vital signs were reviewed in today's medical record Physical Exam: General: Well developed , well nourished, no acute distress Skin:  anicteric Head: Normocephalic and atraumatic Eyes:  sclerae anicteric, EOMI Ears: Normal auditory acuity Mouth: No deformity or lesions Neck: Supple, no masses or thyromegaly Lungs: Clear throughout to auscultation Heart: Regular rate and rhythm; no murmurs, rubs or bruits Abdomen: Soft, non tender and non distended. No masses, hepatosplenomegaly or hernias noted. Normal Bowel sounds. There is no succussion splash Rectal:deferred Musculoskeletal: Symmetrical with no gross deformities  Skin: No lesions on visible extremities Pulses:  Normal pulses noted Extremities: No clubbing, cyanosis, edema or deformities noted Neurological: Alert oriented x 4, grossly nonfocal Cervical Nodes:  No significant cervical adenopathy Inguinal Nodes: No significant inguinal adenopathy Psychological:  Alert and cooperative. Normal mood and affect  See Assessment and Plan under Problem List

## 2014-09-05 ENCOUNTER — Encounter: Payer: Self-pay | Admitting: Vascular Surgery

## 2014-09-05 ENCOUNTER — Telehealth: Payer: Self-pay | Admitting: Neurology

## 2014-09-05 NOTE — Telephone Encounter (Signed)
Called patient regarding rescheduling 10/10/14 appointment per Jeani Hawking leaving, offered patient first available with Dr. Leonie Man in February but patient states she needs to be seen much sooner than that due to "too many things happening" with her body, please return call and advise.

## 2014-09-05 NOTE — Telephone Encounter (Signed)
Patient was scheduled with NP [name] on 11/18 at 3:30 pm.

## 2014-09-06 ENCOUNTER — Ambulatory Visit (HOSPITAL_COMMUNITY)
Admission: RE | Admit: 2014-09-06 | Discharge: 2014-09-06 | Disposition: A | Discharge status: Home / Self Care | Payer: Medicare Other | Source: Ambulatory Visit | Attending: Vascular Surgery | Admitting: Vascular Surgery

## 2014-09-06 ENCOUNTER — Encounter: Payer: Self-pay | Admitting: Family

## 2014-09-06 ENCOUNTER — Ambulatory Visit (INDEPENDENT_AMBULATORY_CARE_PROVIDER_SITE_OTHER): Payer: Medicare Other | Admitting: Family

## 2014-09-06 VITALS — BP 110/60 | HR 70 | Resp 16 | Ht 65.0 in | Wt 201.0 lb

## 2014-09-06 DIAGNOSIS — Z48812 Encounter for surgical aftercare following surgery on the circulatory system: Secondary | ICD-10-CM

## 2014-09-06 DIAGNOSIS — I6523 Occlusion and stenosis of bilateral carotid arteries: Secondary | ICD-10-CM

## 2014-09-06 DIAGNOSIS — M79651 Pain in right thigh: Secondary | ICD-10-CM | POA: Insufficient documentation

## 2014-09-06 NOTE — Addendum Note (Signed)
Addended by: Mena Goes on: 09/06/2014 02:34 PM   Modules accepted: Orders

## 2014-09-06 NOTE — Patient Instructions (Addendum)
Stroke Prevention Some medical conditions and behaviors are associated with an increased chance of having a stroke. You may prevent a stroke by making healthy choices and managing medical conditions. HOW CAN I REDUCE MY RISK OF HAVING A STROKE?   Stay physically active. Get at least 30 minutes of activity on most or all days.  Do not smoke. It may also be helpful to avoid exposure to secondhand smoke.  Limit alcohol use. Moderate alcohol use is considered to be:  No more than 2 drinks per day for men.  No more than 1 drink per day for nonpregnant women.  Eat healthy foods. This involves:  Eating 5 or more servings of fruits and vegetables a day.  Making dietary changes that address high blood pressure (hypertension), high cholesterol, diabetes, or obesity.  Manage your cholesterol levels.  Making food choices that are high in fiber and low in saturated fat, trans fat, and cholesterol may control cholesterol levels.  Take any prescribed medicines to control cholesterol as directed by your health care provider.  Manage your diabetes.  Controlling your carbohydrate and sugar intake is recommended to manage diabetes.  Take any prescribed medicines to control diabetes as directed by your health care provider.  Control your hypertension.  Making food choices that are low in salt (sodium), saturated fat, trans fat, and cholesterol is recommended to manage hypertension.  Take any prescribed medicines to control hypertension as directed by your health care provider.  Maintain a healthy weight.  Reducing calorie intake and making food choices that are low in sodium, saturated fat, trans fat, and cholesterol are recommended to manage weight.  Stop drug abuse.  Avoid taking birth control pills.  Talk to your health care provider about the risks of taking birth control pills if you are over 35 years old, smoke, get migraines, or have ever had a blood clot.  Get evaluated for sleep  disorders (sleep apnea).  Talk to your health care provider about getting a sleep evaluation if you snore a lot or have excessive sleepiness.  Take medicines only as directed by your health care provider.  For some people, aspirin or blood thinners (anticoagulants) are helpful in reducing the risk of forming abnormal blood clots that can lead to stroke. If you have the irregular heart rhythm of atrial fibrillation, you should be on a blood thinner unless there is a good reason you cannot take them.  Understand all your medicine instructions.  Make sure that other conditions (such as anemia or atherosclerosis) are addressed. SEEK IMMEDIATE MEDICAL CARE IF:   You have sudden weakness or numbness of the face, arm, or leg, especially on one side of the body.  Your face or eyelid droops to one side.  You have sudden confusion.  You have trouble speaking (aphasia) or understanding.  You have sudden trouble seeing in one or both eyes.  You have sudden trouble walking.  You have dizziness.  You have a loss of balance or coordination.  You have a sudden, severe headache with no known cause.  You have new chest pain or an irregular heartbeat. Any of these symptoms may represent a serious problem that is an emergency. Do not wait to see if the symptoms will go away. Get medical help at once. Call your local emergency services (911 in [place] Do not drive yourself to the hospital. Document Released: 11/14/2004 Document Revised: 02/21/2014 Document Reviewed: 04/09/2013 [employer] Patient Information 2015 [employer]. This information is not intended to replace advice given   to you by your health care provider. Make sure you discuss any questions you have with your health care provider.  

## 2014-09-06 NOTE — Progress Notes (Signed)
Established Carotid Patient   History of Present Illness  Shannon Roberts is a 63 y.o. female patient of Dr. Donnetta Hutching who returns today for followup of her right carotid endarterectomy for symptomatic disease on 01/17/2014.  She had a stroke a couple of weeks before her right CEA as manifested by left hand worsening weakness, severe dizziness, left side of facial twitching; denies aphasia, denies monocular loss of vision, denies left arm or leg weakness. She has very mild residual left hand weakness.  She has anterior thigh pain after walking about 20 feet; she states she also has right sciatic pain. She denies non healing wounds.  The patient reports New Medical or Surgical History: saw cardiologist for a stress test, states the test was fine. She is scheduled for an endoscope in January, 2016 as it is suspected that she may have gastroparesis.  She states she is so tired all of the time.  Pt Diabetic: states she is borderline Pt smoker: former smoker, quit in March, 2015  Pt meds include: Statin : Yes ASA: No Other anticoagulants/antiplatelets: Plavix   Past Medical History  Diagnosis Date  . IBS (irritable bowel syndrome)   . Anxiety   . GERD (gastroesophageal reflux disease)   . Depression   . Diverticulosis   . Hyperlipemia   . Hypertension   . Mitral valve prolapse   . Colitis   . COPD (chronic obstructive pulmonary disease)     no o2  . Sciatic nerve disease   . Heart murmur   . Stroke   . Shortness of breath   . Anemia     per hosp. - recently- pt. told that she is anemic    Social History History  Substance Use Topics  . Smoking status: Former Smoker -- 1.00 packs/day for 40 years    Types: Cigarettes    Quit date: 01/02/2014  . Smokeless tobacco: Never Used  . Alcohol Use: No    Family History Family History  Problem Relation Age of Onset  . Colon cancer Paternal Aunt   . Breast cancer Mother   . Ovarian cancer Mother   . Pancreatic cancer  Maternal Grandmother   . Diabetes Maternal Aunt   . Heart disease Paternal Grandfather   . Kidney disease Maternal Aunt     Surgical History Past Surgical History  Procedure Laterality Date  . Cholecystectomy    . Endarterectomy Right 01/17/2014    Procedure: RIGHT CAROTID ARTERY ENDARTERECTOMY WITH DACRON PATCH ANGIOPLASTY;  Surgeon: Rosetta Posner, MD;  Location: [place] [facility];  Service: Vascular;  Laterality: Right;    Allergies  Allergen Reactions  . Dapsone     flushes  . Niacin And Related     flushes  . Amoxicillin Rash  . Carvedilol Palpitations    Current Outpatient Prescriptions  Medication Sig Dispense Refill  . acetaminophen (TYLENOL) 500 MG tablet Take 500-1,000 mg by mouth every 6 (six) hours as needed for mild pain.    Marland Kitchen albuterol-ipratropium (COMBIVENT) 18-103 MCG/ACT inhaler Inhale 1 puff into the lungs every 4 (four) hours.    . ALPRAZolam (XANAX) 0.25 MG tablet Take 0.25 mg by mouth 2 (two) times daily as needed for anxiety (anxiety). For sleep.    Marland Kitchen amLODipine (NORVASC) 10 MG tablet Take 10 mg by mouth daily.    Marland Kitchen atenolol (TENORMIN) 50 MG tablet Take 50 mg by mouth daily.    . clopidogrel (PLAVIX) 75 MG tablet Take 75 mg by mouth daily with breakfast.    .  DULoxetine (CYMBALTA) 60 MG capsule Take 60 mg by mouth daily.    . fluticasone (FLONASE) 50 MCG/ACT nasal spray Place 2 sprays into the nose every 6 (six) hours as needed for allergies.     . isosorbide mononitrate (IMDUR) 30 MG 24 hr tablet Take 30 mg by mouth 2 (two) times daily.    Marland Kitchen lisinopril (PRINIVIL,ZESTRIL) 20 MG tablet Take 1 tablet (20 mg total) by mouth daily. 30 tablet 1  . pantoprazole (PROTONIX) 40 MG tablet Take 40 mg by mouth 2 (two) times daily as needed. For stomach upset.    . simvastatin (ZOCOR) 10 MG tablet Take 1 tablet (10 mg total) by mouth at bedtime. 30 tablet 3  . traMADol (ULTRAM) 50 MG tablet Take 50 mg by mouth as needed.     No current facility-administered medications for this  visit.    Review of Systems : See HPI for pertinent positives and negatives.  Physical Examination  There were no vitals filed for this visit. There is no weight on file to calculate BMI.  General: WDWN female in NAD GAIT: normal Eyes: PERRLA Pulmonary:  Non-labored, CTAB, Negative  Rales, Negative rhonchi, & Negative wheezing.  Cardiac: regular Rhythm,  Negative detected murmur.  VASCULAR EXAM Carotid Bruits Right Left   Negative Negative    Aorta is not palpable. Radial pulses are 2+ palpable and equal.                                                                                                                            LE Pulses Right Left       FEMORAL  not palpable  1+ palpable        POPLITEAL  not palpable   not palpable       POSTERIOR TIBIAL  not palpable   not palpable        DORSALIS PEDIS      ANTERIOR TIBIAL 2+ palpable  2+ palpable     Gastrointestinal: soft, nontender, BS WNL, no r/g,  negative palpated masses.  Musculoskeletal: Negative muscle atrophy/wasting. M/S 5/5 throughout, Extremities without ischemic changes.  Neurologic: A&O X 3; Appropriate Affect ; SENSATION ;normal;  Speech is normal CN 2-12 intact, Pain and light touch intact in extremities, Motor exam as listed above. Straight leg raise test does not elicit pain in either leg.   Non-Invasive Vascular Imaging CEREBROVASCULAR DUPLEX EVALUATION    INDICATION: Carotid endarterectomy     PREVIOUS INTERVENTION(S): Right carotid endarterectomy on 01/17/14    DUPLEX EXAM:     RIGHT  LEFT  Peak Systolic Velocities (cm/s) End Diastolic Velocities (cm/s) Plaque LOCATION Peak Systolic Velocities (cm/s) End Diastolic Velocities (cm/s) Plaque  74 13  CCA PROXIMAL 76 18   87 16  CCA MID 82 21 HM  72 14  CCA DISTAL 93 29   91 18  ECA 264 52 HT  57 18  ICA PROXIMAL 115 33 HT  66 20  ICA MID 68 23   61 16  ICA DISTAL 52 15     Not Calculated ICA / CCA Ratio (PSV) 1.2  Antegrade  Vertebral Flow Antegrade  103 Brachial Systolic Pressure (mmHg) 128  Multiphasic (subclavian artery) Brachial Artery Waveforms Multiphasic (subclavian artery)    Plaque Morphology:  HM = Homogeneous, HT = Heterogeneous, CP = Calcific Plaque, SP = Smooth Plaque, IP = Irregular Plaque     ADDITIONAL FINDINGS: . No significant stenosis of the right external or bilateral common carotid arteries. . Left external carotid artery stenosis noted.    IMPRESSION: 1. Patent right carotid endarterectomy site with no right internal carotid artery stenosis. 2. Doppler velocities suggest a less than 40% stenosis of the left proximal internal carotid artery.    Compared to the previous exam:  No previous duplex exam at this facility available for comparison.        Assessment: BETZAYDA BRAXTON is a 63 y.o. female who presents with asymptomatic patent right carotid endarterectomy site with no right internal carotid artery stenosis and  less than 40% stenosis of the left proximal nternal carotid artery. Carotid stenosis at surgery was >80%.     Plan: Follow-up in 6 months with Carotid Duplex .   I discussed in depth with the patient the nature of atherosclerosis, and emphasized the importance of maximal medical management including strict control of blood pressure, blood glucose, and lipid levels, obtaining regular exercise, and continued cessation of smoking.  The patient is aware that without maximal medical management the underlying atherosclerotic disease process will progress, limiting the benefit of any interventions. The patient was given information about stroke prevention and what symptoms should prompt the patient to seek immediate medical care. Thank you for allowing [place] to participate in this patient's care.  Clemon Chambers, RN, MSN, FNP-C [facility] Office: [phone]  Clinic Physician: Early  09/06/2014 11:09 AM

## 2014-09-07 ENCOUNTER — Ambulatory Visit (INDEPENDENT_AMBULATORY_CARE_PROVIDER_SITE_OTHER): Payer: MEDICARE | Admitting: Nurse Practitioner

## 2014-09-07 ENCOUNTER — Encounter: Payer: Self-pay | Admitting: Nurse Practitioner

## 2014-09-07 VITALS — BP 114/71 | HR 75 | Ht 65.0 in | Wt 201.2 lb

## 2014-09-07 DIAGNOSIS — I639 Cerebral infarction, unspecified: Secondary | ICD-10-CM

## 2014-09-07 NOTE — Patient Instructions (Signed)
Patient was advised to continue Plavix for secondary stroke prevention and Zocor for hyperlipidemia and keep LDL cholesterol below 100 mg percent. Continue followup with Dr.Early for carotid surgery followup.  Return for followup in 6 months with Dr. Leonie Man or call  earlier if necessary.   Stroke Prevention Some medical conditions and behaviors are associated with an increased chance of having a stroke. You may prevent a stroke by making healthy choices and managing medical conditions. HOW CAN I REDUCE MY RISK OF HAVING A STROKE?   Stay physically active. Get at least 30 minutes of activity on most or all days.  Do not smoke. It may also be helpful to avoid exposure to secondhand smoke.  Limit alcohol use. Moderate alcohol use is considered to be:  No more than 2 drinks per day for men.  No more than 1 drink per day for nonpregnant women.  Eat healthy foods. This involves:  Eating 5 or more servings of fruits and vegetables a day.  Making dietary changes that address high blood pressure (hypertension), high cholesterol, diabetes, or obesity.  Manage your cholesterol levels.  Making food choices that are high in fiber and low in saturated fat, trans fat, and cholesterol may control cholesterol levels.  Take any prescribed medicines to control cholesterol as directed by your health care provider.  Manage your diabetes.  Controlling your carbohydrate and sugar intake is recommended to manage diabetes.  Take any prescribed medicines to control diabetes as directed by your health care provider.  Control your hypertension.  Making food choices that are low in salt (sodium), saturated fat, trans fat, and cholesterol is recommended to manage hypertension.  Take any prescribed medicines to control hypertension as directed by your health care provider.  Maintain a healthy weight.  Reducing calorie intake and making food choices that are low in sodium, saturated fat, trans fat, and  cholesterol are recommended to manage weight.  Stop drug abuse.  Avoid taking birth control pills.  Talk to your health care provider about the risks of taking birth control pills if you are over 83 years old, smoke, get migraines, or have ever had a blood clot.  Get evaluated for sleep disorders (sleep apnea).  Talk to your health care provider about getting a sleep evaluation if you snore a lot or have excessive sleepiness.  Take medicines only as directed by your health care provider.  For some people, aspirin or blood thinners (anticoagulants) are helpful in reducing the risk of forming abnormal blood clots that can lead to stroke. If you have the irregular heart rhythm of atrial fibrillation, you should be on a blood thinner unless there is a good reason you cannot take them.  Understand all your medicine instructions.  Make sure that other conditions (such as anemia or atherosclerosis) are addressed. SEEK IMMEDIATE MEDICAL CARE IF:   You have sudden weakness or numbness of the face, arm, or leg, especially on one side of the body.  Your face or eyelid droops to one side.  You have sudden confusion.  You have trouble speaking (aphasia) or understanding.  You have sudden trouble seeing in one or both eyes.  You have sudden trouble walking.  You have dizziness.  You have a loss of balance or coordination.  You have a sudden, severe headache with no known cause.  You have new chest pain or an irregular heartbeat. Any of these symptoms may represent a serious problem that is an emergency. Do not wait to see if the  symptoms will go away. Get medical help at once. Call your local emergency services (911 in [place] Do not drive yourself to the hospital. Document Released: 11/14/2004 Document Revised: 02/21/2014 Document Reviewed: 04/09/2013 [facility] Patient Information 2015 [place], [place]. This information is not intended to replace advice given to you by your health care  provider. Make sure you discuss any questions you have with your health care provider.

## 2014-09-07 NOTE — Progress Notes (Signed)
PATIENT: Shannon Roberts DOB: 12/25/1950  REASON FOR VISIT: routine follow up for stroke HISTORY FROM: patient  HISTORY OF PRESENT ILLNESS: 6 year Caucasian lady seen today for the first office follow visit for hospital admission for stroke. She presented on 01/02/14 with left hand weakness to urgent care. She was refer to [facility] for an [facility] which revealed an acute stroke in the right frontal watershed distribution.   Subsequent evaluation  By MRAshowed decrease caliber of the right internal carotid artery and decreased signal in the right A1 segment and MCA branches. Carotid ultrasound showed greater than 80% proximal right ICA stenosis. Her left hand weakness improved. Lipid profile showed total cholesterol 158, LDL 91 and HDL 47 mg percent. Hemoglobin A1c was 6.0. EKG and telemetry monitoring was normal sinus rhythm. 2DEcho showed normal   ejection fraction. Patient was started on aspirin for stroke prevention and Zocor for lipids and counselled to quit smoking. She  had elective right carotid endarterectomy a few weeks later by Dr. Donnetta Hutching and the surgery went well and she is doing great. She is no significant distal weakness and feels she starting Plavix well without significant bleeding or bruising. She is tolerating Zocor but does complain of mild muscle aches. She states her blood pressure is usually better controlled due to slightly elevated in office today. She is quit smoking is doing well but has unfortunately gained some weight. She has not been exercising regularly or monitoring her diet. She still has some diminished fine motor skills in the left hand but overall seems to be doing great.  Update 09/07/14 (LL): Since last visit, patient has not had any new neurovascular symptoms.  She had followup with Dr. Donnetta Hutching yesterday and had a good report. Blood pressure is well controlled, it is 114/71 in the office today. She is tolerating Plavix well with no signs of significant bleeding or  bruising. She has been having an increase in sciatic back pain. She is also having trouble with chronic burping; she was scheduled for diagnostic EGD in January. She admits that she is not exercising at all. She has not restarted smoking. She states she has gained 15-20 lbs since stopping smoking in March.  She has no residual numbness or weakness from her stroke.  REVIEW OF SYSTEMS: Full 14 system review of systems performed and notable only for: back pain  ALLERGIES: Allergies  Allergen Reactions  . Carvedilol Palpitations  . Dapsone     flushes  . Niacin And Related Other (See Comments)    flushes  . Amoxicillin Rash    HOME MEDICATIONS: Outpatient Prescriptions Prior to Visit  Medication Sig Dispense Refill  . acetaminophen (TYLENOL) 500 MG tablet Take 500-1,000 mg by mouth every 6 (six) hours as needed for mild pain.    Marland Kitchen albuterol-ipratropium (COMBIVENT) 18-103 MCG/ACT inhaler Inhale 1 puff into the lungs every 4 (four) hours.    . ALPRAZolam (XANAX) 0.25 MG tablet Take 0.25 mg by mouth 2 (two) times daily as needed for anxiety (anxiety). For sleep.    Marland Kitchen amLODipine (NORVASC) 10 MG tablet Take 10 mg by mouth daily.    Marland Kitchen atenolol (TENORMIN) 50 MG tablet Take 50 mg by mouth daily.    . clopidogrel (PLAVIX) 75 MG tablet Take 75 mg by mouth daily with breakfast.    . DULoxetine (CYMBALTA) 60 MG capsule Take 60 mg by mouth daily.    Marland Kitchen lisinopril (PRINIVIL,ZESTRIL) 20 MG tablet Take 1 tablet (20 mg total) by mouth  daily. 30 tablet 1  . pantoprazole (PROTONIX) 40 MG tablet Take 40 mg by mouth 2 (two) times daily as needed. For stomach upset.    . simethicone (MYLICON) 295 MG chewable tablet Chew 125 mg by mouth as needed for flatulence.    . simvastatin (ZOCOR) 10 MG tablet Take 1 tablet (10 mg total) by mouth at bedtime. 30 tablet 3  . traMADol (ULTRAM) 50 MG tablet Take 50 mg by mouth as needed.    . isosorbide mononitrate (IMDUR) 30 MG 24 hr tablet Take 30 mg by mouth 2 (two) times  daily.    . fluticasone (FLONASE) 50 MCG/ACT nasal spray Place 2 sprays into the nose every 6 (six) hours as needed for allergies.      No facility-administered medications prior to visit.    PHYSICAL EXAM Filed Vitals:   09/07/14 1554  BP: 114/71  Pulse: 75  Height: 5\' 5"  (1.651 m)  Weight: 201 lb 3.2 oz (91.264 kg)   Body mass index is 33.48 kg/(m^2).  Physical Exam General: well developed, well nourished, seated, in no evident distress Head: head normocephalic and atraumatic. Orohparynx benign Neck: supple with no carotid or supraclavicular bruits. Healed right carotid endarterectomy scar Cardiovascular: regular rate and rhythm, no murmurs Musculoskeletal: no deformity Skin:  no rash/petichiae Vascular:  Normal pulses all extremities  Neurologic Exam Mental Status: Awake and fully alert. Oriented to place and time. Recent and remote memory intact. Attention span, concentration and fund of knowledge appropriate. Mood and affect appropriate.   Cranial Nerves: Pupils equal, briskly reactive to light. Extraocular movements full without nystagmus. Visual fields full to confrontation. Hearing intact. Facial sensation intact. Face, tongue, palate moves normally and symmetrically.   Motor: Normal bulk and tone. Normal strength in all tested extremity muscles. Sensory: intact to touch and pinprick and vibratory sensation.   Coordination: Rapid alternating movements normal in all extremities. Finger-to-nose and heel-to-shin performed accurately bilaterally. Gait and Station: Arises from chair without difficulty. Stance is normal. Gait demonstrates normal stride length and balance . Able to heel, toe and tandem walk without difficulty.   Reflexes: 1+ and symmetric. Toes downgoing.   NIHSS  0 Modified Rankin  1   ASSESSMENT: 1 year Caucasian lady with a right brain watershed infarct symptomatic from greater than 80% proximal right ICA stenosis status post elective carotid  endarterectomy is doing clinically quite well. Vascular factors of carotid stenosis, previous smoking, hypertension, hyperlipidemia and mild obesity.  PLAN: Patient was advised to continue Plavix for secondary stroke prevention and Zocor for hyperlipidemia and keep LDL cholesterol below 100 mg percent. Continue followup with Dr.Early for carotid surgery followup.  I recommended that she start attending water exercises with her daughter or get in a routine of some other form of exercise to help with mood and core strength. Return for followup in 6 months with Dr. Leonie Man or call  earlier if necessary.  Rudi Rummage Velmer Broadfoot, MSN, FNP-BC, A/GNP-C 09/07/2014, 4:56 PM [facility] [address], [place], [place] [address] [phone]  Note: This document was prepared with digital dictation and possible smart phrase technology. Any transcriptional errors that result from this process are unintentional.

## 2014-09-10 NOTE — Progress Notes (Signed)
I agree with the above plan 

## 2014-09-19 ENCOUNTER — Other Ambulatory Visit: Payer: Self-pay

## 2014-09-19 ENCOUNTER — Telehealth: Payer: Self-pay | Admitting: Gastroenterology

## 2014-09-19 DIAGNOSIS — R1013 Epigastric pain: Secondary | ICD-10-CM

## 2014-09-19 NOTE — Telephone Encounter (Signed)
Scheduled gastric emptying scan

## 2014-09-19 NOTE — Telephone Encounter (Signed)
Also, she is worse when she lays down at night.

## 2014-09-19 NOTE — Telephone Encounter (Signed)
Patient reports no change in her symptoms, but no improvement. She was hoping for "something" for the constant full feeling and burping. She awakens feeling full and starts burping. Passing flatus gives her temporary relief. Her EGD is in January. Confirmed she takes Protonix BID. She is on Plavix. Denies any changes in bowel movements.

## 2014-09-20 ENCOUNTER — Other Ambulatory Visit: Payer: Self-pay

## 2014-09-20 DIAGNOSIS — R1013 Epigastric pain: Secondary | ICD-10-CM

## 2014-09-20 NOTE — Telephone Encounter (Signed)
Left her a message. The gastric emptying study is scheduled at [facility] 09/28/14, arrive at 6:45 NPO and no stomach medications should be taken.

## 2014-09-28 ENCOUNTER — Encounter (HOSPITAL_COMMUNITY)
Admission: RE | Admit: 2014-09-28 | Discharge: 2014-09-28 | Disposition: A | Discharge status: Home / Self Care | Payer: Medicare Other | Source: Ambulatory Visit | Attending: Gastroenterology | Admitting: Gastroenterology

## 2014-09-28 ENCOUNTER — Encounter: Payer: Self-pay | Admitting: Gastroenterology

## 2014-09-28 DIAGNOSIS — R1013 Epigastric pain: Secondary | ICD-10-CM | POA: Diagnosis present

## 2014-09-28 MED ORDER — TECHNETIUM TC 99M SULFUR COLLOID
2.0000 | Freq: Once | INTRAVENOUS | Status: AC | PRN
  Administered 2014-09-28: 2 via ORAL

## 2014-10-03 ENCOUNTER — Telehealth: Payer: Self-pay | Admitting: Gastroenterology

## 2014-10-04 ENCOUNTER — Encounter: Payer: Self-pay | Admitting: Gastroenterology

## 2014-10-04 NOTE — Telephone Encounter (Signed)
I have left message for the patient to call back 

## 2014-10-07 NOTE — Telephone Encounter (Signed)
Patient is asking for sleep aid. She states her burping makes it difficult to sleep. Discuss sleep hygiene. Encourage to not drink through straws, avoid carbonated drinks, gas causing foods and chewing gum. Also encouraged her to discuss sleep aid with her PCP to help re-establish normal sleep pattern.

## 2014-10-09 NOTE — Telephone Encounter (Signed)
Agree 

## 2014-10-10 ENCOUNTER — Ambulatory Visit: Payer: No Typology Code available for payment source | Admitting: Nurse Practitioner

## 2014-11-14 ENCOUNTER — Telehealth: Payer: Self-pay | Admitting: Gastroenterology

## 2014-11-14 NOTE — Telephone Encounter (Signed)
Confirmed with the patient what meds to take the morning of her endoscopy. She will take her normal morning medications with water.

## 2014-11-16 ENCOUNTER — Encounter: Payer: Self-pay | Admitting: Gastroenterology

## 2014-11-16 ENCOUNTER — Other Ambulatory Visit: Payer: Self-pay

## 2014-11-16 ENCOUNTER — Ambulatory Visit (AMBULATORY_SURGERY_CENTER): Payer: Medicare Other | Admitting: Gastroenterology

## 2014-11-16 ENCOUNTER — Telehealth: Payer: Self-pay | Admitting: Gastroenterology

## 2014-11-16 VITALS — BP 103/62 | HR 68 | Temp 97.7°F | Resp 15 | Ht 65.0 in | Wt 210.0 lb

## 2014-11-16 DIAGNOSIS — R142 Eructation: Secondary | ICD-10-CM

## 2014-11-16 DIAGNOSIS — R1013 Epigastric pain: Secondary | ICD-10-CM

## 2014-11-16 DIAGNOSIS — K30 Functional dyspepsia: Secondary | ICD-10-CM

## 2014-11-16 MED ORDER — LUBIPROSTONE 8 MCG PO CAPS: 8.0000 ug | ORAL_CAPSULE | Freq: Two times a day (BID) | ORAL | Status: DC

## 2014-11-16 MED ORDER — SODIUM CHLORIDE 0.9 % IV SOLN: 500.0000 mL | INTRAVENOUS | Status: DC

## 2014-11-16 NOTE — Op Note (Addendum)
[place]  [address] [place], [id number]   ENDOSCOPY PROCEDURE REPORT  PATIENT: Shannon, Roberts  MR#: [id number] BIRTHDATE: 08-Sep-1951 , 63  yrs. old GENDER: female ENDOSCOPIST: Inda Castle, MD REFERRED BY:  Melinda Crutch, M.D. PROCEDURE DATE:  11/16/2014 PROCEDURE:  EGD, diagnostic ASA CLASS:     Class II INDICATIONS:  dyspepsia. MEDICATIONS: Monitored anesthesia care and Propofol 160 mg IV TOPICAL ANESTHETIC:  DESCRIPTION OF PROCEDURE: After the risks benefits and alternatives of the procedure were thoroughly explained, informed consent was obtained.  The [id number] endoscope was introduced through the mouth and advanced to the second portion of the duodenum , Without limitations.  The instrument was slowly withdrawn as the mucosa was fully examined.      EXAM: The esophagus and gastroesophageal junction were completely normal in appearance.  The stomach was entered and closely examined.The antrum, angularis, and lesser curvature were well visualized, including a retroflexed view of the cardia and fundus. The stomach wall was normally distensable.  The scope passed easily through the pylorus into the duodenum.  Retroflexed views revealed no abnormalities.     The scope was then withdrawn from the patient and the procedure completed.  COMPLICATIONS: There were no immediate complications.  ENDOSCOPIC IMPRESSION: Normal appearing esophagus and GE junction, the stomach was well visualized and normal in appearance, normal appearing duodenum  RECOMMENDATIONS: Trial of amitiza OV 3-4 weeks.  REPEAT EXAM:  eSigned:  Inda Castle, MD 11/16/2014 9:39 AM Revised: 11/16/2014 9:39 AM   CC:  PATIENT NAME:  Shannon, Roberts MR#: [id number]

## 2014-11-16 NOTE — Patient Instructions (Addendum)
YOU HAD AN ENDOSCOPIC PROCEDURE TODAY AT [facility]: Refer to the procedure report that was given to you for any specific questions about what was found during the examination.  If the procedure report does not answer your questions, please call your gastroenterologist to clarify.  If you requested that your care partner not be given the details of your procedure findings, then the procedure report has been included in a sealed envelope for you to review at your convenience later.  YOU SHOULD EXPECT: Some feelings of bloating in the abdomen. Passage of more gas than usual.  Walking can help get rid of the air that was put into your GI tract during the procedure and reduce the bloating. If you had a lower endoscopy (such as a colonoscopy or flexible sigmoidoscopy) you may notice spotting of blood in your stool or on the toilet paper. If you underwent a bowel prep for your procedure, then you may not have a normal bowel movement for a few days.  DIET: Your first meal following the procedure should be a light meal and then it is ok to progress to your normal diet.  A half-sandwich or bowl of soup is an example of a good first meal.  Heavy or fried foods are harder to digest and may make you feel nauseous or bloated.  Likewise meals heavy in dairy and vegetables can cause extra gas to form and this can also increase the bloating.  Drink plenty of fluids but you should avoid alcoholic beverages for 24 hours.  ACTIVITY: Your care partner should take you home directly after the procedure.  You should plan to take it easy, moving slowly for the rest of the day.  You can resume normal activity the day after the procedure however you should NOT DRIVE or use heavy machinery for 24 hours (because of the sedation medicines used during the test).    SYMPTOMS TO REPORT IMMEDIATELY: A gastroenterologist can be reached at any hour.  During normal business hours, 8:30 AM to 5:00 PM Monday through Friday,  call [phone].  After hours and on weekends, please call the GI answering service at [phone] who will take a message and have the physician on call contact you.    Following upper endoscopy (EGD)  Vomiting of blood or coffee ground material  New chest pain or pain under the shoulder blades  Painful or persistently difficult swallowing  New shortness of breath  Fever of 100F or higher  Black, tarry-looking stools  FOLLOW UP: If any biopsies were taken you will be contacted by phone or by letter within the next 1-3 weeks.  Call your gastroenterologist if you have not heard about the biopsies in 3 weeks.  Our staff will call the home number listed on your records the next business day following your procedure to check on you and address any questions or concerns that you may have at that time regarding the information given to you following your procedure. This is a courtesy call and so if there is no answer at the home number and we have not heard from you through the emergency physician on call, we will assume that you have returned to your regular daily activities without incident.   SIGNATURES/CONFIDENTIALITY: You and/or your care partner have signed paperwork which will be entered into your electronic medical record.  These signatures attest to the fact that that the information above on your After Visit Summary has been reviewed and is understood.  Full responsibility of the confidentiality of this discharge information lies with you and/or your care-partner.  Trial of amitiza.  Office visit in 3-4 weeks, office will call to set up appointment.

## 2014-11-16 NOTE — Progress Notes (Signed)
A/ox3, pleased with MAC, report to RN 

## 2014-11-16 NOTE — Telephone Encounter (Signed)
Spoke with Genella Mech CMA, she states she just received notification from the pharmacy that pt needs prior authorization and she would not be able to fill it out until Friday, then the insurance has 48 hours to respond, so it would be next week sometimes before pt would be able to get prescription filled, called pt back and informed her of process, pt was not happy that she had to wait so long to get medication filled.-adm

## 2014-11-17 ENCOUNTER — Telehealth: Payer: Self-pay | Admitting: *Deleted

## 2014-11-17 NOTE — Telephone Encounter (Signed)
Called pt back and explained to her that the amitiza is to help with motility and hopefully help with the symptoms she is having, pt states understanding as was just concerned, advised pt that if she does not tolerate medication to call the office-adm

## 2014-11-17 NOTE — Telephone Encounter (Signed)
Low-dose Amitiza may be used for mild hypomotility.  She was prescribed the 8 g dose.  I suggest that she try it to see both if she tolerates it and if it helps her symptoms.

## 2014-11-17 NOTE — Telephone Encounter (Signed)
  Follow up Call-  Call back number 11/16/2014  Post procedure Call Back phone  # [phone]  Permission to leave phone message Yes     Patient questions:  Do you have a fever, pain , or abdominal swelling? No. Pain Score  0 *  Have you tolerated food without any problems? Yes.    Have you been able to return to your normal activities? Yes.    Do you have any questions about your discharge instructions: Diet   No. Medications  Yes.   Follow up visit  No.  Do you have questions or concerns about your Care? Yes.    Actions: * If pain score is 4 or above: Physician/ provider Notified : Erskine Emery, MD.  Pt is not comfortable with taking the amitiza because of the side effects it can cause and she states she is not constipated, pt is concerned about the "burping", she states she can not sleep at night, pt is on protonix. Is there anything else you want her to try before seeing you in 3-4 weeks?-adm

## 2014-11-18 ENCOUNTER — Encounter: Payer: Self-pay | Admitting: *Deleted

## 2014-11-18 ENCOUNTER — Telehealth: Payer: Self-pay | Admitting: *Deleted

## 2014-11-18 NOTE — Telephone Encounter (Signed)
=  View-only below this line===  ----- Message -----    From: Inda Castle, MD    Sent: 11/18/2014   9:06 AM      To: Oda Kilts, CMA Subject: RE: Baker Pierini                                    Low dose amitiza has promotility properties that may help with bloating ----- Message -----    From: Oda Kilts, CMA    Sent: 11/18/2014   9:00 AM      To: Inda Castle, MD Subject: Baker Pierini                                        Why did you prescribe this patient Amitiza?? Having to do a prior auth and I dont see constipation in her chart? Insurance will only pay for amitiza with a dx of constipation

## 2014-11-23 NOTE — Telephone Encounter (Signed)
Dr Jenne Pane, This authorization was denied.... Will not cover for the reasons you prescribed.    Please advise for the patient

## 2014-11-23 NOTE — Telephone Encounter (Signed)
Trial of Reglan 5 mg one half hour before meals and at bedtime Instruct patient to  contact me immediately  if she develops any side effects from her Reglan including paresthesias, tremors, confusion , weakness or muscle spasms. Call back to report progress in 7-10 days

## 2014-11-24 MED ORDER — METOCLOPRAMIDE HCL 5 MG PO TABS: 5.0000 mg | ORAL_TABLET | Freq: Three times a day (TID) | ORAL | Status: DC

## 2014-11-24 NOTE — Telephone Encounter (Signed)
Patient aware medication (Amitiza) not covered  Sent in reglan fopr pt  Patient aware

## 2014-11-25 ENCOUNTER — Telehealth: Payer: Self-pay | Admitting: Gastroenterology

## 2014-11-30 NOTE — Telephone Encounter (Signed)
Patient picked up prescription from pharmacy the pharmacy had to order more they gave her half of her prescription because they was out

## 2014-12-14 ENCOUNTER — Telehealth: Payer: Self-pay | Admitting: Gastroenterology

## 2014-12-14 NOTE — Telephone Encounter (Signed)
She has stopped the reglan. There was no improvement in her burping and gastric distention sensation. It was also making her very sleepy. Sometimes her burps feel like a relief of pressure. Sometimes they bring reflux. She is eating non-gas creating foods. She is not drinking sodas. She sleeps in a semi-recumbent position. She is unsure if she snores. She will discuss her issues with you at her 12/26/14 appointment.

## 2014-12-18 NOTE — Telephone Encounter (Signed)
ok 

## 2014-12-19 ENCOUNTER — Other Ambulatory Visit: Payer: Self-pay

## 2014-12-19 ENCOUNTER — Telehealth: Payer: Self-pay | Admitting: Gastroenterology

## 2014-12-19 NOTE — Telephone Encounter (Signed)
Agree with plan 

## 2014-12-19 NOTE — Telephone Encounter (Signed)
Watery diarrhea. Lots of gas. Bowel movement is explosive. No cramps. No fever or bloody stool. She feels fine otherwise. Recommended she alter diet today to liquids, rice,toast, potatoes,ect. and avoid dairy. She has an appointment on 12/26/14. Call back if she acutely worsens or fails to improve.

## 2014-12-26 ENCOUNTER — Other Ambulatory Visit (INDEPENDENT_AMBULATORY_CARE_PROVIDER_SITE_OTHER): Payer: Medicare Other

## 2014-12-26 ENCOUNTER — Encounter: Payer: Self-pay | Admitting: Gastroenterology

## 2014-12-26 ENCOUNTER — Ambulatory Visit (INDEPENDENT_AMBULATORY_CARE_PROVIDER_SITE_OTHER): Payer: Medicare Other | Admitting: Gastroenterology

## 2014-12-26 VITALS — BP 116/72 | HR 100 | Ht 65.0 in | Wt 200.2 lb

## 2014-12-26 DIAGNOSIS — R1013 Epigastric pain: Secondary | ICD-10-CM

## 2014-12-26 DIAGNOSIS — K30 Functional dyspepsia: Secondary | ICD-10-CM

## 2014-12-26 LAB — COMPREHENSIVE METABOLIC PANEL
ALK PHOS: 83 U/L (ref 39–117)
ALT: 10 U/L (ref 0–35)
AST: 14 U/L (ref 0–37)
Albumin: 4.4 g/dL (ref 3.5–5.2)
BUN: 13 mg/dL (ref 6–23)
CALCIUM: 9.9 mg/dL (ref 8.4–10.5)
CHLORIDE: 101 meq/L (ref 96–112)
CO2: 25 mEq/L (ref 19–32)
Creatinine, Ser: 1.15 mg/dL (ref 0.40–1.20)
GFR: 50.32 mL/min — ABNORMAL LOW (ref >60.00)
GLUCOSE: 220 mg/dL — AB (ref 70–99)
POTASSIUM: 4.4 meq/L (ref 3.5–5.1)
SODIUM: 134 meq/L — AB (ref 135–145)
TOTAL PROTEIN: 8.1 g/dL (ref 6.0–8.3)
Total Bilirubin: 0.4 mg/dL (ref 0.2–1.2)

## 2014-12-26 LAB — CBC WITH DIFFERENTIAL/PLATELET
Basophils Absolute: 0.1 10*3/uL (ref 0.0–0.1)
Basophils Relative: 1.5 % (ref 0.0–3.0)
EOS ABS: 0.3 10*3/uL (ref 0.0–0.7)
EOS PCT: 4.1 % (ref 0.0–5.0)
HCT: 36.6 % (ref 36.0–46.0)
Hemoglobin: 12.5 g/dL (ref 12.0–15.0)
Lymphocytes Relative: 20.2 % (ref 12.0–46.0)
Lymphs Abs: 1.6 10*3/uL (ref 0.7–4.0)
MCHC: 34 g/dL (ref 30.0–36.0)
MCV: 88.4 fl (ref 78.0–100.0)
MONOS PCT: 5.6 % (ref 3.0–12.0)
Monocytes Absolute: 0.4 10*3/uL (ref 0.1–1.0)
Neutro Abs: 5.5 10*3/uL (ref 1.4–7.7)
Neutrophils Relative %: 68.6 % (ref 43.0–77.0)
PLATELETS: 221 10*3/uL (ref 150.0–400.0)
RBC: 4.14 Mil/uL (ref 3.87–5.11)
RDW: 13.4 % (ref 11.5–15.5)
WBC: 7.9 10*3/uL (ref 4.0–10.5)

## 2014-12-26 NOTE — Assessment & Plan Note (Signed)
Four-month history of dyspepsia consisting of excess gas, belching and distention.  Workup including upper endoscopy and gastric emptying scan were negative.  Occult malignancy should be considered although symptoms are very non-specific.    Recommendations #1 check CBC, C met #2 CT of the abdomen and pelvis

## 2014-12-26 NOTE — Progress Notes (Signed)
      History of Present Illness:  Ms. Cremeens she needs to complain of bloating, excess gas and belching.  Upper endoscopy and gastric empty scan were unrevealing.  Last colonoscopy in 2012 was normal.  Batista and Reglan were not helpful.  Over the past week she had watery diarrhea but this has subsided.  Symptoms have been ongoing for 3-4 months.  There is been no change in medications.  Weight has unchanged.    Review of Systems: Pertinent positive and negative review of systems were noted in the above HPI section. All other review of systems were otherwise negative.    Current Medications, Allergies, Past Medical History, Past Surgical History, Family History and Social History were reviewed in [facility] record  Vital signs were reviewed in today's medical record. Physical Exam: General: Well developed , well nourished, no acute distress Skin: anicteric Head: Normocephalic and atraumatic Eyes:  sclerae anicteric, EOMI Ears: Normal auditory acuity Mouth: No deformity or lesions Lungs: Clear throughout to auscultation Heart: Regular rate and rhythm; no murmurs, rubs or bruits Abdomen: Soft, non tender and non distended. No masses, hepatosplenomegaly or hernias noted. Normal Bowel sounds Rectal:deferred Musculoskeletal: Symmetrical with no gross deformities  Pulses:  Normal pulses noted Extremities: No clubbing, cyanosis, edema or deformities noted Neurological: Alert oriented x 4, grossly nonfocal Psychological:  Alert and cooperative. Normal mood and affect  See Assessment and Plan under Problem List

## 2014-12-26 NOTE — Patient Instructions (Signed)
You have been scheduled for a CT scan of the abdomen and pelvis at [facility] ([address]---this is in the same building as [facility]).   You are scheduled on 12/28/2014 at 2:30pm. You should arrive 15 minutes prior to your appointment time for registration. Please follow the written instructions below on the day of your exam:  WARNING: IF YOU ARE ALLERGIC TO IODINE/X-RAY DYE, PLEASE NOTIFY RADIOLOGY IMMEDIATELY AT [phone]! YOU WILL BE GIVEN A 13 HOUR PREMEDICATION PREP.  1) Do not eat or drink anything after 10:30am (4 hours prior to your test) 2) You have been given 2 bottles of oral contrast to drink. The solution may taste               better if refrigerated, but do NOT add ice or any other liquid to this solution. Shake             well before drinking.    Drink 1 bottle of contrast @ 12:30pm (2 hours prior to your exam)  Drink 1 bottle of contrast @ 1:30pm (1 hour prior to your exam)  You may take any medications as prescribed with a small amount of water except for the following: Metformin, Glucophage, Glucovance, Avandamet, Riomet, Fortamet, Actoplus Met, Janumet, Glumetza or Metaglip. The above medications must be held the day of the exam AND 48 hours after the exam.  The purpose of you drinking the oral contrast is to aid in the visualization of your intestinal tract. The contrast solution may cause some diarrhea. Before your exam is started, you will be given a small amount of fluid to drink. Depending on your individual set of symptoms, you may also receive an intravenous injection of x-ray contrast/dye. Plan on being at [facility] for 30 minutes or long, depending on the type of exam you are having performed.  This test typically takes 30-45 minutes to complete.  If you have any questions regarding your exam or if you need to reschedule, you may call the CT department at [phone] between the hours of 8:00 am and 5:00 pm,  Monday-Friday.  ________________________________________________________________________    Go to the basement for labs today

## 2014-12-28 ENCOUNTER — Inpatient Hospital Stay: Admission: RE | Admit: 2014-12-28 | Payer: Medicare Other | Source: Ambulatory Visit

## 2014-12-28 ENCOUNTER — Encounter: Payer: Self-pay | Admitting: Gastroenterology

## 2015-01-03 ENCOUNTER — Ambulatory Visit (INDEPENDENT_AMBULATORY_CARE_PROVIDER_SITE_OTHER)
Admission: RE | Admit: 2015-01-03 | Discharge: 2015-01-03 | Disposition: A | Discharge status: Home / Self Care | Payer: Medicare Other | Source: Ambulatory Visit | Attending: Gastroenterology | Admitting: Gastroenterology

## 2015-01-03 DIAGNOSIS — R1013 Epigastric pain: Secondary | ICD-10-CM

## 2015-01-03 MED ORDER — IOHEXOL 300 MG/ML  SOLN
100.0000 mL | Freq: Once | INTRAMUSCULAR | Status: AC | PRN
  Administered 2015-01-03: 100 mL via INTRAVENOUS

## 2015-01-04 ENCOUNTER — Telehealth: Payer: Self-pay | Admitting: Gastroenterology

## 2015-01-04 NOTE — Telephone Encounter (Signed)
Patient had labs done by another doctor. She has seen them in "My Chart" and is very concerned about her glucose. I encouraged her to contact her PCP for an appointment to discuss.

## 2015-01-05 ENCOUNTER — Other Ambulatory Visit: Payer: Self-pay

## 2015-01-05 DIAGNOSIS — N83201 Unspecified ovarian cyst, right side: Secondary | ICD-10-CM

## 2015-01-06 ENCOUNTER — Telehealth: Payer: Self-pay | Admitting: Gastroenterology

## 2015-01-06 NOTE — Telephone Encounter (Signed)
Confirmed the appointment with [facility] on 01/23/15 at 12:00. Arrive at 11:45 am and bring current insurance card.

## 2015-01-15 ENCOUNTER — Encounter: Payer: Self-pay | Admitting: Gastroenterology

## 2015-01-16 ENCOUNTER — Encounter: Payer: Self-pay | Admitting: Gastroenterology

## 2015-01-17 ENCOUNTER — Telehealth: Payer: Self-pay | Admitting: Gastroenterology

## 2015-01-18 NOTE — Telephone Encounter (Signed)
Records faxed again to a new phone number at [facility] [phone]). Scheduler says she will call the patient and get her scheduled. The patient is aware.

## 2015-01-23 ENCOUNTER — Ambulatory Visit: Payer: No Typology Code available for payment source | Admitting: Gynecology

## 2015-01-24 ENCOUNTER — Other Ambulatory Visit: Payer: Self-pay | Admitting: Obstetrics and Gynecology

## 2015-01-24 DIAGNOSIS — R1900 Intra-abdominal and pelvic swelling, mass and lump, unspecified site: Secondary | ICD-10-CM

## 2015-01-24 DIAGNOSIS — R1903 Right lower quadrant abdominal swelling, mass and lump: Secondary | ICD-10-CM

## 2015-02-06 ENCOUNTER — Telehealth: Payer: Self-pay | Admitting: *Deleted

## 2015-02-06 NOTE — Telephone Encounter (Signed)
Called and left a message for the pt explaining that Dr. Leonie Man would not be in the office on 03/14/15 and asked her to call back and reschedule. When the pt calls back, please reschedule her appt

## 2015-02-14 ENCOUNTER — Ambulatory Visit
Admission: RE | Admit: 2015-02-14 | Discharge: 2015-02-14 | Disposition: A | Discharge status: Home / Self Care | Payer: Medicare Other | Source: Ambulatory Visit | Attending: Obstetrics and Gynecology | Admitting: Obstetrics and Gynecology

## 2015-02-14 DIAGNOSIS — R1900 Intra-abdominal and pelvic swelling, mass and lump, unspecified site: Secondary | ICD-10-CM

## 2015-02-14 DIAGNOSIS — R1903 Right lower quadrant abdominal swelling, mass and lump: Secondary | ICD-10-CM

## 2015-02-14 MED ORDER — GADOBENATE DIMEGLUMINE 529 MG/ML IV SOLN
18.0000 mL | Freq: Once | INTRAVENOUS | Status: AC | PRN
  Administered 2015-02-14: 18 mL via INTRAVENOUS

## 2015-03-07 ENCOUNTER — Telehealth: Payer: Self-pay | Admitting: Gastroenterology

## 2015-03-07 NOTE — Telephone Encounter (Signed)
Patient has had her work up with GYN. They will do a follow up MRI in 4 months. She continues to have the "burping". She has had laryngitis twice since last seen here. She is concerned there is a connection. Appointment scheduled for re-evaluation.

## 2015-03-08 ENCOUNTER — Ambulatory Visit: Payer: Medicare Other | Admitting: Gastroenterology

## 2015-03-08 ENCOUNTER — Telehealth: Payer: Self-pay | Admitting: Gastroenterology

## 2015-03-08 NOTE — Telephone Encounter (Signed)
Appointment rescheduled for her and she is notified.

## 2015-03-13 ENCOUNTER — Ambulatory Visit (INDEPENDENT_AMBULATORY_CARE_PROVIDER_SITE_OTHER): Payer: Medicare Other | Admitting: Gastroenterology

## 2015-03-13 ENCOUNTER — Encounter: Payer: Self-pay | Admitting: Gastroenterology

## 2015-03-13 VITALS — BP 118/70 | HR 76 | Ht 65.0 in | Wt 195.6 lb

## 2015-03-13 DIAGNOSIS — R142 Eructation: Secondary | ICD-10-CM | POA: Insufficient documentation

## 2015-03-13 NOTE — Assessment & Plan Note (Signed)
Belching is supragastric and related to repeated aerophagia which I have observed.  This no doubt reflects an anxiety state.  I explained this to the patient.  I will refer her to speech pathology who may sometimes be helpful in biofeedback and and teaching the patient to avoid this behavior.

## 2015-03-13 NOTE — Patient Instructions (Signed)
We will contact you with your appointment to Speech Therapy-Rehab once the appointment becomes available

## 2015-03-13 NOTE — Progress Notes (Signed)
      History of Present Illness:  Ms. Grajales and 9 use to complain of excess belching.  She feels that gas buildup in her chest.  She does not belch when sleeping.  CT scan showed an incidental ovarian cyst.  MRI confirmed the cystic nature of the mass.  It was decided not to pursue this any further.    Review of Systems: Pertinent positive and negative review of systems were noted in the above HPI section. All other review of systems were otherwise negative.    Current Medications, Allergies, Past Medical History, Past Surgical History, Family History and Social History were reviewed in [facility] record  Vital signs were reviewed in today's medical record. Physical Exam: General: Well developed , well nourished, no acute distress with his swallowing air as I observe her except when she is talking.   See Assessment and Plan under Problem List

## 2015-03-14 ENCOUNTER — Ambulatory Visit: Payer: MEDICARE | Admitting: Neurology

## 2015-03-15 ENCOUNTER — Telehealth: Payer: Self-pay | Admitting: *Deleted

## 2015-03-15 NOTE — Telephone Encounter (Signed)
PER DR KAPLAN SCHEDULE REFERRAL FOR SPEECH CONSULT FOR AREOPHAGIA  NOT [facility]-   BUT SPEECH CONSULT- PER SPEECH PATH THEY TOLD ME TO CONTACT NEURO REHAB THEY TOLD ME HOW TO PUT THE ORDERS IN   PER PATIENT SHE DOES NOT KNOW IF SHE WILL GO TO THIS APPOINTMENT ONCE SCHEDULED

## 2015-03-16 ENCOUNTER — Other Ambulatory Visit (HOSPITAL_COMMUNITY): Payer: Medicare Other

## 2015-03-16 ENCOUNTER — Ambulatory Visit: Payer: Medicare Other | Admitting: Family

## 2015-03-17 ENCOUNTER — Encounter: Payer: Self-pay | Admitting: Vascular Surgery

## 2015-03-21 ENCOUNTER — Ambulatory Visit (HOSPITAL_COMMUNITY)
Admission: RE | Admit: 2015-03-21 | Discharge: 2015-03-21 | Disposition: A | Discharge status: Home / Self Care | Payer: Medicare Other | Source: Ambulatory Visit | Attending: Vascular Surgery | Admitting: Vascular Surgery

## 2015-03-21 ENCOUNTER — Encounter: Payer: Self-pay | Admitting: Vascular Surgery

## 2015-03-21 ENCOUNTER — Ambulatory Visit (INDEPENDENT_AMBULATORY_CARE_PROVIDER_SITE_OTHER): Payer: Medicare Other | Admitting: Vascular Surgery

## 2015-03-21 ENCOUNTER — Ambulatory Visit: Payer: Medicare Other | Admitting: Family

## 2015-03-21 ENCOUNTER — Other Ambulatory Visit (HOSPITAL_COMMUNITY): Payer: Medicare Other

## 2015-03-21 VITALS — BP 115/65 | HR 74 | Ht 65.0 in | Wt 196.6 lb

## 2015-03-21 DIAGNOSIS — I6523 Occlusion and stenosis of bilateral carotid arteries: Secondary | ICD-10-CM

## 2015-03-21 NOTE — Progress Notes (Signed)
Here today for follow-up of right carotid endarterectomy for symptomatic disease on 12/2013. She's had no neurologic deficits since surgery. She reports she has had multiple issues since October. Initially she began having hiccups. Evaluation of this with pulmonary cardiac and GI showed no specific etiology according to the patient. She then underwent a CT of her abdomen showing ovarian cyst which is felt to be benign. She is frustrated that the multiple workups required for these new diagnoses.  Past Medical History  Diagnosis Date  . IBS (irritable bowel syndrome)   . Anxiety   . GERD (gastroesophageal reflux disease)   . Depression   . Diverticulosis   . Hyperlipemia   . Hypertension   . Mitral valve prolapse   . Colitis   . COPD (chronic obstructive pulmonary disease)     no o2  . Sciatic nerve disease   . Heart murmur   . Shortness of breath   . Anemia     per hosp. - recently- pt. told that she is anemic  . Stroke March 2015  . Peripheral vascular disease     History  Substance Use Topics  . Smoking status: Former Smoker -- 1.00 packs/day for 40 years    Types: Cigarettes    Quit date: 01/02/2014  . Smokeless tobacco: Never Used  . Alcohol Use: No    Family History  Problem Relation Age of Onset  . Colon cancer Paternal Aunt   . Breast cancer Mother   . Ovarian cancer Mother   . Cancer Mother   . AAA (abdominal aortic aneurysm) Mother   . Pancreatic cancer Maternal Grandmother   . Diabetes Maternal Aunt   . Heart disease Paternal Grandfather   . Kidney disease Maternal Aunt   . Cancer Father     Allergies  Allergen Reactions  . Carvedilol Palpitations  . Dapsone     flushes  . Niacin And Related Other (See Comments)    flushes  . Amoxicillin Rash     Current outpatient prescriptions:  .  acetaminophen (TYLENOL) 500 MG tablet, Take 500-1,000 mg by mouth every 6 (six) hours as needed for mild pain., Disp: , Rfl:  .  albuterol-ipratropium (COMBIVENT)  18-103 MCG/ACT inhaler, Inhale 1 puff into the lungs every 4 (four) hours., Disp: , Rfl:  .  amLODipine (NORVASC) 10 MG tablet, Take 10 mg by mouth daily., Disp: , Rfl:  .  atenolol (TENORMIN) 50 MG tablet, Take 50 mg by mouth daily., Disp: , Rfl:  .  clopidogrel (PLAVIX) 75 MG tablet, Take 75 mg by mouth daily with breakfast., Disp: , Rfl:  .  DULoxetine (CYMBALTA) 60 MG capsule, Take 60 mg by mouth daily., Disp: , Rfl:  .  isosorbide mononitrate (IMDUR) 30 MG 24 hr tablet, Take 30 mg by mouth 2 (two) times daily., Disp: , Rfl: 0 .  lisinopril (PRINIVIL,ZESTRIL) 20 MG tablet, Take 1 tablet (20 mg total) by mouth daily., Disp: 30 tablet, Rfl: 1 .  LORazepam (ATIVAN) 0.5 MG tablet, Take 0.5 mg by mouth at bedtime., Disp: , Rfl: 0 .  pantoprazole (PROTONIX) 40 MG tablet, Take 40 mg by mouth 2 (two) times daily as needed. For stomach upset., Disp: , Rfl:  .  simvastatin (ZOCOR) 10 MG tablet, Take 1 tablet (10 mg total) by mouth at bedtime., Disp: 30 tablet, Rfl: 3 .  traMADol (ULTRAM) 50 MG tablet, Take 50 mg by mouth as needed., Disp: , Rfl:  .  Vitamin D, Ergocalciferol, (DRISDOL) 50000 UNITS  CAPS capsule, Take 50,000 Units by mouth once a week., Disp: , Rfl: 0 .  ALPRAZolam (XANAX) 0.25 MG tablet, Take 0.25 mg by mouth 2 (two) times daily as needed for anxiety (anxiety). For sleep., Disp: , Rfl:   Filed Vitals:   03/21/15 1115 03/21/15 1116  BP: 121/75 115/65  Pulse: 74   Height: 5\' 5"  (1.651 m)   Weight: 196 lb 9.6 oz (89.177 kg)   SpO2: 100%     Body mass index is 32.72 kg/(m^2).   On physical exam her right carotid artery incision is well-healed on the right and she has no bruits bilaterally 2+ radial pulses bilaterally Grossly intact neurologically Heart regular rate and rhythm Chest clear with equal breath sounds bilaterally and no wheezes noted  Carotid duplex today was reviewed and discussed with the patient. This reveals widely patent endarterectomy site on the right and no  evidence of significant internal carotid artery stenosis on the left.  Impression and plan: Stable follow-up after carotid endarterectomy for severe symptomatic stenosis on the right. She will continue her usual activities. We will see her in one year with repeat duplex. She will receive duplex and follow up with our nurse practitioner.

## 2015-03-27 ENCOUNTER — Ambulatory Visit (INDEPENDENT_AMBULATORY_CARE_PROVIDER_SITE_OTHER): Payer: Medicare Other | Admitting: Neurology

## 2015-03-27 ENCOUNTER — Encounter: Payer: Self-pay | Admitting: Neurology

## 2015-03-27 VITALS — BP 110/80 | HR 70 | Resp 16

## 2015-03-27 DIAGNOSIS — I6990 Unspecified sequelae of unspecified cerebrovascular disease: Secondary | ICD-10-CM

## 2015-03-27 NOTE — Patient Instructions (Signed)
I had a long d/w patient about her remote stroke, risk for recurrent stroke/TIAs, personally independently reviewed imaging studies and stroke evaluation results and answered questions.Continue Plavix  for secondary stroke prevention and maintain strict control of hypertension with blood pressure goal below 130/90, diabetes with hemoglobin A1c goal below 6.5% and lipids with LDL cholesterol goal below 100 mg/dL. I also advised the patient to eat a healthy diet with plenty of whole grains, cereals, fruits and vegetables, exercise regularly and maintain ideal body weight .check yearly carotid ultrasound study and vascular surgery office. Followup in the future with me in one year or call earlier if necessary.

## 2015-03-27 NOTE — Progress Notes (Signed)
PATIENT: Shannon Roberts DOB: [birth year]  REASON FOR VISIT: routine follow up for stroke HISTORY FROM: patient  HISTORY OF PRESENT ILLNESS: 68 year Caucasian lady seen today for the first office follow visit for hospital admission for stroke. She presented on 01/02/14 with left hand weakness to urgent care. She was refer to [facility] for an [facility] which revealed an acute stroke in the right frontal watershed distribution.   Subsequent evaluation  By MRAshowed decrease caliber of the right internal carotid artery and decreased signal in the right A1 segment and MCA branches. Carotid ultrasound showed greater than 80% proximal right ICA stenosis. Her left hand weakness improved. Lipid profile showed total cholesterol 158, LDL 91 and HDL 47 mg percent. Hemoglobin A1c was 6.0. EKG and telemetry monitoring was normal sinus rhythm. 2DEcho showed normal   ejection fraction. Patient was started on aspirin for stroke prevention and Zocor for lipids and counselled to quit smoking. She  had elective right carotid endarterectomy a few weeks later by Dr. Donnetta Hutching and the surgery went well and she is doing great. She is no significant distal weakness and feels she starting Plavix well without significant bleeding or bruising. She is tolerating Zocor but does complain of mild muscle aches. She states her blood pressure is usually better controlled due to slightly elevated in office today. She is quit smoking is doing well but has unfortunately gained some weight. She has not been exercising regularly or monitoring her diet. She still has some diminished fine motor skills in the left hand but overall seems to be doing great.  Update 09/07/14 (LL): Since last visit, patient has not had any new neurovascular symptoms.  She had followup with Dr. Donnetta Hutching yesterday and had a good report. Blood pressure is well controlled, it is 114/71 in the office today. She is tolerating Plavix well with no signs of significant bleeding or  bruising. She has been having an increase in sciatic back pain. She is also having trouble with chronic burping; she was scheduled for diagnostic EGD in January. She admits that she is not exercising at all. She has not restarted smoking. She states she has gained 15-20 lbs since stopping smoking in March.  She has no residual numbness or weakness from her stroke. Update 03/27/2015 : She returns for follow-up after last visit 6 months ago. She continues to do well from neurovascular standpoint without recurrent stroke or TIA symptoms. She is tolerating Plavix well without bleeding, bruising or other side effects. She states her blood pressure control is quite good and today it is 110/80 in office today. She has an she had a follow-up visit with vascular surgeon Dr. Donnetta Hutching last Tuesday and carotid ultrasound looks good. She had lipid profile checked a month ago by primary physician and it was fine. She has been diagnosed with a 4 cm ovarian cyst which is likely benign and is being followed conservatively. Her last hemoglobin A1c was 6.1. REVIEW OF SYSTEMS: Full 14 system review of systems performed and notable only for: 40, activity change, shortness of breath, insomnia, daytime sleepiness, back pain, aching muscles, walking difficulty, depression, nervousness and anxiety and all other systems negative.  ALLERGIES: Allergies  Allergen Reactions  . Carvedilol Palpitations  . Dapsone     flushes  . Niacin And Related Other (See Comments)    flushes  . Amoxicillin Rash    HOME MEDICATIONS: Outpatient Prescriptions Prior to Visit  Medication Sig Dispense Refill  . acetaminophen (TYLENOL) 500 MG tablet  Take 500-1,000 mg by mouth every 6 (six) hours as needed for mild pain.    Marland Kitchen albuterol-ipratropium (COMBIVENT) 18-103 MCG/ACT inhaler Inhale 1 puff into the lungs every 4 (four) hours.    Marland Kitchen amLODipine (NORVASC) 10 MG tablet Take 10 mg by mouth daily.    Marland Kitchen atenolol (TENORMIN) 50 MG tablet Take 50 mg by  mouth daily.    . clopidogrel (PLAVIX) 75 MG tablet Take 75 mg by mouth daily with breakfast.    . DULoxetine (CYMBALTA) 60 MG capsule Take 60 mg by mouth daily.    . isosorbide mononitrate (IMDUR) 30 MG 24 hr tablet Take 30 mg by mouth 2 (two) times daily.  0  . lisinopril (PRINIVIL,ZESTRIL) 20 MG tablet Take 1 tablet (20 mg total) by mouth daily. 30 tablet 1  . LORazepam (ATIVAN) 0.5 MG tablet Take 0.5 mg by mouth at bedtime.  0  . pantoprazole (PROTONIX) 40 MG tablet Take 40 mg by mouth 2 (two) times daily as needed. For stomach upset.    . simvastatin (ZOCOR) 10 MG tablet Take 1 tablet (10 mg total) by mouth at bedtime. 30 tablet 3  . traMADol (ULTRAM) 50 MG tablet Take 50 mg by mouth as needed.    . Vitamin D, Ergocalciferol, (DRISDOL) 50000 UNITS CAPS capsule Take 50,000 Units by mouth once a week.  0  . ALPRAZolam (XANAX) 0.25 MG tablet Take 0.25 mg by mouth 2 (two) times daily as needed for anxiety (anxiety). For sleep.     No facility-administered medications prior to visit.    PHYSICAL EXAM Filed Vitals:   03/27/15 1429  BP: 110/80  Pulse: 70  Resp: 16   There is no weight on file to calculate BMI.  Physical Exam General: well developed, well nourished, seated, in no evident distress Head: head normocephalic and atraumatic. Orohparynx benign Neck: supple with no carotid or supraclavicular bruits. Healed right carotid endarterectomy scar Cardiovascular: regular rate and rhythm, no murmurs Musculoskeletal: no deformity Skin:  no rash/petichiae Vascular:  Normal pulses all extremities  Neurologic Exam Mental Status: Awake and fully alert. Oriented to place and time. Recent and remote memory intact. Attention span, concentration and fund of knowledge appropriate. Mood and affect appropriate.   Cranial Nerves: Pupils equal, briskly reactive to light. Extraocular movements full without nystagmus. Visual fields full to confrontation. Hearing intact. Facial sensation intact.  Face, tongue, palate moves normally and symmetrically.   Motor: Normal bulk and tone. Normal strength in all tested extremity muscles. Sensory: intact to touch and pinprick and vibratory sensation.   Coordination: Rapid alternating movements normal in all extremities. Finger-to-nose and heel-to-shin performed accurately bilaterally. Gait and Station: Arises from chair without difficulty. Stance is normal. Gait demonstrates normal stride length and balance . Able to heel, toe and tandem walk with slight difficulty.   Reflexes: 1+ and symmetric. Toes downgoing.       ASSESSMENT: 2 year Caucasian lady with a right brain watershed infarct symptomatic from greater than 80% proximal right ICA stenosis status post elective carotid endarterectomy is doing clinically quite well. Vascular factors of carotid stenosis, previous smoking, hypertension, hyperlipidemia and mild obesity.  PLAN:  I had a long d/w patient about her remote stroke, risk for recurrent stroke/TIAs, personally independently reviewed imaging studies and stroke evaluation results and answered questions.Continue Plavix  for secondary stroke prevention and maintain strict control of hypertension with blood pressure goal below 130/90, diabetes with hemoglobin A1c goal below 6.5% and lipids with LDL cholesterol goal below 100  mg/dL. I also advised the patient to eat a healthy diet with plenty of whole grains, cereals, fruits and vegetables, exercise regularly and maintain ideal body weight .check yearly carotid ultrasound study and vascular surgery office. Followup in the future with me in one year or call earlier if necessary.   Antony Contras, MD  03/27/2015, 2:38 PM [facility] [address], [place], [place] [address] [phone]  Note: This document was prepared with digital dictation and possible smart phrase technology. Any transcriptional errors that result from this process are unintentional.

## 2015-04-07 NOTE — Telephone Encounter (Signed)
Checked on referral. Still in their work q

## 2015-04-17 ENCOUNTER — Other Ambulatory Visit: Payer: Self-pay

## 2015-04-27 NOTE — Telephone Encounter (Signed)
Looks like they have tried to contact patient, but patient has not returned call

## 2015-09-27 ENCOUNTER — Telehealth: Payer: Self-pay | Admitting: Gastroenterology

## 2015-09-27 NOTE — Telephone Encounter (Signed)
Confirmed with Dr [facility] nurse the notes received are in response to our referral to them. Records sent to be reviewed by Dr Silverio Decamp.

## 2016-01-08 DIAGNOSIS — R1903 Right lower quadrant abdominal swelling, mass and lump: Secondary | ICD-10-CM | POA: Diagnosis not present

## 2016-01-08 DIAGNOSIS — N8111 Cystocele, midline: Secondary | ICD-10-CM | POA: Diagnosis not present

## 2016-01-31 DIAGNOSIS — Z23 Encounter for immunization: Secondary | ICD-10-CM | POA: Diagnosis not present

## 2016-01-31 DIAGNOSIS — E559 Vitamin D deficiency, unspecified: Secondary | ICD-10-CM | POA: Diagnosis not present

## 2016-01-31 DIAGNOSIS — F419 Anxiety disorder, unspecified: Secondary | ICD-10-CM | POA: Diagnosis not present

## 2016-01-31 DIAGNOSIS — M5430 Sciatica, unspecified side: Secondary | ICD-10-CM | POA: Diagnosis not present

## 2016-01-31 DIAGNOSIS — I10 Essential (primary) hypertension: Secondary | ICD-10-CM | POA: Diagnosis not present

## 2016-01-31 DIAGNOSIS — E119 Type 2 diabetes mellitus without complications: Secondary | ICD-10-CM | POA: Diagnosis not present

## 2016-01-31 DIAGNOSIS — E782 Mixed hyperlipidemia: Secondary | ICD-10-CM | POA: Diagnosis not present

## 2016-03-26 ENCOUNTER — Ambulatory Visit: Payer: Medicare Other | Admitting: Neurology

## 2016-03-26 ENCOUNTER — Ambulatory Visit: Payer: Medicare Other | Admitting: Vascular Surgery

## 2016-03-28 ENCOUNTER — Encounter: Payer: Self-pay | Admitting: Vascular Surgery

## 2016-04-02 ENCOUNTER — Ambulatory Visit (HOSPITAL_COMMUNITY)
Admission: RE | Admit: 2016-04-02 | Discharge: 2016-04-02 | Disposition: A | Discharge status: Home / Self Care | Payer: Medicare Other | Source: Ambulatory Visit | Attending: Vascular Surgery | Admitting: Vascular Surgery

## 2016-04-02 ENCOUNTER — Ambulatory Visit (INDEPENDENT_AMBULATORY_CARE_PROVIDER_SITE_OTHER): Payer: Medicare Other | Admitting: Vascular Surgery

## 2016-04-02 ENCOUNTER — Encounter: Payer: Self-pay | Admitting: Vascular Surgery

## 2016-04-02 VITALS — BP 120/73 | HR 80 | Ht 65.0 in | Wt 196.0 lb

## 2016-04-02 DIAGNOSIS — F329 Major depressive disorder, single episode, unspecified: Secondary | ICD-10-CM | POA: Insufficient documentation

## 2016-04-02 DIAGNOSIS — E785 Hyperlipidemia, unspecified: Secondary | ICD-10-CM | POA: Diagnosis not present

## 2016-04-02 DIAGNOSIS — I6529 Occlusion and stenosis of unspecified carotid artery: Secondary | ICD-10-CM | POA: Diagnosis not present

## 2016-04-02 DIAGNOSIS — F419 Anxiety disorder, unspecified: Secondary | ICD-10-CM | POA: Insufficient documentation

## 2016-04-02 DIAGNOSIS — I6522 Occlusion and stenosis of left carotid artery: Secondary | ICD-10-CM | POA: Insufficient documentation

## 2016-04-02 DIAGNOSIS — I10 Essential (primary) hypertension: Secondary | ICD-10-CM | POA: Diagnosis not present

## 2016-04-02 DIAGNOSIS — K219 Gastro-esophageal reflux disease without esophagitis: Secondary | ICD-10-CM | POA: Diagnosis not present

## 2016-04-02 NOTE — Progress Notes (Signed)
[facility] [place]  Patient name: Shannon Roberts MRN: [id number] DOB: [birth year] Sex: female  REASON FOR VISIT: Follow-up carotid disease  HPI: Shannon Roberts is a 65 y.o. female here today for follow-up of carotid artery occlusive disease. She is status post right carotid endarterectomy on 01/17/2014. She continues to do well. She's had no neurologic deficits and no cardiac difficulty. She looks quite good today and reports no new major medical problems.  Past Medical History  Diagnosis Date  . IBS (irritable bowel syndrome)   . Anxiety   . GERD (gastroesophageal reflux disease)   . Depression   . Diverticulosis   . Hyperlipemia   . Hypertension   . Mitral valve prolapse   . Colitis   . COPD (chronic obstructive pulmonary disease) (HCC)     no o2  . Sciatic nerve disease   . Heart murmur   . Shortness of breath   . Anemia     per hosp. - recently- pt. told that she is anemic  . Stroke [facility]) March 2015  . Peripheral vascular disease ([place])   . Insomnia     Family History  Problem Relation Age of Onset  . Colon cancer Paternal Aunt   . Breast cancer Mother   . Ovarian cancer Mother   . Cancer Mother   . AAA (abdominal aortic aneurysm) Mother   . Pancreatic cancer Maternal Grandmother   . Diabetes Maternal Aunt   . Heart disease Paternal Grandfather   . Kidney disease Maternal Aunt   . Cancer Father     SOCIAL HISTORY: Social History  Substance Use Topics  . Smoking status: Former Smoker -- 1.00 packs/day for 40 years    Types: Cigarettes    Quit date: 01/02/2014  . Smokeless tobacco: Never Used  . Alcohol Use: No    Allergies  Allergen Reactions  . Carvedilol Palpitations  . Dapsone     flushes  . Niacin And Related Other (See Comments)    flushes  . Amoxicillin Rash    Current Outpatient Prescriptions  Medication Sig Dispense Refill  . acetaminophen (TYLENOL) 500 MG tablet Take  500-1,000 mg by mouth every 6 (six) hours as needed for mild pain.    Marland Kitchen albuterol-ipratropium (COMBIVENT) 18-103 MCG/ACT inhaler Inhale 1 puff into the lungs every 4 (four) hours.    Marland Kitchen amLODipine (NORVASC) 10 MG tablet Take 10 mg by mouth daily.    Marland Kitchen atenolol (TENORMIN) 50 MG tablet Take 50 mg by mouth daily.    . clopidogrel (PLAVIX) 75 MG tablet Take 75 mg by mouth daily with breakfast.    . DULoxetine (CYMBALTA) 60 MG capsule Take 60 mg by mouth daily.    . isosorbide mononitrate (IMDUR) 30 MG 24 hr tablet Take 30 mg by mouth 2 (two) times daily.  0  . lisinopril (PRINIVIL,ZESTRIL) 20 MG tablet Take 1 tablet (20 mg total) by mouth daily. 30 tablet 1  . LORazepam (ATIVAN) 0.5 MG tablet Take 0.5 mg by mouth at bedtime.  0  . pantoprazole (PROTONIX) 40 MG tablet Take 40 mg by mouth 2 (two) times daily as needed. For stomach upset.    . simvastatin (ZOCOR) 10 MG tablet Take 1 tablet (10 mg total) by mouth at bedtime. 30 tablet 3  . traMADol (ULTRAM) 50 MG tablet Take 50 mg by mouth as needed.    . Vitamin D, Ergocalciferol, (DRISDOL) 50000 UNITS CAPS capsule Take 50,000 Units by mouth once a  week.  0  . doxycycline (VIBRA-TABS) 100 MG tablet Take 100 mg by mouth 2 (two) times daily. Reported on 04/02/2016  0   No current facility-administered medications for this visit.    REVIEW OF SYSTEMS:  [X]  denotes positive finding, [ ]  denotes negative finding Cardiac  Comments:  Chest pain or chest pressure:    Shortness of breath upon exertion: x   Short of breath when lying flat:    Irregular heart rhythm:        Vascular    Pain in calf, thigh, or hip brought on by ambulation:    Pain in feet at night that wakes you up from your sleep:     Blood clot in your veins:    Leg swelling:         Pulmonary    Oxygen at home:    Productive cough:     Wheezing:         Neurologic    Sudden weakness in arms or legs:     Sudden numbness in arms or legs:     Sudden onset of difficulty speaking or  slurred speech:    Temporary loss of vision in one eye:     Problems with dizziness:         Gastrointestinal    Blood in stool:     Vomited blood:         Genitourinary    Burning when urinating:     Blood in urine:        Psychiatric    Major depression:         Hematologic    Bleeding problems:    Problems with blood clotting too easily:        Skin    Rashes or ulcers:        Constitutional    Fever or chills:      PHYSICAL EXAM: Filed Vitals:   04/02/16 0910 04/02/16 0911  BP: 124/81 120/73  Pulse: 80   Height: 5\' 5"  (1.651 m)   Weight: 196 lb (88.905 kg)   SpO2: 99%     GENERAL: The patient is a well-nourished female, in no acute distress. The vital signs are documented above.2 VASCULAR: Right carotid incision is well-healed. No bruits bilaterally. 2+ radial pulses bilaterallys.  MUSCULOSKELETAL: There are no major deformities or cyanosis. NEUROLOGIC: No focal weakness or paresthesias are detected. SKIN: There are no ulcers or rashes noted. PSYCHIATRIC: The patient has a normal affect.  DATA:  Duplex today reveals widely patent endarterectomy on the right. No significant left internal carotid artery stenosis. Does have stenosis in her external carotid on the left  MEDICAL ISSUES: Stable status post right carotid endarterectomy March 2015. We'll continue her usual activities. We will see her again in one year if with continued follow-up    Rosetta Posner, MD [facility] Tel [phone] Pager [phone]

## 2016-04-03 DIAGNOSIS — L0202 Furuncle of face: Secondary | ICD-10-CM | POA: Diagnosis not present

## 2016-04-05 DIAGNOSIS — K130 Diseases of lips: Secondary | ICD-10-CM | POA: Insufficient documentation

## 2016-04-05 DIAGNOSIS — Z87891 Personal history of nicotine dependence: Secondary | ICD-10-CM | POA: Diagnosis not present

## 2016-04-05 HISTORY — DX: Diseases of lips: K13.0

## 2016-04-08 DIAGNOSIS — K130 Diseases of lips: Secondary | ICD-10-CM | POA: Diagnosis not present

## 2016-04-16 ENCOUNTER — Encounter (HOSPITAL_COMMUNITY): Payer: Self-pay | Admitting: Emergency Medicine

## 2016-04-16 ENCOUNTER — Emergency Department (HOSPITAL_COMMUNITY)
Admission: EM | Admit: 2016-04-16 | Discharge: 2016-04-16 | Disposition: A | Discharge status: Home / Self Care | Payer: Medicare Other | Attending: Emergency Medicine | Admitting: Emergency Medicine

## 2016-04-16 DIAGNOSIS — Z8679 Personal history of other diseases of the circulatory system: Secondary | ICD-10-CM | POA: Diagnosis not present

## 2016-04-16 DIAGNOSIS — I10 Essential (primary) hypertension: Secondary | ICD-10-CM | POA: Insufficient documentation

## 2016-04-16 DIAGNOSIS — E785 Hyperlipidemia, unspecified: Secondary | ICD-10-CM | POA: Insufficient documentation

## 2016-04-16 DIAGNOSIS — R531 Weakness: Secondary | ICD-10-CM | POA: Diagnosis present

## 2016-04-16 DIAGNOSIS — J449 Chronic obstructive pulmonary disease, unspecified: Secondary | ICD-10-CM | POA: Diagnosis not present

## 2016-04-16 DIAGNOSIS — Z87891 Personal history of nicotine dependence: Secondary | ICD-10-CM | POA: Diagnosis not present

## 2016-04-16 DIAGNOSIS — Z79899 Other long term (current) drug therapy: Secondary | ICD-10-CM | POA: Insufficient documentation

## 2016-04-16 DIAGNOSIS — F329 Major depressive disorder, single episode, unspecified: Secondary | ICD-10-CM | POA: Diagnosis not present

## 2016-04-16 DIAGNOSIS — E860 Dehydration: Secondary | ICD-10-CM | POA: Diagnosis not present

## 2016-04-16 DIAGNOSIS — Z8673 Personal history of transient ischemic attack (TIA), and cerebral infarction without residual deficits: Secondary | ICD-10-CM | POA: Diagnosis not present

## 2016-04-16 HISTORY — DX: Cystocele, unspecified: N81.10

## 2016-04-16 HISTORY — DX: Benign neoplasm of connective and other soft tissue, unspecified: D21.9

## 2016-04-16 LAB — URINALYSIS, ROUTINE W REFLEX MICROSCOPIC
Bilirubin Urine: NEGATIVE
GLUCOSE, UA: NEGATIVE mg/dL
HGB URINE DIPSTICK: NEGATIVE
Ketones, ur: NEGATIVE mg/dL
LEUKOCYTES UA: NEGATIVE
Nitrite: NEGATIVE
Protein, ur: NEGATIVE mg/dL
SPECIFIC GRAVITY, URINE: 1.013 (ref 1.005–1.030)
pH: 6 (ref 5.0–8.0)

## 2016-04-16 LAB — CBC
HCT: 32.1 % — ABNORMAL LOW (ref 36.0–46.0)
HEMOGLOBIN: 11.4 g/dL — AB (ref 12.0–15.0)
MCH: 30 pg (ref 26.0–34.0)
MCHC: 35.5 g/dL (ref 30.0–36.0)
MCV: 84.5 fL (ref 78.0–100.0)
Platelets: 349 10*3/uL (ref 150–400)
RBC: 3.8 MIL/uL — AB (ref 3.87–5.11)
RDW: 12.7 % (ref 11.5–15.5)
WBC: 9.8 10*3/uL (ref 4.0–10.5)

## 2016-04-16 LAB — BASIC METABOLIC PANEL
Anion gap: 6 (ref 5–15)
BUN: 22 mg/dL — ABNORMAL HIGH (ref 6–20)
CO2: 23 mmol/L (ref 22–32)
Calcium: 9.4 mg/dL (ref 8.9–10.3)
Chloride: 96 mmol/L — ABNORMAL LOW (ref 101–111)
Creatinine, Ser: 1.33 mg/dL — ABNORMAL HIGH (ref 0.44–1.00)
GFR calc Af Amer: 47 mL/min — ABNORMAL LOW (ref >60)
GFR calc non Af Amer: 41 mL/min — ABNORMAL LOW (ref >60)
GLUCOSE: 121 mg/dL — AB (ref 65–99)
Potassium: 5.4 mmol/L — ABNORMAL HIGH (ref 3.5–5.1)
Sodium: 125 mmol/L — ABNORMAL LOW (ref 135–145)

## 2016-04-16 LAB — CBG MONITORING, ED: GLUCOSE-CAPILLARY: 120 mg/dL — AB (ref 65–99)

## 2016-04-16 MED ORDER — SODIUM CHLORIDE 0.9 % IV BOLUS (SEPSIS)
1000.0000 mL | Freq: Once | INTRAVENOUS | Status: AC
  Administered 2016-04-16: 1000 mL via INTRAVENOUS

## 2016-04-16 MED ORDER — MECLIZINE HCL 25 MG PO TABS
12.5000 mg | ORAL_TABLET | Freq: Once | ORAL | Status: AC
  Administered 2016-04-16: 12.5 mg via ORAL
  Filled 2016-04-16: qty 1

## 2016-04-16 NOTE — ED Notes (Signed)
Pt assisted to BR.

## 2016-04-16 NOTE — ED Notes (Signed)
Pt states she has been being seen for a staph infection (MRSA +) in her mouth and has been taking antibiotics.  Stated today she has just felt "yucky" and kind of weak all over.  States she has been taking her BP at home and it was very low prior to coming in.

## 2016-04-16 NOTE — Discharge Instructions (Signed)
Your symptoms and lab findings were most consistent with dehydration.  I am glad you're feeling better after the IV fluids. Continue to hydrate yourself to help with these symptoms. If you note it is the most prominent when going from a sitting to a standing position, get up very slowly.  Dehydration, Adult Dehydration is a condition in which you do not have enough fluid or water in your body. It happens when you take in less fluid than you lose. Vital organs such as the kidneys, brain, and heart cannot function without a proper amount of fluids. Any loss of fluids from the body can cause dehydration.  Dehydration can range from mild to severe. This condition should be treated right away to help prevent it from becoming severe. CAUSES  This condition may be caused by:  Vomiting.  Diarrhea.  Excessive sweating, such as when exercising in hot or humid weather.  Not drinking enough fluid during strenuous exercise or during an illness.  Excessive urine output.  Fever.  Certain medicines. RISK FACTORS This condition is more likely to develop in:  People who are taking certain medicines that cause the body to lose excess fluid (diuretics).   People who have a chronic illness, such as diabetes, that may increase urination.  Older adults.   People who live at high altitudes.   People who participate in endurance sports.  SYMPTOMS  Mild Dehydration  Thirst.  Dry lips.  Slightly dry mouth.  Dry, warm skin. Moderate Dehydration  Very dry mouth.   Muscle cramps.   Dark urine and decreased urine production.   Decreased tear production.   Headache.   Light-headedness, especially when you stand up from a sitting position.  Severe Dehydration  Changes in skin.   Cold and clammy skin.   Skin does not spring back quickly when lightly pinched and released.   Changes in body fluids.   Extreme thirst.   No tears.   Not able to sweat when body  temperature is high, such as in hot weather.   Minimal urine production.   Changes in vital signs.   Rapid, weak pulse (more than 100 beats per minute when you are sitting still).   Rapid breathing.   Low blood pressure.   Other changes.   Sunken eyes.   Cold hands and feet.   Confusion.  Lethargy and difficulty being awakened.  Fainting (syncope).   Short-term weight loss.   Unconsciousness. DIAGNOSIS  This condition may be diagnosed based on your symptoms. You may also have tests to determine how severe your dehydration is. These tests may include:   Urine tests.   Blood tests.  TREATMENT  Treatment for this condition depends on the severity. Mild or moderate dehydration can often be treated at home. Treatment should be started right away. Do not wait until dehydration becomes severe. Severe dehydration needs to be treated at the hospital. Treatment for Mild Dehydration  Drinking plenty of water to replace the fluid you have lost.   Replacing minerals in your blood (electrolytes) that you may have lost.  Treatment for Moderate Dehydration  Consuming oral rehydration solution (ORS). Treatment for Severe Dehydration  Receiving fluid through an IV tube.   Receiving electrolyte solution through a feeding tube that is passed through your nose and into your stomach (nasogastric tube or NG tube).  Correcting any abnormalities in electrolytes. HOME CARE INSTRUCTIONS   Drink enough fluid to keep your urine clear or pale yellow.   Drink water or fluid  slowly by taking small sips. You can also try sucking on ice cubes.  Have food or beverages that contain electrolytes. Examples include bananas and sports drinks.  Take over-the-counter and prescription medicines only as told by your health care provider.   Prepare ORS according to the manufacturer's instructions. Take sips of ORS every 5 minutes until your urine returns to normal.  If you have  vomiting or diarrhea, continue to try to drink water, ORS, or both.   If you have diarrhea, avoid:   Beverages that contain caffeine.   Fruit juice.   Milk.   Carbonated soft drinks.  Do not take salt tablets. This can lead to the condition of having too much sodium in your body (hypernatremia).  SEEK MEDICAL CARE IF:  You cannot eat or drink without vomiting.  You have had moderate diarrhea during a period of more than 24 hours.  You have a fever. SEEK IMMEDIATE MEDICAL CARE IF:   You have extreme thirst.  You have severe diarrhea.  You have not urinated in 6-8 hours, or you have urinated only a small amount of very dark urine.  You have shriveled skin.  You are dizzy, confused, or both.   This information is not intended to replace advice given to you by your health care provider. Make sure you discuss any questions you have with your health care provider.   Document Released: 10/07/2005 Document Revised: 06/28/2015 Document Reviewed: 02/22/2015 Elsevier Interactive Patient Education [employer].

## 2016-04-16 NOTE — ED Provider Notes (Signed)
CSN: [id number]     Arrival date & time 04/16/16  1747 History   First MD Initiated Contact with Patient 04/16/16 1806     Chief Complaint  Patient presents with  . Weakness    (Consider location/radiation/quality/duration/timing/severity/associated sxs/prior Treatment) HPI  Ms. Padden is a 65 y/o with a PMHx of anxiety, depression, IBS, HLD, HTN, COPD, stroke in 12/2013, PVD, and insomnia presenting with generalized weakness that began today.  She had an upper lip abscess over the last 2 weeks which she feels may have contributed. She was taking Bactrim from 6/14 to 6/20, however she discontinued this due to nausea. She was started on clindamycin 150mg . On 6/25, she continued to have no improvement in her symptoms so she was prescribed clindamycin 300mg  and Flagyl 500mg  TID.   She came into the ED today because she felt very weak around 1-2pm today. She took her BP which was low at 101/61.  She hasn't slept well because of the antibiotics. She notes feel dizzy, as if she's off balance since around 1-2pm today. She denies that the room is spinning.  No fevers, chills, myalgias, URI symptoms. She feels the lesion on her lip is improving on her new antibiotics.   Her appetite has been poor due to the antibiotics. She's had some abdominal pain as well. No diarrhea since Saturday, however she notes her BMs are still not normal.   Past Medical History  Diagnosis Date  . IBS (irritable bowel syndrome)   . Anxiety   . GERD (gastroesophageal reflux disease)   . Depression   . Diverticulosis   . Hyperlipemia   . Hypertension   . Mitral valve prolapse   . Colitis   . COPD (chronic obstructive pulmonary disease) (HCC)     no o2  . Sciatic nerve disease   . Heart murmur   . Shortness of breath   . Anemia     per hosp. - recently- pt. told that she is anemic  . Stroke [facility]) March 2015  . Peripheral vascular disease ([place])   . Insomnia   . Female bladder prolapse   . Fibroids    Past  Surgical History  Procedure Laterality Date  . Cholecystectomy    . Endarterectomy Right 01/17/2014    Procedure: RIGHT CAROTID ARTERY ENDARTERECTOMY WITH DACRON PATCH ANGIOPLASTY;  Surgeon: Rosetta Posner, MD;  Location: [facility];  Service: Vascular;  Laterality: Right;  . Carotid endarterectomy Right January 17, 2014    CE  . Colonoscopy    . Upper gastrointestinal endoscopy     Family History  Problem Relation Age of Onset  . Colon cancer Paternal Aunt   . Breast cancer Mother   . Ovarian cancer Mother   . Cancer Mother   . AAA (abdominal aortic aneurysm) Mother   . Pancreatic cancer Maternal Grandmother   . Diabetes Maternal Aunt   . Heart disease Paternal Grandfather   . Kidney disease Maternal Aunt   . Cancer Father    Social History  Substance Use Topics  . Smoking status: Former Smoker -- 1.00 packs/day for 40 years    Types: Cigarettes    Quit date: 01/02/2014  . Smokeless tobacco: Never Used  . Alcohol Use: No   OB History    No data available     Review of Systems  Constitutional: Negative for chills, diaphoresis, activity change and appetite change.  HENT: Negative for congestion, dental problem, drooling, ear pain, facial swelling, hearing loss, postnasal drip, rhinorrhea,  sinus pressure, sneezing, sore throat and trouble swallowing.   Eyes: Negative for photophobia, redness and visual disturbance.  Respiratory: Negative for cough, choking, chest tightness, shortness of breath, wheezing and stridor.   Cardiovascular: Negative for chest pain, palpitations and leg swelling.  Gastrointestinal: Negative for nausea, vomiting, abdominal pain, diarrhea, constipation and blood in stool.  Endocrine: Negative.   Genitourinary: Negative for dysuria, urgency, frequency, flank pain, decreased urine volume and pelvic pain.  Musculoskeletal: Negative for myalgias, back pain, joint swelling, arthralgias, gait problem, neck pain and neck stiffness.  Skin: Negative for rash and wound.   Allergic/Immunologic: Negative.   Neurological: Positive for dizziness and weakness. Negative for tremors, syncope, light-headedness and numbness.  Hematological: Negative.   Psychiatric/Behavioral: Negative.       Allergies  Carvedilol; Dapsone; Niacin and related; and Amoxicillin  Home Medications   Prior to Admission medications   Medication Sig Start Date End Date Taking? Authorizing Provider  acetaminophen (TYLENOL) 500 MG tablet Take 500-1,000 mg by mouth every 6 (six) hours as needed for mild pain.   Yes Historical Provider, MD  amLODipine (NORVASC) 10 MG tablet Take 10 mg by mouth daily.   Yes Historical Provider, MD  atenolol (TENORMIN) 50 MG tablet Take 50 mg by mouth daily.   Yes Historical Provider, MD  clindamycin (CLEOCIN) 300 MG capsule take 1 capsule by mouth four times a day for 14 days 04/14/16  Yes Historical Provider, MD  clopidogrel (PLAVIX) 75 MG tablet Take 75 mg by mouth daily with breakfast.   Yes Historical Provider, MD  DULoxetine (CYMBALTA) 30 MG capsule TAKE 3 CAPSULES BY MOUTH DAILY. 04/04/16  Yes Historical Provider, MD  Ipratropium-Albuterol (COMBIVENT RESPIMAT) 20-100 MCG/ACT AERS respimat Take 1 puff by mouth 3 (three) times daily.  03/15/16  Yes Historical Provider, MD  isosorbide mononitrate (IMDUR) 30 MG 24 hr tablet Take 30 mg by mouth 2 (two) times daily. 09/24/14  Yes Historical Provider, MD  lisinopril (PRINIVIL,ZESTRIL) 20 MG tablet Take 1 tablet (20 mg total) by mouth daily. 01/07/14  Yes Orson Eva, MD  LORazepam (ATIVAN) 0.5 MG tablet Take 0.5 mg by mouth at bedtime. 03/02/15  Yes Historical Provider, MD  metroNIDAZOLE (FLAGYL) 500 MG tablet take 1 tablet by mouth three times a day for 14 days 04/14/16  Yes Historical Provider, MD  mupirocin ointment (BACTROBAN) 2 % Place 1 application into the nose 4 (four) times daily.   Yes Historical Provider, MD  ondansetron (ZOFRAN-ODT) 4 MG disintegrating tablet dissolve 1 tablet ON TONGUE every 8 hours if  needed for nausea for UP TO 7 DAYS 04/14/16  Yes Historical Provider, MD  pantoprazole (PROTONIX) 40 MG tablet Take 40 mg by mouth 2 (two) times daily as needed (indigestion). For stomach upset.   Yes Historical Provider, MD  simvastatin (ZOCOR) 10 MG tablet Take 1 tablet (10 mg total) by mouth at bedtime. 01/04/14  Yes Ripudeep Krystal Eaton, MD  traMADol (ULTRAM) 50 MG tablet Take 50 mg by mouth every 6 (six) hours as needed for moderate pain or severe pain.  03/29/14  Yes Historical Provider, MD  Vitamin D, Ergocalciferol, (DRISDOL) 50000 UNITS CAPS capsule Take 50,000 Units by mouth once a week. On Sundays. 02/11/15  Yes Historical Provider, MD   BP 166/83 mmHg  Pulse 76  Temp(Src) 97.7 F (36.5 C) (Oral)  Resp 17  Ht 5' 5.5" (1.664 m)  Wt 88.905 kg  BMI 32.11 kg/m2  SpO2 100% Physical Exam  Constitutional: She is oriented to person, place,  and time. She appears well-developed and well-nourished. No distress.  HENT:  Head: Normocephalic and atraumatic.  Right Ear: External ear normal.  Left Ear: External ear normal.  Mouth/Throat: Oropharynx is clear and moist. No oropharyngeal exudate.  Small 38mm x 69mm region of induration at the left vermilion border and into the upper lip without fluctuance. Mild erythema noted. No drainage noted.   Eyes: Conjunctivae are normal. Pupils are equal, round, and reactive to light. Right eye exhibits no discharge. Left eye exhibits no discharge. No scleral icterus.  Neck: Normal range of motion.  Cardiovascular: Normal rate, regular rhythm, normal heart sounds and intact distal pulses.  Exam reveals no gallop and no friction rub.   No murmur heard. Pulmonary/Chest: Effort normal and breath sounds normal. No stridor. No respiratory distress. She has no wheezes. She has no rales. She exhibits no tenderness.  Abdominal: Soft. Bowel sounds are normal. She exhibits no distension. There is no tenderness. There is no rebound and no guarding.  Musculoskeletal: Normal range  of motion. She exhibits no edema.  Lymphadenopathy:    She has no cervical adenopathy.  Neurological: She is alert and oriented to person, place, and time. She has normal strength and normal reflexes. She is not disoriented. She displays no atrophy and no tremor. No cranial nerve deficit or sensory deficit. She exhibits normal muscle tone. She displays a negative Romberg sign. Gait normal. She displays no Babinski's sign on the right side. She displays no Babinski's sign on the left side.  Normal finger to nose.   Skin: Skin is warm and dry. No rash noted. She is not diaphoretic.  Psychiatric: She has a normal mood and affect.    ED Course  Procedures (including critical care time)  1900: patient's BMET consistent with dehydration with hyponatremia, mild hyperkalemia, and a mild increase in her SCr. Will give a 1L NS bolus and assess response. If she does not have an improvement in her symptoms, would consider brain imaging given her description of disequilibrium.   2000: IV fluids going. Patient notes she's feeling better with meclizine and fluids. Will finish fluids then see how patient ambulates.  2030: patient feeling much better. She ambulated with nursing staff without dizziness.   Labs Review Labs Reviewed  BASIC METABOLIC PANEL - Abnormal; Notable for the following:    Sodium 125 (*)    Potassium 5.4 (*)    Chloride 96 (*)    Glucose, Bld 121 (*)    BUN 22 (*)    Creatinine, Ser 1.33 (*)    GFR calc non Af Amer 41 (*)    GFR calc Af Amer 47 (*)    All other components within normal limits  CBC - Abnormal; Notable for the following:    RBC 3.80 (*)    Hemoglobin 11.4 (*)    HCT 32.1 (*)    All other components within normal limits  URINALYSIS, ROUTINE W REFLEX MICROSCOPIC (NOT AT [facility]) - Abnormal; Notable for the following:    APPearance CLOUDY (*)    All other components within normal limits  CBG MONITORING, ED - Abnormal; Notable for the following:    Glucose-Capillary  120 (*)    All other components within normal limits    Imaging Review No results found. I have personally reviewed and evaluated these images and lab results as part of my medical decision-making.   EKG Interpretation   Date/Time:  Tuesday April 16 2016 18:06:42 EDT Ventricular Rate:  90 PR Interval:  QRS Duration: 92 QT Interval:  360 QTC Calculation: 441 R Axis:   46 Text Interpretation:  Sinus rhythm Borderline prolonged PR interval Low  voltage, precordial leads No significant change since last tracing  Confirmed by Maryan Rued  MD, Loree Fee ([id number]) on 04/16/2016 6:29:11 PM      MDM   Final diagnoses:  Dehydration    Ms. Colunga is a 65 y/o presenting with generalized weakness, fatigue, and dizziness. Her labwork was consistent with dehydration with hyponatremia, mild hyperkalemia up to 5.4, and a increase in her creatinine. There is no leukocytosis, fever, or tachycardia to suggest a septic type picture.  Given her h/o CVA, initially concerned about CVA, however patient's symptoms completely resolved with IV hydration therefore this was not worked up. Patient stable for discharge home and is in agreement with the plan. Discussed staying well hydrated in the setting of her antibiotic use. Discussed standing up slowly if her symptoms return. Strict return precautions discussed.     Archie Patten, MD 04/16/16 2102  Blanchie Dessert, MD 04/17/16 1343

## 2016-04-16 NOTE — ED Notes (Signed)
Attempted to introduce self; Dr. Vallery Ridge in room.

## 2016-04-18 DIAGNOSIS — K130 Diseases of lips: Secondary | ICD-10-CM | POA: Diagnosis not present

## 2016-04-19 DIAGNOSIS — Z87891 Personal history of nicotine dependence: Secondary | ICD-10-CM | POA: Diagnosis not present

## 2016-04-19 DIAGNOSIS — E871 Hypo-osmolality and hyponatremia: Secondary | ICD-10-CM | POA: Diagnosis not present

## 2016-04-30 DIAGNOSIS — E871 Hypo-osmolality and hyponatremia: Secondary | ICD-10-CM | POA: Diagnosis not present

## 2016-04-30 DIAGNOSIS — E875 Hyperkalemia: Secondary | ICD-10-CM | POA: Diagnosis not present

## 2016-04-30 DIAGNOSIS — Z87891 Personal history of nicotine dependence: Secondary | ICD-10-CM | POA: Diagnosis not present

## 2016-04-30 DIAGNOSIS — N289 Disorder of kidney and ureter, unspecified: Secondary | ICD-10-CM | POA: Diagnosis not present

## 2016-05-08 ENCOUNTER — Encounter: Payer: Self-pay | Admitting: Neurology

## 2016-05-08 ENCOUNTER — Ambulatory Visit (INDEPENDENT_AMBULATORY_CARE_PROVIDER_SITE_OTHER): Payer: Medicare Other | Admitting: Neurology

## 2016-05-08 VITALS — BP 117/74 | HR 79 | Ht 65.0 in | Wt 196.0 lb

## 2016-05-08 DIAGNOSIS — F419 Anxiety disorder, unspecified: Secondary | ICD-10-CM | POA: Diagnosis not present

## 2016-05-08 DIAGNOSIS — I10 Essential (primary) hypertension: Secondary | ICD-10-CM | POA: Diagnosis not present

## 2016-05-08 DIAGNOSIS — M549 Dorsalgia, unspecified: Secondary | ICD-10-CM | POA: Diagnosis not present

## 2016-05-08 DIAGNOSIS — E871 Hypo-osmolality and hyponatremia: Secondary | ICD-10-CM | POA: Diagnosis not present

## 2016-05-08 DIAGNOSIS — I6990 Unspecified sequelae of unspecified cerebrovascular disease: Secondary | ICD-10-CM | POA: Diagnosis not present

## 2016-05-08 DIAGNOSIS — F324 Major depressive disorder, single episode, in partial remission: Secondary | ICD-10-CM | POA: Diagnosis not present

## 2016-05-08 NOTE — Patient Instructions (Signed)
I had a long d/w patient about her remote stroke, risk for recurrent stroke/TIAs, personally independently reviewed imaging studies and stroke evaluation results and answered questions.Continue Plavix 75 mg daily  for secondary stroke prevention and maintain strict control of hypertension with blood pressure goal below 130/90, diabetes with hemoglobin A1c goal below 6.5% and lipids with LDL cholesterol goal below 70 mg/dL. I also advised the patient to eat a healthy diet with plenty of whole grains, cereals, fruits and vegetables, exercise regularly and maintain ideal body weight Followup in the future with me in  the future only as needed and no routine follow-up appointment is necessary.

## 2016-05-08 NOTE — Progress Notes (Signed)
PATIENT: Shannon Roberts DOB: 21-Jan-1951  REASON FOR VISIT: routine follow up for stroke HISTORY FROM: patient  HISTORY OF PRESENT ILLNESS: 89 year Caucasian lady seen today for the first office follow visit for hospital admission for stroke. She presented on 01/02/14 with left hand weakness to urgent care. She was refer to [facility] for an [facility] which revealed an acute stroke in the right frontal watershed distribution.   Subsequent evaluation  By MRAshowed decrease caliber of the right internal carotid artery and decreased signal in the right A1 segment and MCA branches. Carotid ultrasound showed greater than 80% proximal right ICA stenosis. Her left hand weakness improved. Lipid profile showed total cholesterol 158, LDL 91 and HDL 47 mg percent. Hemoglobin A1c was 6.0. EKG and telemetry monitoring was normal sinus rhythm. 2DEcho showed normal   ejection fraction. Patient was started on aspirin for stroke prevention and Zocor for lipids and counselled to quit smoking. She  had elective right carotid endarterectomy a few weeks later by Dr. Donnetta Hutching and the surgery went well and she is doing great. She is no significant distal weakness and feels she starting Plavix well without significant bleeding or bruising. She is tolerating Zocor but does complain of mild muscle aches. She states her blood pressure is usually better controlled due to slightly elevated in office today. She is quit smoking is doing well but has unfortunately gained some weight. She has not been exercising regularly or monitoring her diet. She still has some diminished fine motor skills in the left hand but overall seems to be doing great.  Update 09/07/14 (LL): Since last visit, patient has not had any new neurovascular symptoms.  She had followup with Dr. Donnetta Hutching yesterday and had a good report. Blood pressure is well controlled, it is 114/71 in the office today. She is tolerating Plavix well with no signs of significant bleeding or  bruising. She has been having an increase in sciatic back pain. She is also having trouble with chronic burping; she was scheduled for diagnostic EGD in January. She admits that she is not exercising at all. She has not restarted smoking. She states she has gained 15-20 lbs since stopping smoking in March.  She has no residual numbness or weakness from her stroke. Update 03/27/2015 : She returns for follow-up after last visit 6 months ago. She continues to do well from neurovascular standpoint without recurrent stroke or TIA symptoms. She is tolerating Plavix well without bleeding, bruising or other side effects. She states her blood pressure control is quite good and today it is 110/80 in office today. She has an she had a follow-up visit with vascular surgeon Dr. Donnetta Hutching last Tuesday and carotid ultrasound looks good. She had lipid profile checked a month ago by primary physician and it was fine. She has been diagnosed with a 4 cm ovarian cyst which is likely benign and is being followed conservatively. Her last hemoglobin A1c was 6.1. Update 05/08/2016 ; she returns for follow-up after last visit a year ago. She continues to do well without recurrent stroke or TIA symptoms. She remains on Plavix which is tolerating well with only minor bruising and no bleeding. She discontinued her cholesterol medication as she was getting muscle aches. She is on CoQ10. She had recent lab work done by primary physician. She states her blood pressure is well controlled and today it is 117/72. Check follow-up carotid ultrasound done in Dr. Marguerita Beards office on 04/02/16 which have reviewed and shows no significant  right carotid restenosis but less than 40% left ICA stenosis. She has no new complaints today. REVIEW OF SYSTEMS: Full 14 system review of systems performed and notable only for:  Fatigue, loss of vision, shortness of breath, insomnia, daytime sleepiness, memory loss, back pain, walking difficulty, neck pain, neck stiffness,  depression, nervousness and anxiety and all other systems negative.  ALLERGIES: Allergies  Allergen Reactions  . Carvedilol Palpitations  . Dapsone     flushes  . Niacin And Related Other (See Comments)    flushes  . Amoxicillin Rash    HOME MEDICATIONS: Outpatient Prescriptions Prior to Visit  Medication Sig Dispense Refill  . acetaminophen (TYLENOL) 500 MG tablet Take 500-1,000 mg by mouth every 6 (six) hours as needed for mild pain.    Marland Kitchen amLODipine (NORVASC) 10 MG tablet Take 5 mg by mouth daily.     Marland Kitchen atenolol (TENORMIN) 50 MG tablet Take 50 mg by mouth daily.    . clopidogrel (PLAVIX) 75 MG tablet Take 75 mg by mouth daily with breakfast.    . DULoxetine (CYMBALTA) 30 MG capsule TAKE 3 CAPSULES BY MOUTH DAILY.  0  . Ipratropium-Albuterol (COMBIVENT RESPIMAT) 20-100 MCG/ACT AERS respimat Take 1 puff by mouth 3 (three) times daily.     . isosorbide mononitrate (IMDUR) 30 MG 24 hr tablet Take 30 mg by mouth 2 (two) times daily.  0  . lisinopril (PRINIVIL,ZESTRIL) 20 MG tablet Take 1 tablet (20 mg total) by mouth daily. 30 tablet 1  . LORazepam (ATIVAN) 0.5 MG tablet Take 0.5 mg by mouth at bedtime.  0  . mupirocin ointment (BACTROBAN) 2 % 1 application by Other route 3 (three) times daily. To lip three times a day    . pantoprazole (PROTONIX) 40 MG tablet Take 40 mg by mouth 2 (two) times daily as needed (indigestion). For stomach upset.    . simvastatin (ZOCOR) 10 MG tablet Take 1 tablet (10 mg total) by mouth at bedtime. 30 tablet 3  . traMADol (ULTRAM) 50 MG tablet Take 50 mg by mouth every 6 (six) hours as needed for moderate pain or severe pain.     . Vitamin D, Ergocalciferol, (DRISDOL) 50000 UNITS CAPS capsule Take 50,000 Units by mouth once a week. On Sundays.  0  . clindamycin (CLEOCIN) 300 MG capsule Reported on 05/08/2016  0  . metroNIDAZOLE (FLAGYL) 500 MG tablet Reported on 05/08/2016  0  . ondansetron (ZOFRAN-ODT) 4 MG disintegrating tablet Reported on 05/08/2016  0    No facility-administered medications prior to visit.    PHYSICAL EXAM Filed Vitals:   05/08/16 1556  BP: 117/74  Pulse: 79  Height: 5\' 5"  (1.651 m)  Weight: 196 lb (88.905 kg)   Body mass index is 32.62 kg/(m^2).  Physical Exam General: well developed, well nourished, seated, in no evident distress Head: head normocephalic and atraumatic. Orohparynx benign Neck: supple with no carotid or supraclavicular bruits. Healed right carotid endarterectomy scar Cardiovascular: regular rate and rhythm, no murmurs Musculoskeletal: no deformity Skin:  no rash/petichiae Vascular:  Normal pulses all extremities  Neurologic Exam Mental Status: Awake and fully alert. Oriented to place and time. Recent and remote memory intact. Attention span, concentration and fund of knowledge appropriate. Mood and affect appropriate.   Cranial Nerves: Pupils equal, briskly reactive to light. Extraocular movements full without nystagmus. Visual fields full to confrontation. Hearing intact. Facial sensation intact. Face, tongue, palate moves normally and symmetrically.   Motor: Normal bulk and tone. Normal strength in all  tested extremity muscles. Sensory: intact to touch and pinprick and vibratory sensation.   Coordination: Rapid alternating movements normal in all extremities. Finger-to-nose and heel-to-shin performed accurately bilaterally. Gait and Station: Arises from chair without difficulty. Stance is normal. Gait demonstrates normal stride length and balance . Able to heel, toe and tandem walk with   difficulty.   Reflexes: 1+ and symmetric. Toes downgoing.       ASSESSMENT: 68 year Caucasian lady with a right brain watershed infarct symptomatic from greater than 80% proximal right ICA stenosis status post elective carotid endarterectomy is doing clinically quite well. Vascular factors of carotid stenosis, previous smoking, hypertension, hyperlipidemia and mild obesity.  PLAN:  I had a long d/w  patient about her remote stroke, risk for recurrent stroke/TIAs, personally independently reviewed imaging studies and stroke evaluation results and answered questions.Continue Plavix 75 mg daily  for secondary stroke prevention and maintain strict control of hypertension with blood pressure goal below 130/90, diabetes with hemoglobin A1c goal below 6.5% and lipids with LDL cholesterol goal below 70 mg/dL. I also advised the patient to eat a healthy diet with plenty of whole grains, cereals, fruits and vegetables, exercise regularly and maintain ideal body weight .she was advised to continue follow-up with a vascular surgeon to follow-up carotid stenosis and her primary physician to manage her modifiable stroke risk factors. There is a greater than 50% time during this 25 minute visit was spent on counseling and coordination of care about her stroke risk and prevention Followup in the future with me in  the future only as needed and no routine follow-up appointment is necessary.   Antony Contras, MD  05/08/2016, 4:25 PM [facility] [address] [place], [place] [address] [phone]  Note: This document was prepared with digital dictation and possible smart phrase technology. Any transcriptional errors that result from this process are unintentional.

## 2016-05-10 ENCOUNTER — Telehealth: Payer: Self-pay | Admitting: Neurology

## 2016-05-10 NOTE — Telephone Encounter (Signed)
She called Friday afternoon:    She had a 4-5 minute episode where she noted flashing lights involving both eyes and involving the left and right visual fields.   She is completely back to baseline and noted no other neurologic symptoms.    Chart reviewed.  Advised to let us know if more similar spells occur.  If she has more severe episode she should go to ER.   She voiced understanding

## 2016-05-13 NOTE — Telephone Encounter (Signed)
Thanks. Agree with plan 

## 2016-05-15 DIAGNOSIS — H5213 Myopia, bilateral: Secondary | ICD-10-CM | POA: Diagnosis not present

## 2016-05-16 NOTE — Addendum Note (Signed)
Addended by: Thresa Ross C on: 05/16/2016 11:05 AM   Modules accepted: Orders

## 2016-05-28 DIAGNOSIS — K130 Diseases of lips: Secondary | ICD-10-CM | POA: Diagnosis not present

## 2016-06-06 ENCOUNTER — Encounter: Payer: Self-pay | Admitting: Family Medicine

## 2016-07-08 DIAGNOSIS — S20221A Contusion of right back wall of thorax, initial encounter: Secondary | ICD-10-CM | POA: Diagnosis not present

## 2016-07-22 DIAGNOSIS — M545 Low back pain: Secondary | ICD-10-CM | POA: Diagnosis not present

## 2016-07-22 DIAGNOSIS — Z23 Encounter for immunization: Secondary | ICD-10-CM | POA: Diagnosis not present

## 2016-07-29 DIAGNOSIS — J988 Other specified respiratory disorders: Secondary | ICD-10-CM | POA: Diagnosis not present

## 2016-08-14 DIAGNOSIS — F419 Anxiety disorder, unspecified: Secondary | ICD-10-CM | POA: Diagnosis not present

## 2016-08-14 DIAGNOSIS — M5430 Sciatica, unspecified side: Secondary | ICD-10-CM | POA: Diagnosis not present

## 2016-10-03 DIAGNOSIS — E119 Type 2 diabetes mellitus without complications: Secondary | ICD-10-CM | POA: Diagnosis not present

## 2016-10-03 DIAGNOSIS — J449 Chronic obstructive pulmonary disease, unspecified: Secondary | ICD-10-CM | POA: Diagnosis not present

## 2016-10-03 DIAGNOSIS — R062 Wheezing: Secondary | ICD-10-CM | POA: Diagnosis not present

## 2016-10-03 DIAGNOSIS — R0989 Other specified symptoms and signs involving the circulatory and respiratory systems: Secondary | ICD-10-CM | POA: Diagnosis not present

## 2016-10-10 DIAGNOSIS — R062 Wheezing: Secondary | ICD-10-CM | POA: Diagnosis not present

## 2016-10-10 DIAGNOSIS — I10 Essential (primary) hypertension: Secondary | ICD-10-CM | POA: Diagnosis not present

## 2016-10-10 DIAGNOSIS — E119 Type 2 diabetes mellitus without complications: Secondary | ICD-10-CM | POA: Diagnosis not present

## 2016-10-10 DIAGNOSIS — E871 Hypo-osmolality and hyponatremia: Secondary | ICD-10-CM | POA: Diagnosis not present

## 2016-10-10 DIAGNOSIS — J449 Chronic obstructive pulmonary disease, unspecified: Secondary | ICD-10-CM | POA: Diagnosis not present

## 2016-12-31 DIAGNOSIS — R05 Cough: Secondary | ICD-10-CM | POA: Diagnosis not present

## 2017-01-30 ENCOUNTER — Observation Stay (HOSPITAL_COMMUNITY)
Admission: EM | Admit: 2017-01-30 | Discharge: 2017-01-31 | Disposition: A | Discharge status: Home / Self Care | Payer: Medicare Other | Attending: Internal Medicine | Admitting: Internal Medicine

## 2017-01-30 ENCOUNTER — Encounter (HOSPITAL_COMMUNITY): Payer: Self-pay | Admitting: *Deleted

## 2017-01-30 ENCOUNTER — Emergency Department (HOSPITAL_COMMUNITY): Payer: Medicare Other

## 2017-01-30 DIAGNOSIS — N189 Chronic kidney disease, unspecified: Secondary | ICD-10-CM | POA: Diagnosis not present

## 2017-01-30 DIAGNOSIS — D72829 Elevated white blood cell count, unspecified: Secondary | ICD-10-CM | POA: Insufficient documentation

## 2017-01-30 DIAGNOSIS — I129 Hypertensive chronic kidney disease with stage 1 through stage 4 chronic kidney disease, or unspecified chronic kidney disease: Secondary | ICD-10-CM | POA: Insufficient documentation

## 2017-01-30 DIAGNOSIS — R079 Chest pain, unspecified: Secondary | ICD-10-CM | POA: Diagnosis present

## 2017-01-30 DIAGNOSIS — D631 Anemia in chronic kidney disease: Secondary | ICD-10-CM

## 2017-01-30 DIAGNOSIS — Z7982 Long term (current) use of aspirin: Secondary | ICD-10-CM | POA: Diagnosis not present

## 2017-01-30 DIAGNOSIS — Z79899 Other long term (current) drug therapy: Secondary | ICD-10-CM | POA: Diagnosis not present

## 2017-01-30 DIAGNOSIS — Z8679 Personal history of other diseases of the circulatory system: Secondary | ICD-10-CM | POA: Diagnosis not present

## 2017-01-30 DIAGNOSIS — E785 Hyperlipidemia, unspecified: Secondary | ICD-10-CM | POA: Diagnosis not present

## 2017-01-30 DIAGNOSIS — Z87891 Personal history of nicotine dependence: Secondary | ICD-10-CM | POA: Insufficient documentation

## 2017-01-30 DIAGNOSIS — Z7902 Long term (current) use of antithrombotics/antiplatelets: Secondary | ICD-10-CM | POA: Insufficient documentation

## 2017-01-30 DIAGNOSIS — J449 Chronic obstructive pulmonary disease, unspecified: Secondary | ICD-10-CM | POA: Diagnosis not present

## 2017-01-30 DIAGNOSIS — R072 Precordial pain: Secondary | ICD-10-CM | POA: Diagnosis not present

## 2017-01-30 DIAGNOSIS — R0602 Shortness of breath: Secondary | ICD-10-CM | POA: Diagnosis not present

## 2017-01-30 DIAGNOSIS — R0789 Other chest pain: Secondary | ICD-10-CM

## 2017-01-30 DIAGNOSIS — K219 Gastro-esophageal reflux disease without esophagitis: Secondary | ICD-10-CM | POA: Diagnosis not present

## 2017-01-30 DIAGNOSIS — I10 Essential (primary) hypertension: Secondary | ICD-10-CM

## 2017-01-30 DIAGNOSIS — Z8673 Personal history of transient ischemic attack (TIA), and cerebral infarction without residual deficits: Secondary | ICD-10-CM | POA: Diagnosis not present

## 2017-01-30 DIAGNOSIS — I4510 Unspecified right bundle-branch block: Secondary | ICD-10-CM | POA: Diagnosis not present

## 2017-01-30 LAB — I-STAT TROPONIN, ED: Troponin i, poc: 0 ng/mL (ref 0.00–0.08)

## 2017-01-30 LAB — CBC
HCT: 34.1 % — ABNORMAL LOW (ref 36.0–46.0)
Hemoglobin: 11.4 g/dL — ABNORMAL LOW (ref 12.0–15.0)
MCH: 30.8 pg (ref 26.0–34.0)
MCHC: 33.4 g/dL (ref 30.0–36.0)
MCV: 92.2 fL (ref 78.0–100.0)
Platelets: 270 10*3/uL (ref 150–400)
RBC: 3.7 MIL/uL — ABNORMAL LOW (ref 3.87–5.11)
RDW: 12.8 % (ref 11.5–15.5)
WBC: 10.8 10*3/uL — ABNORMAL HIGH (ref 4.0–10.5)

## 2017-01-30 LAB — BASIC METABOLIC PANEL
Anion gap: 10 (ref 5–15)
BUN: 19 mg/dL (ref 6–20)
CALCIUM: 9.7 mg/dL (ref 8.9–10.3)
CO2: 25 mmol/L (ref 22–32)
Chloride: 99 mmol/L — ABNORMAL LOW (ref 101–111)
Creatinine, Ser: 1.36 mg/dL — ABNORMAL HIGH (ref 0.44–1.00)
GFR calc Af Amer: 46 mL/min — ABNORMAL LOW (ref >60)
GFR, EST NON AFRICAN AMERICAN: 39 mL/min — AB (ref >60)
GLUCOSE: 131 mg/dL — AB (ref 65–99)
POTASSIUM: 4.3 mmol/L (ref 3.5–5.1)
Sodium: 134 mmol/L — ABNORMAL LOW (ref 135–145)

## 2017-01-30 MED ORDER — LORAZEPAM 0.5 MG PO TABS
0.5000 mg | ORAL_TABLET | Freq: Two times a day (BID) | ORAL | Status: DC | PRN
  Administered 2017-01-31 (×2): 0.5 mg via ORAL
  Filled 2017-01-30 (×2): qty 1

## 2017-01-30 MED ORDER — ATENOLOL 50 MG PO TABS
50.0000 mg | ORAL_TABLET | Freq: Every day | ORAL | Status: DC
  Administered 2017-01-31: 50 mg via ORAL
  Filled 2017-01-30: qty 1
  Filled 2017-01-30: qty 2

## 2017-01-30 MED ORDER — ISOSORBIDE MONONITRATE ER 30 MG PO TB24
30.0000 mg | ORAL_TABLET | Freq: Two times a day (BID) | ORAL | Status: DC
  Administered 2017-01-31 (×2): 30 mg via ORAL
  Filled 2017-01-30 (×2): qty 1

## 2017-01-30 MED ORDER — TRAMADOL HCL 50 MG PO TABS
50.0000 mg | ORAL_TABLET | Freq: Four times a day (QID) | ORAL | Status: DC | PRN
  Administered 2017-01-31: 50 mg via ORAL
  Filled 2017-01-30: qty 1

## 2017-01-30 MED ORDER — ENOXAPARIN SODIUM 40 MG/0.4ML ~~LOC~~ SOLN
40.0000 mg | Freq: Every day | SUBCUTANEOUS | Status: DC
  Administered 2017-01-31: 40 mg via SUBCUTANEOUS
  Filled 2017-01-30: qty 0.4

## 2017-01-30 MED ORDER — MORPHINE SULFATE (PF) 2 MG/ML IV SOLN: 2.0000 mg | INTRAVENOUS | Status: DC | PRN

## 2017-01-30 MED ORDER — NITROGLYCERIN 0.4 MG SL SUBL: 0.4000 mg | SUBLINGUAL_TABLET | SUBLINGUAL | Status: DC | PRN

## 2017-01-30 MED ORDER — ACETAMINOPHEN 325 MG PO TABS
650.0000 mg | ORAL_TABLET | ORAL | Status: DC | PRN
  Filled 2017-01-30: qty 2

## 2017-01-30 MED ORDER — ASPIRIN 81 MG PO CHEW
324.0000 mg | CHEWABLE_TABLET | Freq: Once | ORAL | Status: AC
  Administered 2017-01-30: 324 mg via ORAL
  Filled 2017-01-30: qty 4

## 2017-01-30 MED ORDER — AMLODIPINE BESYLATE 5 MG PO TABS
5.0000 mg | ORAL_TABLET | Freq: Every day | ORAL | Status: DC
  Administered 2017-01-31: 5 mg via ORAL
  Filled 2017-01-30: qty 1

## 2017-01-30 MED ORDER — SIMVASTATIN 10 MG PO TABS
10.0000 mg | ORAL_TABLET | Freq: Every day | ORAL | Status: DC
  Administered 2017-01-31: 10 mg via ORAL
  Filled 2017-01-30: qty 1

## 2017-01-30 MED ORDER — CLOPIDOGREL BISULFATE 75 MG PO TABS
75.0000 mg | ORAL_TABLET | Freq: Every day | ORAL | Status: DC
  Administered 2017-01-31: 75 mg via ORAL
  Filled 2017-01-30: qty 1

## 2017-01-30 MED ORDER — IPRATROPIUM-ALBUTEROL 0.5-2.5 (3) MG/3ML IN SOLN
3.0000 mL | Freq: Four times a day (QID) | RESPIRATORY_TRACT | Status: DC | PRN
  Administered 2017-01-31 (×2): 3 mL via RESPIRATORY_TRACT
  Filled 2017-01-30 (×2): qty 3

## 2017-01-30 MED ORDER — DULOXETINE HCL 60 MG PO CPEP
90.0000 mg | ORAL_CAPSULE | Freq: Every evening | ORAL | Status: DC
  Filled 2017-01-30: qty 1

## 2017-01-30 MED ORDER — GI COCKTAIL ~~LOC~~: 30.0000 mL | Freq: Four times a day (QID) | ORAL | Status: DC | PRN

## 2017-01-30 MED ORDER — PANTOPRAZOLE SODIUM 40 MG PO TBEC
40.0000 mg | DELAYED_RELEASE_TABLET | Freq: Two times a day (BID) | ORAL | Status: DC
  Administered 2017-01-31 (×2): 40 mg via ORAL
  Filled 2017-01-30 (×2): qty 1

## 2017-01-30 MED ORDER — SODIUM CHLORIDE 0.9 % IV SOLN
INTRAVENOUS | Status: DC
  Administered 2017-01-31: 04:00:00 via INTRAVENOUS

## 2017-01-30 MED ORDER — LISINOPRIL 40 MG PO TABS
40.0000 mg | ORAL_TABLET | Freq: Every day | ORAL | Status: DC
  Administered 2017-01-31: 40 mg via ORAL
  Filled 2017-01-30: qty 1

## 2017-01-30 MED ORDER — ONDANSETRON HCL 4 MG/2ML IJ SOLN
4.0000 mg | Freq: Four times a day (QID) | INTRAMUSCULAR | Status: DC | PRN
Start: 2017-01-30 — End: 2017-01-31

## 2017-01-30 NOTE — ED Provider Notes (Signed)
Emergency Department Provider Note   I have reviewed the triage vital signs and the nursing notes.   HISTORY  Chief Complaint Chest Pain   HPI Shannon Roberts is a 66 y.o. female with PMH of COPD, HLD, HTN, CVA and PVD presents to the emergency department for evaluation of intermittent chest tightness with radiation to the neck and left arm. Patient with no history of similar symptoms. She has very mild chest tightness during my evaluation. No exacerbating or alleviating factors. No injury to the chest wall. Patient denies any increased shortness of breath but does have a history of COPD. Denies any fever, chills, productive cough. She has no history of coronary artery disease. She did have a stress test several years ago because of an abnormal EKG that her primary care physician's office but was not having symptoms at that time.   Past Medical History:  Diagnosis Date  . Anemia    per hosp. - recently- pt. told that she is anemic  . Anxiety   . Colitis   . COPD (chronic obstructive pulmonary disease) (HCC)    no o2  . Depression   . Diverticulosis   . Female bladder prolapse   . Fibroids   . GERD (gastroesophageal reflux disease)   . Heart murmur   . Hyperlipemia   . Hypertension   . IBS (irritable bowel syndrome)   . Insomnia   . Mitral valve prolapse   . Peripheral vascular disease ([place])   . Sciatic nerve disease   . Shortness of breath   . Stroke [facility]) March 2015    Patient Active Problem List   Diagnosis Date Noted  . Chest pain 01/30/2017  . Belching 03/13/2015  . Pain of right thigh- and Left 09/06/2014  . Nonulcer dyspepsia 09/01/2014  . Occlusion and stenosis of carotid artery without mention of cerebral infarction 02/08/2014  . Carotid stenosis 01/17/2014  . Hyponatremia 01/04/2014  . COPD exacerbation ([place]) 01/03/2014  . CVA (cerebral infarction) 01/03/2014  . Stroke ([place]) 01/02/2014  . Tobacco abuse 10/07/2011  . HTN (hypertension), malignant  10/07/2011  . Hyperlipidemia 10/07/2011  . Anxiety disorder 10/07/2011  . Esophageal reflux 05/13/2011  . Dysphagia, unspecified(787.20) 05/13/2011  . Irritable bowel syndrome 05/13/2011  . Personal history of colonic polyps 05/13/2011  . COPD (chronic obstructive pulmonary disease) ([place]) 05/13/2011    Past Surgical History:  Procedure Laterality Date  . CAROTID ENDARTERECTOMY Right January 17, 2014   CE  . CHOLECYSTECTOMY    . COLONOSCOPY    . ENDARTERECTOMY Right 01/17/2014   Procedure: RIGHT CAROTID ARTERY ENDARTERECTOMY WITH DACRON PATCH ANGIOPLASTY;  Surgeon: Rosetta Posner, MD;  Location: [place];  Service: Vascular;  Laterality: Right;  . UPPER GASTROINTESTINAL ENDOSCOPY      Current Outpatient Rx  . Order #: [id number] Class: Historical Med  . Order #: [id number] Class: Historical Med  . Order #: [id number] Class: Historical Med  . Order #: [id number] Class: Historical Med  . Order #: [id number] Class: Historical Med  . Order #: [id number] Class: Historical Med  . Order #: [id number] Class: Historical Med  . Order #: [id number] Class: Historical Med  . Order #: [id number] Class: Historical Med  . Order #: [id number] Class: Historical Med  . Order #: [id number] Class: Historical Med  . Order #: [id number] Class: Historical Med  . Order #: [id number] Class: Print  . Order #: [id number] Class: Historical Med    Allergies Carvedilol; Dapsone; Niacin and related; and Amoxicillin  Family History  Problem Relation Age of Onset  . Breast cancer Mother   .  Ovarian cancer Mother   . Cancer Mother   . AAA (abdominal aortic aneurysm) Mother   . Cancer Father   . Colon cancer Paternal Aunt   . Pancreatic cancer Maternal Grandmother   . Diabetes Maternal Aunt   . Heart disease Paternal Grandfather   . Kidney disease Maternal Aunt     Social History Social History  Substance Use Topics  . Smoking status: Former Smoker    Packs/day: 1.00    Years: 40.00    Types: Cigarettes    Quit date: 01/02/2014    . Smokeless tobacco: Never Used  . Alcohol use No    Review of Systems  Constitutional: No fever/chills Eyes: No visual changes. ENT: No sore throat. Cardiovascular: Positive chest pain. Respiratory: Denies shortness of breath. Gastrointestinal: No abdominal pain.  No nausea, no vomiting.  No diarrhea.  No constipation. Genitourinary: Negative for dysuria. Musculoskeletal: Negative for back pain. Skin: Negative for rash. Neurological: Negative for headaches, focal weakness or numbness.  10-point ROS otherwise negative.  ____________________________________________   PHYSICAL EXAM:  VITAL SIGNS: ED Triage Vitals [01/30/17 1958]  Enc Vitals Group     BP (!) 173/92     Pulse Rate 99     Resp 18     Temp 97.7 F (36.5 C)     Temp Source Oral     SpO2 98 %   Constitutional: Alert and oriented. Well appearing and in no acute distress. Eyes: Conjunctivae are normal.  Head: Atraumatic. Nose: No congestion/rhinnorhea. Mouth/Throat: Mucous membranes are moist.  Neck: No stridor. Cardiovascular: Normal rate, regular rhythm. Good peripheral circulation. Grossly normal heart sounds.   Respiratory: Normal respiratory effort.  No retractions. Lungs CTAB. Gastrointestinal: Soft and nontender. No distention.  Musculoskeletal: No lower extremity tenderness nor edema. No gross deformities of extremities. Neurologic:  Normal speech and language. No gross focal neurologic deficits are appreciated.  Skin:  Skin is warm, dry and intact. No rash noted.  ____________________________________________   LABS (all labs ordered are listed, but only abnormal results are displayed)  Labs Reviewed  BASIC METABOLIC PANEL - Abnormal; Notable for the following:       Result Value   Sodium 134 (*)    Chloride 99 (*)    Glucose, Bld 131 (*)    Creatinine, Ser 1.36 (*)    GFR calc non Af Amer 39 (*)    GFR calc Af Amer 46 (*)    All other components within normal limits  CBC - Abnormal;  Notable for the following:    WBC 10.8 (*)    RBC 3.70 (*)    Hemoglobin 11.4 (*)    HCT 34.1 (*)    All other components within normal limits  TROPONIN I  TROPONIN I  TROPONIN I  I-STAT TROPOININ, ED   ____________________________________________  EKG   EKG Interpretation  Date/Time:  Thursday January 30 2017 23:04:48 EDT Ventricular Rate:  96 PR Interval:    QRS Duration: 140 QT Interval:  406 QTC Calculation: 514 R Axis:   -19 Text Interpretation:  Sinus rhythm Ventricular premature complex Right bundle branch block No STEMI.  Confirmed by LONG MD, JOSHUA [phone]) on 01/30/2017 11:10:59 PM       ____________________________________________  RADIOLOGY  Dg Chest 2 View  Result Date: 01/30/2017 CLINICAL DATA:  Initial evaluation for acute left chest pain since yesterday, shortness of breath. EXAM: CHEST  2 VIEW COMPARISON:  Prior radiograph from 01/06/2014. FINDINGS: Cardiac and mediastinal silhouettes are stable in size  and contour, and remain within normal limits. Lungs are hypoinflated. Secondary mild bibasilar bronchovascular crowding/ atelectasis. No focal infiltrates. No pulmonary edema or pleural effusion. No pneumothorax. No acute osseus abnormality. IMPRESSION: 1. Shallow lung inflation with mild bibasilar atelectasis/bronchovascular crowding. 2. No other active cardiopulmonary disease. Electronically Signed   By: Jeannine Boga M.D.   On: 01/30/2017 20:52    ____________________________________________   PROCEDURES  Procedure(s) performed:   Procedures  None ____________________________________________   INITIAL IMPRESSION / ASSESSMENT AND PLAN / ED COURSE  Pertinent labs & imaging results that were available during my care of the patient were reviewed by me and considered in my medical decision making (see chart for details).  Patient resents to the emergency department for evaluation of intermittent chest tightness. She is currently having some  mild discomfort. Her EKG is abnormal in that there is a new right bundle branch block. As her symptoms to suggest pulmonary embolism. She has history of COPD and so feels short of breath almost always with no increased dyspnea symptoms. Plan for aspirin, nitroglycerin, and reassessment. Initial troponin is negative. HEART score of 6.   Discussed patient's case with hospitalist, Dr. Tamala Julian. Patient and daughter (by phone) updated with plan. Care transferred to hospitalist service.  I reviewed all nursing notes, vitals, pertinent old records, EKGs, labs, imaging (as available).  ____________________________________________  FINAL CLINICAL IMPRESSION(S) / ED DIAGNOSES  Final diagnoses:  Precordial chest pain     MEDICATIONS GIVEN DURING THIS VISIT:  Medications  nitroGLYCERIN (NITROSTAT) SL tablet 0.4 mg (not administered)  ipratropium-albuterol (DUONEB) 0.5-2.5 (3) MG/3ML nebulizer solution 3 mL (not administered)  DULoxetine (CYMBALTA) DR capsule 90 mg (not administered)  clopidogrel (PLAVIX) tablet 75 mg (not administered)  atenolol (TENORMIN) tablet 50 mg (not administered)  pantoprazole (PROTONIX) EC tablet 40 mg (not administered)  simvastatin (ZOCOR) tablet 10 mg (not administered)  traMADol (ULTRAM) tablet 50 mg (not administered)  LORazepam (ATIVAN) tablet 0.5 mg (not administered)  isosorbide mononitrate (IMDUR) 24 hr tablet 30 mg (not administered)  amLODipine (NORVASC) tablet 5 mg (not administered)  lisinopril (PRINIVIL,ZESTRIL) tablet 40 mg (not administered)  acetaminophen (TYLENOL) tablet 650 mg (not administered)  ondansetron (ZOFRAN) injection 4 mg (not administered)  enoxaparin (LOVENOX) injection 40 mg (not administered)  morphine 2 MG/ML injection 2 mg (not administered)  gi cocktail (Maalox,Lidocaine,Donnatal) (not administered)  0.9 %  sodium chloride infusion (not administered)  aspirin chewable tablet 324 mg (324 mg Oral Given 01/30/17 2245)     NEW  OUTPATIENT MEDICATIONS STARTED DURING THIS VISIT:  None   Note:  This document was prepared using Dragon voice recognition software and may include unintentional dictation errors.  Nanda Quinton, MD Emergency Medicine   Margette Fast, MD 01/30/17 [id number]

## 2017-01-30 NOTE — H&P (Signed)
History and Physical    Shannon Roberts JJO:[id number] DOB: 08-09-1951 DOA: 01/30/2017  Referring MD/NP/PA: Dr. Laverta Baltimore PCP: Melinda Crutch, MD  Patient coming from: home  Chief Complaint: Chest pain  HPI: Shannon Roberts is a 66 y.o. female with medical history significant of anxiety, anemia, COPD, IBS, CVA, GERD; who presents with complaints of left-sided chest pain for last 2 days. Pain was initially in her left shoulder with radiation into her neck, but moved into her left breast and underneath her left arm yesterday. Describes pain as a pinching sensation and pressure. Rates pain a 3 out of 10 on the pain scale and symptoms only last a few seconds to minutes. Denies any nausea, vomiting, diaphoresis, shortness breath, fever, sick contacts, leg swelling, calf pain, or orthopnea. Associated symptoms include intermittent palpitations, anxiety, weight gain of 3-4 pounds over last 2-3 months. 3 days ago patient reports scrubbing the floor for which she used her right hand to scrub in the left hand to brace herself on the floor. Patient is followed by Dr. Wynonia Lawman of cardiology. Previously evaluated with stress test that was normal about 2 years ago,and last echocardiogram approximately 3 years ago.  ED Course: Upon admission to emergency department patient was seen to be afebrile with blood pressure elevated up to 173/92, and all other vitals relatively within normal limits. Labs revealed WBC 10.8, Hemoglobin 11.4, troponin 0.0. Initial EKG showing signs of a right bundle-branch block that appear to be new.  Review of Systems: As per HPI otherwise 10 point review of systems negative.   Past Medical History:  Diagnosis Date  . Anemia    per hosp. - recently- pt. told that she is anemic  . Anxiety   . Colitis   . COPD (chronic obstructive pulmonary disease) (HCC)    no o2  . Depression   . Diverticulosis   . Female bladder prolapse   . Fibroids   . GERD (gastroesophageal reflux disease)   . Heart  murmur   . Hyperlipemia   . Hypertension   . IBS (irritable bowel syndrome)   . Insomnia   . Mitral valve prolapse   . Peripheral vascular disease ([place])   . Sciatic nerve disease   . Shortness of breath   . Stroke [facility]) March 2015    Past Surgical History:  Procedure Laterality Date  . CAROTID ENDARTERECTOMY Right January 17, 2014   CE  . CHOLECYSTECTOMY    . COLONOSCOPY    . ENDARTERECTOMY Right 01/17/2014   Procedure: RIGHT CAROTID ARTERY ENDARTERECTOMY WITH DACRON PATCH ANGIOPLASTY;  Surgeon: Rosetta Posner, MD;  Location: [facility] OR;  Service: Vascular;  Laterality: Right;  . UPPER GASTROINTESTINAL ENDOSCOPY       reports that she quit smoking about 3 years ago. Her smoking use included Cigarettes. She has a 40.00 pack-year smoking history. She has never used smokeless tobacco. She reports that she does not drink alcohol or use drugs.  Allergies  Allergen Reactions  . Carvedilol Palpitations  . Dapsone Other (See Comments)    Reaction:  [place]   . Niacin And Related Other (See Comments)    Reaction:  [place]   . Amoxicillin Rash and Other (See Comments)    Has patient had a PCN reaction causing immediate rash, facial/tongue/throat swelling, SOB or lightheadedness with hypotension: No Has patient had a PCN reaction causing severe rash involving mucus membranes or skin necrosis: No Has patient had a PCN reaction that required hospitalization No Has patient had a  PCN reaction occurring within the last 10 years: No If all of the above answers are "NO", then may proceed with Cephalosporin use.    Family History  Problem Relation Age of Onset  . Breast cancer Mother   . Ovarian cancer Mother   . Cancer Mother   . AAA (abdominal aortic aneurysm) Mother   . Cancer Father   . Colon cancer Paternal Aunt   . Pancreatic cancer Maternal Grandmother   . Diabetes Maternal Aunt   . Heart disease Paternal Grandfather   . Kidney disease Maternal Aunt     Prior to Admission  medications   Medication Sig Start Date End Date Taking? Authorizing Provider  acetaminophen (TYLENOL) 500 MG tablet Take 1,000 mg by mouth every 6 (six) hours as needed for mild pain, moderate pain, fever or headache.    Yes Historical Provider, MD  amLODipine (NORVASC) 10 MG tablet Take 5 mg by mouth daily.    Yes Historical Provider, MD  atenolol (TENORMIN) 50 MG tablet Take 50 mg by mouth daily.   Yes Historical Provider, MD  Cholecalciferol (VITAMIN D3) 5000 units CAPS Take 5,000 Units by mouth every evening.    Yes Historical Provider, MD  clopidogrel (PLAVIX) 75 MG tablet Take 75 mg by mouth daily.    Yes Historical Provider, MD  DULoxetine (CYMBALTA) 30 MG capsule Take 90 mg by mouth every evening.   Yes Historical Provider, MD  Ipratropium-Albuterol (COMBIVENT RESPIMAT) 20-100 MCG/ACT AERS respimat Take 1 puff by mouth every 6 (six) hours as needed for wheezing or shortness of breath.    Yes Historical Provider, MD  ipratropium-albuterol (DUONEB) 0.5-2.5 (3) MG/3ML SOLN Take 3 mLs by nebulization every 6 (six) hours as needed (for wheezing/shortness of breath).   Yes Historical Provider, MD  isosorbide mononitrate (IMDUR) 30 MG 24 hr tablet Take 30 mg by mouth 2 (two) times daily. 09/24/14  Yes Historical Provider, MD  lisinopril (PRINIVIL,ZESTRIL) 40 MG tablet Take 40 mg by mouth daily.   Yes Historical Provider, MD  LORazepam (ATIVAN) 0.5 MG tablet Take 0.5 mg by mouth 2 (two) times daily as needed for anxiety.    Yes Historical Provider, MD  pantoprazole (PROTONIX) 40 MG tablet Take 40 mg by mouth 2 (two) times daily.    Yes Historical Provider, MD  simvastatin (ZOCOR) 10 MG tablet Take 1 tablet (10 mg total) by mouth at bedtime. 01/04/14  Yes Ripudeep Krystal Eaton, MD  traMADol (ULTRAM) 50 MG tablet Take 50 mg by mouth every 6 (six) hours as needed for moderate pain.    Yes Historical Provider, MD    Physical Exam:    Constitutional: NAD, calm, comfortable Vitals:   01/30/17 1958  BP:  (!) 173/92  Pulse: 99  Resp: 18  Temp: 97.7 F (36.5 C)  TempSrc: Oral  SpO2: 98%   Eyes: PERRL, lids and conjunctivae normal ENMT: Mucous membranes are moist. Posterior pharynx clear of any exudate or lesions. Neck: normal, supple, no masses, no thyromegaly Respiratory: clear to auscultation bilaterally, no wheezing, no crackles. Normal respiratory effort. No accessory muscle use.  Cardiovascular: Regular rate and rhythm, no murmurs / rubs / gallops. No extremity edema. 2+ pedal pulses. No carotid bruits. Chest pain reproducible with palpation. Abdomen: no tenderness, no masses palpated. No hepatosplenomegaly. Bowel sounds positive.  Musculoskeletal: no clubbing / cyanosis. No joint deformity upper and lower extremities. Good ROM, no contractures. Normal muscle tone.  Skin: no rashes, lesions, ulcers. No induration Neurologic: CN 2-12 grossly intact. Sensation  intact, DTR normal. Strength 5/5 in all 4.  Psychiatric: Normal judgment and insight. Alert and oriented x 3. Normal mood.     Labs on Admission: I have personally reviewed following labs and imaging studies  CBC:  Recent Labs Lab 01/30/17 2008  WBC 10.8*  HGB 11.4*  HCT 34.1*  MCV 92.2  PLT 341   Basic Metabolic Panel:  Recent Labs Lab 01/30/17 2008  NA 134*  K 4.3  CL 99*  CO2 25  GLUCOSE 131*  BUN 19  CREATININE 1.36*  CALCIUM 9.7   GFR: CrCl cannot be calculated (Unknown ideal weight.). Liver Function Tests: No results for input(s): AST, ALT, ALKPHOS, BILITOT, PROT, ALBUMIN in the last 168 hours. No results for input(s): LIPASE, AMYLASE in the last 168 hours. No results for input(s): AMMONIA in the last 168 hours. Coagulation Profile: No results for input(s): INR, PROTIME in the last 168 hours. Cardiac Enzymes: No results for input(s): CKTOTAL, CKMB, CKMBINDEX, TROPONINI in the last 168 hours. BNP (last 3 results) No results for input(s): PROBNP in the last 8760 hours. HbA1C: No results for  input(s): HGBA1C in the last 72 hours. CBG: No results for input(s): GLUCAP in the last 168 hours. Lipid Profile: No results for input(s): CHOL, HDL, LDLCALC, TRIG, CHOLHDL, LDLDIRECT in the last 72 hours. Thyroid Function Tests: No results for input(s): TSH, T4TOTAL, FREET4, T3FREE, THYROIDAB in the last 72 hours. Anemia Panel: No results for input(s): VITAMINB12, FOLATE, FERRITIN, TIBC, IRON, RETICCTPCT in the last 72 hours. Urine analysis:    Component Value Date/Time   COLORURINE YELLOW 04/16/2016 1937   APPEARANCEUR CLOUDY (A) 04/16/2016 1937   LABSPEC 1.013 04/16/2016 1937   PHURINE 6.0 04/16/2016 1937   GLUCOSEU NEGATIVE 04/16/2016 1937   HGBUR NEGATIVE 04/16/2016 1937   BILIRUBINUR NEGATIVE 04/16/2016 1937   KETONESUR NEGATIVE 04/16/2016 1937   PROTEINUR NEGATIVE 04/16/2016 1937   UROBILINOGEN 0.2 01/14/2014 1505   NITRITE NEGATIVE 04/16/2016 1937   LEUKOCYTESUR NEGATIVE 04/16/2016 1937   Sepsis Labs: No results found for this or any previous visit (from the past 240 hour(s)).   Radiological Exams on Admission: Dg Chest 2 View  Result Date: 01/30/2017 CLINICAL DATA:  Initial evaluation for acute left chest pain since yesterday, shortness of breath. EXAM: CHEST  2 VIEW COMPARISON:  Prior radiograph from 01/06/2014. FINDINGS: Cardiac and mediastinal silhouettes are stable in size and contour, and remain within normal limits. Lungs are hypoinflated. Secondary mild bibasilar bronchovascular crowding/ atelectasis. No focal infiltrates. No pulmonary edema or pleural effusion. No pneumothorax. No acute osseus abnormality. IMPRESSION: 1. Shallow lung inflation with mild bibasilar atelectasis/bronchovascular crowding. 2. No other active cardiopulmonary disease. Electronically Signed   By: Jeannine Boga M.D.   On: 01/30/2017 20:52    EKG: Independently reviewed. RBBB  Assessment/Plan Chest pain: Heart score = 5. History of recent strenuous activity and reproducibility on  physical exam suggests more likely costochondritis. However in view EKG changes warrant further investigation. - will admit to Telemetry bed  - cycle CE q6 x3 and repeat her EKG in the am  - Nitroglycerin, Morphine, and aspirin  - Risk factor stratification: will check FLP and A1C  - 2d echo - please call Card in AM  New right bundle branch block - Follow-up echocardiogram  Leukocytosis: WBC elevated at 10.8. Unclear cause of symptoms. Chest x-ray. Showed no acute signs of - Continue to monitor  Essential hypertension - Continue atenolol, lisinopril, and amlodipine  H/O CVA and PVD - Continue Plavix and  aspirin   Chronic kidney disease stage III: Baseline creatinine noted to be 1.33 back in 2017 - IV Fluids NS at 75 ml/hr - Follow-up repeat BMP  COPD, without acute exacerbation - DuoNeb's prn SOB/Wheezing  Hyperlipidemia  - Continue simvastatin  - Follow-up FLP  Anemia of chronic disease: Stable Hemoglobin 11.4. - Continue to monitor  H/O Anxiety - Continue Ativan prn     DVT prophylaxis: lovenox   Code Status: Full  Family Communication: Discussed overall plan of care with the patient and family present at bedside  Disposition Plan: likely discharge home if medically stable  Consults called:  PCCM Admission status: Observation  Norval Morton MD [facility] Pager [phone]  If 7PM-7AM, please contact night-coverage [email or web address] Password [facility]  01/30/2017, 11:37 PM

## 2017-01-30 NOTE — ED Triage Notes (Signed)
Pt complains of left sided chest pain since yesterday. Pain is intermittent, pt does not have pain at this time. Pt states the pain started in her left neck yesterday and moved to her left breast. Pt has hx of COPD, states she does not feel more short of breath than usual.

## 2017-01-31 ENCOUNTER — Observation Stay (HOSPITAL_BASED_OUTPATIENT_CLINIC_OR_DEPARTMENT_OTHER): Payer: Medicare Other

## 2017-01-31 ENCOUNTER — Observation Stay (HOSPITAL_COMMUNITY): Payer: Medicare Other

## 2017-01-31 ENCOUNTER — Encounter (HOSPITAL_COMMUNITY): Payer: Self-pay | Admitting: *Deleted

## 2017-01-31 DIAGNOSIS — I200 Unstable angina: Secondary | ICD-10-CM | POA: Diagnosis not present

## 2017-01-31 DIAGNOSIS — Z8673 Personal history of transient ischemic attack (TIA), and cerebral infarction without residual deficits: Secondary | ICD-10-CM

## 2017-01-31 DIAGNOSIS — N189 Chronic kidney disease, unspecified: Secondary | ICD-10-CM | POA: Diagnosis present

## 2017-01-31 DIAGNOSIS — I4510 Unspecified right bundle-branch block: Secondary | ICD-10-CM | POA: Diagnosis not present

## 2017-01-31 DIAGNOSIS — D72829 Elevated white blood cell count, unspecified: Secondary | ICD-10-CM | POA: Diagnosis present

## 2017-01-31 DIAGNOSIS — I10 Essential (primary) hypertension: Secondary | ICD-10-CM | POA: Diagnosis not present

## 2017-01-31 DIAGNOSIS — R079 Chest pain, unspecified: Secondary | ICD-10-CM

## 2017-01-31 DIAGNOSIS — E785 Hyperlipidemia, unspecified: Secondary | ICD-10-CM | POA: Diagnosis not present

## 2017-01-31 DIAGNOSIS — J430 Unilateral pulmonary emphysema [MacLeod's syndrome]: Secondary | ICD-10-CM | POA: Diagnosis not present

## 2017-01-31 DIAGNOSIS — D631 Anemia in chronic kidney disease: Secondary | ICD-10-CM | POA: Diagnosis present

## 2017-01-31 DIAGNOSIS — J449 Chronic obstructive pulmonary disease, unspecified: Secondary | ICD-10-CM | POA: Diagnosis not present

## 2017-01-31 DIAGNOSIS — K219 Gastro-esophageal reflux disease without esophagitis: Secondary | ICD-10-CM | POA: Diagnosis not present

## 2017-01-31 LAB — TROPONIN I: Troponin I: <0.03 ng/mL (ref <0.03)

## 2017-01-31 LAB — D-DIMER, QUANTITATIVE: D-Dimer, Quant: 0.55 ug/mL-FEU — ABNORMAL HIGH (ref 0.00–0.50)

## 2017-01-31 MED ORDER — TECHNETIUM TO 99M ALBUMIN AGGREGATED
4.3500 | Freq: Once | INTRAVENOUS | Status: AC | PRN
  Administered 2017-01-31: 4.35 via INTRAVENOUS

## 2017-01-31 MED ORDER — HYDRALAZINE HCL 20 MG/ML IJ SOLN
10.0000 mg | INTRAMUSCULAR | Status: DC | PRN
  Administered 2017-01-31: 10 mg via INTRAVENOUS
  Filled 2017-01-31: qty 1

## 2017-01-31 MED ORDER — IPRATROPIUM-ALBUTEROL 0.5-2.5 (3) MG/3ML IN SOLN: 3.0000 mL | Freq: Three times a day (TID) | RESPIRATORY_TRACT | Status: DC

## 2017-01-31 MED ORDER — ASPIRIN 325 MG PO TABS
325.0000 mg | ORAL_TABLET | Freq: Every day | ORAL | Status: DC
  Administered 2017-01-31: 325 mg via ORAL
  Filled 2017-01-31: qty 1

## 2017-01-31 MED ORDER — ATENOLOL 100 MG PO TABS: 100.0000 mg | ORAL_TABLET | Freq: Every day | ORAL | 1 refills | Status: DC

## 2017-01-31 MED ORDER — DULOXETINE HCL 60 MG PO CPEP
90.0000 mg | ORAL_CAPSULE | Freq: Every evening | ORAL | Status: DC
  Administered 2017-01-31 (×2): 90 mg via ORAL
  Filled 2017-01-31: qty 1

## 2017-01-31 MED ORDER — IPRATROPIUM-ALBUTEROL 0.5-2.5 (3) MG/3ML IN SOLN
3.0000 mL | Freq: Four times a day (QID) | RESPIRATORY_TRACT | Status: DC
  Administered 2017-01-31: 3 mL via RESPIRATORY_TRACT
  Filled 2017-01-31 (×2): qty 3

## 2017-01-31 MED ORDER — RACEPINEPHRINE HCL 2.25 % IN NEBU: 0.5000 mL | INHALATION_SOLUTION | RESPIRATORY_TRACT | Status: DC

## 2017-01-31 MED ORDER — TECHNETIUM TC 99M DIETHYLENETRIAME-PENTAACETIC ACID
30.7000 | Freq: Once | INTRAVENOUS | Status: AC | PRN
  Administered 2017-01-31: 30.7 via RESPIRATORY_TRACT

## 2017-01-31 MED ORDER — ATENOLOL 25 MG PO TABS: 100.0000 mg | ORAL_TABLET | Freq: Every day | ORAL | Status: DC

## 2017-01-31 NOTE — Consult Note (Signed)
Cardiology Consult    Patient ID: Shannon Roberts MRN: [id number], DOB/AGE: 24-May-1951   Admit date: 01/30/2017 Date of Consult: 01/31/2017  Primary Physician: Melinda Crutch, MD Reason for Consult: Chest Pain Primary Cardiologist: Dr. Tollie Eth - [facility] Covering Today Requesting Provider: Dr. Allyson Sabal   History of Present Illness    Shannon Roberts is a 66 y.o. female with past medical history of HTN, HLD, COPD, carotid artery disease (s/p R CEA in 2015), and prior CVA who is being seen today for the evaluation of chest pain at the request of Dr. Allyson Sabal.   She presented to [facility] ED on 01/30/2017 and reported having a sharp, left-sided chest pain which was intermittent since initial onset on 4/11. She reports cleaning carpets the day prior and using her arms extensively to scrub. On 4/11 she noted a sharp pain along her left shoulder and radiating into the left breast. This resolved within a few minutes. She was able to carry out her daily activities (including climbing stairs and walking around the grocery store). Her pain represented on 4/12 and was intermittent throughout the day. Denies any associated dyspnea, nausea, vomiting, or diaphoresis. Her pain was not worse with exertion or positional changes.   Initial labs show WBC of 10.8, Hgb 11.4, and platelets 270. Na+ 134, K+ 4.3, creatinine 1.36. Initial troponin negative. CXR with atelectasis/bronchovascular crowding but no active cardiopulmonary disease. EKG shows SR with PVC's, HR 92, and RBBB (new since 03/2016).    At the time of this encounter, she denies any active chest pain. Is resting comfortably in bed.  She is followed by Dr. Wynonia Lawman and reports having a history of MVP noted on prior echocardiograms. Had a NST in 2015 which she reports was "normal". No known history of CAD.   Past Medical History   Past Medical History:  Diagnosis Date  . Anemia    per hosp. - recently- pt. told that she is anemic  . Anxiety   .  Colitis   . COPD (chronic obstructive pulmonary disease) (HCC)    no o2  . Depression   . Diverticulosis   . Female bladder prolapse   . Fibroids   . GERD (gastroesophageal reflux disease)   . Heart murmur   . Hyperlipemia   . Hypertension   . IBS (irritable bowel syndrome)   . Insomnia   . Mitral valve prolapse   . Peripheral vascular disease ([place])   . Sciatic nerve disease   . Shortness of breath   . Stroke [facility]) March 2015    Past Surgical History:  Procedure Laterality Date  . CAROTID ENDARTERECTOMY Right January 17, 2014   CE  . CHOLECYSTECTOMY    . COLONOSCOPY    . ENDARTERECTOMY Right 01/17/2014   Procedure: RIGHT CAROTID ARTERY ENDARTERECTOMY WITH DACRON PATCH ANGIOPLASTY;  Surgeon: Rosetta Posner, MD;  Location: [facility] OR;  Service: Vascular;  Laterality: Right;  . UPPER GASTROINTESTINAL ENDOSCOPY       Allergies  Allergies  Allergen Reactions  . Carvedilol Palpitations  . Dapsone Other (See Comments)    Reaction:  Flushing   . Niacin And Related Other (See Comments)    Reaction:  Flushing   . Amoxicillin Rash and Other (See Comments)    Has patient had a PCN reaction causing immediate rash, facial/tongue/throat swelling, SOB or lightheadedness with hypotension: No Has patient had a PCN reaction causing severe rash involving mucus membranes or skin necrosis: No Has patient had a PCN  reaction that required hospitalization No Has patient had a PCN reaction occurring within the last 10 years: No If all of the above answers are "NO", then may proceed with Cephalosporin use.    Inpatient Medications    . amLODipine  5 mg Oral Daily  . aspirin  325 mg Oral Daily  . atenolol  50 mg Oral Daily  . clopidogrel  75 mg Oral Daily  . DULoxetine  90 mg Oral QPM  . enoxaparin (LOVENOX) injection  40 mg Subcutaneous QHS  . isosorbide mononitrate  30 mg Oral BID  . lisinopril  40 mg Oral Daily  . pantoprazole  40 mg Oral BID  . simvastatin  10 mg Oral QHS    Family History     Family History  Problem Relation Age of Onset  . Breast cancer Mother   . Ovarian cancer Mother   . Cancer Mother   . AAA (abdominal aortic aneurysm) Mother   . Cancer Father   . Colon cancer Paternal Aunt   . Pancreatic cancer Maternal Grandmother   . Diabetes Maternal Aunt   . Heart disease Paternal Grandfather   . Kidney disease Maternal Aunt     Social History    Social History   Social History  . Marital status: Divorced    Spouse name: N/A  . Number of children: 2  . Years of education: ASSOCIATES   Occupational History  . Bookeeper    Social History Main Topics  . Smoking status: Former Smoker    Packs/day: 1.00    Years: 40.00    Types: Cigarettes    Quit date: 01/02/2014  . Smokeless tobacco: Never Used  . Alcohol use No  . Drug use: No  . Sexual activity: Not Currently   Other Topics Concern  . Not on file   Social History Narrative   Patient is divorced with 2 children.   Patient is right handed.   Patient has her Associates degree.   Patient drinks 3 cups daily.     Review of Systems    General:  No chills, fever, night sweats or weight changes.  Cardiovascular:  No dyspnea on exertion, edema, orthopnea, palpitations, paroxysmal nocturnal dyspnea. Positive for chest pain.  Dermatological: No rash, lesions/masses Respiratory: No cough, dyspnea Urologic: No hematuria, dysuria Abdominal:   No nausea, vomiting, diarrhea, bright red blood per rectum, melena, or hematemesis Neurologic:  No visual changes, wkns, changes in mental status. All other systems reviewed and are otherwise negative except as noted above.  Physical Exam    Blood pressure (!) 144/88, pulse 97, temperature 97.4 F (36.3 C), temperature source Oral, resp. rate 19, weight 203 lb (92.1 kg), SpO2 100 %.  General: Pleasant overweight Caucasian female appearing in NAD. Psych: Normal affect. Neuro: Alert and oriented X 3. Moves all extremities spontaneously. HEENT:  Normal  Neck: Supple without bruits or JVD. Lungs:  Resp regular and unlabored, CTA without wheezing or rales. Heart: RRR no s3, s4, or murmurs. Tender to palpation along left pectoral region.  Abdomen: Soft, non-tender, non-distended, BS + x 4.  Extremities: No clubbing, cyanosis or edema. DP/PT/Radials 2+ and equal bilaterally.  Labs    Troponin [facility] of Care Test)  Recent Labs  01/30/17 2029  TROPIPOC 0.00   No results for input(s): CKTOTAL, CKMB, TROPONINI in the last 72 hours. Lab Results  Component Value Date   WBC 10.8 (H) 01/30/2017   HGB 11.4 (L) 01/30/2017   HCT 34.1 (L) 01/30/2017  MCV 92.2 01/30/2017   PLT 270 01/30/2017    Recent Labs Lab 01/30/17 2008  NA 134*  K 4.3  CL 99*  CO2 25  BUN 19  CREATININE 1.36*  CALCIUM 9.7  GLUCOSE 131*   Lab Results  Component Value Date   CHOL 158 01/02/2014   HDL 47 01/02/2014   LDLCALC 91 01/02/2014   TRIG 99 01/02/2014   No results found for: [facility]   Radiology Studies    Dg Chest 2 View  Result Date: 01/30/2017 CLINICAL DATA:  Initial evaluation for acute left chest pain since yesterday, shortness of breath. EXAM: CHEST  2 VIEW COMPARISON:  Prior radiograph from 01/06/2014. FINDINGS: Cardiac and mediastinal silhouettes are stable in size and contour, and remain within normal limits. Lungs are hypoinflated. Secondary mild bibasilar bronchovascular crowding/ atelectasis. No focal infiltrates. No pulmonary edema or pleural effusion. No pneumothorax. No acute osseus abnormality. IMPRESSION: 1. Shallow lung inflation with mild bibasilar atelectasis/bronchovascular crowding. 2. No other active cardiopulmonary disease. Electronically Signed   By: Jeannine Boga M.D.   On: 01/30/2017 20:52    EKG & Cardiac Imaging    EKG: SR with PVC's, HR 92, and RBBB (new since 03/2016) - Personally Reviewed  Echocardiogram: 12/2013  Study Conclusions  Left ventricle: There was an increased relative contribution of  atrial contraction to ventricular filling.  Impressions:  - This study is not readable.  Assessment & Plan    1. Atypical Chest Pain - the patient developed a sharp, left-sided chest pain which has been intermittent since initial onset on 4/11, initially occurring after using her arms extensively to scrub the floor. She denies any associated symptoms and pain is not worse on exertion. Has been relieved with ice and massage.  - her pain is reproducible on palpation.  -  initial troponin negative. EKG shows SR with PVC's, HR 92, and RBBB (new since 03/2016).  Will obtain repeat troponin value now (unclear as to why cyclic values have not been obtained).  - she has no known CAD or prior MI's with a normal NST in 2015 by her report. If repeat troponin negative, would not pursue further ischemic evaluation at this time as her symptoms seem most consistent with a MSK etiology.   2. HTN - BP initially elevated to 173/92 on arrival, improved to 144/88 on most recent check.  - continue Amlodipine 5mg  daily, Atenolol 50mg  daily, Imdur 30mg  BID, and Lisinopril 40mg  daily.   3. HLD - followed by Dr. Wynonia Lawman.  - continue Simvastatin 10mg  daily.   4. Carotid Artery Disease - s/p R CEA in 2015. - continue Plavix and statin therapy.   5. COPD - per admitting team  Signed, Erma Heritage, PA-C 01/31/2017, 6:55 AM Pager: [phone]  Patient examined chart reviewed. Discussed care with patient and PA Somewhat histriionic patient of Dr Wynonia Lawman Aytpical chest pain clearly reproduce able below left breast. istat troponin negative cycling more. ECG nonspeciific Findings and RBBB not significant Continue atenolol consider increasing dose to 100 mg given relative tachycardia Ok to d/c home with NSAI Rx and outpatient f/u with Dr Wynonia Lawman for possible lexiscan myovue  Jenkins [place]

## 2017-01-31 NOTE — Discharge Summary (Addendum)
Physician Discharge Summary  Shannon Roberts MRN: [id number] DOB/AGE: [birth year] 66 y.o.  PCP: Melinda Crutch, MD   Admit date: 01/30/2017 Discharge date: 01/31/2017  Discharge Diagnoses:    Principal Problem:   Chest pain Active Problems:   COPD (chronic obstructive pulmonary disease) (HCC)   HTN (hypertension), malignant   RBBB   History of CVA (cerebrovascular accident)   Anemia due to chronic kidney disease   Leukocytosis    Follow-up recommendations Follow-up with PCP in 3-5 days , including all  additional recommended appointments as below Follow-up CBC, CMP in 3-5 days f/u with Dr Wynonia Lawman for possible lexiscan myoview and outpatient echo       Current Discharge Medication List    CONTINUE these medications which have CHANGED   Details  atenolol (TENORMIN) 100 MG tablet Take 1 tablet (100 mg total) by mouth daily. Qty: 30 tablet, Refills: 1      CONTINUE these medications which have NOT CHANGED   Details  acetaminophen (TYLENOL) 500 MG tablet Take 1,000 mg by mouth every 6 (six) hours as needed for mild pain, moderate pain, fever or headache.     amLODipine (NORVASC) 10 MG tablet Take 5 mg by mouth daily.     Cholecalciferol (VITAMIN D3) 5000 units CAPS Take 5,000 Units by mouth every evening.     clopidogrel (PLAVIX) 75 MG tablet Take 75 mg by mouth daily.     DULoxetine (CYMBALTA) 30 MG capsule Take 90 mg by mouth every evening.    Ipratropium-Albuterol (COMBIVENT RESPIMAT) 20-100 MCG/ACT AERS respimat Take 1 puff by mouth every 6 (six) hours as needed for wheezing or shortness of breath.     ipratropium-albuterol (DUONEB) 0.5-2.5 (3) MG/3ML SOLN Take 3 mLs by nebulization every 6 (six) hours as needed (for wheezing/shortness of breath).    isosorbide mononitrate (IMDUR) 30 MG 24 hr tablet Take 30 mg by mouth 2 (two) times daily. Refills: 0    lisinopril (PRINIVIL,ZESTRIL) 40 MG tablet Take 40 mg by mouth daily.    LORazepam (ATIVAN) 0.5 MG tablet Take  0.5 mg by mouth 2 (two) times daily as needed for anxiety.  Refills: 0    pantoprazole (PROTONIX) 40 MG tablet Take 40 mg by mouth 2 (two) times daily.     simvastatin (ZOCOR) 10 MG tablet Take 1 tablet (10 mg total) by mouth at bedtime. Qty: 30 tablet, Refills: 3    traMADol (ULTRAM) 50 MG tablet Take 50 mg by mouth every 6 (six) hours as needed for moderate pain.          Discharge Condition: Stable   Discharge Instructions Get Medicines reviewed and adjusted: Please take all your medications with you for your next visit with your Primary MD  Please request your Primary MD to go over all hospital tests and procedure/radiological results at the follow up, please ask your Primary MD to get all Hospital records sent to his/her office.  If you experience worsening of your admission symptoms, develop shortness of breath, life threatening emergency, suicidal or homicidal thoughts you must seek medical attention immediately by calling 911 or calling your MD immediately if symptoms less severe.  You must read complete instructions/literature along with all the possible adverse reactions/side effects for all the Medicines you take and that have been prescribed to you. Take any new Medicines after you have completely understood and accpet all the possible adverse reactions/side effects.   Do not drive when taking Pain medications.   Do not take more than prescribed  Pain, Sleep and Anxiety Medications  Special Instructions: If you have smoked or chewed Tobacco in the last 2 yrs please stop smoking, stop any regular Alcohol and or any Recreational drug use.  Wear Seat belts while driving.  Please note  You were cared for by a hospitalist during your hospital stay. Once you are discharged, your primary care physician will handle any further medical issues. Please note that NO REFILLS for any discharge medications will be authorized once you are discharged, as it is imperative that you  return to your primary care physician (or establish a relationship with a primary care physician if you do not have one) for your aftercare needs so that they can reassess your need for medications and monitor your lab values.     Allergies  Allergen Reactions  . Carvedilol Palpitations  . Dapsone Other (See Comments)    Reaction:  Flushing   . Niacin And Related Other (See Comments)    Reaction:  Flushing   . Amoxicillin Rash and Other (See Comments)    Has patient had a PCN reaction causing immediate rash, facial/tongue/throat swelling, SOB or lightheadedness with hypotension: No Has patient had a PCN reaction causing severe rash involving mucus membranes or skin necrosis: No Has patient had a PCN reaction that required hospitalization No Has patient had a PCN reaction occurring within the last 10 years: No If all of the above answers are "NO", then may proceed with Cephalosporin use.      Disposition: 01-Home or Self Care   Consults: Cardiology    Significant Diagnostic Studies:  Dg Chest 2 View  Result Date: 01/30/2017 CLINICAL DATA:  Initial evaluation for acute left chest pain since yesterday, shortness of breath. EXAM: CHEST  2 VIEW COMPARISON:  Prior radiograph from 01/06/2014. FINDINGS: Cardiac and mediastinal silhouettes are stable in size and contour, and remain within normal limits. Lungs are hypoinflated. Secondary mild bibasilar bronchovascular crowding/ atelectasis. No focal infiltrates. No pulmonary edema or pleural effusion. No pneumothorax. No acute osseus abnormality. IMPRESSION: 1. Shallow lung inflation with mild bibasilar atelectasis/bronchovascular crowding. 2. No other active cardiopulmonary disease. Electronically Signed   By: Jeannine Boga M.D.   On: 01/30/2017 20:52    echocardiogram       Filed Weights   01/31/17 0300  Weight: 92.1 kg (203 lb)     Microbiology: No results found for this or any previous visit (from the past 240 hour(s)).      Blood Culture No results found for: SDES, [place], CULT, REPTSTATUS    Labs: Results for orders placed or performed during the hospital encounter of 01/30/17 (from the past 48 hour(s))  Basic metabolic panel     Status: Abnormal   Collection Time: 01/30/17  8:08 PM  Result Value Ref Range   Sodium 134 (L) 135 - 145 mmol/L   Potassium 4.3 3.5 - 5.1 mmol/L   Chloride 99 (L) 101 - 111 mmol/L   CO2 25 22 - 32 mmol/L   Glucose, Bld 131 (H) 65 - 99 mg/dL   BUN 19 6 - 20 mg/dL   Creatinine, Ser 1.36 (H) 0.44 - 1.00 mg/dL   Calcium 9.7 8.9 - 10.3 mg/dL   GFR calc non Af Amer 39 (L) >60 mL/min   GFR calc Af Amer 46 (L) >60 mL/min    Comment: (NOTE) The eGFR has been calculated using the CKD EPI equation. This calculation has not been validated in all clinical situations. eGFR's persistently <60 mL/min signify possible Chronic Kidney  Disease.    Anion gap 10 5 - 15  CBC     Status: Abnormal   Collection Time: 01/30/17  8:08 PM  Result Value Ref Range   WBC 10.8 (H) 4.0 - 10.5 K/uL   RBC 3.70 (L) 3.87 - 5.11 MIL/uL   Hemoglobin 11.4 (L) 12.0 - 15.0 g/dL   HCT 34.1 (L) 36.0 - 46.0 %   MCV 92.2 78.0 - 100.0 fL   MCH 30.8 26.0 - 34.0 pg   MCHC 33.4 30.0 - 36.0 g/dL   RDW 12.8 11.5 - 15.5 %   Platelets 270 150 - 400 K/uL  I-stat troponin, ED     Status: None   Collection Time: 01/30/17  8:29 PM  Result Value Ref Range   Troponin i, poc 0.00 0.00 - 0.08 ng/mL   Comment 3            Comment: Due to the release kinetics of cTnI, a negative result within the first hours of the onset of symptoms does not rule out myocardial infarction with certainty. If myocardial infarction is still suspected, repeat the test at appropriate intervals.   Troponin I     Status: None   Collection Time: 01/31/17  8:24 AM  Result Value Ref Range   Troponin I <0.03 <0.03 ng/mL  D-dimer, quantitative (not at [facility])     Status: Abnormal   Collection Time: 01/31/17 10:49 AM  Result Value Ref  Range   D-Dimer, Quant 0.55 (H) 0.00 - 0.50 ug/mL-FEU    Comment: (NOTE) At the manufacturer cut-off of 0.50 ug/mL FEU, this assay has been documented to exclude PE with a sensitivity and negative predictive value of 97 to 99%.  At this time, this assay has not been approved by the FDA to exclude DVT/VTE. Results should be correlated with clinical presentation.      Lipid Panel     Component Value Date/Time   CHOL 158 01/02/2014 2255   TRIG 99 01/02/2014 2255   HDL 47 01/02/2014 2255   CHOLHDL 3.4 01/02/2014 2255   VLDL 20 01/02/2014 2255   LDLCALC 91 01/02/2014 2255     Lab Results  Component Value Date   HGBA1C 6.0 (H) 01/02/2014     Lab Results  Component Value Date   LDLCALC 91 01/02/2014   CREATININE 1.36 (H) 01/30/2017     HPI :  Shannon Roberts is a 66 y.o. female with past medical history of HTN, HLD, COPD, carotid artery disease (s/p R CEA in 2015), and prior CVA who is being seen today for the evaluation of chest pain  . She presented to [facility] ED on 01/30/2017 and reported having a sharp, left-sided chest pain which was intermittent since initial onset on 4/11. She reports cleaning carpets the day prior and using her arms extensively to scrub. On 4/11 she noted a sharp pain along her left shoulder and radiating into the left breast. This resolved within a few minutes. She was able to carry out her daily activities (including climbing stairs and walking around the grocery store). Her pain represented on 4/12 and was intermittent throughout the day. Denies any associated dyspnea, nausea, vomiting, or diaphoresis. Her pain was not worse with exertion or positional changes.   Initial labs show WBC of 10.8, Hgb 11.4, and platelets 270. Na+ 134, K+ 4.3, creatinine 1.36. Initial troponin negative. CXR with atelectasis/bronchovascular crowding but no active cardiopulmonary disease. EKG shows SR with PVC's, HR 92, and RBBB (new since 03/2016).  She is followed by  Dr. Wynonia Lawman and reports having a history of MVP noted on prior echocardiograms. Had a NST in 2015 which she reports was "normal". No known history of CAD.  HOSPITAL COURSE:   Chest pain: Heart score = 5. History of recent strenuous activity and reproducibility on physical exam suggests more likely costochondritis. However in view EKG changes warrant further investigation. Telemetry uneventful Cardiac enzymes negative, d-dimer 0.55  SR with PVC's, HR 92, and RBBB (new since 03/2016)  - Nitroglycerin, Morphine, and aspirin  the patient developed a sharp, left-sided chest pain which has been intermittent since initial onset on 4/11, initially occurring after using her arms extensively to scrub the floor. She denies any associated symptoms and pain is not worse on exertion. Has been relieved with ice and massage. Cardiology increase patient's atenolol to 100 mg a day due to relative tachycardia Ok to d/c home with NSAI Rx and outpatient f/u with Dr Wynonia Lawman for possible lexiscan myovue - 2d echo could not be completed as inpatient and needs to be done as an outpatient   Cardiology felt pain was more likely secondary to costochondritis  Abnormal d-dimer VQ scan and vascular Doppler of bilateral lower extremities, was negative    New right bundle branch block - Follow-up echocardiogram  Leukocytosis: WBC elevated at 10.8. Unclear cause of symptoms. Chest x-ray. Showed no acute signs of - Continue to monitor  Essential hypertension - Continue atenolol, lisinopril, and amlodipine  H/O CVA and PVD - Continue Plavix    Chronic kidney disease stage III: Baseline creatinine noted to be 1.33 back in 2017 Creatinine remained at baseline    COPD, without acute exacerbation - DuoNeb's prn SOB/Wheezing  Hyperlipidemia  - Continue simvastatin  Lipid panel pending at the time of discharge  Anemia of chronic disease: Stable Hemoglobin 11.4. - Continue to monitor  H/O Anxiety - Continue  Ativan prn     Discharge Exam:   Blood pressure 135/69, pulse 90, temperature 97.4 F (36.3 C), temperature source Oral, resp. rate 19, height '5\' 5"'  (1.651 m), weight 92.1 kg (203 lb), SpO2 97 %.  General: Pleasant overweight Caucasian female appearing in NAD. Psych: Normal affect. Neuro: Alert and oriented X 3. Moves all extremities spontaneously. HEENT: Normal           Neck: Supple without bruits or JVD. Lungs:  Resp regular and unlabored, CTA without wheezing or rales. Heart: RRR no s3, s4, or murmurs. Tender to palpation along left pectoral region.  Abdomen: Soft, non-tender, non-distended, BS + x 4.  Extremities: No clubbing, cyanosis or edema. DP/PT/Radials 2+ and equal bilaterally    Follow-up Information    Melinda Crutch, MD. Call.   Specialty:  Family Medicine Why:  Hospital follow-up in 3-5 days. PCP to arrange for outpatient Myoview Contact information: [place] [id number] [phone]           Signed: Reyne Dumas 01/31/2017, 11:57 AM        Time spent >45 mins

## 2017-01-31 NOTE — Progress Notes (Signed)
*  PRELIMINARY RESULTS* Vascular Ultrasound Lower extremity venous duplex has been completed.  Preliminary findings: No evidence of DVT or baker's cyst.   Landry Mellow, RDMS, RVT  01/31/2017, 2:12 PM

## 2017-02-06 DIAGNOSIS — J449 Chronic obstructive pulmonary disease, unspecified: Secondary | ICD-10-CM | POA: Diagnosis not present

## 2017-02-06 DIAGNOSIS — D729 Disorder of white blood cells, unspecified: Secondary | ICD-10-CM | POA: Diagnosis not present

## 2017-02-06 DIAGNOSIS — E119 Type 2 diabetes mellitus without complications: Secondary | ICD-10-CM | POA: Diagnosis not present

## 2017-02-06 DIAGNOSIS — Z0000 Encounter for general adult medical examination without abnormal findings: Secondary | ICD-10-CM | POA: Diagnosis not present

## 2017-03-04 DIAGNOSIS — I119 Hypertensive heart disease without heart failure: Secondary | ICD-10-CM | POA: Diagnosis not present

## 2017-03-04 DIAGNOSIS — I4510 Unspecified right bundle-branch block: Secondary | ICD-10-CM | POA: Diagnosis not present

## 2017-03-04 DIAGNOSIS — R0789 Other chest pain: Secondary | ICD-10-CM | POA: Diagnosis not present

## 2017-03-04 DIAGNOSIS — E668 Other obesity: Secondary | ICD-10-CM | POA: Diagnosis not present

## 2017-03-05 ENCOUNTER — Encounter: Payer: Self-pay | Admitting: Gynecology

## 2017-03-13 DIAGNOSIS — I119 Hypertensive heart disease without heart failure: Secondary | ICD-10-CM | POA: Diagnosis not present

## 2017-03-13 DIAGNOSIS — I4510 Unspecified right bundle-branch block: Secondary | ICD-10-CM | POA: Diagnosis not present

## 2017-03-13 DIAGNOSIS — R0789 Other chest pain: Secondary | ICD-10-CM | POA: Diagnosis not present

## 2017-03-13 DIAGNOSIS — E668 Other obesity: Secondary | ICD-10-CM | POA: Diagnosis not present

## 2017-03-21 ENCOUNTER — Institutional Professional Consult (permissible substitution): Payer: Medicare Other | Admitting: Internal Medicine

## 2017-04-01 ENCOUNTER — Ambulatory Visit: Payer: Medicare Other | Admitting: Vascular Surgery

## 2017-04-01 ENCOUNTER — Encounter (HOSPITAL_COMMUNITY): Payer: Medicare Other

## 2017-04-13 DIAGNOSIS — R69 Illness, unspecified: Secondary | ICD-10-CM | POA: Diagnosis not present

## 2017-04-14 DIAGNOSIS — R079 Chest pain, unspecified: Secondary | ICD-10-CM | POA: Diagnosis not present

## 2017-04-22 DIAGNOSIS — R6884 Jaw pain: Secondary | ICD-10-CM | POA: Diagnosis not present

## 2017-04-25 ENCOUNTER — Institutional Professional Consult (permissible substitution): Payer: Medicare Other | Admitting: Pulmonary Disease

## 2017-06-02 ENCOUNTER — Encounter: Payer: Self-pay | Admitting: Vascular Surgery

## 2017-06-10 ENCOUNTER — Ambulatory Visit: Payer: Medicare Other | Admitting: Vascular Surgery

## 2017-06-10 ENCOUNTER — Encounter (HOSPITAL_COMMUNITY): Payer: Medicare Other

## 2017-06-17 ENCOUNTER — Institutional Professional Consult (permissible substitution): Payer: Medicare Other | Admitting: Pulmonary Disease

## 2017-06-18 DIAGNOSIS — J449 Chronic obstructive pulmonary disease, unspecified: Secondary | ICD-10-CM | POA: Diagnosis not present

## 2017-07-17 DIAGNOSIS — J449 Chronic obstructive pulmonary disease, unspecified: Secondary | ICD-10-CM | POA: Diagnosis not present

## 2017-07-30 ENCOUNTER — Ambulatory Visit (INDEPENDENT_AMBULATORY_CARE_PROVIDER_SITE_OTHER): Payer: Medicare Other | Admitting: Pulmonary Disease

## 2017-07-30 ENCOUNTER — Institutional Professional Consult (permissible substitution): Payer: Medicare Other | Admitting: Pulmonary Disease

## 2017-07-30 ENCOUNTER — Encounter: Payer: Self-pay | Admitting: Pulmonary Disease

## 2017-07-30 ENCOUNTER — Other Ambulatory Visit: Payer: Medicare Other

## 2017-07-30 VITALS — BP 146/90 | HR 94 | Ht 65.0 in | Wt 206.6 lb

## 2017-07-30 DIAGNOSIS — J449 Chronic obstructive pulmonary disease, unspecified: Secondary | ICD-10-CM | POA: Diagnosis not present

## 2017-07-30 DIAGNOSIS — J309 Allergic rhinitis, unspecified: Secondary | ICD-10-CM

## 2017-07-30 DIAGNOSIS — J410 Simple chronic bronchitis: Secondary | ICD-10-CM

## 2017-07-30 DIAGNOSIS — Z23 Encounter for immunization: Secondary | ICD-10-CM

## 2017-07-30 DIAGNOSIS — K219 Gastro-esophageal reflux disease without esophagitis: Secondary | ICD-10-CM

## 2017-07-30 NOTE — Progress Notes (Signed)
Subjective:    Patient ID: Shannon Roberts, female    DOB: 1951-06-25, 66 y.o.   MRN: [id number]  HPI She reports that she had a breathing test in the 1990s at her PCP's office and was diagnosed with COPD at that time. She denies any breathing problems, asthma or allergies as a child. She reports as an adult she gets bronchitis up to twice per year. She denies any prior history of pneumonia. Currently she is on Duonebs and Combivent. She has also been on Asmanex in the past during an acute bronchitis episode. Previously has also been on ProAir. She reports she uses her Duoneb twice daily interspersed by her Combivent. She reports more dyspnea with exertion. She does have associated wheezing. She denies any chronic cough. She does have some chest tightness but no pain or pressure. She does have seasonal allergies with sinus congestion. She does take Protonix that controls her reflux & dyspepsia. No joint swelling, stiffness, or erythema. She does snore and gets up frequently though the night to dose her pet's medication. She reports poor quality of sleep. She generally wakes up with a headache & does nap frequently.   Review of Systems No rashes or abnormal bruising recently. She has had hives at times. No dysuria or hematuria. A pertinent 14 point review of systems is negative except as per the history of presenting illness.  Allergies  Allergen Reactions  . Carvedilol Palpitations  . Dapsone Other (See Comments)    Reaction:  Flushing   . Niacin And Related Other (See Comments)    Reaction:  Flushing   . Septra [Sulfamethoxazole-Trimethoprim]     Nausea, vomiting, diarrhea  . Wellbutrin [Bupropion]     Caused jittery   . Amoxicillin Rash and Other (See Comments)    Has patient had a PCN reaction causing immediate rash, facial/tongue/throat swelling, SOB or lightheadedness with hypotension: No Has patient had a PCN reaction causing severe rash involving mucus membranes or skin necrosis:  No Has patient had a PCN reaction that required hospitalization No Has patient had a PCN reaction occurring within the last 10 years: No If all of the above answers are "NO", then may proceed with Cephalosporin use.    Current Outpatient Prescriptions on File Prior to Visit  Medication Sig Dispense Refill  . acetaminophen (TYLENOL) 500 MG tablet Take 1,000 mg by mouth every 6 (six) hours as needed for mild pain, moderate pain, fever or headache.     Marland Kitchen amLODipine (NORVASC) 10 MG tablet Take 5 mg by mouth daily.     Marland Kitchen atenolol (TENORMIN) 100 MG tablet Take 1 tablet (100 mg total) by mouth daily. 30 tablet 1  . Cholecalciferol (VITAMIN D3) 5000 units CAPS Take 5,000 Units by mouth every evening.     . clopidogrel (PLAVIX) 75 MG tablet Take 75 mg by mouth daily.     . DULoxetine (CYMBALTA) 30 MG capsule Take 90 mg by mouth every evening.    . Ipratropium-Albuterol (COMBIVENT RESPIMAT) 20-100 MCG/ACT AERS respimat Take 1 puff by mouth every 6 (six) hours as needed for wheezing or shortness of breath.     Marland Kitchen ipratropium-albuterol (DUONEB) 0.5-2.5 (3) MG/3ML SOLN Take 3 mLs by nebulization every 6 (six) hours as needed (for wheezing/shortness of breath).    . isosorbide mononitrate (IMDUR) 30 MG 24 hr tablet Take 30 mg by mouth 2 (two) times daily.  0  . lisinopril (PRINIVIL,ZESTRIL) 40 MG tablet Take 40 mg by mouth daily.    Marland Kitchen  LORazepam (ATIVAN) 0.5 MG tablet Take 0.5 mg by mouth 2 (two) times daily as needed for anxiety.   0  . pantoprazole (PROTONIX) 40 MG tablet Take 40 mg by mouth 2 (two) times daily.     . simvastatin (ZOCOR) 10 MG tablet Take 1 tablet (10 mg total) by mouth at bedtime. 30 tablet 3  . traMADol (ULTRAM) 50 MG tablet Take 50 mg by mouth every 6 (six) hours as needed for moderate pain.      No current facility-administered medications on file prior to visit.     Past Medical History:  Diagnosis Date  . Allergic rhinitis   . Anemia    per hosp. - recently- pt. told that she  is anemic  . Anxiety   . Colitis   . COPD (chronic obstructive pulmonary disease) (HCC)    no o2  . Depression   . Diverticulosis   . Female bladder prolapse   . Fibroids   . GERD (gastroesophageal reflux disease)   . Heart murmur   . Hyperlipemia   . Hypertension   . IBS (irritable bowel syndrome)   . Insomnia   . Mitral valve prolapse   . Peripheral vascular disease ([place])   . Sciatic nerve disease   . Shortness of breath   . Stroke [facility]) March 2015    Past Surgical History:  Procedure Laterality Date  . CAROTID ENDARTERECTOMY Right January 17, 2014   CE  . CHOLECYSTECTOMY    . COLONOSCOPY    . ENDARTERECTOMY Right 01/17/2014   Procedure: RIGHT CAROTID ARTERY ENDARTERECTOMY WITH DACRON PATCH ANGIOPLASTY;  Surgeon: Rosetta Posner, MD;  Location: [facility] OR;  Service: Vascular;  Laterality: Right;  . UPPER GASTROINTESTINAL ENDOSCOPY      Family History  Problem Relation Age of Onset  . Breast cancer Mother   . Ovarian cancer Mother   . AAA (abdominal aortic aneurysm) Mother   . Emphysema Mother   . Lung cancer Father   . Colon cancer Paternal Aunt   . Emphysema Paternal Aunt   . Pancreatic cancer Maternal Grandmother   . Diabetes Maternal Aunt   . Breast cancer Maternal Aunt   . Heart disease Paternal Grandfather   . Diabetes Paternal Grandfather   . Kidney disease Maternal Aunt     Social History   Social History  . Marital status: Divorced    Spouse name: N/A  . Number of children: 2  . Years of education: ASSOCIATES   Occupational History  . Bookeeper    Social History Main Topics  . Smoking status: Former Smoker    Packs/day: 1.50    Years: 47.00    Types: Cigarettes    Start date: 12/03/1966    Quit date: 01/02/2014  . Smokeless tobacco: Never Used     Comment: Stopped for 6 months once in 2007  . Alcohol use No  . Drug use: No  . Sexual activity: Not Currently   Other Topics Concern  . None   Social History Narrative   Patient is divorced with 2  children.   Patient is right handed.   Patient has her Associates degree.   Patient drinks 3 cups daily.      [facility] (07/30/17):   Originally from [facility] [facility]. Has always lived in [place]. 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 Previously had cockatoo and a [place] for months. Did have mold in her current home that was remediated. Enjoys playing on the computer, reading, &  babysitting.        Objective:   Physical Exam BP (!) 146/90 (BP Location: Right Arm, Patient Position: Sitting, Cuff Size: Large)   Pulse 94   Ht 5\' 5"  (1.651 m)   Wt 206 lb 9.6 oz (93.7 kg)   SpO2 99%   BMI 34.38 kg/m  General:  Awake. Alert. No acute distress. Central obesity. Integument:  Warm & dry. No rash on exposed skin. No bruising on exposed skin. Extremities:  No cyanosis or clubbing.  Lymphatics:  No appreciated cervical or supraclavicular lymphadenoapthy. HEENT:  Moist mucus membranes. No oral ulcers. Minimal nasal turbinate swelling. Cardiovascular:  Regular rate. No edema. Regular rhythm.  Pulmonary:  Good aeration & clear to auscultation bilaterally. Symmetric chest wall expansion. No accessory muscle use on room air. Abdomen: Soft. Normal bowel sounds. Protuberant. Musculoskeletal:  Normal bulk and tone. Hand grip strength 5/5 bilaterally. No joint deformity or effusion appreciated. Neurological:  CN 2-12 grossly in tact. No meningismus. Moving all 4 extremities equally. Symmetric brachioradialis deep tendon reflexes. Psychiatric:  Mood and affect congruent. Speech normal rhythm, rate & tone.   IMAGING V/Q SCAN 01/31/17 (per radiologist):  Low probability for pulmonary embolus with bilateral matched defects.  BILATERAL LOWER EXTREMITY VENOUS DUPLEX 01/31/17 (per vascular surgery):  No DVT or SVT.  CXR PA/LAT 01/30/17 (personally reviewed by me):  No parenchymal mass or opacity appreciated. No pleural effusion. Low lung volumes. Heart normal in size & mediastinum normal in  contour.    Assessment & Plan:  66 y.o. female with long-standing history of tobacco use having quit in 2015 at the time of her stroke. Patient reportedly previously diagnosed with COPD during pulmonary function testing in office. Certainly this is quite probable given her long history of tobacco use. However, she continues to have recurrent bronchitis which is concerning for possible immunodeficiency or could simply be a manifestation of intermittent exacerbations of her COPD. With her chronic allergic rhinitis and reflux reasonably well-controlled at this time I believe we can proceed with further workup before I adjust her inhaled medication regimen. I instructed the patient to contact my office if she had questions or concerns before her next appointment.  1. COPD:  Screening for alpha-1 antitrypsin deficiency. Checking full pulmonary function testing and 6 minute walk test. Continuing patient on duo nebs and Combivent. 2. Chronic allergic rhinitis: Checking serum RAST panel. 3. GERD: Controlled with Protonix. No changes. 4. Recurrent bronchitis: Checking quantitative immunoglobulin panel. 5. Health maintenance: Status post Pneumovax March 2015. Administering high-dose influenza vaccine today. 6. Follow-up: Return to clinic in 4 weeks or sooner if needed.   Sonia Baller Ashok Cordia, M.D. [facility] Pager:  [phone] After 7pm or if no response, call [phone] 3:54 PM 07/30/17

## 2017-07-30 NOTE — Patient Instructions (Addendum)
   Continue using your nebulizer and Combivent as prescribed.  We will review your test results at your next appointment.   TESTS ORDERED: 1. Full PFTs before next appointment 2. 6MWT on room air before next appointment 3. Serum Quantitative Immunoglobulin Panel, RAST Panel & Alpha-1 Antitrypsin Phenotype

## 2017-08-02 LAB — RESPIRATORY ALLERGY PROFILE REGION II ~~LOC~~
Allergen, A. alternata, m6: <0.1 kU/L
Allergen, Cedar tree, t12: <0.1 kU/L
Allergen, Comm Silver Birch, t9: <0.1 kU/L
Allergen, Cottonwood, t14: <0.1 kU/L
Allergen, Mulberry, t76: <0.1 kU/L
Allergen, P. notatum, m1: <0.1 kU/L
Aspergillus fumigatus, m3: <0.1 kU/L
Box Elder IgE: <0.1 kU/L
CLASS: 0
CLASS: 0
CLASS: 0
CLASS: 0
CLASS: 0
CLASS: 0
CLASS: 0
CLASS: 0
CLASS: 0
CLASS: 0
COMMON RAGWEED (SHORT) (W1) IGE: <0.1 kU/L
Cat Dander: <0.1 kU/L
Class: 0
Class: 0
Class: 0
Class: 0
Class: 0
Class: 0
Class: 0
Class: 0
Class: 0
Class: 0
Class: 0
Class: 0
Class: 0
Class: 0
D. farinae: <0.1 kU/L
Dog Dander: <0.1 kU/L
Elm IgE: <0.1 kU/L
IGE (IMMUNOGLOBULIN E), SERUM: 15 kU/L (ref <=114)
Johnson Grass: <0.1 kU/L
Rough Pigweed  IgE: <0.1 kU/L
Sheep Sorrel IgE: <0.1 kU/L
Timothy Grass: <0.1 kU/L

## 2017-08-02 LAB — IGG, IGA, IGM
IGG (IMMUNOGLOBIN G), SERUM: 1145 mg/dL (ref 694–1618)
IGM, SERUM: 727 mg/dL — AB (ref 48–271)
Immunoglobulin A: 232 mg/dL (ref 81–463)

## 2017-08-02 LAB — INTERPRETATION:

## 2017-08-02 LAB — ALPHA-1 ANTITRYPSIN PHENOTYPE: A-1 Antitrypsin, Ser: 162 mg/dL (ref 83–199)

## 2017-08-04 DIAGNOSIS — E782 Mixed hyperlipidemia: Secondary | ICD-10-CM | POA: Diagnosis not present

## 2017-08-04 DIAGNOSIS — E559 Vitamin D deficiency, unspecified: Secondary | ICD-10-CM | POA: Diagnosis not present

## 2017-08-04 DIAGNOSIS — I10 Essential (primary) hypertension: Secondary | ICD-10-CM | POA: Diagnosis not present

## 2017-08-04 DIAGNOSIS — F419 Anxiety disorder, unspecified: Secondary | ICD-10-CM | POA: Diagnosis not present

## 2017-08-12 ENCOUNTER — Encounter: Payer: Self-pay | Admitting: Vascular Surgery

## 2017-08-12 ENCOUNTER — Ambulatory Visit (HOSPITAL_COMMUNITY)
Admission: RE | Admit: 2017-08-12 | Discharge: 2017-08-12 | Disposition: A | Discharge status: Home / Self Care | Payer: Medicare Other | Source: Ambulatory Visit | Attending: Vascular Surgery | Admitting: Vascular Surgery

## 2017-08-12 ENCOUNTER — Ambulatory Visit (INDEPENDENT_AMBULATORY_CARE_PROVIDER_SITE_OTHER): Payer: Medicare Other | Admitting: Vascular Surgery

## 2017-08-12 VITALS — BP 122/72 | HR 73 | Temp 97.7°F | Resp 18 | Ht 65.0 in | Wt 206.3 lb

## 2017-08-12 DIAGNOSIS — I6522 Occlusion and stenosis of left carotid artery: Secondary | ICD-10-CM | POA: Diagnosis not present

## 2017-08-12 DIAGNOSIS — I6523 Occlusion and stenosis of bilateral carotid arteries: Secondary | ICD-10-CM

## 2017-08-12 DIAGNOSIS — I6529 Occlusion and stenosis of unspecified carotid artery: Secondary | ICD-10-CM

## 2017-08-12 LAB — VAS US CAROTID
LCCADDIAS: 28 cm/s
LCCADSYS: 88 cm/s
LCCAPDIAS: 23 cm/s
LEFT ECA DIAS: -54 cm/s
LEFT VERTEBRAL DIAS: 13 cm/s
LICAPDIAS: 32 cm/s
Left CCA prox sys: 84 cm/s
Left ICA prox sys: 152 cm/s
RCCAPSYS: 79 cm/s
RIGHT CCA MID DIAS: -18 cm/s
RIGHT ECA DIAS: -38 cm/s
RIGHT VERTEBRAL DIAS: 10 cm/s
Right CCA prox dias: 12 cm/s
Right cca dist sys: -72 cm/s

## 2017-08-12 NOTE — Progress Notes (Signed)
HISTORY AND PHYSICAL     CC:  Re-check Requesting Provider:  Lawerance Cruel, MD  HPI: This is a 66 y.o. female who is s/p rigth carotid endarterectomy on 01/17/14 by Dr. Donnetta Hutching.  She says she has been doing well since last seeing Dr. Donnetta Hutching.  She denies any weakness/numbness of her extremities, difficulty with speech or temporary blindness.    She states that she quit smoking in March 2015 and has not started back.    She takes plavix daily.  She is on a beta blocker, ACEI, and CCB for blood pressure control.  She states that she does have a productive cough but is seeing a pulmonologist for COPD and is being tested for different allergies.    Past Medical History:  Diagnosis Date  . Allergic rhinitis   . Anemia    per hosp. - recently- pt. told that she is anemic  . Anxiety   . Colitis   . COPD (chronic obstructive pulmonary disease) (HCC)    no o2  . Depression   . Diverticulosis   . Female bladder prolapse   . Fibroids   . GERD (gastroesophageal reflux disease)   . Heart murmur   . Hyperlipemia   . Hypertension   . IBS (irritable bowel syndrome)   . Insomnia   . Mitral valve prolapse   . Peripheral vascular disease ([place])   . Sciatic nerve disease   . Shortness of breath   . Stroke [facility]) March 2015    Past Surgical History:  Procedure Laterality Date  . CAROTID ENDARTERECTOMY Right January 17, 2014   CE  . CHOLECYSTECTOMY    . COLONOSCOPY    . ENDARTERECTOMY Right 01/17/2014   Procedure: RIGHT CAROTID ARTERY ENDARTERECTOMY WITH DACRON PATCH ANGIOPLASTY;  Surgeon: Rosetta Posner, MD;  Location: [facility] OR;  Service: Vascular;  Laterality: Right;  . UPPER GASTROINTESTINAL ENDOSCOPY      Allergies  Allergen Reactions  . Carvedilol Palpitations  . Dapsone Other (See Comments)    Reaction:  Flushing   . Niacin And Related Other (See Comments)    Reaction:  Flushing   . Septra [Sulfamethoxazole-Trimethoprim]     Nausea, vomiting, diarrhea  . Wellbutrin [Bupropion]    Caused jittery   . Amoxicillin Rash and Other (See Comments)    Has patient had a PCN reaction causing immediate rash, facial/tongue/throat swelling, SOB or lightheadedness with hypotension: No Has patient had a PCN reaction causing severe rash involving mucus membranes or skin necrosis: No Has patient had a PCN reaction that required hospitalization No Has patient had a PCN reaction occurring within the last 10 years: No If all of the above answers are "NO", then may proceed with Cephalosporin use.    Current Outpatient Prescriptions  Medication Sig Dispense Refill  . acetaminophen (TYLENOL) 500 MG tablet Take 1,000 mg by mouth every 6 (six) hours as needed for mild pain, moderate pain, fever or headache.     Marland Kitchen amLODipine (NORVASC) 10 MG tablet Take 5 mg by mouth daily.     Marland Kitchen atenolol (TENORMIN) 100 MG tablet Take 1 tablet (100 mg total) by mouth daily. (Patient taking differently: Take 50 mg by mouth daily. ) 30 tablet 1  . Cholecalciferol (VITAMIN D3) 5000 units CAPS Take 5,000 Units by mouth every evening.     . clopidogrel (PLAVIX) 75 MG tablet Take 75 mg by mouth daily.     . DULoxetine (CYMBALTA) 30 MG capsule Take 90 mg by mouth every evening.    Marland Kitchen  Ipratropium-Albuterol (COMBIVENT RESPIMAT) 20-100 MCG/ACT AERS respimat Take 1 puff by mouth every 6 (six) hours as needed for wheezing or shortness of breath.     Marland Kitchen ipratropium-albuterol (DUONEB) 0.5-2.5 (3) MG/3ML SOLN Take 3 mLs by nebulization every 6 (six) hours as needed (for wheezing/shortness of breath).    . isosorbide mononitrate (IMDUR) 30 MG 24 hr tablet Take 30 mg by mouth 2 (two) times daily.  0  . lisinopril (PRINIVIL,ZESTRIL) 40 MG tablet Take 40 mg by mouth daily.    Marland Kitchen LORazepam (ATIVAN) 0.5 MG tablet Take 0.5 mg by mouth 2 (two) times daily as needed for anxiety.   0  . pantoprazole (PROTONIX) 40 MG tablet Take 40 mg by mouth 2 (two) times daily.     . simvastatin (ZOCOR) 10 MG tablet Take 1 tablet (10 mg total) by mouth at  bedtime. 30 tablet 3  . traMADol (ULTRAM) 50 MG tablet Take 50 mg by mouth every 6 (six) hours as needed for moderate pain.      No current facility-administered medications for this visit.     Family History  Problem Relation Age of Onset  . Breast cancer Mother   . Ovarian cancer Mother   . AAA (abdominal aortic aneurysm) Mother   . Emphysema Mother   . Lung cancer Father   . Colon cancer Paternal Aunt   . Emphysema Paternal Aunt   . Pancreatic cancer Maternal Grandmother   . Diabetes Maternal Aunt   . Breast cancer Maternal Aunt   . Heart disease Paternal Grandfather   . Diabetes Paternal Grandfather   . Kidney disease Maternal Aunt     Social History   Social History  . Marital status: Divorced    Spouse name: N/A  . Number of children: 2  . Years of education: ASSOCIATES   Occupational History  . Bookeeper    Social History Main Topics  . Smoking status: Former Smoker    Packs/day: 1.50    Years: 47.00    Types: Cigarettes    Start date: 12/03/1966    Quit date: 01/02/2014  . Smokeless tobacco: Never Used     Comment: Stopped for 6 months once in 2007  . Alcohol use No  . Drug use: No  . Sexual activity: Not Currently   Other Topics Concern  . Not on file   Social History Narrative   Patient is divorced with 2 children.   Patient is right handed.   Patient has her Associates degree.   Patient drinks 3 cups daily.      [facility] (07/30/17):   Originally from [facility] [place] [facility]. Has always lived in [place]. 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 Previously had cockatoo and a [place] for months. Did have mold in her current home that was remediated. Enjoys playing on the computer, reading, & babysitting.       REVIEW OF SYSTEMS:   [X]  denotes positive finding, [ ]  denotes negative finding Cardiac  Comments:  Chest pain or chest pressure:    Shortness of breath upon exertion: x   Short of breath when lying flat:    Irregular heart rhythm:         Vascular    Pain in calf, thigh, or hip brought on by ambulation:    Pain in feet at night that wakes you up from your sleep:     Blood clot in your veins:    Leg swelling:  Pulmonary    Oxygen at home:    Productive cough:  x See HPI  Wheezing:         Neurologic    Sudden weakness in arms or legs:     Sudden numbness in arms or legs:     Sudden onset of difficulty speaking or slurred speech:    Temporary loss of vision in one eye:     Problems with dizziness:         Gastrointestinal    Blood in stool:     Vomited blood:         Genitourinary    Burning when urinating:     Blood in urine:        Psychiatric    Major depression:         Hematologic    Bleeding problems:    Problems with blood clotting too easily:        Skin    Rashes or ulcers: x       Constitutional    Fever or chills:      PHYSICAL EXAMINATION:  Vitals:   08/12/17 1601 08/12/17 1602  BP: 121/70 122/72  Pulse: 73   Resp: 18   Temp: 97.7 F (36.5 C)   SpO2: 100%    Vitals:   08/12/17 1601  Weight: 206 lb 4.8 oz (93.6 kg)  Height: 5\' 5"  (1.651 m)   Body mass index is 34.33 kg/m.  General:  WDWN in NAD; vital signs documented above Gait: Not observed HENT: WNL, normocephalic Pulmonary: normal non-labored breathing , without Rales, rhonchi,  wheezing Cardiac: regular HR, without  Murmurs  +left carotid bruit Skin: without rashes; well healed scar right neck. Vascular Exam/Pulses:  Right Left  Radial 2+ (normal) 2+ (normal)  Ulnar Unable to palpate  Unable to palpate   DP 2+ (normal) 2+ (normal)  PT Unable to palpate  Unable to palpate   Extremities: without ischemic changes, without Gangrene , without cellulitis; without open wounds;  Musculoskeletal: no muscle wasting or atrophy  Neurologic: A&O X 3;  No focal weakness or paresthesias are detected Psychiatric:  The pt has Normal affect.   Non-Invasive Vascular Imaging:   Carotid duplex 08/12/17 1.   Patent right CEA site with no ICA stenosis 2.  Doppler velocities suggest 1-39% stenosis of the left proximal ICA 3.  Doppler velocities suggest >50% left ECA stenosis   Pt meds includes: Statin:  Yes.   Beta Blocker:  Yes.   Aspirin:  No. ACEI:  Yes.   ARB:  No. CCB use:  Yes Other Antiplatelet/Anticoagulant:  Yes Plavix   ASSESSMENT/PLAN:: 66 y.o. female who is s/p left carotid endarterectomy in March 2015   -pt continues to do well and is asymptomatic -duplex is unchanged from a year ago.  She does have some left ECA stenosis with bruit present. -praised pt for continuing smoking cessation  -f/u in one year with carotid duplex with NP.  She will contact us sooner if she has issues.     Leontine Locket, PA-C [facility] [phone]  Clinic MD:  Pt seen and examined with Dr. Donnetta Hutching  I have examined the patient, reviewed and agree with above.  Curt Jews, MD 08/12/2017 4:45 PM

## 2017-08-13 DIAGNOSIS — J449 Chronic obstructive pulmonary disease, unspecified: Secondary | ICD-10-CM | POA: Diagnosis not present

## 2017-08-27 NOTE — Addendum Note (Signed)
Addended by: Lianne Cure A on: 08/27/2017 03:33 PM   Modules accepted: Orders

## 2017-09-01 ENCOUNTER — Ambulatory Visit (INDEPENDENT_AMBULATORY_CARE_PROVIDER_SITE_OTHER): Payer: Medicare Other | Admitting: Pulmonary Disease

## 2017-09-01 DIAGNOSIS — J449 Chronic obstructive pulmonary disease, unspecified: Secondary | ICD-10-CM | POA: Diagnosis not present

## 2017-09-01 LAB — PULMONARY FUNCTION TEST
DL/VA % PRED: 68 %
DL/VA: 3.33 ml/min/mmHg/L
DLCO COR: 13.68 ml/min/mmHg
DLCO cor % pred: 54 %
DLCO unc % pred: 49 %
DLCO unc: 12.39 ml/min/mmHg
FEF 25-75 Post: 0.74 L/sec
FEF 25-75 Pre: 0.43 L/sec
FEF2575-%CHANGE-POST: 73 %
FEF2575-%PRED-PRE: 20 %
FEF2575-%Pred-Post: 36 %
FEV1-%Change-Post: 24 %
FEV1-%Pred-Post: 52 %
FEV1-%Pred-Pre: 42 %
FEV1-Post: 1.25 L
FEV1-Pre: 1 L
FEV1FVC-%CHANGE-POST: 7 %
FEV1FVC-%PRED-PRE: 68 %
FEV6-%Change-Post: 16 %
FEV6-%PRED-PRE: 61 %
FEV6-%Pred-Post: 72 %
FEV6-POST: 2.16 L
FEV6-PRE: 1.85 L
FEV6FVC-%Change-Post: 0 %
FEV6FVC-%Pred-Post: 101 %
FEV6FVC-%Pred-Pre: 100 %
FVC-%Change-Post: 16 %
FVC-%PRED-POST: 71 %
FVC-%PRED-PRE: 61 %
FVC-POST: 2.23 L
FVC-Pre: 1.92 L
PRE FEV1/FVC RATIO: 52 %
Post FEV1/FVC ratio: 56 %
Post FEV6/FVC ratio: 97 %
Pre FEV6/FVC Ratio: 97 %
RV % pred: 175 %
RV: 3.8 L
TLC % pred: 114 %
TLC: 5.89 L

## 2017-09-01 NOTE — Progress Notes (Signed)
PFT done today. 

## 2017-09-03 ENCOUNTER — Ambulatory Visit (INDEPENDENT_AMBULATORY_CARE_PROVIDER_SITE_OTHER): Payer: Medicare Other | Admitting: Pulmonary Disease

## 2017-09-03 ENCOUNTER — Ambulatory Visit: Payer: Medicare Other

## 2017-09-03 ENCOUNTER — Encounter: Payer: Self-pay | Admitting: Pulmonary Disease

## 2017-09-03 VITALS — BP 130/68 | HR 79 | Ht 64.5 in | Wt 207.8 lb

## 2017-09-03 DIAGNOSIS — J309 Allergic rhinitis, unspecified: Secondary | ICD-10-CM | POA: Diagnosis not present

## 2017-09-03 DIAGNOSIS — J449 Chronic obstructive pulmonary disease, unspecified: Secondary | ICD-10-CM

## 2017-09-03 DIAGNOSIS — K219 Gastro-esophageal reflux disease without esophagitis: Secondary | ICD-10-CM | POA: Diagnosis not present

## 2017-09-03 MED ORDER — RANITIDINE HCL 150 MG PO TABS: 150.0000 mg | ORAL_TABLET | Freq: Two times a day (BID) | ORAL | 3 refills | Status: DC

## 2017-09-03 MED ORDER — UMECLIDINIUM-VILANTEROL 62.5-25 MCG/INH IN AEPB
1.0000 | INHALATION_SPRAY | Freq: Every day | RESPIRATORY_TRACT | 3 refills | Status: DC
Start: 2017-09-03 — End: 2018-03-13

## 2017-09-03 NOTE — Progress Notes (Signed)
Subjective:    Patient ID: Shannon Roberts, female    DOB: 26-Mar-1951, 66 y.o.   MRN: [id number]  C.C.:  Follow-up for Severe COPD, Chronic Allergic Rhinitis, & GERD.  HPI Severe COPD: Remotely she was on Advair. Previously prescribed to nebs and Combivent. No evidence of immunodeficiency. She feels like her pain has affected her breathing. She does feel her breathing is improved with her nebulizer therapies. She reports minimal coughing or wheezing. No exacerbations since last appointment.   Chronic allergic rhinitis: No identifiable serum antigen. She is adherent to her Flonase. No sinus congestion or drainage. No sinus pressure.   GERD: Previously prescribed Protonix. She reports intermittent reflux or dyspepsia.   Review of Systems She reports back pain form her slipped disc. No chest pain or pressure. No fever or chills. No other joint pain or swelling.   Allergies  Allergen Reactions  . Carvedilol Palpitations  . Dapsone Other (See Comments)    Reaction:  Flushing   . Niacin And Related Other (See Comments)    Reaction:  Flushing   . Septra [Sulfamethoxazole-Trimethoprim]     Nausea, vomiting, diarrhea  . Wellbutrin [Bupropion]     Caused jittery   . Amoxicillin Rash and Other (See Comments)    Has patient had a PCN reaction causing immediate rash, facial/tongue/throat swelling, SOB or lightheadedness with hypotension: No Has patient had a PCN reaction causing severe rash involving mucus membranes or skin necrosis: No Has patient had a PCN reaction that required hospitalization No Has patient had a PCN reaction occurring within the last 10 years: No If all of the above answers are "NO", then may proceed with Cephalosporin use.    Current Outpatient Medications on File Prior to Visit  Medication Sig Dispense Refill  . acetaminophen (TYLENOL) 500 MG tablet Take 1,000 mg by mouth every 6 (six) hours as needed for mild pain, moderate pain, fever or headache.     Marland Kitchen amLODipine  (NORVASC) 10 MG tablet Take 5 mg by mouth daily.     Marland Kitchen atenolol (TENORMIN) 100 MG tablet Take 1 tablet (100 mg total) by mouth daily. (Patient taking differently: Take 50 mg by mouth daily. ) 30 tablet 1  . Cholecalciferol (VITAMIN D3) 5000 units CAPS Take 5,000 Units by mouth every evening.     . clopidogrel (PLAVIX) 75 MG tablet Take 75 mg by mouth daily.     . DULoxetine (CYMBALTA) 30 MG capsule Take 90 mg by mouth every evening.    . Ipratropium-Albuterol (COMBIVENT RESPIMAT) 20-100 MCG/ACT AERS respimat Take 1 puff by mouth every 6 (six) hours as needed for wheezing or shortness of breath.     Marland Kitchen ipratropium-albuterol (DUONEB) 0.5-2.5 (3) MG/3ML SOLN Take 3 mLs by nebulization every 6 (six) hours as needed (for wheezing/shortness of breath).    . isosorbide mononitrate (IMDUR) 30 MG 24 hr tablet Take 30 mg by mouth 2 (two) times daily.  0  . lisinopril (PRINIVIL,ZESTRIL) 40 MG tablet Take 40 mg by mouth daily.    Marland Kitchen LORazepam (ATIVAN) 0.5 MG tablet Take 0.5 mg by mouth 2 (two) times daily as needed for anxiety.   0  . pantoprazole (PROTONIX) 40 MG tablet Take 40 mg by mouth 2 (two) times daily.     . simvastatin (ZOCOR) 10 MG tablet Take 1 tablet (10 mg total) by mouth at bedtime. 30 tablet 3  . traMADol (ULTRAM) 50 MG tablet Take 50 mg by mouth every 6 (six) hours as needed for  moderate pain.      No current facility-administered medications on file prior to visit.     Past Medical History:  Diagnosis Date  . Allergic rhinitis   . Anemia    per hosp. - recently- pt. told that she is anemic  . Anxiety   . Colitis   . COPD (chronic obstructive pulmonary disease) (HCC)    no o2  . Depression   . Diverticulosis   . Female bladder prolapse   . Fibroids   . GERD (gastroesophageal reflux disease)   . Heart murmur   . Hyperlipemia   . Hypertension   . IBS (irritable bowel syndrome)   . Insomnia   . Mitral valve prolapse   . Peripheral vascular disease ([place])   . Sciatic nerve disease    . Shortness of breath   . Stroke [facility]) March 2015    Past Surgical History:  Procedure Laterality Date  . CAROTID ENDARTERECTOMY Right January 17, 2014   CE  . CHOLECYSTECTOMY    . COLONOSCOPY    . UPPER GASTROINTESTINAL ENDOSCOPY      Family History  Problem Relation Age of Onset  . Breast cancer Mother   . Ovarian cancer Mother   . AAA (abdominal aortic aneurysm) Mother   . Emphysema Mother   . Lung cancer Father   . Colon cancer Paternal Aunt   . Emphysema Paternal Aunt   . Pancreatic cancer Maternal Grandmother   . Diabetes Maternal Aunt   . Breast cancer Maternal Aunt   . Heart disease Paternal Grandfather   . Diabetes Paternal Grandfather   . Kidney disease Maternal Aunt     Social History   Socioeconomic History  . Marital status: Divorced    Spouse name: None  . Number of children: 2  . Years of education: ASSOCIATES  . Highest education level: None  Social Needs  . Financial resource strain: None  . Food insecurity - worry: None  . Food insecurity - inability: None  . Transportation needs - medical: None  . Transportation needs - non-medical: None  Occupational History  . Occupation: Bookeeper  Tobacco Use  . Smoking status: Former Smoker    Packs/day: 1.50    Years: 47.00    Pack years: 70.50    Types: Cigarettes    Start date: 12/03/1966    Last attempt to quit: 01/02/2014    Years since quitting: 3.6  . Smokeless tobacco: Never Used  . Tobacco comment: Stopped for 6 months once in 2007  Substance and Sexual Activity  . Alcohol use: No    Alcohol/week: 0.0 oz  . Drug use: No  . Sexual activity: Not Currently  Other Topics Concern  . None  Social History Narrative   Patient is divorced with 2 children.   Patient is right handed.   Patient has her Associates degree.   Patient drinks 3 cups daily.      [facility] (07/30/17):   Originally from [facility]. Has always lived in [place]. 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  Previously had cockatoo and a [place] for months. Did have mold in her current home that was remediated. Enjoys playing on the computer, reading, & babysitting.        Objective:   Physical Exam BP 130/68 (BP Location: Left Arm, Cuff Size: Normal)   Pulse 79   Ht 5' 4.5" (1.638 m)   Wt 207 lb 12.8 oz (94.3 kg)   SpO2 99%  BMI 35.12 kg/m   General:  Awake. Obese. No distress.  Integument:  Warm. Dry. No rash. Extremities:  No cyanosis or clubbing.  HEENT: Moist mucous membranes. No nasal turbinate swelling. No oral ulcers. Cardiovascular:  Regular rate. No edema. Regular rhythm.  Pulmonary:  Clear bilaterally with auscultation. Normal work of breathing on room air. Abdomen: Soft. Normal bowel sounds. Mildly protuberant. Musculoskeletal:  Normal bulk and tone. No joint deformity or effusion appreciated. Neurological:  Cranial nerves 2-12 grossly in tact. No meningismus.   PFT 09/01/17: FVC 1.92 L (61%) FEV1 1.00 L (42%) FEV1/FVC 0.52 FEF 25-75 0.43 L (20%) positive bronchodilator response TLC 5.89 L (114%) RV 175% ERV 39% DLC uncorrected 54%  IMAGING V/Q SCAN 01/31/17 (per radiologist):  Low probability for pulmonary embolus with bilateral matched defects.  BILATERAL LOWER EXTREMITY VENOUS DUPLEX 01/31/17 (per vascular surgery):  No DVT or SVT.  CXR PA/LAT 01/30/17 (previously reviewed by me):  No parenchymal mass or opacity appreciated. No pleural effusion. Low lung volumes. Heart normal in size & mediastinum normal in contour.  LABS 07/30/17 Alpha-1 antitrypsin: MM (162) IgG: 1145 IgM: 727 IgE: 15 RAST panel: Negative    Assessment & Plan:  66 y.o. female with severe COPD based on spirometry above. She does have a significant bronchodilator response as well as evidence of air trapping on her lung volumes. She was unable to perform 6 minute walk test today due to back pain from her slipped disc. Given her symptomatic improvement with the use of her rescue medications I am  starting her on a LABA/LAMA inhaler today. The patient is concerned about the long-term negative side effects of daily proton pump inhibitor use. As such, I will try to transition the patient over to an H2 blocker. Overall her allergic rhinitis seems to be well controlled with Flonase. I reviewed her serum lab tests from last appointment today. I instructed the patient to contact our office if she had any questions or new breathing problems before her next appointment.  1. Severe COPD: Starting the patient on Anoro. Continuing Combivent/Duonebs as needed. 2. Chronic allergic rhinitis: Controlled with Flonase. No new medications. 3. GERD: Transitioning patient from Protonix to Zantac and instructed on proper taper off Protonix. 4. Health maintenance: Status post Pneumovax March 2015, Prevnar April 2017, & influenza vaccine October 2018.  5. Follow-up: Return to clinic in 3 months or sooner if needed.  Sonia Baller Ashok Cordia, M.D. [facility] Pager:  [phone] After 7pm or if no response, call [phone] 3:59 PM 09/03/17

## 2017-09-03 NOTE — Patient Instructions (Signed)
   We are starting you on Anoro as a daily medication to help your breathing.   You can continue using your Combivent and nebulizer treatments as needed if you have any coughing, wheezing, or shortness of breath.  Let me know if your insurance won't cover the Anoro and prefers another inhaler.   Use the following regimen to help wean yourself off of the Protonix:  Start taking Zantac (also called Ranitidine) 150mg  every night for a week  After 1 week try taking your Protonix every other day for 1-2 weeks.  If your heartburn is doing ok then take the Protonix every Monday, Wednesday, & Friday for 1-2 weeks  If your heartburn is still doing ok then stop taking the Protonix completely but continue taking the Zantac every night.  Call my office if you have any new breathing problems or questions before your next appointment.

## 2017-09-08 ENCOUNTER — Telehealth: Payer: Self-pay | Admitting: Pulmonary Disease

## 2017-09-08 NOTE — Telephone Encounter (Signed)
Per Dr. Ashok Cordia patient is needing a sample of the Anoro inhaler. We do not have any samples at this time, I called patient and left a voicemail stating that once we had a sample to give her I would call and let her know.

## 2017-09-14 DIAGNOSIS — J449 Chronic obstructive pulmonary disease, unspecified: Secondary | ICD-10-CM | POA: Diagnosis not present

## 2017-10-08 DIAGNOSIS — J449 Chronic obstructive pulmonary disease, unspecified: Secondary | ICD-10-CM | POA: Diagnosis not present

## 2017-10-08 DIAGNOSIS — J069 Acute upper respiratory infection, unspecified: Secondary | ICD-10-CM | POA: Diagnosis not present

## 2017-10-29 DIAGNOSIS — H524 Presbyopia: Secondary | ICD-10-CM | POA: Diagnosis not present

## 2017-12-26 DIAGNOSIS — S86812A Strain of other muscle(s) and tendon(s) at lower leg level, left leg, initial encounter: Secondary | ICD-10-CM | POA: Diagnosis not present

## 2018-01-29 DIAGNOSIS — N390 Urinary tract infection, site not specified: Secondary | ICD-10-CM | POA: Diagnosis not present

## 2018-02-03 DIAGNOSIS — R300 Dysuria: Secondary | ICD-10-CM | POA: Diagnosis not present

## 2018-02-03 DIAGNOSIS — N390 Urinary tract infection, site not specified: Secondary | ICD-10-CM | POA: Diagnosis not present

## 2018-02-05 DIAGNOSIS — F419 Anxiety disorder, unspecified: Secondary | ICD-10-CM | POA: Diagnosis not present

## 2018-02-05 DIAGNOSIS — N390 Urinary tract infection, site not specified: Secondary | ICD-10-CM | POA: Diagnosis not present

## 2018-02-05 DIAGNOSIS — M549 Dorsalgia, unspecified: Secondary | ICD-10-CM | POA: Diagnosis not present

## 2018-02-05 DIAGNOSIS — E782 Mixed hyperlipidemia: Secondary | ICD-10-CM | POA: Diagnosis not present

## 2018-02-09 DIAGNOSIS — R109 Unspecified abdominal pain: Secondary | ICD-10-CM | POA: Diagnosis not present

## 2018-02-09 DIAGNOSIS — Z1211 Encounter for screening for malignant neoplasm of colon: Secondary | ICD-10-CM | POA: Diagnosis not present

## 2018-02-09 DIAGNOSIS — K5900 Constipation, unspecified: Secondary | ICD-10-CM | POA: Diagnosis not present

## 2018-02-16 DIAGNOSIS — F419 Anxiety disorder, unspecified: Secondary | ICD-10-CM | POA: Diagnosis not present

## 2018-02-16 DIAGNOSIS — N390 Urinary tract infection, site not specified: Secondary | ICD-10-CM | POA: Diagnosis not present

## 2018-02-16 DIAGNOSIS — E782 Mixed hyperlipidemia: Secondary | ICD-10-CM | POA: Diagnosis not present

## 2018-02-16 DIAGNOSIS — M549 Dorsalgia, unspecified: Secondary | ICD-10-CM | POA: Diagnosis not present

## 2018-03-02 ENCOUNTER — Ambulatory Visit: Payer: Medicare Other | Admitting: Pulmonary Disease

## 2018-03-02 NOTE — Progress Notes (Deleted)
Synopsis: Former patient of Dr. Ashok Cordia with COPD  Subjective:   PATIENT ID: Shannon Roberts GENDER: female DOB: 01-Dec-1950, MRN: [id number]   HPI  No chief complaint on file.   ***  Past Medical History:  Diagnosis Date  . Allergic rhinitis   . Anemia    per hosp. - recently- pt. told that she is anemic  . Anxiety   . Colitis   . COPD (chronic obstructive pulmonary disease) (HCC)    no o2  . Depression   . Diverticulosis   . Female bladder prolapse   . Fibroids   . GERD (gastroesophageal reflux disease)   . Heart murmur   . Hyperlipemia   . Hypertension   . IBS (irritable bowel syndrome)   . Insomnia   . Mitral valve prolapse   . Peripheral vascular disease ([place])   . Sciatic nerve disease   . Shortness of breath   . Stroke [facility]) March 2015     Family History  Problem Relation Age of Onset  . Breast cancer Mother   . Ovarian cancer Mother   . AAA (abdominal aortic aneurysm) Mother   . Emphysema Mother   . Lung cancer Father   . Colon cancer Paternal Aunt   . Emphysema Paternal Aunt   . Pancreatic cancer Maternal Grandmother   . Diabetes Maternal Aunt   . Breast cancer Maternal Aunt   . Heart disease Paternal Grandfather   . Diabetes Paternal Grandfather   . Kidney disease Maternal Aunt      Social History   Socioeconomic History  . Marital status: Divorced    Spouse name: Not on file  . Number of children: 2  . Years of education: ASSOCIATES  . Highest education level: Not on file  Occupational History  . Occupation: Print production planner  . Financial resource strain: Not on file  . Food insecurity:    Worry: Not on file    Inability: Not on file  . Transportation needs:    Medical: Not on file    Non-medical: Not on file  Tobacco Use  . Smoking status: Former Smoker    Packs/day: 1.50    Years: 47.00    Pack years: 70.50    Types: Cigarettes    Start date: 12/03/1966    Last attempt to quit: 01/02/2014    Years since quitting: 4.1    . Smokeless tobacco: Never Used  . Tobacco comment: Stopped for 6 months once in 2007  Substance and Sexual Activity  . Alcohol use: No    Alcohol/week: 0.0 oz  . Drug use: No  . Sexual activity: Not Currently  Lifestyle  . Physical activity:    Days per week: Not on file    Minutes per session: Not on file  . Stress: Not on file  Relationships  . Social connections:    Talks on phone: Not on file    Gets together: Not on file    Attends religious service: Not on file    Active member of club or organization: Not on file    Attends meetings of clubs or organizations: Not on file    Relationship status: Not on file  . Intimate partner violence:    Fear of current or ex partner: Not on file    Emotionally abused: Not on file    Physically abused: Not on file    Forced sexual activity: Not on file  Other Topics Concern  . Not on file  Social History Narrative   Patient is divorced with 2 children.   Patient is right handed.   Patient has her Associates degree.   Patient drinks 3 cups daily.      [facility] (07/30/17):   Originally from [facility]. Has always lived in [place]. 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 Previously had cockatoo and a [place] for months. Did have mold in her current home that was remediated. Enjoys playing on the computer, reading, & babysitting.       Allergies  Allergen Reactions  . Carvedilol Palpitations  . Dapsone Other (See Comments)    Reaction:  Flushing   . Niacin And Related Other (See Comments)    Reaction:  Flushing   . Septra [Sulfamethoxazole-Trimethoprim]     Nausea, vomiting, diarrhea  . Wellbutrin [Bupropion]     Caused jittery   . Amoxicillin Rash and Other (See Comments)    Has patient had a PCN reaction causing immediate rash, facial/tongue/throat swelling, SOB or lightheadedness with hypotension: No Has patient had a PCN reaction causing severe rash involving mucus membranes or skin necrosis: No Has  patient had a PCN reaction that required hospitalization No Has patient had a PCN reaction occurring within the last 10 years: No If all of the above answers are "NO", then may proceed with Cephalosporin use.     Outpatient Medications Prior to Visit  Medication Sig Dispense Refill  . acetaminophen (TYLENOL) 500 MG tablet Take 1,000 mg by mouth every 6 (six) hours as needed for mild pain, moderate pain, fever or headache.     Marland Kitchen amLODipine (NORVASC) 10 MG tablet Take 5 mg by mouth daily.     Marland Kitchen atenolol (TENORMIN) 100 MG tablet Take 1 tablet (100 mg total) by mouth daily. (Patient taking differently: Take 50 mg by mouth daily. ) 30 tablet 1  . Cholecalciferol (VITAMIN D3) 5000 units CAPS Take 5,000 Units by mouth every evening.     . clopidogrel (PLAVIX) 75 MG tablet Take 75 mg by mouth daily.     . DULoxetine (CYMBALTA) 30 MG capsule Take 90 mg by mouth every evening.    . Ipratropium-Albuterol (COMBIVENT RESPIMAT) 20-100 MCG/ACT AERS respimat Take 1 puff by mouth every 6 (six) hours as needed for wheezing or shortness of breath.     Marland Kitchen ipratropium-albuterol (DUONEB) 0.5-2.5 (3) MG/3ML SOLN Take 3 mLs by nebulization every 6 (six) hours as needed (for wheezing/shortness of breath).    . isosorbide mononitrate (IMDUR) 30 MG 24 hr tablet Take 30 mg by mouth 2 (two) times daily.  0  . lisinopril (PRINIVIL,ZESTRIL) 40 MG tablet Take 40 mg by mouth daily.    Marland Kitchen LORazepam (ATIVAN) 0.5 MG tablet Take 0.5 mg by mouth 2 (two) times daily as needed for anxiety.   0  . pantoprazole (PROTONIX) 40 MG tablet Take 40 mg by mouth 2 (two) times daily.     . ranitidine (ZANTAC) 150 MG tablet Take 1 tablet (150 mg total) 2 (two) times daily by mouth. 30 tablet 3  . simvastatin (ZOCOR) 10 MG tablet Take 1 tablet (10 mg total) by mouth at bedtime. 30 tablet 3  . traMADol (ULTRAM) 50 MG tablet Take 50 mg by mouth every 6 (six) hours as needed for moderate pain.     Marland Kitchen umeclidinium-vilanterol (ANORO ELLIPTA) 62.5-25  MCG/INH AEPB Inhale 1 puff daily into the lungs. 1 each 3   No facility-administered medications prior to visit.     ROS  Objective:  Physical Exam   There were no vitals filed for this visit.  ***  CBC    Component Value Date/Time   WBC 10.8 (H) 01/30/2017 2008   RBC 3.70 (L) 01/30/2017 2008   HGB 11.4 (L) 01/30/2017 2008   HCT 34.1 (L) 01/30/2017 2008   PLT 270 01/30/2017 2008   MCV 92.2 01/30/2017 2008   MCV 96.0 01/02/2014 1825   MCH 30.8 01/30/2017 2008   MCHC 33.4 01/30/2017 2008   RDW 12.8 01/30/2017 2008   LYMPHSABS 1.6 12/26/2014 1119   MONOABS 0.4 12/26/2014 1119   EOSABS 0.3 12/26/2014 1119   BASOSABS 0.1 12/26/2014 1119     PFT 09/01/17: FVC 1.92 L (61%) FEV1 1.00 L (42%) FEV1/FVC 0.52 FEF 25-75 0.43 L (20%) positive bronchodilator response TLC 5.89 L (114%) RV 175% ERV 39% DLC uncorrected 54%  IMAGING V/Q SCAN 01/31/17 (per radiologist):  Low probability for pulmonary embolus with bilateral matched defects.  BILATERAL LOWER EXTREMITY VENOUS DUPLEX 01/31/17 (per vascular surgery):  No DVT or SVT.  CXR PA/LAT 01/30/17 (previously reviewed by me):  No parenchymal mass or opacity appreciated. No pleural effusion. Low lung volumes. Heart normal in size & mediastinum normal in contour.  LABS 07/30/17 Alpha-1 antitrypsin: MM (162) IgG: 1145 IgM: 727 IgE: 15 RAST panel: Negative         Assessment & Plan:   No diagnosis found.  Discussion: ***    Current Outpatient Medications:  .  acetaminophen (TYLENOL) 500 MG tablet, Take 1,000 mg by mouth every 6 (six) hours as needed for mild pain, moderate pain, fever or headache. , Disp: , Rfl:  .  amLODipine (NORVASC) 10 MG tablet, Take 5 mg by mouth daily. , Disp: , Rfl:  .  atenolol (TENORMIN) 100 MG tablet, Take 1 tablet (100 mg total) by mouth daily. (Patient taking differently: Take 50 mg by mouth daily. ), Disp: 30 tablet, Rfl: 1 .  Cholecalciferol (VITAMIN D3) 5000 units CAPS, Take 5,000  Units by mouth every evening. , Disp: , Rfl:  .  clopidogrel (PLAVIX) 75 MG tablet, Take 75 mg by mouth daily. , Disp: , Rfl:  .  DULoxetine (CYMBALTA) 30 MG capsule, Take 90 mg by mouth every evening., Disp: , Rfl:  .  Ipratropium-Albuterol (COMBIVENT RESPIMAT) 20-100 MCG/ACT AERS respimat, Take 1 puff by mouth every 6 (six) hours as needed for wheezing or shortness of breath. , Disp: , Rfl:  .  ipratropium-albuterol (DUONEB) 0.5-2.5 (3) MG/3ML SOLN, Take 3 mLs by nebulization every 6 (six) hours as needed (for wheezing/shortness of breath)., Disp: , Rfl:  .  isosorbide mononitrate (IMDUR) 30 MG 24 hr tablet, Take 30 mg by mouth 2 (two) times daily., Disp: , Rfl: 0 .  lisinopril (PRINIVIL,ZESTRIL) 40 MG tablet, Take 40 mg by mouth daily., Disp: , Rfl:  .  LORazepam (ATIVAN) 0.5 MG tablet, Take 0.5 mg by mouth 2 (two) times daily as needed for anxiety. , Disp: , Rfl: 0 .  pantoprazole (PROTONIX) 40 MG tablet, Take 40 mg by mouth 2 (two) times daily. , Disp: , Rfl:  .  ranitidine (ZANTAC) 150 MG tablet, Take 1 tablet (150 mg total) 2 (two) times daily by mouth., Disp: 30 tablet, Rfl: 3 .  simvastatin (ZOCOR) 10 MG tablet, Take 1 tablet (10 mg total) by mouth at bedtime., Disp: 30 tablet, Rfl: 3 .  traMADol (ULTRAM) 50 MG tablet, Take 50 mg by mouth every 6 (six) hours as needed for moderate pain. ,  Disp: , Rfl:  .  umeclidinium-vilanterol (ANORO ELLIPTA) 62.5-25 MCG/INH AEPB, Inhale 1 puff daily into the lungs., Disp: 1 each, Rfl: 3

## 2018-03-13 ENCOUNTER — Encounter: Payer: Self-pay | Admitting: Family Medicine

## 2018-03-13 ENCOUNTER — Encounter: Payer: Self-pay | Admitting: Pulmonary Disease

## 2018-03-13 ENCOUNTER — Ambulatory Visit (INDEPENDENT_AMBULATORY_CARE_PROVIDER_SITE_OTHER): Payer: Medicare Other | Admitting: Pulmonary Disease

## 2018-03-13 VITALS — BP 134/78 | HR 91 | Ht 65.0 in | Wt 206.0 lb

## 2018-03-13 DIAGNOSIS — J449 Chronic obstructive pulmonary disease, unspecified: Secondary | ICD-10-CM | POA: Diagnosis not present

## 2018-03-13 DIAGNOSIS — K219 Gastro-esophageal reflux disease without esophagitis: Secondary | ICD-10-CM | POA: Diagnosis not present

## 2018-03-13 DIAGNOSIS — J309 Allergic rhinitis, unspecified: Secondary | ICD-10-CM | POA: Diagnosis not present

## 2018-03-13 MED ORDER — UMECLIDINIUM-VILANTEROL 62.5-25 MCG/INH IN AEPB: 1.0000 | INHALATION_SPRAY | Freq: Every day | RESPIRATORY_TRACT | 5 refills | Status: DC

## 2018-03-13 NOTE — Addendum Note (Signed)
Addended by: Dolores Lory on: 03/13/2018 12:00 PM   Modules accepted: Orders

## 2018-03-13 NOTE — Patient Instructions (Addendum)
COPD: Gold grade C disease Continue Anoro 1 puff daily no matter how you feel Use albuterol as needed for chest tightness wheezing or shortness of breath  Prior tobacco use: We will refer you to the lung cancer screening program  Follow-up with me in 6 months or sooner if needed

## 2018-03-13 NOTE — Progress Notes (Signed)
Synopsis: Former patient of Dr. Ashok Cordia with COPD  Subjective:   PATIENT ID: Shannon Roberts GENDER: female DOB: Aug 27, 1951, MRN: [id number]   HPI  Chief Complaint  Patient presents with  . Follow-up    former [facility] patient, COPD   Shannon Roberts is here to establish care for me her COPD: > some days are better than others > she says that anxiety comes on when she feels short of breath > hot weather makes her breathing worse > she says that when she feels short of breath she feels abnormal in her epigastrium  > She says that about once per year year she has a flare of COPD, none more than that > she says her PCP tries to avoid given her diabetes > she says that she coughs up mucus from time to time, not too often  Quit in 12/2013, 1-1.5 ppd since age 13, about 70 - 50 years total.  She takes Anoro daily.   She says that she has a hard time falling to sleep.  She is sleepy a lot at night She has headaches sometimes in the mornings.  DOesn't want to do a sleep study.   Past Medical History:  Diagnosis Date  . Allergic rhinitis   . Anemia    per hosp. - recently- pt. told that she is anemic  . Anxiety   . Colitis   . COPD (chronic obstructive pulmonary disease) (HCC)    no o2  . Depression   . Diverticulosis   . Female bladder prolapse   . Fibroids   . GERD (gastroesophageal reflux disease)   . Heart murmur   . Hyperlipemia   . Hypertension   . IBS (irritable bowel syndrome)   . Insomnia   . Mitral valve prolapse   . Peripheral vascular disease ([place])   . Sciatic nerve disease   . Shortness of breath   . Stroke [facility]) March 2015     Family History  Problem Relation Age of Onset  . Breast cancer Mother   . Ovarian cancer Mother   . AAA (abdominal aortic aneurysm) Mother   . Emphysema Mother   . Lung cancer Father   . Colon cancer Paternal Aunt   . Emphysema Paternal Aunt   . Pancreatic cancer Maternal Grandmother   . Diabetes Maternal Aunt   . Breast cancer Maternal  Aunt   . Heart disease Paternal Grandfather   . Diabetes Paternal Grandfather   . Kidney disease Maternal Aunt      Social History   Socioeconomic History  . Marital status: Divorced    Spouse name: Not on file  . Number of children: 2  . Years of education: ASSOCIATES  . Highest education level: Not on file  Occupational History  . Occupation: Print production planner  . Financial resource strain: Not on file  . Food insecurity:    Worry: Not on file    Inability: Not on file  . Transportation needs:    Medical: Not on file    Non-medical: Not on file  Tobacco Use  . Smoking status: Former Smoker    Packs/day: 1.50    Years: 47.00    Pack years: 70.50    Types: Cigarettes    Start date: 12/03/1966    Last attempt to quit: 01/02/2014    Years since quitting: 4.1  . Smokeless tobacco: Never Used  . Tobacco comment: Stopped for 6 months once in 2007  Substance and Sexual Activity  .  Alcohol use: No    Alcohol/week: 0.0 oz  . Drug use: No  . Sexual activity: Not Currently  Lifestyle  . Physical activity:    Days per week: Not on file    Minutes per session: Not on file  . Stress: Not on file  Relationships  . Social connections:    Talks on phone: Not on file    Gets together: Not on file    Attends religious service: Not on file    Active member of club or organization: Not on file    Attends meetings of clubs or organizations: Not on file    Relationship status: Not on file  . Intimate partner violence:    Fear of current or ex partner: Not on file    Emotionally abused: Not on file    Physically abused: Not on file    Forced sexual activity: Not on file  Other Topics Concern  . Not on file  Social History Narrative   Patient is divorced with 2 children.   Patient is right handed.   Patient has her Associates degree.   Patient drinks 3 cups daily.      [facility] (07/30/17):   Originally from [place]. Has always lived in [place]. Previously worked as a  Radiation protection practitioner. Has a foster hospice dog. Previously had cockatoo and a [place] for months. Did have mold in her current home that was remediated. Enjoys playing on the computer, reading, & babysitting.       Allergies  Allergen Reactions  . Carvedilol Palpitations  . Dapsone Other (See Comments)    Reaction:  Flushing   . Niacin And Related Other (See Comments)    Reaction:  Flushing   . Septra [Sulfamethoxazole-Trimethoprim]     Nausea, vomiting, diarrhea  . Wellbutrin [Bupropion]     Caused jittery   . Amoxicillin Rash and Other (See Comments)    Has patient had a PCN reaction causing immediate rash, facial/tongue/throat swelling, SOB or lightheadedness with hypotension: No Has patient had a PCN reaction causing severe rash involving mucus membranes or skin necrosis: No Has patient had a PCN reaction that required hospitalization No Has patient had a PCN reaction occurring within the last 10 years: No If all of the above answers are "NO", then may proceed with Cephalosporin use.     Outpatient Medications Prior to Visit  Medication Sig Dispense Refill  . acetaminophen (TYLENOL) 500 MG tablet Take 1,000 mg by mouth every 6 (six) hours as needed for mild pain, moderate pain, fever or headache.     Marland Kitchen amLODipine (NORVASC) 10 MG tablet Take 5 mg by mouth daily.     Marland Kitchen atenolol (TENORMIN) 100 MG tablet Take 1 tablet (100 mg total) by mouth daily. (Patient taking differently: Take 50 mg by mouth daily. ) 30 tablet 1  . Cholecalciferol (VITAMIN D3) 5000 units CAPS Take 5,000 Units by mouth every evening.     . clopidogrel (PLAVIX) 75 MG tablet Take 75 mg by mouth daily.     . DULoxetine (CYMBALTA) 30 MG capsule Take 90 mg by mouth every evening.    . Ipratropium-Albuterol (COMBIVENT RESPIMAT) 20-100 MCG/ACT AERS respimat Take 1 puff by mouth every 6 (six) hours as needed for wheezing or shortness of breath.     Marland Kitchen ipratropium-albuterol (DUONEB) 0.5-2.5 (3) MG/3ML SOLN Take 3 mLs by nebulization  every 6 (six) hours as needed (for wheezing/shortness of breath).    . isosorbide mononitrate (IMDUR) 30 MG 24 hr tablet  Take 30 mg by mouth 2 (two) times daily.  0  . lisinopril (PRINIVIL,ZESTRIL) 40 MG tablet Take 40 mg by mouth daily.    Marland Kitchen LORazepam (ATIVAN) 0.5 MG tablet Take 0.5 mg by mouth 2 (two) times daily as needed for anxiety.   0  . pantoprazole (PROTONIX) 40 MG tablet Take 40 mg by mouth daily.     . ranitidine (ZANTAC) 150 MG tablet Take 1 tablet (150 mg total) 2 (two) times daily by mouth. (Patient taking differently: Take 150 mg by mouth at bedtime. ) 30 tablet 3  . simvastatin (ZOCOR) 10 MG tablet Take 1 tablet (10 mg total) by mouth at bedtime. 30 tablet 3  . traMADol (ULTRAM) 50 MG tablet Take 50 mg by mouth every 6 (six) hours as needed for moderate pain.     Marland Kitchen umeclidinium-vilanterol (ANORO ELLIPTA) 62.5-25 MCG/INH AEPB Inhale 1 puff daily into the lungs. 1 each 3   No facility-administered medications prior to visit.     Review of Systems  Constitutional: Negative for chills, fever, malaise/fatigue and weight loss.  HENT: Negative for congestion, nosebleeds, sinus pain and sore throat.   Eyes: Negative for photophobia, pain and discharge.  Respiratory: Positive for cough, sputum production and shortness of breath. Negative for hemoptysis and wheezing.   Cardiovascular: Negative for chest pain, palpitations, orthopnea and leg swelling.  Gastrointestinal: Negative for abdominal pain, constipation, diarrhea, nausea and vomiting.  Genitourinary: Negative for dysuria, frequency, hematuria and urgency.  Musculoskeletal: Negative for back pain, joint pain, myalgias and neck pain.  Skin: Negative for itching and rash.  Neurological: Negative for tingling, tremors, sensory change, speech change, focal weakness, seizures, weakness and headaches.  Psychiatric/Behavioral: Negative for memory loss, substance abuse and suicidal ideas. The patient is not nervous/anxious.        Objective:  Physical Exam   Vitals:   03/13/18 1112  BP: 134/78  Pulse: 91  SpO2: 98%  Weight: 206 lb (93.4 kg)  Height: 5\' 5"  (1.651 m)   RA  Gen: well appearing HENT: OP clear, TM's clear, neck supple PULM: Poor air movement B, normal percussion CV: RRR, no mgr, trace edema GI: BS+, soft, nontender Derm: no cyanosis or rash Psyche: normal mood and affect   CBC    Component Value Date/Time   WBC 10.8 (H) 01/30/2017 2008   RBC 3.70 (L) 01/30/2017 2008   HGB 11.4 (L) 01/30/2017 2008   HCT 34.1 (L) 01/30/2017 2008   PLT 270 01/30/2017 2008   MCV 92.2 01/30/2017 2008   MCV 96.0 01/02/2014 1825   MCH 30.8 01/30/2017 2008   MCHC 33.4 01/30/2017 2008   RDW 12.8 01/30/2017 2008   LYMPHSABS 1.6 12/26/2014 1119   MONOABS 0.4 12/26/2014 1119   EOSABS 0.3 12/26/2014 1119   BASOSABS 0.1 12/26/2014 1119     PFT 09/01/17: FVC 1.92 L (61%) FEV1 1.00 L (42%) FEV1/FVC 0.52 FEF 25-75 0.43 L (20%) positive bronchodilator response TLC 5.89 L (114%) RV 175% ERV 39% DLC uncorrected 54%  IMAGING V/Q SCAN 01/31/17 (per radiologist):  Low probability for pulmonary embolus with bilateral matched defects.  BILATERAL LOWER EXTREMITY VENOUS DUPLEX 01/31/17 (per vascular surgery):  No DVT or SVT.  CXR PA/LAT 01/30/17: personally reviewed, increased AP diameter, no pulmonary parenchymal abnormality  LABS 07/30/17 Alpha-1 antitrypsin: MM (162) IgG: 1145 IgM: 727 IgE: 15 RAST panel: Negative      Assessment & Plan:   COPD, severe ([place]) - Plan: Ambulatory referral to Pulmonology  Chronic allergic rhinitis  Gastroesophageal reflux disease, esophagitis presence not specified  Discussion: Ms. Limpert has severe airflow obstruction but I am pleased by the fact that she does not have frequent exacerbations.  She stays fairly active.  She is compliant with Anoro.  She has a lot of questions today about disease progression and staging.  I told her that I feel she has gold  grade C COPD and that she has significant airflow obstruction but she does not have recurrent exacerbations or symptoms of chronic bronchitis.  Plan: COPD: Gold grade C disease Continue Anoro 1 puff daily no matter how you feel Use albuterol as needed for chest tightness wheezing or shortness of breath  Prior tobacco use: We will refer you to the lung cancer screening program  Follow-up with me in 6 months or sooner if needed  Greater than 25 minutes spent with patient, 30-minute visit      Current Outpatient Medications:  .  acetaminophen (TYLENOL) 500 MG tablet, Take 1,000 mg by mouth every 6 (six) hours as needed for mild pain, moderate pain, fever or headache. , Disp: , Rfl:  .  amLODipine (NORVASC) 10 MG tablet, Take 5 mg by mouth daily. , Disp: , Rfl:  .  atenolol (TENORMIN) 100 MG tablet, Take 1 tablet (100 mg total) by mouth daily. (Patient taking differently: Take 50 mg by mouth daily. ), Disp: 30 tablet, Rfl: 1 .  Cholecalciferol (VITAMIN D3) 5000 units CAPS, Take 5,000 Units by mouth every evening. , Disp: , Rfl:  .  clopidogrel (PLAVIX) 75 MG tablet, Take 75 mg by mouth daily. , Disp: , Rfl:  .  DULoxetine (CYMBALTA) 30 MG capsule, Take 90 mg by mouth every evening., Disp: , Rfl:  .  Ipratropium-Albuterol (COMBIVENT RESPIMAT) 20-100 MCG/ACT AERS respimat, Take 1 puff by mouth every 6 (six) hours as needed for wheezing or shortness of breath. , Disp: , Rfl:  .  ipratropium-albuterol (DUONEB) 0.5-2.5 (3) MG/3ML SOLN, Take 3 mLs by nebulization every 6 (six) hours as needed (for wheezing/shortness of breath)., Disp: , Rfl:  .  isosorbide mononitrate (IMDUR) 30 MG 24 hr tablet, Take 30 mg by mouth 2 (two) times daily., Disp: , Rfl: 0 .  lisinopril (PRINIVIL,ZESTRIL) 40 MG tablet, Take 40 mg by mouth daily., Disp: , Rfl:  .  LORazepam (ATIVAN) 0.5 MG tablet, Take 0.5 mg by mouth 2 (two) times daily as needed for anxiety. , Disp: , Rfl: 0 .  pantoprazole (PROTONIX) 40 MG tablet,  Take 40 mg by mouth daily. , Disp: , Rfl:  .  ranitidine (ZANTAC) 150 MG tablet, Take 1 tablet (150 mg total) 2 (two) times daily by mouth. (Patient taking differently: Take 150 mg by mouth at bedtime. ), Disp: 30 tablet, Rfl: 3 .  simvastatin (ZOCOR) 10 MG tablet, Take 1 tablet (10 mg total) by mouth at bedtime., Disp: 30 tablet, Rfl: 3 .  traMADol (ULTRAM) 50 MG tablet, Take 50 mg by mouth every 6 (six) hours as needed for moderate pain. , Disp: , Rfl:  .  umeclidinium-vilanterol (ANORO ELLIPTA) 62.5-25 MCG/INH AEPB, Inhale 1 puff daily into the lungs., Disp: 1 each, Rfl: 3

## 2018-03-26 ENCOUNTER — Other Ambulatory Visit: Payer: Self-pay | Admitting: Pulmonary Disease

## 2018-03-26 MED ORDER — UMECLIDINIUM-VILANTEROL 62.5-25 MCG/INH IN AEPB: 1.0000 | INHALATION_SPRAY | Freq: Every day | RESPIRATORY_TRACT | 5 refills | Status: DC

## 2018-04-17 ENCOUNTER — Telehealth: Payer: Self-pay | Admitting: Pulmonary Disease

## 2018-04-17 NOTE — Telephone Encounter (Signed)
Called and spoke with patient regarding letter to her landlord. Pt reports her roof is leaking, causing black mold in her home. This has caused an increase in COPD flares, and could worsen her health conditions. Pt is requesting a letter be send from our office regarding these concerns and issues.  Routing message to Burman Nieves and BQ for review.  BQ please advise.

## 2018-04-17 NOTE — Telephone Encounter (Signed)
Attempted to call patient today regarding letter from BQ. Pt is requesting a letter be sent to her landlord stating that she is disabled with CODP,  With her leaking roof has caused mold is making her health more at risk. I did not receive an answer at time of call. I have left a voicemail message for pt to return call. X1

## 2018-04-17 NOTE — Telephone Encounter (Signed)
Patient returning calll, CB is [phone]

## 2018-04-20 ENCOUNTER — Encounter: Payer: Self-pay | Admitting: *Deleted

## 2018-04-20 NOTE — Telephone Encounter (Signed)
Patient returned call.  She asked that we mail this to her home address on file, which I verified was correct.  No call back is needed.

## 2018-04-20 NOTE — Telephone Encounter (Signed)
Okay  To whom it may concern:  Ms. Fulp has been followed by the [facility] critical care service recently for repeated exacerbations of chronic obstructive pulmonary disease.  This is a very serious problem and can ultimately lead to more disability or even death.  We are concerned that her living environment may be contributing to the severity and frequency of her exacerbations as we have been notified that she has mold in her home.  To the extent capable, it is her strongest recommendation that either the mold be removed or she be relocated to a cleaner environment.  Sincerely,  Roselie Awkward MD

## 2018-04-20 NOTE — Telephone Encounter (Signed)
Letter has been placed in mail for patient.

## 2018-04-20 NOTE — Telephone Encounter (Signed)
Letter has been drafted and stamped with BQ's signature.  Attempted to call pt. I did not receive an answer. I have left a message for pt to return our call.   We need to know if the pt wants to pick up this letter or have it mailed.  **Letter has been hung on the white board in triage.**

## 2018-04-20 NOTE — Telephone Encounter (Signed)
BQ please advise if we can write this letter. Thanks.

## 2018-04-27 DIAGNOSIS — Z0000 Encounter for general adult medical examination without abnormal findings: Secondary | ICD-10-CM | POA: Diagnosis not present

## 2018-05-01 ENCOUNTER — Telehealth: Payer: Self-pay | Admitting: Internal Medicine

## 2018-05-01 NOTE — Telephone Encounter (Signed)
OK by me - she may need to wait a long tinme so would offer her another MD with sooner appt if she wants

## 2018-05-04 NOTE — Telephone Encounter (Signed)
Left message for patient to return my call.

## 2018-05-27 ENCOUNTER — Ambulatory Visit: Payer: Medicare Other | Admitting: Pulmonary Disease

## 2018-07-31 DIAGNOSIS — M549 Dorsalgia, unspecified: Secondary | ICD-10-CM | POA: Diagnosis not present

## 2018-07-31 DIAGNOSIS — Z23 Encounter for immunization: Secondary | ICD-10-CM | POA: Diagnosis not present

## 2018-07-31 DIAGNOSIS — E1169 Type 2 diabetes mellitus with other specified complication: Secondary | ICD-10-CM | POA: Diagnosis not present

## 2018-07-31 DIAGNOSIS — F419 Anxiety disorder, unspecified: Secondary | ICD-10-CM | POA: Diagnosis not present

## 2018-08-07 ENCOUNTER — Ambulatory Visit: Payer: Medicare Other | Admitting: Pulmonary Disease

## 2018-08-26 ENCOUNTER — Other Ambulatory Visit: Payer: Self-pay | Admitting: Pulmonary Disease

## 2018-08-26 DIAGNOSIS — M7020 Olecranon bursitis, unspecified elbow: Secondary | ICD-10-CM | POA: Diagnosis not present

## 2018-08-26 DIAGNOSIS — M7710 Lateral epicondylitis, unspecified elbow: Secondary | ICD-10-CM | POA: Diagnosis not present

## 2018-08-26 MED ORDER — RANITIDINE HCL 150 MG PO TABS: 150.0000 mg | ORAL_TABLET | Freq: Two times a day (BID) | ORAL | 3 refills | Status: DC

## 2018-08-26 NOTE — Telephone Encounter (Signed)
Received faxed refill request from [facility] for Zantac 150mg  BID written under Dr Ashok Cordia Patient is now established with Dr Lake Bells - last office visit 5.24.19 Rx sent with 3 refills

## 2018-08-31 ENCOUNTER — Encounter: Payer: Self-pay | Admitting: Adult Health

## 2018-08-31 ENCOUNTER — Ambulatory Visit: Payer: Medicare Other | Admitting: Adult Health

## 2018-08-31 VITALS — BP 116/68 | HR 79 | Ht 65.0 in | Wt 201.8 lb

## 2018-08-31 DIAGNOSIS — Z87891 Personal history of nicotine dependence: Secondary | ICD-10-CM

## 2018-08-31 DIAGNOSIS — J449 Chronic obstructive pulmonary disease, unspecified: Secondary | ICD-10-CM

## 2018-08-31 NOTE — Progress Notes (Signed)
@Patient  ID: Shannon Roberts, female    DOB: [birth year], 67 y.o.   MRN: [id number]  Chief Complaint  Patient presents with  . Follow-up    COPD    Referring provider: Lawerance Cruel, MD  HPI: 67 year old female former smoker followed for COPD with reversibility  TEST/EVENTS :  PFT 09/01/17:FVC 1.92 L (61%) FEV1 1.00 L (42%) FEV1/FVC 0.52 FEF 25-75 0.43 L (20%) positive bronchodilator response TLC 5.89 L (114%) RV 175% ERV 39% DLC uncorrected 54%  IMAGING V/Q SCAN 01/31/17 (per radiologist): Low probability for pulmonary embolus with bilateral matched defects.  07/30/17 Alpha-1 antitrypsin: MM(162) IgG: 1145 IgM: 727 IgE: 15 RAST panel: Negative   08/31/2018 Follow up : COPD  Patient presents for a six-month follow-up.  Patient has underlying severe COPD.  She remains on Anoro daily.  Says overall that her breathing is doing about the same gets winded with heavy activity . Has good and bad days. Jacqulyn Liner does not last all day . Takes albuterol once daily in evenings . Feels this really helps her and gets her thru the whole day . Tries to be active, works out in the yard with light activities.  No flare of cough or wheezing .   Flu shot utd .   She is on Lisinopril and Atenolol . Denies increased cough or wheezing .     Allergies  Allergen Reactions  . Carvedilol Palpitations  . Dapsone Other (See Comments)    Reaction:  Flushing   . Niacin And Related Other (See Comments)    Reaction:  Flushing   . Septra [Sulfamethoxazole-Trimethoprim]     Nausea, vomiting, diarrhea  . Wellbutrin [Bupropion]     Caused jittery   . Amoxicillin Rash and Other (See Comments)    Has patient had a PCN reaction causing immediate rash, facial/tongue/throat swelling, SOB or lightheadedness with hypotension: No Has patient had a PCN reaction causing severe rash involving mucus membranes or skin necrosis: No Has patient had a PCN reaction that required hospitalization No Has  patient had a PCN reaction occurring within the last 10 years: No If all of the above answers are "NO", then may proceed with Cephalosporin use.    Immunization History  Administered Date(s) Administered  . Influenza, High Dose Seasonal PF 07/30/2017, 07/31/2018  . Pneumococcal Conjugate-13 01/31/2016  . Pneumococcal Polysaccharide-23 01/18/2014    Past Medical History:  Diagnosis Date  . Allergic rhinitis   . Anemia    per hosp. - recently- pt. told that she is anemic  . Anxiety   . Colitis   . COPD (chronic obstructive pulmonary disease) (HCC)    no o2  . Depression   . Diverticulosis   . Female bladder prolapse   . Fibroids   . GERD (gastroesophageal reflux disease)   . Heart murmur   . Hyperlipemia   . Hypertension   . IBS (irritable bowel syndrome)   . Insomnia   . Mitral valve prolapse   . Peripheral vascular disease ([place])   . Sciatic nerve disease   . Shortness of breath   . Stroke [facility]) March 2015    Tobacco History: Social History   Tobacco Use  Smoking Status Former Smoker  . Packs/day: 1.50  . Years: 47.00  . Pack years: 70.50  . Types: Cigarettes  . Start date: 12/03/1966  . Last attempt to quit: 01/02/2014  . Years since quitting: 4.6  Smokeless Tobacco Never Used  Tobacco Comment   Stopped for  6 months once in 2007   Counseling given: Not Answered Comment: Stopped for 6 months once in 2007   Outpatient Medications Prior to Visit  Medication Sig Dispense Refill  . acetaminophen (TYLENOL) 500 MG tablet Take 1,000 mg by mouth every 6 (six) hours as needed for mild pain, moderate pain, fever or headache.     Marland Kitchen amLODipine (NORVASC) 10 MG tablet Take 5 mg by mouth daily.     Marland Kitchen atenolol (TENORMIN) 100 MG tablet Take 1 tablet (100 mg total) by mouth daily. (Patient taking differently: Take 50 mg by mouth daily. ) 30 tablet 1  . Cholecalciferol (VITAMIN D3) 5000 units CAPS Take 5,000 Units by mouth every evening.     . clopidogrel (PLAVIX) 75 MG  tablet Take 75 mg by mouth daily.     . DULoxetine (CYMBALTA) 30 MG capsule Take 90 mg by mouth every evening.    . fluticasone (FLONASE) 50 MCG/ACT nasal spray Place 2 sprays into both nostrils daily as needed for allergies or rhinitis.    . Ipratropium-Albuterol (COMBIVENT RESPIMAT) 20-100 MCG/ACT AERS respimat Take 1 puff by mouth every 6 (six) hours as needed for wheezing or shortness of breath.     Marland Kitchen ipratropium-albuterol (DUONEB) 0.5-2.5 (3) MG/3ML SOLN Take 3 mLs by nebulization every 6 (six) hours as needed (for wheezing/shortness of breath).    . isosorbide mononitrate (IMDUR) 30 MG 24 hr tablet Take 30 mg by mouth 2 (two) times daily.  0  . lisinopril (PRINIVIL,ZESTRIL) 40 MG tablet Take 40 mg by mouth daily.    Marland Kitchen LORazepam (ATIVAN) 0.5 MG tablet Take 0.5 mg by mouth 2 (two) times daily as needed for anxiety.   0  . pantoprazole (PROTONIX) 40 MG tablet Take 40 mg by mouth daily.     . ranitidine (ZANTAC) 150 MG tablet Take 1 tablet (150 mg total) by mouth 2 (two) times daily. 30 tablet 3  . simvastatin (ZOCOR) 10 MG tablet Take 1 tablet (10 mg total) by mouth at bedtime. 30 tablet 3  . traMADol (ULTRAM) 50 MG tablet Take 50 mg by mouth every 6 (six) hours as needed for moderate pain.     Marland Kitchen umeclidinium-vilanterol (ANORO ELLIPTA) 62.5-25 MCG/INH AEPB Inhale 1 puff into the lungs daily. 1 each 5   No facility-administered medications prior to visit.      Review of Systems  Constitutional:   No  weight loss, night sweats,  Fevers, chills, + fatigue, or  lassitude.  HEENT:   No headaches,  Difficulty swallowing,  Tooth/dental problems, or  Sore throat,                No sneezing, itching, ear ache, nasal congestion, post nasal drip,   CV:  No chest pain,  Orthopnea, PND, swelling in lower extremities, anasarca, dizziness, palpitations, syncope.   GI  No heartburn, indigestion, abdominal pain, nausea, vomiting, diarrhea, change in bowel habits, loss of appetite, bloody stools.    Resp: .  No chest wall deformity  Skin: no rash or lesions.  GU: no dysuria, change in color of urine, no urgency or frequency.  No flank pain, no hematuria   MS:  No joint pain or swelling.  No decreased range of motion.  No back pain.    Physical Exam  BP 116/68 (BP Location: Left Arm, Cuff Size: Normal)   Pulse 79   Ht 5\' 5"  (1.651 m)   Wt 201 lb 12.8 oz (91.5 kg)   SpO2 99%  BMI 33.58 kg/m   GEN: A/Ox3; pleasant , NAD, elderly    HEENT:  [place]/AT,  EACs-clear, TMs-wnl, NOSE-clear, THROAT-clear, no lesions, no postnasal drip or exudate noted.   NECK:  Supple w/ fair ROM; no JVD; normal carotid impulses w/o bruits; no thyromegaly or nodules palpated; no lymphadenopathy.    RESP  Clear  P & A; w/o, wheezes/ rales/ or rhonchi. no accessory muscle use, no dullness to percussion  CARD:  RRR, no m/r/g, no peripheral edema, pulses intact, no cyanosis or clubbing.  GI:   Soft & nt; nml bowel sounds; no organomegaly or masses detected.   Musco: Warm bil, no deformities or joint swelling noted.   Neuro: alert, no focal deficits noted.    Skin: Warm, no lesions or rashes    Lab Results:    BNP No results found for: BNP  ProBNP No results found for: PROBNP  Imaging: No results found.    PFT Results Latest Ref Rng & Units 09/01/2017  FVC-Pre L 1.92  FVC-Predicted Pre % 61  FVC-Post L 2.23  FVC-Predicted Post % 71  Pre FEV1/FVC % % 52  Post FEV1/FCV % % 56  FEV1-Pre L 1.00  FEV1-Predicted Pre % 42  FEV1-Post L 1.25  DLCO UNC% % 49  DLCO COR %Predicted % 68  TLC L 5.89  TLC % Predicted % 114  RV % Predicted % 175    No results found for: NITRICOXIDE      Assessment & Plan:   COPD, severe (HCC) Controlled on current regimen  She does have late evening symptoms that is relieved with albuterol x 1 , she is pleased with results.  If start to see flares or increased SABA use could consider adding ICS or change to Twice daily  Dosing inhaler.  Would  benefit from pulmonary rehab as has physical deconditioning .   Plan  Patient Instructions  Continue on ANORO 1 puff daily , rinse after use.  Refer to pulmonary rehab .  Refer to [facility] for LDCT Chest screening .  Follow up with Dr. Lake Bells in 6 months and As needed            Rexene Edison, NP 08/31/2018

## 2018-08-31 NOTE — Patient Instructions (Addendum)
Continue on ANORO 1 puff daily , rinse after use.  Refer to pulmonary rehab .  Refer to [facility] for LDCT Chest screening .  Follow up with Dr. Lake Bells in 6 months and As needed

## 2018-08-31 NOTE — Assessment & Plan Note (Signed)
Controlled on current regimen  She does have late evening symptoms that is relieved with albuterol x 1 , she is pleased with results.  If start to see flares or increased SABA use could consider adding ICS or change to Twice daily  Dosing inhaler.  Would benefit from pulmonary rehab as has physical deconditioning .   Plan  Patient Instructions  Continue on ANORO 1 puff daily , rinse after use.  Refer to pulmonary rehab .  Refer to [facility] for LDCT Chest screening .  Follow up with Dr. Lake Bells in 6 months and As needed

## 2018-08-31 NOTE — Progress Notes (Signed)
Reviewed, agree 

## 2018-09-01 NOTE — Addendum Note (Signed)
Addended by: Parke Poisson E on: 09/01/2018 09:28 AM   Modules accepted: Orders

## 2018-09-03 ENCOUNTER — Telehealth (HOSPITAL_COMMUNITY): Payer: Self-pay

## 2018-09-03 NOTE — Telephone Encounter (Signed)
Pt's insurance is active and benefits verified through [employer].  No Co-pay, deductible amount of $0.00/$0.00, out of pocket amount $5,800/$207.16, 20% co-insurance, and no pre-authorization is required unless patient exceeds 36 sessions. Spoke with Rolland Bimler from [facility] - Reference # Sen P 09/03/18

## 2018-09-08 ENCOUNTER — Telehealth (HOSPITAL_COMMUNITY): Payer: Self-pay

## 2018-09-08 NOTE — Telephone Encounter (Signed)
Attempted to call patient in regards to Pulmonary Rehab - LM on VM °

## 2018-09-09 ENCOUNTER — Ambulatory Visit: Payer: Medicare Other | Admitting: Nurse Practitioner

## 2018-09-09 ENCOUNTER — Encounter: Payer: Self-pay | Admitting: Nurse Practitioner

## 2018-09-09 VITALS — BP 128/66 | HR 75 | Ht 65.0 in | Wt 199.4 lb

## 2018-09-09 DIAGNOSIS — J0100 Acute maxillary sinusitis, unspecified: Secondary | ICD-10-CM | POA: Diagnosis not present

## 2018-09-09 DIAGNOSIS — J441 Chronic obstructive pulmonary disease with (acute) exacerbation: Secondary | ICD-10-CM | POA: Insufficient documentation

## 2018-09-09 MED ORDER — METHYLPREDNISOLONE ACETATE 80 MG/ML IJ SUSP
80.0000 mg | Freq: Once | INTRAMUSCULAR | Status: AC
  Administered 2018-09-09: 80 mg via INTRAMUSCULAR

## 2018-09-09 MED ORDER — AZITHROMYCIN 250 MG PO TABS: ORAL_TABLET | ORAL | 0 refills | Status: DC

## 2018-09-09 NOTE — Patient Instructions (Addendum)
Will order azithromycin Will order DepoMedrol to be given in office today Will give samples of mucinex and delsym  General instructions:  Hydrate  Drink enough water to keep your pee (urine) clear or pale yellow. Rest  Rest as much as possible.  Sleep with your head raised (elevated).  Make sure to get enough sleep each night. General instructions  Put a warm, moist washcloth on your face 3-4 times a day or as told by your doctor. This will help with discomfort.  Wash your hands often with soap and water. If there is no soap and water, use hand sanitizer.  Do not smoke. Avoid being around people who are smoking (secondhand smoke). Call the office if:  You have a fever.  Your symptoms get worse.  Your symptoms do not get better within 10 days.  Follow up with Dr. Lake Bells in 6 months or sooner if needed

## 2018-09-09 NOTE — Assessment & Plan Note (Signed)
Continue Anoro DepoMedrol given in office today

## 2018-09-09 NOTE — Progress Notes (Signed)
@Patient  ID: Shannon Roberts, female    DOB: 1951-10-12, 67 y.o.   MRN: [id number]  Chief Complaint  Patient presents with  . Cough    with congestion    Referring provider: Lawerance Cruel, MD  HPI 67 year old female former smoker followed for COPD by Dr. Lake Roberts.   Tests: V/Q SCAN 01/31/17 (per radiologist): Low probability for pulmonary embolus with bilateral matched defects.  07/30/17 Alpha-1 antitrypsin: MM(162) IgG: 1145 IgM: 727 IgE: 15 RAST panel: Negative  PFT Results Latest Ref Rng & Units 09/01/2017  FVC-Pre L 1.92  FVC-Predicted Pre % 61  FVC-Post L 2.23  FVC-Predicted Post % 71  Pre FEV1/FVC % % 52  Post FEV1/FCV % % 56  FEV1-Pre L 1.00  FEV1-Predicted Pre % 42  FEV1-Post L 1.25  DLCO UNC% % 49  DLCO COR %Predicted % 68  TLC L 5.89  TLC % Predicted % 114  RV % Predicted % 175    OV 09/09/18 - Acute sinus congestion and cough Patient presents today with sinus congestion and cough. She states that symptoms started on 09/05/18. She states that her dog ran away and she was out in the cold and rain looking for the dog. States that she then developed a cough, productive of green sputum. She is experiencing sinus congestion, pain, and pressure. She has a postnasal drip. She has been feeling weak and tired since this past Saturday. She denies any fever, chest pain, or edema. She is compliant with Anoro. She has only tried tylenol to help relieve the symptoms with minimal relief noted.    Allergies  Allergen Reactions  . Carvedilol Palpitations  . Dapsone Other (See Comments)    Reaction:  Flushing   . Niacin And Related Other (See Comments)    Reaction:  Flushing   . Septra [Sulfamethoxazole-Trimethoprim]     Nausea, vomiting, diarrhea  . Wellbutrin [Bupropion]     Caused jittery   . Amoxicillin Rash and Other (See Comments)    Has patient had a PCN reaction causing immediate rash, facial/tongue/throat swelling, SOB or lightheadedness with  hypotension: No Has patient had a PCN reaction causing severe rash involving mucus membranes or skin necrosis: No Has patient had a PCN reaction that required hospitalization No Has patient had a PCN reaction occurring within the last 10 years: No If all of the above answers are "NO", then may proceed with Cephalosporin use.    Immunization History  Administered Date(s) Administered  . Influenza, High Dose Seasonal PF 07/30/2017, 07/31/2018  . Pneumococcal Conjugate-13 01/31/2016  . Pneumococcal Polysaccharide-23 01/18/2014    Past Medical History:  Diagnosis Date  . Allergic rhinitis   . Anemia    per hosp. - recently- pt. told that she is anemic  . Anxiety   . Colitis   . COPD (chronic obstructive pulmonary disease) (HCC)    no o2  . Depression   . Diverticulosis   . Female bladder prolapse   . Fibroids   . GERD (gastroesophageal reflux disease)   . Heart murmur   . Hyperlipemia   . Hypertension   . IBS (irritable bowel syndrome)   . Insomnia   . Mitral valve prolapse   . Peripheral vascular disease (Shannon Roberts)   . Sciatic nerve disease   . Shortness of breath   . Stroke Shannon Roberts) March 2015    Tobacco History: Social History   Tobacco Use  Smoking Status Former Smoker  . Packs/day: 1.50  . Years: 47.00  .  Pack years: 70.50  . Types: Cigarettes  . Start date: 12/03/1966  . Last attempt to quit: 01/02/2014  . Years since quitting: 4.6  Smokeless Tobacco Never Used  Tobacco Comment   Stopped for 6 months once in 2007   Counseling given: Yes Comment: Stopped for 6 months once in 2007   Outpatient Encounter Medications as of 09/09/2018  Medication Sig  . acetaminophen (TYLENOL) 500 MG tablet Take 1,000 mg by mouth every 6 (six) hours as needed for mild pain, moderate pain, fever or headache.   . ALPRAZolam (XANAX) 0.25 MG tablet Take 1 tablet by mouth at bedtime as needed.  Marland Kitchen amLODipine (NORVASC) 10 MG tablet Take 5 mg by mouth daily.   Marland Kitchen atenolol (TENORMIN) 100 MG  tablet Take 1 tablet (100 mg total) by mouth daily. (Patient taking differently: Take 50 mg by mouth daily. )  . Cholecalciferol (VITAMIN D3) 5000 units CAPS Take 5,000 Units by mouth every evening.   . clopidogrel (PLAVIX) 75 MG tablet Take 75 mg by mouth daily.   . DULoxetine (CYMBALTA) 30 MG capsule Take 90 mg by mouth every evening.  . fluticasone (FLONASE) 50 MCG/ACT nasal spray Place 2 sprays into both nostrils daily as needed for allergies or rhinitis.  . Ipratropium-Albuterol (COMBIVENT RESPIMAT) 20-100 MCG/ACT AERS respimat Take 1 puff by mouth every 6 (six) hours as needed for wheezing or shortness of breath.   Marland Kitchen ipratropium-albuterol (DUONEB) 0.5-2.5 (3) MG/3ML SOLN Take 3 mLs by nebulization every 6 (six) hours as needed (for wheezing/shortness of breath).  . isosorbide mononitrate (IMDUR) 30 MG 24 hr tablet Take 30 mg by mouth 2 (two) times daily.  Marland Kitchen lisinopril (PRINIVIL,ZESTRIL) 40 MG tablet Take 40 mg by mouth daily.  Marland Kitchen LORazepam (ATIVAN) 0.5 MG tablet Take 0.5 mg by mouth 2 (two) times daily as needed for anxiety.   . pantoprazole (PROTONIX) 40 MG tablet Take 40 mg by mouth daily.   . ranitidine (ZANTAC) 150 MG tablet Take 1 tablet (150 mg total) by mouth 2 (two) times daily.  . simvastatin (ZOCOR) 10 MG tablet Take 1 tablet (10 mg total) by mouth at bedtime.  . traMADol (ULTRAM) 50 MG tablet Take 50 mg by mouth every 6 (six) hours as needed for moderate pain.   Marland Kitchen umeclidinium-vilanterol (ANORO ELLIPTA) 62.5-25 MCG/INH AEPB Inhale 1 puff into the lungs daily.  Marland Kitchen azithromycin (ZITHROMAX) 250 MG tablet Take 2 tablets (500 mg) on day 1, then take 1 tablet (250 mg) on days 2-5  . [EXPIRED] methylPREDNISolone acetate (DEPO-MEDROL) injection 80 mg    No facility-administered encounter medications on file as of 09/09/2018.      Review of Systems  Review of Systems  Constitutional: Negative.  Negative for chills and fever.  HENT: Positive for congestion, postnasal drip, sinus  pressure and sinus pain.   Respiratory: Positive for cough. Negative for shortness of breath and wheezing.   Cardiovascular: Negative.  Negative for chest pain, palpitations and leg swelling.  Gastrointestinal: Negative.   Allergic/Immunologic: Negative.   Neurological: Negative.   Psychiatric/Behavioral: Negative.        Physical Exam  BP 128/66 (BP Location: Left Arm, Patient Position: Sitting, Cuff Size: Normal)   Pulse 75   Ht 5\' 5"  (1.651 m)   Wt 199 lb 6.4 oz (90.4 kg)   SpO2 99%   BMI 33.18 kg/m   Wt Readings from Last 5 Encounters:  09/09/18 199 lb 6.4 oz (90.4 kg)  08/31/18 201 lb 12.8 oz (91.5 kg)  03/13/18 206 lb (93.4 kg)  09/03/17 207 lb 12.8 oz (94.3 kg)  08/12/17 206 lb 4.8 oz (93.6 kg)     Physical Exam  Constitutional: She is oriented to person, place, and time. She appears well-developed and well-nourished. No distress.  Cardiovascular: Normal rate and regular rhythm.  Pulmonary/Chest: Effort normal and breath sounds normal. No respiratory distress. She has no wheezes. She has no rales.  Musculoskeletal: She exhibits no edema.  Neurological: She is alert and oriented to person, place, and time.  Psychiatric: She has a normal mood and affect.  Nursing note and vitals reviewed.      Assessment & Plan:   Acute non-recurrent maxillary sinusitis Will treat for sinusitis considering symptoms  Patient Instructions  Will order azithromycin Will order DepoMedrol to be given in office today Will give samples of mucinex and delsym  General instructions:  Hydrate  Drink enough water to keep your pee (urine) clear or pale yellow. Rest  Rest as much as possible.  Sleep with your head raised (elevated).  Make sure to get enough sleep each night. General instructions  Put a warm, moist washcloth on your face 3-4 times a day or as told by your doctor. This will help with discomfort.  Wash your hands often with soap and water. If there is no soap  and water, use hand sanitizer.  Do not smoke. Avoid being around people who are smoking (secondhand smoke). Call the office if:  You have a fever.  Your symptoms get worse.  Your symptoms do not get better within 10 days.  Follow up with Dr. Lake Roberts in 6 months or sooner if needed    COPD with acute exacerbation ([place]) Continue Anoro DepoMedrol given in office today     Fenton Foy, NP 09/09/2018

## 2018-09-09 NOTE — Assessment & Plan Note (Signed)
Will treat for sinusitis considering symptoms  Patient Instructions  Will order azithromycin Will order DepoMedrol to be given in office today Will give samples of mucinex and delsym  General instructions:  Hydrate  Drink enough water to keep your pee (urine) clear or pale yellow. Rest  Rest as much as possible.  Sleep with your head raised (elevated).  Make sure to get enough sleep each night. General instructions  Put a warm, moist washcloth on your face 3-4 times a day or as told by your doctor. This will help with discomfort.  Wash your hands often with soap and water. If there is no soap and water, use hand sanitizer.  Do not smoke. Avoid being around people who are smoking (secondhand smoke). Call the office if:  You have a fever.  Your symptoms get worse.  Your symptoms do not get better within 10 days.  Follow up with Dr. Lake Bells in 6 months or sooner if needed

## 2018-09-10 NOTE — Progress Notes (Signed)
Reviewed, agree 

## 2018-09-14 ENCOUNTER — Telehealth: Payer: Self-pay | Admitting: Pulmonary Disease

## 2018-09-14 MED ORDER — MOXIFLOXACIN HCL 400 MG PO TABS: 400.0000 mg | ORAL_TABLET | Freq: Every day | ORAL | 0 refills | Status: DC

## 2018-09-14 NOTE — Telephone Encounter (Signed)
I sent in avelox to the pharmacy.

## 2018-09-14 NOTE — Telephone Encounter (Signed)
Called and spoke with patient, she stated that she was given a Zpak and depo shot on 09/09/18 by TN. She finished her zpak yesterday but she is still having a productive cough with green mucus and an "inflammation feeling" in her back. She would like something to be called in.    TN please advise thank you.

## 2018-09-14 NOTE — Telephone Encounter (Signed)
Called and spoke with patient she is aware and verbalized understanding.

## 2018-09-15 ENCOUNTER — Telehealth: Payer: Self-pay | Admitting: Nurse Practitioner

## 2018-09-15 NOTE — Telephone Encounter (Signed)
Called pt in regards to Pulmonary Rehab, pt stated she was at the drug store and will give [place] a call back later.  If pt does not call back will f/u in a week.

## 2018-09-15 NOTE — Telephone Encounter (Signed)
Patient saw Kenney Houseman this morning for sick visit and Rx'd Avelox which is not covered BQ patient - BQ is in [facility] this afternoon  Dr Lake Bells please advise, thank you

## 2018-09-15 NOTE — Telephone Encounter (Signed)
Shannon Roberts put the patient on Avelox but it is not covered by her insurance and cost $120. Needs something else called in.  TP please advise

## 2018-09-16 MED ORDER — DOXYCYCLINE HYCLATE 100 MG PO TABS: 100.0000 mg | ORAL_TABLET | Freq: Two times a day (BID) | ORAL | 0 refills | Status: AC

## 2018-09-16 NOTE — Telephone Encounter (Signed)
Left message informing patient we sent in doxycycline to [facility] on [address] and to call back if she had any problems. Nothing further is needed at this time.

## 2018-09-16 NOTE — Telephone Encounter (Signed)
Doxycycline 100mg po bid x 5 days 

## 2018-09-22 ENCOUNTER — Other Ambulatory Visit: Payer: Self-pay | Admitting: Pulmonary Disease

## 2018-10-08 ENCOUNTER — Telehealth (HOSPITAL_COMMUNITY): Payer: Self-pay | Admitting: Surgery

## 2018-10-08 NOTE — Telephone Encounter (Signed)
Attempted to contact patient to confirm appointments scheduled for 10/09/2018 [facility]

## 2018-10-09 ENCOUNTER — Encounter: Payer: Self-pay | Admitting: Family

## 2018-10-09 ENCOUNTER — Ambulatory Visit: Payer: Medicare Other | Admitting: Family

## 2018-10-09 ENCOUNTER — Ambulatory Visit (HOSPITAL_COMMUNITY)
Admission: RE | Admit: 2018-10-09 | Discharge: 2018-10-09 | Disposition: A | Discharge status: Home / Self Care | Payer: Medicare Other | Source: Ambulatory Visit | Attending: Vascular Surgery | Admitting: Vascular Surgery

## 2018-10-09 VITALS — BP 99/53 | HR 71 | Temp 97.5°F | Resp 12 | Ht 65.0 in | Wt 199.0 lb

## 2018-10-09 DIAGNOSIS — Z9889 Other specified postprocedural states: Secondary | ICD-10-CM | POA: Diagnosis not present

## 2018-10-09 DIAGNOSIS — I6523 Occlusion and stenosis of bilateral carotid arteries: Secondary | ICD-10-CM | POA: Diagnosis not present

## 2018-10-09 NOTE — Progress Notes (Signed)
Chief Complaint: Follow up Extracranial Carotid Artery Stenosis   History of Present Illness  Shannon Roberts is a 67 y.o. female who is s/p right carotid endarterectomy for symptomatic disease on 01/17/2014 by Dr. Donnetta Hutching.  She had a stroke a couple of weeks before her right CEA as manifested by left hand worsening weakness, severe dizziness, left side of facial twitching; denies aphasia, denies monocular loss of vision, denies left arm or leg weakness. She has no residual neurological deficits, has not had any subsequent neurological events.   She states she has right sciatic pain. She denies non healing wounds.  She was evaluated by a cardiologist, had a stress test, states the test result was fine. She was diagnosed with GERD and IBS.   She is scheduled to start pulmonary rehab in January 2019.   Diabetic: states she is borderline Tobacco use: former smoker, quit in March, 2015  Pt meds include: Statin : Yes ASA: No Other anticoagulants/antiplatelets: Plavix    Past Medical History:  Diagnosis Date  . Allergic rhinitis   . Anemia    per hosp. - recently- pt. told that she is anemic  . Anxiety   . Colitis   . COPD (chronic obstructive pulmonary disease) (HCC)    no o2  . Depression   . Diverticulosis   . Female bladder prolapse   . Fibroids   . GERD (gastroesophageal reflux disease)   . Heart murmur   . Hyperlipemia   . Hypertension   . IBS (irritable bowel syndrome)   . Insomnia   . Mitral valve prolapse   . Peripheral vascular disease ([place])   . Sciatic nerve disease   . Shortness of breath   . Stroke [facility]) March 2015    Social History Social History   Tobacco Use  . Smoking status: Former Smoker    Packs/day: 1.50    Years: 47.00    Pack years: 70.50    Types: Cigarettes    Start date: 12/03/1966    Last attempt to quit: 01/02/2014    Years since quitting: 4.7  . Smokeless tobacco: Never Used  . Tobacco comment: Stopped for 6 months once in  2007  Substance Use Topics  . Alcohol use: No    Alcohol/week: 0.0 standard drinks  . Drug use: No    Family History Family History  Problem Relation Age of Onset  . Breast cancer Mother   . Ovarian cancer Mother   . AAA (abdominal aortic aneurysm) Mother   . Emphysema Mother   . Lung cancer Father   . Colon cancer Paternal Aunt   . Emphysema Paternal Aunt   . Pancreatic cancer Maternal Grandmother   . Diabetes Maternal Aunt   . Breast cancer Maternal Aunt   . Heart disease Paternal Grandfather   . Diabetes Paternal Grandfather   . Kidney disease Maternal Aunt     Surgical History Past Surgical History:  Procedure Laterality Date  . CAROTID ENDARTERECTOMY Right January 17, 2014   CE  . CHOLECYSTECTOMY    . COLONOSCOPY    . ENDARTERECTOMY Right 01/17/2014   Procedure: RIGHT CAROTID ARTERY ENDARTERECTOMY WITH DACRON PATCH ANGIOPLASTY;  Surgeon: Rosetta Posner, MD;  Location: [facility] OR;  Service: Vascular;  Laterality: Right;  . UPPER GASTROINTESTINAL ENDOSCOPY      Allergies  Allergen Reactions  . Carvedilol Palpitations  . Dapsone Other (See Comments)    Reaction:  Flushing   . Niacin And Related Other (See Comments)  Reaction:  Flushing   . Septra [Sulfamethoxazole-Trimethoprim]     Nausea, vomiting, diarrhea  . Wellbutrin [Bupropion]     Caused jittery   . Amoxicillin Rash and Other (See Comments)    Has patient had a PCN reaction causing immediate rash, facial/tongue/throat swelling, SOB or lightheadedness with hypotension: No Has patient had a PCN reaction causing severe rash involving mucus membranes or skin necrosis: No Has patient had a PCN reaction that required hospitalization No Has patient had a PCN reaction occurring within the last 10 years: No If all of the above answers are "NO", then may proceed with Cephalosporin use.    Current Outpatient Medications  Medication Sig Dispense Refill  . acetaminophen (TYLENOL) 500 MG tablet Take 1,000 mg by mouth  every 6 (six) hours as needed for mild pain, moderate pain, fever or headache.     . ALPRAZolam (XANAX) 0.25 MG tablet Take 1 tablet by mouth at bedtime as needed.    Marland Kitchen amLODipine (NORVASC) 10 MG tablet Take 5 mg by mouth daily.     Jearl Klinefelter ELLIPTA 62.5-25 MCG/INH AEPB INHALE 1 PUFF INTO THE LUNGS DAILY 1 each 5  . atenolol (TENORMIN) 100 MG tablet Take 1 tablet (100 mg total) by mouth daily. (Patient taking differently: Take 50 mg by mouth daily. ) 30 tablet 1  . Cholecalciferol (VITAMIN D3) 5000 units CAPS Take 5,000 Units by mouth every evening.     . clopidogrel (PLAVIX) 75 MG tablet Take 75 mg by mouth daily.     . DULoxetine (CYMBALTA) 30 MG capsule Take 90 mg by mouth every evening.    . fluticasone (FLONASE) 50 MCG/ACT nasal spray Place 2 sprays into both nostrils daily as needed for allergies or rhinitis.    . Ipratropium-Albuterol (COMBIVENT RESPIMAT) 20-100 MCG/ACT AERS respimat Take 1 puff by mouth every 6 (six) hours as needed for wheezing or shortness of breath.     Marland Kitchen ipratropium-albuterol (DUONEB) 0.5-2.5 (3) MG/3ML SOLN Take 3 mLs by nebulization every 6 (six) hours as needed (for wheezing/shortness of breath).    . isosorbide mononitrate (IMDUR) 30 MG 24 hr tablet Take 30 mg by mouth 2 (two) times daily.  0  . lisinopril (PRINIVIL,ZESTRIL) 40 MG tablet Take 40 mg by mouth daily.    Marland Kitchen LORazepam (ATIVAN) 0.5 MG tablet Take 0.5 mg by mouth 2 (two) times daily as needed for anxiety.   0  . moxifloxacin (AVELOX) 400 MG tablet Take 1 tablet (400 mg total) by mouth daily. 7 tablet 0  . pantoprazole (PROTONIX) 40 MG tablet Take 40 mg by mouth daily.     . ranitidine (ZANTAC) 150 MG tablet Take 1 tablet (150 mg total) by mouth 2 (two) times daily. 30 tablet 3  . simvastatin (ZOCOR) 10 MG tablet Take 1 tablet (10 mg total) by mouth at bedtime. 30 tablet 3  . traMADol (ULTRAM) 50 MG tablet Take 50 mg by mouth every 6 (six) hours as needed for moderate pain.     Marland Kitchen umeclidinium-vilanterol  (ANORO ELLIPTA) 62.5-25 MCG/INH AEPB Inhale 1 puff into the lungs daily. 1 each 5   No current facility-administered medications for this visit.     Review of Systems : See HPI for pertinent positives and negatives.  Physical Examination  Vitals:   10/09/18 1426 10/09/18 1433  BP: 101/60 (!) 99/53  Pulse: 71   Resp: 12   Temp: (!) 97.5 F (36.4 C)   SpO2: 98%   Weight: 199 lb (90.3 kg)  Height: 5\' 5"  (1.651 m)    Body mass index is 33.12 kg/m.  General: WDWN obese female in NAD GAIT: normal Eyes: PERRLA HENT: No gross abnormalities.  Pulmonary:  Respirations are non-labored, adequate air movement in all fields, CTAB, no rales, rhonchi, or wheezes.  Cardiac: regular rhythm, no detected murmur.  VASCULAR EXAM Carotid Bruits Right Left   Negative Negative     Abdominal aortic pulse is not palpable. Radial pulses are 2+ palpable and equal.                                                                                                                            LE Pulses Right Left       POPLITEAL  not palpable   not palpable       POSTERIOR TIBIAL  faintly palpable   faintly palpable        DORSALIS PEDIS      ANTERIOR TIBIAL faintly palpable  faintly palpable     Gastrointestinal: soft, nontender, BS WNL, no r/g, no palpable masses. Musculoskeletal: no muscle atrophy/wasting. M/S 5/5 throughout, extremities without ischemic changes. Skin: No rashes, no ulcers, no cellulitis.   Neurologic:  A&O X 3; appropriate affect, sensation is normal; speech is normal, CN 2-12 intact, pain and light touch intact in extremities, motor exam as listed above. Psychiatric: Normal thought content, mood appropriate to clinical situation.    Assessment: Shannon Roberts is a 67 y.o. female who is s/p right carotid endarterectomy for symptomatic disease on 01/17/2014 by Dr. Donnetta Hutching. She had a stroke a couple of weeks before her right CEA. She has no residual neurological deficits,  has not had any subsequent neurological events.    Her atherosclerotic risk factors include former smoker, pre-diabetes, COPD, and obesity.  She takes a daily statin and Plavix.     DATA  Carotid Duplex (10-09-18): Right Carotid: Non-hemodynamically significant plaque <50% noted in the CCA. The                ECA appears <50% stenosed.                 Patent right carotid endarterectomy with plaque formation without                evidence of restenosis. Left Carotid: Velocities in the left ICA are consistent with a 1-39% stenosis.               Non-hemodynamically significant plaque noted in the CCA. The ECA               appears >50% stenosed. Vertebrals:  Bilateral vertebral arteries demonstrate antegrade flow. Subclavians: Right subclavian artery was stenotic. Right subclavian artery flow              was disturbed. Normal flow hemodynamics were seen in the left              subclavian artery.  No change compared to the  exam on 08-12-17.    Plan: Follow-up in 18 months with Carotid Duplex scan.   I discussed in depth with the patient the nature of atherosclerosis, and emphasized the importance of maximal medical management including strict control of blood pressure, blood glucose, and lipid levels, obtaining regular exercise, and continued cessation of smoking.  The patient is aware that without maximal medical management the underlying atherosclerotic disease process will progress, limiting the benefit of any interventions. The patient was given information about stroke prevention and what symptoms should prompt the patient to seek immediate medical care. Thank you for allowing [place] to participate in this patient's care.  Clemon Chambers, RN, MSN, FNP-C [facility] Office: [phone]  Clinic Physician: Donzetta Matters  10/09/18 2:39 PM

## 2018-10-09 NOTE — Patient Instructions (Signed)

## 2018-10-27 ENCOUNTER — Telehealth (HOSPITAL_COMMUNITY): Payer: Self-pay

## 2018-10-30 ENCOUNTER — Ambulatory Visit (HOSPITAL_COMMUNITY): Payer: Medicare Other

## 2018-11-03 ENCOUNTER — Telehealth (HOSPITAL_COMMUNITY): Payer: Self-pay

## 2018-11-03 NOTE — Telephone Encounter (Signed)
Attempted to call pt for rescheduling of Pulmonary rehab orientation that was missed from 10/30/18. Left message requesting callback.  Joycelyn Man RN, BSN Cardiac and Pulmonary Rehab RN

## 2018-11-17 DIAGNOSIS — J0110 Acute frontal sinusitis, unspecified: Secondary | ICD-10-CM | POA: Diagnosis not present

## 2018-11-24 ENCOUNTER — Telehealth (HOSPITAL_COMMUNITY): Payer: Self-pay

## 2018-11-24 NOTE — Telephone Encounter (Signed)
Pt called to rescheduled for PR, pt will come in for orientation on 12/11/2018 @ 130PM and will attend the 130 class time.

## 2018-12-07 ENCOUNTER — Telehealth: Payer: Self-pay | Admitting: Adult Health

## 2018-12-07 NOTE — Telephone Encounter (Signed)
FYI:  Due to multiple unsuccessful attempts to contact pt to schedule [facility] and Ct this referral has been cancelled.  Letter sent to pt to make her aware.

## 2018-12-11 ENCOUNTER — Telehealth (HOSPITAL_COMMUNITY): Payer: Self-pay

## 2018-12-11 ENCOUNTER — Ambulatory Visit (HOSPITAL_COMMUNITY): Payer: Medicare Other

## 2018-12-11 NOTE — Telephone Encounter (Signed)
Pt called and had to reschedule for PR. Pt will come in for orientation 01/08/2019 @ 130PM.

## 2019-01-04 ENCOUNTER — Telehealth (HOSPITAL_COMMUNITY): Payer: Self-pay

## 2019-01-04 NOTE — Telephone Encounter (Signed)
Pt called and message left informing pt of Cardiac and Pulmonary rehab closure for the next 2 weeks. Informed pt she will be contacted to reschedule her orientation appointment.  Joycelyn Man RN, BSN Cardiac and Pulmonary Rehab RN

## 2019-01-08 ENCOUNTER — Ambulatory Visit (HOSPITAL_COMMUNITY): Payer: Medicare Other

## 2019-02-10 DIAGNOSIS — F419 Anxiety disorder, unspecified: Secondary | ICD-10-CM | POA: Diagnosis not present

## 2019-02-10 DIAGNOSIS — I10 Essential (primary) hypertension: Secondary | ICD-10-CM | POA: Diagnosis not present

## 2019-02-10 DIAGNOSIS — Z0000 Encounter for general adult medical examination without abnormal findings: Secondary | ICD-10-CM | POA: Diagnosis not present

## 2019-02-10 DIAGNOSIS — E782 Mixed hyperlipidemia: Secondary | ICD-10-CM | POA: Diagnosis not present

## 2019-02-16 ENCOUNTER — Telehealth (HOSPITAL_COMMUNITY): Payer: Self-pay | Admitting: *Deleted

## 2019-02-22 ENCOUNTER — Telehealth: Payer: Self-pay | Admitting: Pulmonary Disease

## 2019-02-22 MED ORDER — UMECLIDINIUM-VILANTEROL 62.5-25 MCG/INH IN AEPB: 1.0000 | INHALATION_SPRAY | Freq: Every day | RESPIRATORY_TRACT | 2 refills | Status: DC

## 2019-02-22 NOTE — Telephone Encounter (Signed)
Rx refill submitted Nothing further needed

## 2019-02-22 NOTE — Telephone Encounter (Signed)
Left message making patient aware refills sent x 20mos. Nothing further needed.

## 2019-03-24 DIAGNOSIS — R109 Unspecified abdominal pain: Secondary | ICD-10-CM | POA: Diagnosis not present

## 2019-03-24 DIAGNOSIS — F419 Anxiety disorder, unspecified: Secondary | ICD-10-CM | POA: Diagnosis not present

## 2019-03-24 DIAGNOSIS — M5430 Sciatica, unspecified side: Secondary | ICD-10-CM | POA: Diagnosis not present

## 2019-05-04 ENCOUNTER — Telehealth (HOSPITAL_COMMUNITY): Payer: Self-pay

## 2019-05-11 DIAGNOSIS — R197 Diarrhea, unspecified: Secondary | ICD-10-CM | POA: Diagnosis not present

## 2019-05-11 DIAGNOSIS — R195 Other fecal abnormalities: Secondary | ICD-10-CM | POA: Diagnosis not present

## 2019-05-12 ENCOUNTER — Telehealth (HOSPITAL_COMMUNITY): Payer: Self-pay

## 2019-05-14 DIAGNOSIS — R350 Frequency of micturition: Secondary | ICD-10-CM | POA: Diagnosis not present

## 2019-05-25 ENCOUNTER — Telehealth (HOSPITAL_COMMUNITY): Payer: Self-pay | Admitting: Family Medicine

## 2019-06-08 DIAGNOSIS — Z0000 Encounter for general adult medical examination without abnormal findings: Secondary | ICD-10-CM | POA: Diagnosis not present

## 2019-06-08 DIAGNOSIS — M5430 Sciatica, unspecified side: Secondary | ICD-10-CM | POA: Diagnosis not present

## 2019-06-08 DIAGNOSIS — R350 Frequency of micturition: Secondary | ICD-10-CM | POA: Diagnosis not present

## 2019-06-08 DIAGNOSIS — E782 Mixed hyperlipidemia: Secondary | ICD-10-CM | POA: Diagnosis not present

## 2019-06-16 DIAGNOSIS — F419 Anxiety disorder, unspecified: Secondary | ICD-10-CM | POA: Diagnosis not present

## 2019-06-16 DIAGNOSIS — I10 Essential (primary) hypertension: Secondary | ICD-10-CM | POA: Diagnosis not present

## 2019-06-16 DIAGNOSIS — E782 Mixed hyperlipidemia: Secondary | ICD-10-CM | POA: Diagnosis not present

## 2019-06-16 DIAGNOSIS — I679 Cerebrovascular disease, unspecified: Secondary | ICD-10-CM | POA: Diagnosis not present

## 2019-07-05 ENCOUNTER — Ambulatory Visit (INDEPENDENT_AMBULATORY_CARE_PROVIDER_SITE_OTHER): Payer: Medicare Other | Admitting: Gastroenterology

## 2019-07-05 ENCOUNTER — Other Ambulatory Visit (INDEPENDENT_AMBULATORY_CARE_PROVIDER_SITE_OTHER): Payer: Medicare Other

## 2019-07-05 ENCOUNTER — Encounter: Payer: Self-pay | Admitting: Gastroenterology

## 2019-07-05 VITALS — BP 116/70 | HR 80 | Temp 97.9°F | Ht 64.5 in | Wt 191.0 lb

## 2019-07-05 DIAGNOSIS — D649 Anemia, unspecified: Secondary | ICD-10-CM

## 2019-07-05 DIAGNOSIS — K5909 Other constipation: Secondary | ICD-10-CM | POA: Diagnosis not present

## 2019-07-05 LAB — VITAMIN B12: Vitamin B-12: 265 pg/mL (ref 211–911)

## 2019-07-05 LAB — IBC + FERRITIN
Ferritin: 35.4 ng/mL (ref 10.0–291.0)
Iron: 65 ug/dL (ref 42–145)
Saturation Ratios: 17.1 % — ABNORMAL LOW (ref 20.0–50.0)
Transferrin: 272 mg/dL (ref 212.0–360.0)

## 2019-07-05 LAB — FOLATE: Folate: 14.6 ng/mL (ref >5.9)

## 2019-07-05 NOTE — Progress Notes (Signed)
07/05/2019 Shannon Roberts [id number] May 31, 1951   HISTORY OF PRESENT ILLNESS: This is a pleasant 68 year old female who was previously a patient of Dr. Kelby Fam, but her care is being assumed by Dr. Carlean Purl at her request.  She is here today at the request of her PCP, Dr. Harrington Challenger from [facility], for evaluation regarding anemia.  She has a hemoglobin of 11.1 g as seen about a month ago.  MCV is normal.  LFTs normal.  She says that she was started on ferrous sulfate about 2 weeks ago and feels like she has more energy.  She reports seeing occasional blood described as bright red in color and small amounts with bowel movements over the past 2 years.  Says that she was diagnosed with irritable bowel in the past as she has always had issues with her bowels.  Tends to mostly have constipation with seldom diarrhea.  She started a probiotic and thinks that that helps, but does still struggle with constipation mostly.  She reports about 10 pound weight loss since January of this year.  She reports have a negative FIT test in July but I do not have that report.   Colonoscopy 06/2011 by Dr. Deatra Ina showed internal hemorrhoids only.   Past Medical History:  Diagnosis Date   Allergic rhinitis    Anemia    per hosp. - recently- pt. told that she is anemic   Anxiety    Colitis    COPD (chronic obstructive pulmonary disease) ([place])    no o2   Depression    Diverticulosis    Female bladder prolapse    Fibroids    GERD (gastroesophageal reflux disease)    Heart murmur    Hyperlipemia    Hypertension    IBS (irritable bowel syndrome)    Insomnia    Mitral valve prolapse    Peripheral vascular disease (HCC)    Sciatic nerve disease    Shortness of breath    Stroke [facility]) March 2015   Past Surgical History:  Procedure Laterality Date   CAROTID ENDARTERECTOMY Right January 17, 2014   CE   CHOLECYSTECTOMY     COLONOSCOPY     ENDARTERECTOMY Right 01/17/2014   Procedure:  RIGHT CAROTID ARTERY ENDARTERECTOMY WITH DACRON PATCH ANGIOPLASTY;  Surgeon: Rosetta Posner, MD;  Location: [facility];  Service: Vascular;  Laterality: Right;   UPPER GASTROINTESTINAL ENDOSCOPY      reports that she quit smoking about 5 years ago. Her smoking use included cigarettes. She started smoking about 52 years ago. She has a 70.50 pack-year smoking history. She has never used smokeless tobacco. She reports that she does not drink alcohol or use drugs. family history includes AAA (abdominal aortic aneurysm) in her mother; Breast cancer in her maternal aunt and mother; Colon cancer in her paternal aunt; Diabetes in her maternal aunt and paternal grandfather; Emphysema in her mother and paternal aunt; Heart disease in her paternal grandfather; Kidney disease in her maternal aunt; Lung cancer in her father; Ovarian cancer in her mother; Pancreatic cancer in her maternal grandmother. Allergies  Allergen Reactions   Carvedilol Palpitations   Dapsone Other (See Comments)    Reaction:  Flushing    Niacin And Related Other (See Comments)    Reaction:  Flushing    Septra [Sulfamethoxazole-Trimethoprim]     Nausea, vomiting, diarrhea   Wellbutrin [Bupropion]     Caused jittery    Amoxicillin Rash and Other (See Comments)    Has patient  had a PCN reaction causing immediate rash, facial/tongue/throat swelling, SOB or lightheadedness with hypotension: No Has patient had a PCN reaction causing severe rash involving mucus membranes or skin necrosis: No Has patient had a PCN reaction that required hospitalization No Has patient had a PCN reaction occurring within the last 10 years: No If all of the above answers are "NO", then may proceed with Cephalosporin use.      Outpatient Encounter Medications as of 07/05/2019  Medication Sig   ferrous sulfate 325 (65 FE) MG tablet Take 325 mg by mouth daily with breakfast.   polyethylene glycol (MIRALAX / GLYCOLAX) 17 g packet Take 17 g by mouth as  needed.   Probiotic Product (PROBIOTIC-10 PO) Take by mouth daily.   acetaminophen (TYLENOL) 500 MG tablet Take 1,000 mg by mouth every 6 (six) hours as needed for mild pain, moderate pain, fever or headache.    ALPRAZolam (XANAX) 0.25 MG tablet Take 1 tablet by mouth at bedtime as needed.   amLODipine (NORVASC) 10 MG tablet Take 5 mg by mouth daily.    ANORO ELLIPTA 62.5-25 MCG/INH AEPB INHALE 1 PUFF INTO THE LUNGS DAILY   atenolol (TENORMIN) 100 MG tablet Take 1 tablet (100 mg total) by mouth daily. (Patient taking differently: Take 50 mg by mouth daily. )   Cholecalciferol (VITAMIN D3) 5000 units CAPS Take 5,000 Units by mouth every evening.    clopidogrel (PLAVIX) 75 MG tablet Take 75 mg by mouth daily.    DULoxetine (CYMBALTA) 30 MG capsule Take 90 mg by mouth every evening.   fluticasone (FLONASE) 50 MCG/ACT nasal spray Place 2 sprays into both nostrils daily as needed for allergies or rhinitis.   Ipratropium-Albuterol (COMBIVENT RESPIMAT) 20-100 MCG/ACT AERS respimat Take 1 puff by mouth every 6 (six) hours as needed for wheezing or shortness of breath.    ipratropium-albuterol (DUONEB) 0.5-2.5 (3) MG/3ML SOLN Take 3 mLs by nebulization every 6 (six) hours as needed (for wheezing/shortness of breath).   isosorbide mononitrate (IMDUR) 30 MG 24 hr tablet Take 30 mg by mouth 2 (two) times daily.   lisinopril (PRINIVIL,ZESTRIL) 40 MG tablet Take 40 mg by mouth daily.   LORazepam (ATIVAN) 0.5 MG tablet Take 0.5 mg by mouth 2 (two) times daily as needed for anxiety.    pantoprazole (PROTONIX) 40 MG tablet Take 40 mg by mouth daily.    simvastatin (ZOCOR) 10 MG tablet Take 1 tablet (10 mg total) by mouth at bedtime.   traMADol (ULTRAM) 50 MG tablet Take 50 mg by mouth every 6 (six) hours as needed for moderate pain.    umeclidinium-vilanterol (ANORO ELLIPTA) 62.5-25 MCG/INH AEPB Inhale 1 puff into the lungs daily.   [DISCONTINUED] moxifloxacin (AVELOX) 400 MG tablet Take 1  tablet (400 mg total) by mouth daily.   [DISCONTINUED] ranitidine (ZANTAC) 150 MG tablet Take 1 tablet (150 mg total) by mouth 2 (two) times daily.   No facility-administered encounter medications on file as of 07/05/2019.      REVIEW OF SYSTEMS  : All other systems reviewed and negative except where noted in the History of Present Illness.   PHYSICAL EXAM: BP 116/70    Pulse 80    Temp 97.9 F (36.6 C)    Ht 5' 4.5" (1.638 m) Comment: measured without shoes   Wt 191 lb (86.6 kg)    SpO2 99%    BMI 32.28 kg/m  General: Well developed white female in no acute distress Head: Normocephalic and atraumatic Eyes:  Sclerae  anicteric, conjunctiva pink. Ears: Normal auditory acuity Lungs: Clear throughout to auscultation; no increased WOB. Heart: Regular rate and rhythm; no M/R/G. Abdomen: Soft, non-distended.  BS present.  Non-tender. Musculoskeletal: Symmetrical with no gross deformities  Skin: No lesions on visible extremities Extremities: No edema  Neurological: Alert oriented x 4, grossly non-focal Psychological:  Alert and cooperative. Normal mood and affect  ASSESSMENT AND PLAN: *Anemia:  Mild with Hgb of 11.1 grams.  Sees occasional blood in stools over the past couple of years in small amounts.  MCV is normal.  Will check iron studies, B12 and folate levels. *Constipation:  Thinks that probiotic helps some.  Will also start Miralax daily and increase to BID prn.  **We discussed colonoscopy as her last was in 2012 with only internal hemorrhoids.  She is asking to defer colonoscopy for now as her 38-month pregnant daughter would have to be her care partner.  She would like to wait and have it done later this year if possible.  I think that is okay for now.  We will continue to monitor and see her back in about 4 to 6 weeks.   CC:  Lawerance Cruel, MD

## 2019-07-05 NOTE — Patient Instructions (Addendum)
If you are age 69 or older, your body mass index should be between 23-30. Your Body mass index is 32.28 kg/m. If this is out of the aforementioned range listed, please consider follow up with your Primary Care Provider.  If you are age 57 or younger, your body mass index should be between 19-25. Your Body mass index is 32.28 kg/m. If this is out of the aformentioned range listed, please consider follow up with your Primary Care Provider.   Your provider has requested that you go to the basement level for lab work before leaving today. Press "B" on the elevator. The lab is located at the first door on the left as you exit the elevator.  Miralax daily, please increase to to twice a day as needed.  Please follow up in 4-6 weeks [phone]  It was a pleasure to see you today!

## 2019-07-09 ENCOUNTER — Encounter: Payer: Self-pay | Admitting: Gastroenterology

## 2019-07-09 DIAGNOSIS — D649 Anemia, unspecified: Secondary | ICD-10-CM | POA: Insufficient documentation

## 2019-07-09 DIAGNOSIS — K5909 Other constipation: Secondary | ICD-10-CM | POA: Insufficient documentation

## 2019-07-12 ENCOUNTER — Telehealth: Payer: Self-pay | Admitting: Gastroenterology

## 2019-07-12 NOTE — Telephone Encounter (Signed)
Shannon Roberts the pt is calling for lab results.  Please advise

## 2019-07-12 NOTE — Telephone Encounter (Signed)
Dr Reche Dixon is off all week can you review the labs from 914

## 2019-07-13 NOTE — Telephone Encounter (Signed)
I sent her a My Chart message about results - probably moixed Fe Defic and chronic disease anemia  She is going to need EGD/colonoscopy for Fe defic anemia but wants to wait until her daughter delivers   We will also need to hold Plavix in her case  If you will just f/u and make sure she got the message and we can get an idea of when we can schedule and who to ask to hold Plavix  Ideally she would not wait and find another care partner but ultimately her decision

## 2019-07-16 ENCOUNTER — Telehealth: Payer: Self-pay | Admitting: Internal Medicine

## 2019-07-16 NOTE — Telephone Encounter (Signed)
Pt stated that Dr. Carlean Purl has contacted her, so she does not need a call back.

## 2019-07-16 NOTE — Telephone Encounter (Signed)
Noted  

## 2019-07-20 ENCOUNTER — Encounter: Payer: Self-pay | Admitting: Internal Medicine

## 2019-07-20 ENCOUNTER — Telehealth: Payer: Self-pay

## 2019-07-20 ENCOUNTER — Other Ambulatory Visit: Payer: Self-pay | Admitting: Pulmonary Disease

## 2019-07-20 NOTE — Telephone Encounter (Signed)
Patient scheduled for Endoscopy and colonoscopy with Dr. Carlean Purl 08/11/19@10am . Please advise if pt may hold Plavix for 5 days prior to the procedures.

## 2019-07-20 NOTE — Telephone Encounter (Signed)
I called her and explained why we should go ahead with EGD/colonoscopy  Explained need to hold Plavix x 5 days before and that there would be a very rare but real risk of stroke off that   Please do the following  1) Book EGD/colonoscopy 10/21 10 and 1030 slots  2) Get clearance to hold the Plavix x 5 days from PCP  3) Previsit appt  4) Cancel 10/14 appt Alonza Bogus   Dx is iron def anemia

## 2019-07-20 NOTE — Telephone Encounter (Signed)
Pt aware, appt cancelled with Janett Billow. Pt scheduled for previsit 10/12@10am  and ECL in the [facility].08/11/19@10am . Discussed with pt that clearance to hold plavix will be requested from Dr. Harrington Challenger. Pt states this should be sent to Dr. Donnetta Hutching. Will send request.

## 2019-07-26 ENCOUNTER — Telehealth: Payer: Self-pay | Admitting: Internal Medicine

## 2019-07-27 NOTE — Telephone Encounter (Signed)
See the 9/29 phone note  We need to send labs and that note to Dr. Harrington Challenger - patient told Vaughan Basta to request Plavix clearance from Dr. Donnetta Hutching so then we did not send anything to Dr. Harrington Challenger it seems  Explain our mistake and tell her we will get the results and plans to dr. Harrington Challenger also

## 2019-07-27 NOTE — Telephone Encounter (Signed)
Dr. Carlean Purl is there something I need to send to Dr. Harrington Challenger?

## 2019-07-28 NOTE — Telephone Encounter (Signed)
Called Dr. Luther Parody office to check on holding plavix prior to procedure. Per [facility] LPN Dr. Donnetta Hutching ok holding plavix for 5 days prior to procedure.

## 2019-07-28 NOTE — Telephone Encounter (Signed)
Notes and labs sent to Dr. Harrington Challenger.  Left message for patient to call back

## 2019-07-28 NOTE — Telephone Encounter (Signed)
Patient notified that the results have been sent to Dr. Harrington Challenger.

## 2019-07-29 DIAGNOSIS — Z23 Encounter for immunization: Secondary | ICD-10-CM | POA: Diagnosis not present

## 2019-08-03 ENCOUNTER — Encounter: Payer: Self-pay | Admitting: Internal Medicine

## 2019-08-03 ENCOUNTER — Telehealth: Payer: Self-pay | Admitting: Internal Medicine

## 2019-08-03 ENCOUNTER — Other Ambulatory Visit: Payer: Self-pay

## 2019-08-03 ENCOUNTER — Ambulatory Visit (AMBULATORY_SURGERY_CENTER): Payer: Medicare Other | Admitting: *Deleted

## 2019-08-03 VITALS — Temp 96.8°F | Ht 64.0 in | Wt 191.4 lb

## 2019-08-03 DIAGNOSIS — R8290 Unspecified abnormal findings in urine: Secondary | ICD-10-CM | POA: Insufficient documentation

## 2019-08-03 DIAGNOSIS — N8110 Cystocele, unspecified: Secondary | ICD-10-CM | POA: Insufficient documentation

## 2019-08-03 DIAGNOSIS — IMO0002 Reserved for concepts with insufficient information to code with codable children: Secondary | ICD-10-CM | POA: Insufficient documentation

## 2019-08-03 DIAGNOSIS — N803 Endometriosis of pelvic peritoneum: Secondary | ICD-10-CM | POA: Insufficient documentation

## 2019-08-03 DIAGNOSIS — D509 Iron deficiency anemia, unspecified: Secondary | ICD-10-CM

## 2019-08-03 DIAGNOSIS — R7303 Prediabetes: Secondary | ICD-10-CM

## 2019-08-03 DIAGNOSIS — R3989 Other symptoms and signs involving the genitourinary system: Secondary | ICD-10-CM | POA: Insufficient documentation

## 2019-08-03 DIAGNOSIS — R1900 Intra-abdominal and pelvic swelling, mass and lump, unspecified site: Secondary | ICD-10-CM | POA: Insufficient documentation

## 2019-08-03 DIAGNOSIS — N83209 Unspecified ovarian cyst, unspecified side: Secondary | ICD-10-CM | POA: Insufficient documentation

## 2019-08-03 DIAGNOSIS — N183 Chronic kidney disease, stage 3 unspecified: Secondary | ICD-10-CM | POA: Insufficient documentation

## 2019-08-03 HISTORY — DX: Prediabetes: R73.03

## 2019-08-03 NOTE — Telephone Encounter (Signed)
Pt had her PV today, she wants to make sure that it is stated on her med hx that she has pre-diabetes that is controlled by her diet. Also she was reading her med hx on "mychart" and noticed that under her health conditions it is indicated that she has anemia due to chronic kidney disease. Pt states that she has never been told that she has kidney disease so is not sure if this information is accurate. Pls call her.

## 2019-08-03 NOTE — Progress Notes (Signed)

## 2019-08-03 NOTE — Telephone Encounter (Signed)
Pt added CKD III to her medical history and pre diabetes diet controlled - we discussed the CKD III at her admission 01/2017 - we went over her BUN and Creatinine which are barely abnormal but I encouraged her to discuss this with Dr Harrington Challenger

## 2019-08-04 ENCOUNTER — Ambulatory Visit: Payer: Medicare Other | Admitting: Gastroenterology

## 2019-08-10 ENCOUNTER — Telehealth: Payer: Self-pay

## 2019-08-10 ENCOUNTER — Telehealth: Payer: Self-pay | Admitting: Gastroenterology

## 2019-08-10 MED ORDER — ONDANSETRON 4 MG PO TBDP: 4.0000 mg | ORAL_TABLET | Freq: Three times a day (TID) | ORAL | 0 refills | Status: AC | PRN

## 2019-08-10 NOTE — Telephone Encounter (Signed)
Patient has EGD/colonoscopy scheduled for tomorrow with Dr. Carlean Purl. Patient called having significant abdominal upset and distress/discomfort after completing half of her preparation. She has been able to do the first round of Dulcolax and MiraLAX. Had a little bit of spit up and then vomited 1/4 cup of MiraLAX. She is starting to have bowel movements. Does not usually have nausea and has not taken antinausea medicine in quite a while. I asked patient to take next round of Dulcolax and MiraLAX earlier than the time.  Of 5 AM and start closer to around 3 to 3:30 AM and drink the MiraLAX more slowly. I will send her antiemetics (Zofran 4 mg) which she can use as needed if needed. This will be sent to the [facility] 24-hour pharmacy on: [address]. Patient appreciative for call back and believes she will be able to get through this and be ready for her procedure tomorrow. I will send this message to Dr. Carlean Purl and the [facility] RN pool.  Justice Britain, MD [facility] # [phone]

## 2019-08-10 NOTE — Telephone Encounter (Signed)
Covid-19 screening questions   Do you now or have you had a fever in the last 14 days?  Do you have any respiratory symptoms of shortness of breath or cough now or in the last 14 days?  Do you have any family members or close contacts with diagnosed or suspected Covid-19 in the past 14 days?  Have you been tested for Covid-19 and found to be positive?       

## 2019-08-10 NOTE — Telephone Encounter (Signed)
Pt responded "no" to all screening questions °

## 2019-08-11 ENCOUNTER — Encounter: Payer: Self-pay | Admitting: Internal Medicine

## 2019-08-11 ENCOUNTER — Ambulatory Visit (AMBULATORY_SURGERY_CENTER): Payer: Medicare Other | Admitting: Internal Medicine

## 2019-08-11 ENCOUNTER — Other Ambulatory Visit: Payer: Self-pay

## 2019-08-11 ENCOUNTER — Other Ambulatory Visit: Payer: Self-pay | Admitting: Internal Medicine

## 2019-08-11 ENCOUNTER — Telehealth: Payer: Self-pay

## 2019-08-11 VITALS — BP 144/86 | HR 96 | Temp 96.8°F | Resp 49 | Ht 64.0 in | Wt 191.4 lb

## 2019-08-11 DIAGNOSIS — D124 Benign neoplasm of descending colon: Secondary | ICD-10-CM | POA: Diagnosis not present

## 2019-08-11 DIAGNOSIS — D123 Benign neoplasm of transverse colon: Secondary | ICD-10-CM | POA: Diagnosis not present

## 2019-08-11 DIAGNOSIS — D509 Iron deficiency anemia, unspecified: Secondary | ICD-10-CM

## 2019-08-11 MED ORDER — SODIUM CHLORIDE 0.9 % IV SOLN: 500.0000 mL | Freq: Once | INTRAVENOUS | Status: DC

## 2019-08-11 NOTE — Op Note (Signed)
[name] Procedure Date: 08/11/2019 9:57 AM MRN: [id number] Endoscopist: Gatha Mayer , MD Age: 68 Referring MD:  Date of Birth: Sep 01, 1951 Gender: Female Account #: [id number] Procedure:                Colonoscopy Indications:              Iron deficiency anemia Medicines:                Propofol per Anesthesia, Monitored Anesthesia Care Procedure:                Pre-Anesthesia Assessment:                           - Prior to the procedure, a History and Physical                            was performed, and patient medications and                            allergies were reviewed. The patient's tolerance of                            previous anesthesia was also reviewed. The risks                            and benefits of the procedure and the sedation                            options and risks were discussed with the patient.                            All questions were answered, and informed consent                            was obtained. Prior Anticoagulants: The patient                            last took Plavix (clopidogrel) 5 days prior to the                            procedure. ASA Grade Assessment: III - A patient                            with severe systemic disease. After reviewing the                            risks and benefits, the patient was deemed in                            satisfactory condition to undergo the procedure.                           After obtaining informed consent, the colonoscope  was passed under direct vision. Throughout the                            procedure, the patient's blood pressure, pulse, and                            oxygen saturations were monitored continuously. The                            Colonoscope was introduced through the anus and                            advanced to the the cecum, identified by                            appendiceal  orifice and ileocecal valve. The                            colonoscopy was somewhat difficult due to                            significant looping. Successful completion of the                            procedure was aided by applying abdominal pressure.                            The patient tolerated the procedure well. The                            quality of the bowel preparation was excellent. The                            bowel preparation used was Miralax via split dose                            instruction. The ileocecal valve, appendiceal                            orifice, and rectum were photographed. Scope In: 10:13:09 AM Scope Out: 10:34:26 AM Scope Withdrawal Time: 0 hours 14 minutes 55 seconds  Total Procedure Duration: 0 hours 21 minutes 17 seconds  Findings:                 The perianal and digital rectal examinations were                            normal.                           A diminutive polyp was found in the proximal                            transverse colon. The polyp was sessile. The polyp  was removed with a cold biopsy forceps. Resection                            and retrieval were complete. Verification of                            patient identification for the specimen was done.                            Estimated blood loss was minimal.                           A 7 mm polyp was found in the proximal descending                            colon. The polyp was sessile. The polyp was removed                            with a cold snare. Resection and retrieval were                            complete. Verification of patient identification                            for the specimen was done. Estimated blood loss was                            minimal.                           External and internal hemorrhoids were found.                           The exam was otherwise without abnormality on                             direct and retroflexion views. Complications:            No immediate complications. Estimated Blood Loss:     Estimated blood loss was minimal. Impression:               - One diminutive polyp in the proximal transverse                            colon, removed with a cold biopsy forceps. Resected                            and retrieved.                           - One 7 mm polyp in the proximal descending colon,                            removed with a cold snare. Resected and retrieved.                           -  External and internal hemorrhoids.                           - The examination was otherwise normal on direct                            and retroflexion views. Recommendation:           - Patient has a contact number available for                            emergencies. The signs and symptoms of potential                            delayed complications were discussed with the                            patient. Return to normal activities tomorrow.                            Written discharge instructions were provided to the                            patient.                           - Continue present medications.                           - Resume Plavix (clopidogrel) at prior dose                            tomorrow.                           - Repeat colonoscopy is recommended. The                            colonoscopy date will be determined after pathology                            results from today's exam become available for                            review. Gatha Mayer, MD 08/11/2019 10:45:25 AM This report has been signed electronically.

## 2019-08-11 NOTE — Progress Notes (Signed)
To PACU, Treating B/P. Report to Rn. TB

## 2019-08-11 NOTE — Telephone Encounter (Signed)
Patient called complaining that she hasn't passed any gas this afternoon and her stomach is cramping.  She was passing gas while in recovery and patient stated she also was passing on her way home.  She hasn't had anything to eat yet.  BP was 164/95 and complains of feeling jittery.    I have advised patient to take GasX and try to walk around her house to help move some more air.  Also reminded her that she was given BP meds to increase her BP which can cause the jittery feeling and higher BP. I will forward message to Dr. Carlean Purl making him aware. Patient is aware to call the emergency line if her symptoms worsen tonight.

## 2019-08-11 NOTE — Patient Instructions (Addendum)
I found and removed 2 colon polyps that look benign.  I also saw hemorrhoids.  Otherwise ok - no signs of cancer or anything bad.  Go back on the ferrous sulfate and follow-up with Dr. Harrington Challenger.  I will let you know pathology results and when to have another routine colonoscopy by mail and/or My Chart.  I appreciate the opportunity to care for you. Gatha Mayer, MD, [facility]   Resume Plavix at prior dose tomorrow 08/12/2019.  Handout on polyps given  YOU HAD AN ENDOSCOPIC PROCEDURE TODAY AT [facility]:   Refer to the procedure report that was given to you for any specific questions about what was found during the examination.  If the procedure report does not answer your questions, please call your gastroenterologist to clarify.  If you requested that your care partner not be given the details of your procedure findings, then the procedure report has been included in a sealed envelope for you to review at your convenience later.  YOU SHOULD EXPECT: Some feelings of bloating in the abdomen. Passage of more gas than usual.  Walking can help get rid of the air that was put into your GI tract during the procedure and reduce the bloating. If you had a lower endoscopy (such as a colonoscopy or flexible sigmoidoscopy) you may notice spotting of blood in your stool or on the toilet paper. If you underwent a bowel prep for your procedure, you may not have a normal bowel movement for a few days.  Please Note:  You might notice some irritation and congestion in your nose or some drainage.  This is from the oxygen used during your procedure.  There is no need for concern and it should clear up in a day or so.  SYMPTOMS TO REPORT IMMEDIATELY:   Following lower endoscopy (colonoscopy or flexible sigmoidoscopy):  Excessive amounts of blood in the stool  Significant tenderness or worsening of abdominal pains  Swelling of the abdomen that is new, acute  Fever of 100F or  higher   Following upper endoscopy (EGD)  Vomiting of blood or coffee ground material  New chest pain or pain under the shoulder blades  Painful or persistently difficult swallowing  New shortness of breath  Fever of 100F or higher  Black, tarry-looking stools  For urgent or emergent issues, a gastroenterologist can be reached at any hour by calling [phone].   DIET:  We do recommend a small meal at first, but then you may proceed to your regular diet.  Drink plenty of fluids but you should avoid alcoholic beverages for 24 hours.  ACTIVITY:  You should plan to take it easy for the rest of today and you should NOT DRIVE or use heavy machinery until tomorrow (because of the sedation medicines used during the test).    FOLLOW UP: Our staff will call the number listed on your records 48-72 hours following your procedure to check on you and address any questions or concerns that you may have regarding the information given to you following your procedure. If we do not reach you, we will leave a message.  We will attempt to reach you two times.  During this call, we will ask if you have developed any symptoms of COVID 19. If you develop any symptoms (ie: fever, flu-like symptoms, shortness of breath, cough etc.) before then, please call [phone].  If you test positive for Covid 19 in the 2 weeks post procedure, please call and report  this information to [place].    If any biopsies were taken you will be contacted by phone or by letter within the next 1-3 weeks.  Please call us at [phone] if you have not heard about the biopsies in 3 weeks.    SIGNATURES/CONFIDENTIALITY: You and/or your care partner have signed paperwork which will be entered into your electronic medical record.  These signatures attest to the fact that that the information above on your After Visit Summary has been reviewed and is understood.  Full responsibility of the confidentiality of this discharge information  lies with you and/or your care-partner.

## 2019-08-11 NOTE — Progress Notes (Signed)
Pt's states no medical or surgical changes since previsit or office visit. 

## 2019-08-11 NOTE — Op Note (Addendum)
[facility] Patient Name: Shannon Roberts Procedure Date: 08/11/2019 9:57 AM MRN: [id number] Endoscopist: Gatha Mayer , MD Age: 68 Referring MD:  Date of Birth: [birth year] Gender: Female Account #: [id number] Procedure:                Upper GI endoscopy Indications:              Iron deficiency anemia Medicines:                Propofol per Anesthesia, Monitored Anesthesia Care Procedure:                Pre-Anesthesia Assessment:                           - Prior to the procedure, a History and Physical                            was performed, and patient medications and                            allergies were reviewed. The patient's tolerance of                            previous anesthesia was also reviewed. The risks                            and benefits of the procedure and the sedation                            options and risks were discussed with the patient.                            All questions were answered, and informed consent                            was obtained. Prior Anticoagulants: The patient                            last took Plavix (clopidogrel) 5 days prior to the                            procedure. ASA Grade Assessment: III - A patient                            with severe systemic disease. After reviewing the                            risks and benefits, the patient was deemed in                            satisfactory condition to undergo the procedure.                           After obtaining informed consent, the endoscope was  passed under direct vision. Throughout the                            procedure, the patient's blood pressure, pulse, and                            oxygen saturations were monitored continuously. The                            Endoscope was introduced through the mouth, and                            advanced to the second part of duodenum. The upper   GI endoscopy was accomplished without difficulty.                            The patient tolerated the procedure well. Scope In: Scope Out: Findings:                 The esophagus was normal.                           The stomach was normal.                           The examined duodenum was normal.                           The cardia and gastric fundus were normal on                            retroflexion. Complications:            No immediate complications. Estimated Blood Loss:     Estimated blood loss: none. Impression:               - Normal esophagus.                           - Normal stomach.                           - Normal examined duodenum.                           - No specimens collected. Recommendation:           - Patient has a contact number available for                            emergencies. The signs and symptoms of potential                            delayed complications were discussed with the                            patient. Return to normal activities tomorrow.  Written discharge instructions were provided to the                            patient.                           - Continue present medications.                           - See the other procedure note for documentation of                            additional recommendations.                           - Resume Plavix (clopidogrel) at prior dose                            tomorrow. [Management]. Gatha Mayer, MD 08/11/2019 10:42:19 AM This report has been signed electronically.

## 2019-08-11 NOTE — Progress Notes (Signed)
Called to room to assist during endoscopic procedure.  Patient ID and intended procedure confirmed with present staff. Received instructions for my participation in the procedure from the performing physician.  

## 2019-08-11 NOTE — Telephone Encounter (Signed)
Agree with recommendations.  

## 2019-08-13 ENCOUNTER — Telehealth: Payer: Self-pay | Admitting: *Deleted

## 2019-08-13 NOTE — Telephone Encounter (Signed)
Left message on f/u call 

## 2019-08-13 NOTE — Telephone Encounter (Signed)
  Follow up Call-  Call back number 08/11/2019  Post procedure Call Back phone  # [phone]  Permission to leave phone message Yes  Some recent data might be hidden     Patient questions:  Do you have a fever, pain , or abdominal swelling? No. Pain Score  0 *  Have you tolerated food without any problems? Yes.    Have you been able to return to your normal activities? Yes.    Do you have any questions about your discharge instructions: Diet   No. Medications  No. Follow up visit  No.  Do you have questions or concerns about your Care? No.  Actions: * If pain score is 4 or above: No action needed, pain <4.  Pt had severe cramping on Wednesday but has "gotten better every day."    1. Have you developed a fever since your procedure? no  2.   Have you had an respiratory symptoms (SOB or cough) since your procedure? no  3.   Have you tested positive for COVID 19 since your procedure no  4.   Have you had any family members/close contacts diagnosed with the COVID 19 since your procedure?  no   If yes to any of these questions please route to Joylene John, RN and Alphonsa Gin, Therapist, sports.

## 2019-08-17 ENCOUNTER — Other Ambulatory Visit: Payer: Self-pay | Admitting: Pulmonary Disease

## 2019-08-17 ENCOUNTER — Telehealth: Payer: Self-pay | Admitting: Pulmonary Disease

## 2019-08-17 ENCOUNTER — Telehealth: Payer: Self-pay | Admitting: Primary Care

## 2019-08-17 ENCOUNTER — Telehealth: Payer: Self-pay | Admitting: Adult Health

## 2019-08-17 ENCOUNTER — Encounter: Payer: Self-pay | Admitting: Primary Care

## 2019-08-17 ENCOUNTER — Other Ambulatory Visit: Payer: Self-pay

## 2019-08-17 ENCOUNTER — Ambulatory Visit (INDEPENDENT_AMBULATORY_CARE_PROVIDER_SITE_OTHER): Payer: Medicare Other

## 2019-08-17 ENCOUNTER — Ambulatory Visit (INDEPENDENT_AMBULATORY_CARE_PROVIDER_SITE_OTHER): Payer: Medicare Other | Admitting: Primary Care

## 2019-08-17 ENCOUNTER — Telehealth: Payer: Self-pay | Admitting: Internal Medicine

## 2019-08-17 DIAGNOSIS — R509 Fever, unspecified: Secondary | ICD-10-CM | POA: Diagnosis not present

## 2019-08-17 DIAGNOSIS — R0602 Shortness of breath: Secondary | ICD-10-CM

## 2019-08-17 DIAGNOSIS — Z20822 Contact with and (suspected) exposure to covid-19: Secondary | ICD-10-CM

## 2019-08-17 DIAGNOSIS — Z20828 Contact with and (suspected) exposure to other viral communicable diseases: Secondary | ICD-10-CM | POA: Diagnosis not present

## 2019-08-17 MED ORDER — PREDNISONE 10 MG PO TABS: ORAL_TABLET | ORAL | 0 refills | Status: DC

## 2019-08-17 NOTE — Telephone Encounter (Signed)
Spoke with pt, she states she had a low grade fever on Sunday. She had an endoscopy last Wednesday and it started after this. I asked her did she tell her doctor who did the endoscopy and she said she hasn't told him yet. She is on medication for a urinary tract infection called Nitrofurantoin. She is SOB when she exerts and some cough with minimal phlegm but clear in color. She states she took Tylenol and reports that the combivent and duonebs do help with the SOB. She would like something called in for her symptoms to prevent a COPD exacerbation. I reminded her that we haven't seen her since 2018 and that she needed to make an appt for a follow up. Please advise.   Covid screening negative other than SOB and cough. She hasnt left her house much but to go to doctor appts.

## 2019-08-17 NOTE — Telephone Encounter (Signed)
Patient called inquiring about need for antibiotics.  She had called earlier due to mild shortness of breath and fever of 100.3 with cough not more productive than usual.. She was given Prednisone 20 and CXR and COVID swab was requested.  She is completing nitrofurantoin for UTI. I reviewed her CXR and showed mild bibasilar opacities.  I informed patient of CXR findings and advised her to wait on COVID swab. Her fever has not recurred. She is to call the clinic in the morning for update and relay COVID swab result. Will hold off on antibiotics at this time.

## 2019-08-17 NOTE — Telephone Encounter (Signed)
Spoke with patient. She stated that she had already her question answered about the COVID testing. She wanted to know if it was ok for her take the 2 prednisone tablets together at the same time. Advised her that it was best for her to take the tablets at the same time. She verbalized understanding. Nothing further needed at time of call.

## 2019-08-17 NOTE — Telephone Encounter (Signed)
I called and left message that I was trying to reach her  I will send a My Chart message answer also

## 2019-08-17 NOTE — Progress Notes (Signed)
Virtual Visit via Telephone Note  I connected with Shannon Roberts on 08/17/19 at  2:00 PM EDT by telephone and verified that I am speaking with the correct person using two identifiers.  Location: Patient: Home Provider: Office   I discussed the limitations, risks, security and privacy concerns of performing an evaluation and management service by telephone and the availability of in person appointments. I also discussed with the patient that there may be a patient responsible charge related to this service. The patient expressed understanding and agreed to proceed.   History of Present Illness: 68 year old female, former smoker. PMH significant for severe COPD. Patient of Dr. Lake Bells, last seen by pulmonary NP on 09/09/18 for sinusitis/COPD exacerbation and treated with depo-medrol and zpack. Maintained on Anoro and prn combivent/duonebs.   08/17/2019 Patient contacted today for televisit. Reports increased shortness of breath, headache, muscle aches and low grade fever. Symptoms started 4 days ago. She had a temperature of 100.3 over the weekend that broke with tylenol. Continues Anoro 1 puff daily, she used her combivent rescue inhaler once today with improvement. She has a slight cough with clear mucus. She took mucinex for a few days with improvement. She had an EGD/colonoscopy on 10/21 for history of iron deficiency anemia, they found two polyps and internal/external hemorrhoids. She is currently taking Macrobid for a suspected UTI. Her urinary symptoms have improved. Denies abdominal pain, N/V/D, blood in stool.   Observations/Objective:  - No shortness of breath, wheezing or cough noted during phone conversation  Assessment and Plan:  COPD exacerbation - Increased sob with flu like symptoms x 4 days  - RX prednisone 20mg  x 5 days d/t hx pre-diabetes  - Continue Anoro 1 puff daily; Albuterol/duoneb q 6 hours prn sob/wheezing  - Checking CXR r/o pneumonia - Needs Covid testing    Follow Up Instructions:   - FU tomorrow by phone after testing completed   I discussed the assessment and treatment plan with the patient. The patient was provided an opportunity to ask questions and all were answered. The patient agreed with the plan and demonstrated an understanding of the instructions.   The patient was advised to call back or seek an in-person evaluation if the symptoms worsen or if the condition fails to improve as anticipated.  I provided 22 minutes of non-face-to-face time during this encounter.   Martyn Ehrich, NP

## 2019-08-17 NOTE — Telephone Encounter (Signed)
Multiple things are needed for the patient.  1.  Patient needs to contact gastroenterologist who did their endoscopy regarding acute symptoms 2.  Patient is scheduled for an acute tele or video visit with an APP or another provider for her acute symptoms we cannot just treat her empirically telephonically especially given the fact that she has not been seen in 2018.  I believe EW, TP, and myself all have openings. 3.  Patient also needs to be scheduled for a 30-minute office visit in 4 to 6 weeks with a new pulmonologist with our practice as Dr. Lake Bells is no longer seeing patients in our clinic.  He is remaining with our team on the inpatient side as well as working at [employer].  Please coordinate this triage  Wyn Quaker, FNP

## 2019-08-17 NOTE — Telephone Encounter (Signed)
Pt had EGD/col 08/11/19.  She reported that 08/15/19 she developed a fever and shortness of breath.  She was advised by pulmonologist to report to [facility].  She has not had COVID-19 test.

## 2019-08-17 NOTE — Telephone Encounter (Signed)
She has been sent for COVID screen and they are sending her for a CXR.  She was asked to call and let you know. This is a Pharmacist, hospital.  Patient is asking if she had a hypotensive event during her procedure that she required treatment for?

## 2019-08-17 NOTE — Patient Instructions (Addendum)
Treating you for suspected COPD exacerbation, we need to rule out pneumonia or COVID as possible causes of your symptoms  RX: Prednisone 20mg  x 5 days  Recommendations: Continue Anoro 1 puff daily Use Combivent/Duoneb every 6 hours for shortness of breath/wheezing  Maintain quarantine until testing comes back  Follow-up: If symptoms do not improve or worsen in any way Need to set up annual visit with new [facility] pulmonary provider in 1-3 months (previous [facility] patient)

## 2019-08-17 NOTE — Telephone Encounter (Signed)
Called and spoke to patient. Relayed message/reccomendations.  Scheduled patient for televisit since patient stated she can't do a mychart video visit. Patient reported she will notify GI of symptoms as well. Patient stated during telephone visit with she will ask about which pulmonologist is recommended and then make appointment to establish with them.  Nothing further needed at this time.

## 2019-08-17 NOTE — Telephone Encounter (Signed)
Never mind pt just wanted to make sure you knew her fever was Sunday not monday

## 2019-08-17 NOTE — Telephone Encounter (Signed)
Pt needs to speak with the nurse again she has something else she would like to tell you

## 2019-08-18 ENCOUNTER — Telehealth: Payer: Self-pay | Admitting: Primary Care

## 2019-08-18 MED ORDER — DOXYCYCLINE HYCLATE 100 MG PO TABS: 100.0000 mg | ORAL_TABLET | Freq: Two times a day (BID) | ORAL | 0 refills | Status: DC

## 2019-08-18 NOTE — Progress Notes (Signed)
There is a slight haziness over lung base which may be due to edema or infection. Will send in antibiotic, does she take a diuretic? Any weight gain or leg swelling?

## 2019-08-18 NOTE — Progress Notes (Signed)
Reviewed, agree 

## 2019-08-18 NOTE — Telephone Encounter (Signed)
Spoke with patient. She stated that she took the prednisone yesterday and left some warmness in her face afterwards. Explained to patient that prednisone will sometimes cause people to get hot since it is a steroid and revs up the body. She stated that she took her dose for today and did not feel this sensation and her breathing has improved. Denied any sensation of throat closing up, increasing SOB or hives.   While on the phone, I did go over the CXR results with her. She stated that she is not currently on a diuretic. Instead of gaining weight, she has lost weight over the last year. She has also not noticed any swelling around her legs or ankles.   Will route to [place].

## 2019-08-18 NOTE — Telephone Encounter (Signed)
Spoke with patient. She is aware that doxy has been called in for her. She verbalized understanding about instructions. Nothing further needed at time of call.

## 2019-08-18 NOTE — Telephone Encounter (Signed)
Ok, I sent in doxycycline. Thanks

## 2019-08-19 ENCOUNTER — Telehealth: Payer: Self-pay | Admitting: Primary Care

## 2019-08-19 LAB — NOVEL CORONAVIRUS, NAA: SARS-CoV-2, NAA: NOT DETECTED

## 2019-08-19 NOTE — Telephone Encounter (Signed)
She can take Mucinex double strength (1200mg ) twice daily if needed for congestion. Otherwise if she is feeling fine there is no needed to worry. Some days she may get up some mucus and other days she may not.

## 2019-08-19 NOTE — Telephone Encounter (Signed)
Patient states last evening 08/18/19 she couldn't cough up her mucus. She took a mucinex only 1. She didn't know if she should do this or if it was okay. She states she felt different when she took it but this morning no cough and mucus came up and she feels fine.   I let patient know I would inform BW as FYI and if she didn't want patient to take mucinex I would call patient back, but to take it if needed.  BW FYI

## 2019-08-19 NOTE — Progress Notes (Signed)
Covid test negative

## 2019-08-19 NOTE — Telephone Encounter (Signed)
Spoke with patient. Let her know BW recommendations. Patient voiced understanding Nothing further needed at this time

## 2019-08-19 NOTE — Progress Notes (Signed)
Pt was already aware of results. She has already picked up abx Pt did not have any swelling or wt gain.

## 2019-08-20 ENCOUNTER — Telehealth: Payer: Self-pay | Admitting: Primary Care

## 2019-08-20 NOTE — Telephone Encounter (Signed)
Call returned to patient, confirmed DOB, she states this morning she had an episode of SOB. She reports she was able to breathe through it. She reports should she be concerned about coughing up mucous. I made her aware the documentation states she was started on an antibiotic 2 days ago and was told to take mucinex. She confirms she has been taking it. I made her aware that is what mucinex does it helps rid the body of excess mucous. Patient was relieved. She reports she is a "nervous nelly" and wanted to be sure she had not done anything wrong. I assured her she was fine. She also states her mucous has been changing colors. I made here aware she needs to completed her antibiotic and if she is still having discolored mucous we would re-group then. Voiced understanding.   Nothing further needed at this time.

## 2019-08-25 ENCOUNTER — Telehealth: Payer: Self-pay | Admitting: Primary Care

## 2019-08-25 NOTE — Telephone Encounter (Signed)
LMTCB

## 2019-08-25 NOTE — Telephone Encounter (Signed)
Spoke with the pt  She states that she finished her doxycycline yesterday and overall feeling improved  Her breathing is better, but she is still coughing up some green sputum occ  She is asking if she needs any additional abx  She denies any fever, chills, SOB, body aches  Please advise and forward msg back to triage, thanks

## 2019-08-25 NOTE — Telephone Encounter (Signed)
Called spoke with patient, discussed Beth's recommendations.  Patient grateful and voiced her understanding and will call the office if her symptoms do not improve or they worsen.  Nothing further needed; will sign off.

## 2019-08-25 NOTE — Telephone Encounter (Signed)
I would recommend waiting to see if it clears. It can take a couple weeks for the lungs to clear an infection. If color does not clear in another 5-7 days let us know. Continue mucinex for another week.

## 2019-08-26 ENCOUNTER — Encounter: Payer: Self-pay | Admitting: Internal Medicine

## 2019-08-26 DIAGNOSIS — Z860101 Personal history of adenomatous and serrated colon polyps: Secondary | ICD-10-CM

## 2019-08-26 DIAGNOSIS — Z86010 Personal history of colonic polyps: Secondary | ICD-10-CM

## 2019-08-26 HISTORY — DX: Personal history of colonic polyps: Z86.010

## 2019-08-26 HISTORY — DX: Personal history of adenomatous and serrated colon polyps: Z86.0101

## 2019-09-20 ENCOUNTER — Other Ambulatory Visit: Payer: Self-pay | Admitting: Primary Care

## 2019-09-22 ENCOUNTER — Other Ambulatory Visit: Payer: Self-pay | Admitting: Family Medicine

## 2019-09-22 ENCOUNTER — Telehealth: Payer: Self-pay | Admitting: Primary Care

## 2019-09-22 DIAGNOSIS — Z1231 Encounter for screening mammogram for malignant neoplasm of breast: Secondary | ICD-10-CM

## 2019-09-22 DIAGNOSIS — M545 Low back pain: Secondary | ICD-10-CM | POA: Diagnosis not present

## 2019-09-22 MED ORDER — ANORO ELLIPTA 62.5-25 MCG/INH IN AEPB: 1.0000 | INHALATION_SPRAY | Freq: Every day | RESPIRATORY_TRACT | 5 refills | Status: DC

## 2019-09-22 NOTE — Telephone Encounter (Signed)
Called and spoke with pt. Pt was needing a refill of her anoro to be sent to pharmacy. Stated to pt that I would send a refill to pharmacy for her and also stated that we needed to schedule an appt to have her established with a new pulmonologist. Pt verbalized understanding. appt scheduled for pt with Dr. Carlis Abbott 12/30 at 4pm. Rx sent in for pt. Nothing further needed.

## 2019-10-19 ENCOUNTER — Telehealth: Payer: Self-pay | Admitting: Primary Care

## 2019-10-19 MED ORDER — ANORO ELLIPTA 62.5-25 MCG/INH IN AEPB: INHALATION_SPRAY | RESPIRATORY_TRACT | 1 refills | Status: DC

## 2019-10-19 NOTE — Telephone Encounter (Signed)
Spoke with the pt  Advised refill on the Anoro was sent to [facility]  Nothing further needed

## 2019-10-20 ENCOUNTER — Other Ambulatory Visit: Payer: Self-pay

## 2019-10-20 ENCOUNTER — Ambulatory Visit (INDEPENDENT_AMBULATORY_CARE_PROVIDER_SITE_OTHER): Payer: Medicare Other | Admitting: Critical Care Medicine

## 2019-10-20 DIAGNOSIS — J449 Chronic obstructive pulmonary disease, unspecified: Secondary | ICD-10-CM

## 2019-10-20 MED ORDER — COMBIVENT RESPIMAT 20-100 MCG/ACT IN AERS: 1.0000 | INHALATION_SPRAY | RESPIRATORY_TRACT | 11 refills | Status: DC | PRN

## 2019-10-20 MED ORDER — ANORO ELLIPTA 62.5-25 MCG/INH IN AEPB: INHALATION_SPRAY | RESPIRATORY_TRACT | 11 refills | Status: DC

## 2019-10-20 NOTE — Progress Notes (Signed)
Synopsis: Referred in 2018 for COPD by Shannon Cruel, MD.  Formerly a patient of Dr. Lake Roberts & Dr. Ashok Roberts.  Subjective:   PATIENT ID: Shannon Roberts GENDER: female DOB: 04/08/1951, MRN: [id number]  Chief Complaint  Patient presents with   Follow-up    Patient has been feeling good since last visit in October. Patient does have shortness of breath with some exertion.     Virtual Visit via Telephone Note  I connected with Shannon Roberts on 10/20/19 at  4:00 PM EST by telephone and verified that I am speaking with the correct person using two identifiers.  Location: Patient: Home Provider: [facility] - [id number], [place], [place], [place] [address]    I discussed the limitations, risks, security and privacy concerns of performing an evaluation and management service by telephone and the availability of in person appointments. I also discussed with the patient that there may be a patient responsible charge related to this service. The patient expressed understanding and agreed to proceed.  Patient consented to consult via telephone: Yes People present and their role in pt care: Pt   I discussed the assessment and treatment plan with the patient. The patient was provided an opportunity to ask questions and all were answered. The patient agreed with the plan and demonstrated an understanding of the instructions.   The patient was advised to call back or seek an in-person evaluation if the symptoms worsen or if the condition fails to improve as anticipated.  I provided 15 minutes of non-face-to-face time during this encounter.    Shannon Roberts is a 68 year old woman with a history of COPD.  She recently had an exacerbation treated with doxycycline and prednisone, which improved her symptoms and she is back to her baseline.  Her maintenance therapy is Anoro, which she has been compliant with.  She reports that she has about 2 exacerbations per year, but this  was the worst that she has had.  She is never required hospitalization for an exacerbation.  At baseline she has dyspnea on exertion with housework and walking upstairs.  She uses her albuterol infrequently.  She denies chronic cough, sputum, wheezing.  She has an upcoming dental procedure scheduled for January for an odontogenic infection.  She is up-to-date on her seasonal flu vaccine, and she has had both pneumonia vaccines, but her pneumonia 23 vaccine was at age 68.      Past Medical History:  Diagnosis Date   Allergic rhinitis    Anemia    per hosp. - recently- pt. told that she is anemic   Anxiety    Colitis    COPD (chronic obstructive pulmonary disease) (HCC)    no o2   Depression    Diverticulosis    Female bladder prolapse    Fibroids    GERD (gastroesophageal reflux disease)    Heart murmur    Hx of adenomatous colonic polyps 08/26/2019   Hyperlipemia    Hypertension    IBS (irritable bowel syndrome)    Insomnia    Lip abscess 04/05/2016   Mitral valve prolapse    Peripheral vascular disease ([address])    Pre-diabetes 08/03/2019   Diet controlled     Right bundle branch block    Sciatic nerve disease    Shortness of breath    Stroke [facility]) March 2015     Family History  Problem Relation Age of Onset   Breast cancer Mother    Ovarian cancer  Mother    AAA (abdominal aortic aneurysm) Mother    Emphysema Mother    Lung cancer Father    Emphysema Paternal Aunt    Pancreatic cancer Maternal Grandmother    Diabetes Maternal Aunt    Breast cancer Maternal Aunt    Heart disease Paternal Grandfather    Diabetes Paternal Grandfather    Kidney disease Maternal Aunt    Colon cancer Paternal Aunt    Colon polyps Neg Hx    Esophageal cancer Neg Hx    Rectal cancer Neg Hx    Stomach cancer Neg Hx      Past Surgical History:  Procedure Laterality Date   CAROTID ENDARTERECTOMY Right January 17, 2014   CE   CHOLECYSTECTOMY      COLONOSCOPY     ENDARTERECTOMY Right 01/17/2014   Procedure: RIGHT CAROTID ARTERY ENDARTERECTOMY WITH DACRON PATCH ANGIOPLASTY;  Surgeon: Rosetta Posner, MD;  Location: [facility] OR;  Service: Vascular;  Laterality: Right;   UPPER GASTROINTESTINAL ENDOSCOPY      Social History   Socioeconomic History   Marital status: Divorced    Spouse name: Not on file   Number of children: 2   Years of education: ASSOCIATES   Highest education level: Not on file  Occupational History   Occupation: Bookeeper  Tobacco Use   Smoking status: Former Smoker    Packs/day: 1.50    Years: 47.00    Pack years: 70.50    Types: Cigarettes    Start date: 12/03/1966    Quit date: 01/02/2014    Years since quitting: 5.8   Smokeless tobacco: Never Used   Tobacco comment: Stopped for 6 months once in 2007  Substance and Sexual Activity   Alcohol use: No    Alcohol/week: 0.0 standard drinks   Drug use: No   Sexual activity: Not Currently  Other Topics Concern   Not on file  Social History Narrative   Patient is divorced with 2 children.   Patient is right handed.   Patient has her Associates degree.   Patient drinks 3 cups daily.      [facility] (07/30/17):   Originally from [facility]. Has always lived in [place]. 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 Previously had cockatoo and a [place] for months. Did have mold in her current home that was remediated. Enjoys playing on the computer, reading, & babysitting.     Social Determinants of Health   Financial Resource Strain:    Difficulty of Paying Living Expenses: Not on file  Food Insecurity:    Worried About Charity fundraiser in the Last Year: Not on file   [employer] of Food in the Last Year: Not on file  Transportation Needs:    Lack of Transportation (Medical): Not on file   Lack of Transportation (Non-Medical): Not on file  Physical Activity:    Days of Exercise per Week: Not on file   Minutes of Exercise per  Session: Not on file  Stress:    Feeling of Stress : Not on file  Social Connections:    Frequency of Communication with Friends and Family: Not on file   Frequency of Social Gatherings with Friends and Family: Not on file   Attends Religious Services: Not on file   Active Member of Clubs or Organizations: Not on file   Attends Archivist Meetings: Not on file   Marital Status: Not on file  Intimate Partner Violence:    Fear  of Current or Ex-Partner: Not on file   Emotionally Abused: Not on file   Physically Abused: Not on file   Sexually Abused: Not on file     Allergies  Allergen Reactions   Carvedilol Palpitations   Dapsone Other (See Comments)    Reaction:  Flushing    Niacin And Related Other (See Comments)    Reaction:  Flushing    Septra [Sulfamethoxazole-Trimethoprim]     Nausea, vomiting, diarrhea   Wellbutrin [Bupropion]     Caused jittery    Amoxicillin Rash and Other (See Comments)    Has patient had a PCN reaction causing immediate rash, facial/tongue/throat swelling, SOB or lightheadedness with hypotension: No Has patient had a PCN reaction causing severe rash involving mucus membranes or skin necrosis: No Has patient had a PCN reaction that required hospitalization No Has patient had a PCN reaction occurring within the last 10 years: No If all of the above answers are "NO", then may proceed with Cephalosporin use.     Immunization History  Administered Date(s) Administered   Influenza, High Dose Seasonal PF 07/30/2017, 07/31/2018, 07/24/2019   Pneumococcal Conjugate-13 01/31/2016   Pneumococcal Polysaccharide-23 01/18/2014    Outpatient Medications Prior to Visit  Medication Sig Dispense Refill   acetaminophen (TYLENOL) 500 MG tablet Take 1,000 mg by mouth every 6 (six) hours as needed for mild pain, moderate pain, fever or headache.      amLODipine (NORVASC) 10 MG tablet Take 5 mg by mouth daily.      atenolol (TENORMIN)  100 MG tablet Take 1 tablet (100 mg total) by mouth daily. (Patient taking differently: Take 50 mg by mouth daily. ) 30 tablet 1   Cholecalciferol (VITAMIN D3) 5000 units CAPS Take 5,000 Units by mouth every evening.      clindamycin (CLEOCIN) 150 MG capsule Take 150 mg by mouth 3 (three) times daily.     clopidogrel (PLAVIX) 75 MG tablet Take 75 mg by mouth daily.      DULoxetine (CYMBALTA) 30 MG capsule Take 90 mg by mouth every evening.     ferrous sulfate 325 (65 FE) MG tablet Take 325 mg by mouth daily with breakfast.     fluticasone (FLONASE) 50 MCG/ACT nasal spray Place 2 sprays into both nostrils daily as needed for allergies or rhinitis.     ipratropium-albuterol (DUONEB) 0.5-2.5 (3) MG/3ML SOLN Take 3 mLs by nebulization every 6 (six) hours as needed (for wheezing/shortness of breath).     isosorbide mononitrate (IMDUR) 30 MG 24 hr tablet Take 30 mg by mouth 2 (two) times daily.  0   lisinopril (PRINIVIL,ZESTRIL) 40 MG tablet Take 40 mg by mouth daily.     LORazepam (ATIVAN) 0.5 MG tablet Take 0.5 mg by mouth 2 (two) times daily as needed for anxiety.   0   pantoprazole (PROTONIX) 40 MG tablet Take 40 mg by mouth daily.      Probiotic Product (PROBIOTIC-10 PO) Take by mouth daily.     simvastatin (ZOCOR) 10 MG tablet Take 1 tablet (10 mg total) by mouth at bedtime. 30 tablet 3   traMADol (ULTRAM) 50 MG tablet Take 50 mg by mouth every 6 (six) hours as needed for moderate pain.      umeclidinium-vilanterol (ANORO ELLIPTA) 62.5-25 MCG/INH AEPB Inhale 1 puff into the lungs daily. 60 each 5   Ipratropium-Albuterol (COMBIVENT RESPIMAT) 20-100 MCG/ACT AERS respimat Take 1 puff by mouth every 6 (six) hours as needed for wheezing or shortness of breath.  umeclidinium-vilanterol (ANORO ELLIPTA) 62.5-25 MCG/INH AEPB INHALE 1 PUFF INTO THE LUNGS DAILY 60 each 1   doxycycline (VIBRA-TABS) 100 MG tablet Take 1 tablet (100 mg total) by mouth 2 (two) times daily. 14 tablet 0    Ergocalciferol (VITAMIN D2 PO) Take 1,250 mcg by mouth once a week.     polyethylene glycol (MIRALAX / GLYCOLAX) 17 g packet Take 17 g by mouth as needed.     predniSONE (DELTASONE) 10 MG tablet Take 2 tabs x 5 days 10 tablet 0   No facility-administered medications prior to visit.    Review of Systems  Constitutional: Negative for chills and fever.  HENT: Negative.   Respiratory: Positive for shortness of breath. Negative for cough and wheezing.   Cardiovascular: Negative.   Gastrointestinal: Negative.   Musculoskeletal: Negative.      Objective:  There were no vitals filed for this visit.   on   RA BMI Readings from Last 3 Encounters:  08/11/19 32.85 kg/m  08/03/19 32.85 kg/m  07/05/19 32.28 kg/m   Wt Readings from Last 3 Encounters:  08/11/19 191 lb 6.4 oz (86.8 kg)  08/03/19 191 lb 6.4 oz (86.8 kg)  07/05/19 191 lb (86.6 kg)    Physical Exam Physical exam limited due to televisit.  Answering questions appropriately, no conversational dyspnea.  No coughing during our phone call.  CBC    Component Value Date/Time   WBC 10.8 (H) 01/30/2017 2008   RBC 3.70 (L) 01/30/2017 2008   HGB 11.4 (L) 01/30/2017 2008   HCT 34.1 (L) 01/30/2017 2008   PLT 270 01/30/2017 2008   MCV 92.2 01/30/2017 2008   MCV 96.0 01/02/2014 1825   MCH 30.8 01/30/2017 2008   MCHC 33.4 01/30/2017 2008   RDW 12.8 01/30/2017 2008   LYMPHSABS 1.6 12/26/2014 1119   MONOABS 0.4 12/26/2014 1119   EOSABS 0.3 12/26/2014 1119   BASOSABS 0.1 12/26/2014 1119    CHEMISTRY No results for input(s): NA, K, CL, CO2, GLUCOSE, BUN, CREATININE, CALCIUM, MG, PHOS in the last 168 hours. CrCl cannot be calculated (Patient's most recent lab result is older than the maximum 21 days allowed.).   Chest Imaging- films reviewed: CXR, 2 view 08/17/2019-airway thickening bilaterally, increased bibasilar markings.  Increased retrosternal airspace.  Pulmonary Functions Testing Results: PFT Results Latest Ref Rng &  Units 09/01/2017  FVC-Pre L 1.92  FVC-Predicted Pre % 61  FVC-Post L 2.23  FVC-Predicted Post % 71  Pre FEV1/FVC % % 52  Post FEV1/FCV % % 56  FEV1-Pre L 1.00  FEV1-Predicted Pre % 42  FEV1-Post L 1.25  DLCO UNC% % 49  DLCO COR %Predicted % 68  TLC L 5.89  TLC % Predicted % 114  RV % Predicted % 175   Severe obstruction with significant bronchodilator reversibility.  Hyperinflation with air trapping.  Moderately reduced diffusion capacity.      Assessment & Plan:     ICD-10-CM   1. Chronic obstructive pulmonary disease, unspecified COPD type ([place])  J44.9     COPD-2 minor exacerbations per year on average.  GOLD B. -Continue Anoro daily -Continue combivent as needed -Up-to-date on seasonal flu vaccine.  Needs repeat vaccination with pneumococcal 23 vaccine.  She will look into this at her PCPs office or can receive it next time she is in our office. -Continue Covid precautions-social distancing, mask wearing, handwashing.  I recommended that she receive the vaccine when it becomes available to her age group. -Continue to avoid all tobacco products  RTC in 3 months.    Current Outpatient Medications:    acetaminophen (TYLENOL) 500 MG tablet, Take 1,000 mg by mouth every 6 (six) hours as needed for mild pain, moderate pain, fever or headache. , Disp: , Rfl:    amLODipine (NORVASC) 10 MG tablet, Take 5 mg by mouth daily. , Disp: , Rfl:    atenolol (TENORMIN) 100 MG tablet, Take 1 tablet (100 mg total) by mouth daily. (Patient taking differently: Take 50 mg by mouth daily. ), Disp: 30 tablet, Rfl: 1   Cholecalciferol (VITAMIN D3) 5000 units CAPS, Take 5,000 Units by mouth every evening. , Disp: , Rfl:    clindamycin (CLEOCIN) 150 MG capsule, Take 150 mg by mouth 3 (three) times daily., Disp: , Rfl:    clopidogrel (PLAVIX) 75 MG tablet, Take 75 mg by mouth daily. , Disp: , Rfl:    DULoxetine (CYMBALTA) 30 MG capsule, Take 90 mg by mouth every evening., Disp: , Rfl:     ferrous sulfate 325 (65 FE) MG tablet, Take 325 mg by mouth daily with breakfast., Disp: , Rfl:    fluticasone (FLONASE) 50 MCG/ACT nasal spray, Place 2 sprays into both nostrils daily as needed for allergies or rhinitis., Disp: , Rfl:    Ipratropium-Albuterol (COMBIVENT RESPIMAT) 20-100 MCG/ACT AERS respimat, Inhale 1 puff into the lungs every 4 (four) hours as needed for wheezing or shortness of breath., Disp: 4 g, Rfl: 11   ipratropium-albuterol (DUONEB) 0.5-2.5 (3) MG/3ML SOLN, Take 3 mLs by nebulization every 6 (six) hours as needed (for wheezing/shortness of breath)., Disp: , Rfl:    isosorbide mononitrate (IMDUR) 30 MG 24 hr tablet, Take 30 mg by mouth 2 (two) times daily., Disp: , Rfl: 0   lisinopril (PRINIVIL,ZESTRIL) 40 MG tablet, Take 40 mg by mouth daily., Disp: , Rfl:    LORazepam (ATIVAN) 0.5 MG tablet, Take 0.5 mg by mouth 2 (two) times daily as needed for anxiety. , Disp: , Rfl: 0   pantoprazole (PROTONIX) 40 MG tablet, Take 40 mg by mouth daily. , Disp: , Rfl:    Probiotic Product (PROBIOTIC-10 PO), Take by mouth daily., Disp: , Rfl:    simvastatin (ZOCOR) 10 MG tablet, Take 1 tablet (10 mg total) by mouth at bedtime., Disp: 30 tablet, Rfl: 3   traMADol (ULTRAM) 50 MG tablet, Take 50 mg by mouth every 6 (six) hours as needed for moderate pain. , Disp: , Rfl:    umeclidinium-vilanterol (ANORO ELLIPTA) 62.5-25 MCG/INH AEPB, Inhale 1 puff into the lungs daily., Disp: 60 each, Rfl: 5   umeclidinium-vilanterol (ANORO ELLIPTA) 62.5-25 MCG/INH AEPB, INHALE 1 PUFF INTO THE LUNGS DAILY, Disp: 30 each, Rfl: 11   Julian Hy, DO [facility] 10/20/2019 1:31 PM

## 2019-10-20 NOTE — Patient Instructions (Addendum)
Thank you for visiting Dr. Carlis Abbott at [facility]. We recommend the following:   Meds ordered this encounter  Medications  . umeclidinium-vilanterol (ANORO ELLIPTA) 62.5-25 MCG/INH AEPB    Sig: INHALE 1 PUFF INTO THE LUNGS DAILY    Dispense:  30 each    Refill:  11  . Ipratropium-Albuterol (COMBIVENT RESPIMAT) 20-100 MCG/ACT AERS respimat    Sig: Inhale 1 puff into the lungs every 4 (four) hours as needed for wheezing or shortness of breath.    Dispense:  4 g    Refill:  11     Needs repeat pneumonia 23 vaccine (last had at age 5).  Return in about 3 months (around 01/18/2020).    Please do your part to reduce the spread of COVID-19.

## 2019-10-26 ENCOUNTER — Telehealth: Payer: Self-pay

## 2019-10-26 NOTE — Telephone Encounter (Signed)
PA request received from [facility] requested: Combivent Respimat No CMM key provided, and when I tried to enter patient manually I was told that pt could not be found by member ID #. Called [phone] to initiate PA.  Spoke with Elmo Putt, and PA has been sent to clinical review.  A determination is to be faxed to our office within 3-5 business days.  Routing to [place] for follow-up as she is managing Dr. Carlis Abbott.

## 2019-10-27 ENCOUNTER — Telehealth: Payer: Self-pay | Admitting: Critical Care Medicine

## 2019-10-27 NOTE — Telephone Encounter (Signed)
[employer] medicare called to state approval for the combivent  This is a FYI good for 1 year

## 2019-10-27 NOTE — Telephone Encounter (Signed)
Dr. Carlis Abbott,   This patient was seen by you on 10/20/19 for COPD. When I called the patient she stated the procedure would take between 30 - 45 minutes.  Based on the office notes, is there any reason for concern for the patient?

## 2019-10-27 NOTE — Telephone Encounter (Signed)
Called [facility] [phone]) to advise the prior authorization was approved. Pharmacist ran prescription and confirmed it was able to go through and they will contact the patient to make her aware of her deductible based on insurance.  Nothing further needed at this time.

## 2019-10-27 NOTE — Telephone Encounter (Signed)
I would prefer if she can do without it, but if she needs it that is fine. Thanks!  LPC

## 2019-10-28 NOTE — Telephone Encounter (Signed)
lmtcb for pt.  

## 2019-10-28 NOTE — Telephone Encounter (Signed)
[phone] pt calling back

## 2019-10-28 NOTE — Telephone Encounter (Signed)
Called and spoke to pt. Informed her of the recs per Dr. Carlis Abbott. Pt verbalized understanding and denied any further questions or concerns at this time.

## 2020-01-18 DIAGNOSIS — H43813 Vitreous degeneration, bilateral: Secondary | ICD-10-CM | POA: Diagnosis not present

## 2020-01-18 DIAGNOSIS — H2513 Age-related nuclear cataract, bilateral: Secondary | ICD-10-CM | POA: Diagnosis not present

## 2020-02-09 DIAGNOSIS — D649 Anemia, unspecified: Secondary | ICD-10-CM | POA: Diagnosis not present

## 2020-02-09 DIAGNOSIS — E559 Vitamin D deficiency, unspecified: Secondary | ICD-10-CM | POA: Diagnosis not present

## 2020-02-09 DIAGNOSIS — E782 Mixed hyperlipidemia: Secondary | ICD-10-CM | POA: Diagnosis not present

## 2020-02-09 DIAGNOSIS — I10 Essential (primary) hypertension: Secondary | ICD-10-CM | POA: Diagnosis not present

## 2020-02-11 DIAGNOSIS — E1169 Type 2 diabetes mellitus with other specified complication: Secondary | ICD-10-CM | POA: Diagnosis not present

## 2020-02-11 DIAGNOSIS — I679 Cerebrovascular disease, unspecified: Secondary | ICD-10-CM | POA: Diagnosis not present

## 2020-02-11 DIAGNOSIS — J449 Chronic obstructive pulmonary disease, unspecified: Secondary | ICD-10-CM | POA: Diagnosis not present

## 2020-02-11 DIAGNOSIS — Z0000 Encounter for general adult medical examination without abnormal findings: Secondary | ICD-10-CM | POA: Diagnosis not present

## 2020-07-18 ENCOUNTER — Telehealth: Payer: Self-pay | Admitting: Critical Care Medicine

## 2020-07-19 NOTE — Telephone Encounter (Signed)
Called and spoke with pt and answered questions she had in regards to timing for covid booster and flu shot. Nothing further needed.

## 2020-07-25 ENCOUNTER — Ambulatory Visit: Payer: Medicare Other

## 2020-07-25 ENCOUNTER — Ambulatory Visit: Payer: Medicare Other | Attending: Internal Medicine

## 2020-07-25 DIAGNOSIS — Z23 Encounter for immunization: Secondary | ICD-10-CM

## 2020-07-25 NOTE — Progress Notes (Signed)
° °  Covid-19 Vaccination Clinic  Name:  Shannon Roberts    MRN: [id number] DOB: 1951/07/07  07/25/2020  [name] was observed post Covid-19 immunization for 15 minutes without incident. She was provided with Vaccine Information Sheet and instruction to access the V-Safe system.   [name] was instructed to call 911 with any severe reactions post vaccine:  Difficulty breathing   Swelling of face and throat   A fast heartbeat   A bad rash all over body   Dizziness and weakness

## 2020-08-14 DIAGNOSIS — Z23 Encounter for immunization: Secondary | ICD-10-CM | POA: Diagnosis not present

## 2020-08-28 DIAGNOSIS — M549 Dorsalgia, unspecified: Secondary | ICD-10-CM | POA: Diagnosis not present

## 2020-08-28 DIAGNOSIS — F419 Anxiety disorder, unspecified: Secondary | ICD-10-CM | POA: Diagnosis not present

## 2020-10-16 ENCOUNTER — Other Ambulatory Visit: Payer: Self-pay | Admitting: Critical Care Medicine

## 2020-11-06 ENCOUNTER — Ambulatory Visit: Payer: Medicare Other | Admitting: Acute Care

## 2020-11-13 ENCOUNTER — Other Ambulatory Visit: Payer: Self-pay | Admitting: Critical Care Medicine

## 2020-11-15 ENCOUNTER — Encounter: Payer: Self-pay | Admitting: Acute Care

## 2020-11-15 ENCOUNTER — Ambulatory Visit (INDEPENDENT_AMBULATORY_CARE_PROVIDER_SITE_OTHER): Payer: Medicare Other | Admitting: Acute Care

## 2020-11-15 ENCOUNTER — Other Ambulatory Visit: Payer: Self-pay

## 2020-11-15 DIAGNOSIS — J449 Chronic obstructive pulmonary disease, unspecified: Secondary | ICD-10-CM | POA: Diagnosis not present

## 2020-11-15 DIAGNOSIS — Z0000 Encounter for general adult medical examination without abnormal findings: Secondary | ICD-10-CM

## 2020-11-15 MED ORDER — ANORO ELLIPTA 62.5-25 MCG/INH IN AEPB: INHALATION_SPRAY | RESPIRATORY_TRACT | 0 refills | Status: DC

## 2020-11-15 MED ORDER — COMBIVENT RESPIMAT 20-100 MCG/ACT IN AERS: 1.0000 | INHALATION_SPRAY | RESPIRATORY_TRACT | 11 refills | Status: DC | PRN

## 2020-11-15 NOTE — Progress Notes (Signed)
Virtual Visit via Telephone Note  I connected with Shannon Roberts on 11/15/20 at  4:30 PM EST by telephone and verified that I am speaking with the correct person using two identifiers.  Location: Patient: At home Provider: [address], [place], [place], [address]   I discussed the limitations, risks, security and privacy concerns of performing an evaluation and management service by telephone and the availability of in person appointments. I also discussed with the patient that there may be a patient responsible charge related to this service. The patient expressed understanding and agreed to proceed.  Synopsis Shannon Roberts is a 70 year old woman former smoker ( Quit 2015 with a 70.5 pack year smoking history) with a history of COPD.   Her maintenance therapy is Anoro, which she has been compliant with. Her rescue is Combivent, she uses this once daily before bed. She has Duonebs, but she rarely has to use them. She reports that she has about 2 exacerbations per year. She is never required hospitalization for an exacerbation.  At baseline she has dyspnea on exertion with housework and walking upstairs.  She uses her combivent  Once daily .  She denies chronic cough, sputum, wheezing.  She has had both covid vaccines and her booster. She is up to date on flu. She will need pneumococcal this year. (Last dose 2015)   History of Present Illness: Pt. Presents for annual follow up for COPD via tele visit.She states she has been doing well. She states she has not had any flares since then. She has been using her Anoro every morning, and Combivent at bedtime x 1. She states she has very scant secretions, very seldom. Secretions are clear. No fever, chest pain, no worsening of her dyspnea. She denies any wheezing. She is fully vaccinated and boosted for Covid 19. She  is up to date on her flu shot ( 07/2020), she is due for her pneumococcal vaccine this year. She has had a stable interval.      Observations/Objective:  Stable interval No Flares Speaking in full sentences, no wheezing. Compliant with medications   Chest Imaging- films reviewed: CXR, 2 view 08/17/2019-airway thickening bilaterally, increased bibasilar markings.  Increased retrosternal airspace.  Pulmonary Functions Testing Results: PFT Results Latest Ref Rng & Units 09/01/2017  FVC-Pre L 1.92  FVC-Predicted Pre % 61  FVC-Post L 2.23  FVC-Predicted Post % 71  Pre FEV1/FVC % % 52  Post FEV1/FCV % % 56  FEV1-Pre L 1.00  FEV1-Predicted Pre % 42  FEV1-Post L 1.25  DLCO UNC% % 49  DLCO COR %Predicted % 68  TLC L 5.89  TLC % Predicted % 114  RV % Predicted % 175   Severe obstruction with significant bronchodilator reversibility.  Hyperinflation with air trapping.  Moderately reduced diffusion capacity.    Assessment and Plan: Severe COPD + BD response Hyperinflation with Air trapping Plan Continue your Anoro 1 puff once daily Rinse mouth after use Use your Combivent for breakthrough shortness of breath.( 1 puff every 4 hours as needed) We have re-ordered these medications.  If you need your rescue more frequently, please call to be seen.  Note your daily symptoms > remember "red flags" for COPD:  Increase in cough, increase in sputum production, increase in shortness of breath or activity intolerance. If you notice these symptoms, please call to be seen.  Congratulations on being Covid vaccinated and boosted.  Continue Covid precautions-social distancing, mask wearing, handwashing Please schedule a Pneumonia vaccine this  year through the office, as this is due. Please contact office for sooner follow up if symptoms do not improve or worsen or seek emergency care    Healthcare Maintenance Fully Covid vaccinated and boosted Needs Pneumococcal Plan Please schedule a Pneumonia vaccine this year through the office, as this is due.  Follow Up Instructions: Follow up in 6 months with Judson Roch NP or  Dr. Erin Fulling.( New Provider as Dr. Carlis Abbott is no longer seeing patient's in the office)     I discussed the assessment and treatment plan with the patient. The patient was provided an opportunity to ask questions and all were answered. The patient agreed with the plan and demonstrated an understanding of the instructions.   The patient was advised to call back or seek an in-person evaluation if the symptoms worsen or if the condition fails to improve as anticipated.  I provided 30 minutes of non-face-to-face time during this encounter.   Magdalen Spatz, NP  11/15/2020 5:09 PM

## 2020-11-15 NOTE — Patient Instructions (Addendum)
It is good to talk to you today. I am so glad you have had a stable interval with no flares of your COPD.  Continue your Anoro 1 puff once daily Rinse mouth after use Use your Combivent for breakthrough shortness of breath.( 1 puff every 4 hours as needed) We have re-ordered these medications.  If you need your rescue more frequently, please call to be seen.  Note your daily symptoms > remember "red flags" for COPD:  Increase in cough, increase in sputum production, increase in shortness of breath or activity intolerance. If you notice these symptoms, please call to be seen.  Congratulations on being Covid vaccinated and boosted.  Continue Covid precautions-social distancing, mask wearing, handwashing Please schedule a Pneumonia vaccine this year through the office, as this is due. Follow up in 6 months with Judson Roch NP or Dr. Erin Fulling.  Please contact office for sooner follow up if symptoms do not improve or worsen or seek emergency care

## 2021-01-09 ENCOUNTER — Other Ambulatory Visit: Payer: Self-pay | Admitting: Acute Care

## 2021-01-10 DIAGNOSIS — N390 Urinary tract infection, site not specified: Secondary | ICD-10-CM | POA: Diagnosis not present

## 2021-02-09 DIAGNOSIS — E119 Type 2 diabetes mellitus without complications: Secondary | ICD-10-CM | POA: Diagnosis not present

## 2021-02-09 DIAGNOSIS — F419 Anxiety disorder, unspecified: Secondary | ICD-10-CM | POA: Diagnosis not present

## 2021-02-09 DIAGNOSIS — E782 Mixed hyperlipidemia: Secondary | ICD-10-CM | POA: Diagnosis not present

## 2021-02-09 DIAGNOSIS — I10 Essential (primary) hypertension: Secondary | ICD-10-CM | POA: Diagnosis not present

## 2021-02-09 DIAGNOSIS — E559 Vitamin D deficiency, unspecified: Secondary | ICD-10-CM | POA: Diagnosis not present

## 2021-02-12 DIAGNOSIS — Z0000 Encounter for general adult medical examination without abnormal findings: Secondary | ICD-10-CM | POA: Diagnosis not present

## 2021-02-12 DIAGNOSIS — I10 Essential (primary) hypertension: Secondary | ICD-10-CM | POA: Diagnosis not present

## 2021-02-12 DIAGNOSIS — E559 Vitamin D deficiency, unspecified: Secondary | ICD-10-CM | POA: Diagnosis not present

## 2021-02-12 DIAGNOSIS — E782 Mixed hyperlipidemia: Secondary | ICD-10-CM | POA: Diagnosis not present

## 2021-02-14 ENCOUNTER — Ambulatory Visit: Payer: Medicare Other | Admitting: Acute Care

## 2021-02-27 ENCOUNTER — Ambulatory Visit: Payer: Medicare Other | Admitting: Pulmonary Disease

## 2021-04-03 ENCOUNTER — Ambulatory Visit: Payer: Medicare Other | Admitting: Pulmonary Disease

## 2021-05-10 ENCOUNTER — Other Ambulatory Visit: Payer: Self-pay | Admitting: Acute Care

## 2021-05-11 ENCOUNTER — Telehealth: Payer: Self-pay | Admitting: Acute Care

## 2021-05-11 NOTE — Telephone Encounter (Signed)
ATC x1.  LVM to contact her pcp.  Left message that if she has any further questions she can call the office on Monday.

## 2021-05-15 ENCOUNTER — Ambulatory Visit: Payer: Medicare Other | Admitting: Pulmonary Disease

## 2021-05-26 ENCOUNTER — Emergency Department (HOSPITAL_COMMUNITY): Payer: Medicare Other

## 2021-05-26 ENCOUNTER — Other Ambulatory Visit: Payer: Self-pay

## 2021-05-26 ENCOUNTER — Emergency Department (HOSPITAL_COMMUNITY)
Admission: EM | Admit: 2021-05-26 | Discharge: 2021-05-26 | Disposition: A | Discharge status: Home / Self Care | Payer: Medicare Other | Attending: Emergency Medicine | Admitting: Emergency Medicine

## 2021-05-26 DIAGNOSIS — M47814 Spondylosis without myelopathy or radiculopathy, thoracic region: Secondary | ICD-10-CM | POA: Diagnosis not present

## 2021-05-26 DIAGNOSIS — R0789 Other chest pain: Secondary | ICD-10-CM | POA: Diagnosis not present

## 2021-05-26 DIAGNOSIS — I714 Abdominal aortic aneurysm, without rupture, unspecified: Secondary | ICD-10-CM

## 2021-05-26 DIAGNOSIS — R051 Acute cough: Secondary | ICD-10-CM | POA: Diagnosis not present

## 2021-05-26 DIAGNOSIS — N183 Chronic kidney disease, stage 3 unspecified: Secondary | ICD-10-CM | POA: Diagnosis not present

## 2021-05-26 DIAGNOSIS — I129 Hypertensive chronic kidney disease with stage 1 through stage 4 chronic kidney disease, or unspecified chronic kidney disease: Secondary | ICD-10-CM | POA: Diagnosis not present

## 2021-05-26 DIAGNOSIS — Z7902 Long term (current) use of antithrombotics/antiplatelets: Secondary | ICD-10-CM | POA: Insufficient documentation

## 2021-05-26 DIAGNOSIS — J449 Chronic obstructive pulmonary disease, unspecified: Secondary | ICD-10-CM | POA: Insufficient documentation

## 2021-05-26 DIAGNOSIS — N83201 Unspecified ovarian cyst, right side: Secondary | ICD-10-CM | POA: Diagnosis not present

## 2021-05-26 DIAGNOSIS — M25519 Pain in unspecified shoulder: Secondary | ICD-10-CM | POA: Insufficient documentation

## 2021-05-26 DIAGNOSIS — R5383 Other fatigue: Secondary | ICD-10-CM | POA: Diagnosis not present

## 2021-05-26 DIAGNOSIS — Z87891 Personal history of nicotine dependence: Secondary | ICD-10-CM | POA: Insufficient documentation

## 2021-05-26 DIAGNOSIS — I10 Essential (primary) hypertension: Secondary | ICD-10-CM

## 2021-05-26 DIAGNOSIS — R0602 Shortness of breath: Secondary | ICD-10-CM | POA: Insufficient documentation

## 2021-05-26 DIAGNOSIS — Z7951 Long term (current) use of inhaled steroids: Secondary | ICD-10-CM | POA: Diagnosis not present

## 2021-05-26 DIAGNOSIS — R079 Chest pain, unspecified: Secondary | ICD-10-CM

## 2021-05-26 DIAGNOSIS — N281 Cyst of kidney, acquired: Secondary | ICD-10-CM | POA: Diagnosis not present

## 2021-05-26 DIAGNOSIS — I701 Atherosclerosis of renal artery: Secondary | ICD-10-CM | POA: Diagnosis not present

## 2021-05-26 DIAGNOSIS — Z79899 Other long term (current) drug therapy: Secondary | ICD-10-CM | POA: Diagnosis not present

## 2021-05-26 DIAGNOSIS — J069 Acute upper respiratory infection, unspecified: Secondary | ICD-10-CM | POA: Diagnosis not present

## 2021-05-26 LAB — I-STAT CHEM 8, ED
BUN: 19 mg/dL (ref 8–23)
Calcium, Ion: 1.15 mmol/L (ref 1.15–1.40)
Chloride: 104 mmol/L (ref 98–111)
Creatinine, Ser: 1.1 mg/dL — ABNORMAL HIGH (ref 0.44–1.00)
Glucose, Bld: 130 mg/dL — ABNORMAL HIGH (ref 70–99)
HCT: 32 % — ABNORMAL LOW (ref 36.0–46.0)
Hemoglobin: 10.9 g/dL — ABNORMAL LOW (ref 12.0–15.0)
Potassium: 4 mmol/L (ref 3.5–5.1)
Sodium: 137 mmol/L (ref 135–145)
TCO2: 25 mmol/L (ref 22–32)

## 2021-05-26 LAB — CBC
HCT: 35.6 % — ABNORMAL LOW (ref 36.0–46.0)
Hemoglobin: 12 g/dL (ref 12.0–15.0)
MCH: 31.3 pg (ref 26.0–34.0)
MCHC: 33.7 g/dL (ref 30.0–36.0)
MCV: 93 fL (ref 80.0–100.0)
Platelets: 241 10*3/uL (ref 150–400)
RBC: 3.83 MIL/uL — ABNORMAL LOW (ref 3.87–5.11)
RDW: 12.5 % (ref 11.5–15.5)
WBC: 8 10*3/uL (ref 4.0–10.5)
nRBC: 0 % (ref 0.0–0.2)

## 2021-05-26 LAB — BASIC METABOLIC PANEL
Anion gap: 10 (ref 5–15)
BUN: 19 mg/dL (ref 8–23)
CO2: 22 mmol/L (ref 22–32)
Calcium: 9.5 mg/dL (ref 8.9–10.3)
Chloride: 98 mmol/L (ref 98–111)
Creatinine, Ser: 1.31 mg/dL — ABNORMAL HIGH (ref 0.44–1.00)
GFR, Estimated: 44 mL/min — ABNORMAL LOW (ref >60)
Glucose, Bld: 142 mg/dL — ABNORMAL HIGH (ref 70–99)
Potassium: 4.2 mmol/L (ref 3.5–5.1)
Sodium: 130 mmol/L — ABNORMAL LOW (ref 135–145)

## 2021-05-26 LAB — HEPATIC FUNCTION PANEL
ALT: 18 U/L (ref 0–44)
AST: 23 U/L (ref 15–41)
Albumin: 4 g/dL (ref 3.5–5.0)
Alkaline Phosphatase: 53 U/L (ref 38–126)
Bilirubin, Direct: 0.1 mg/dL (ref 0.0–0.2)
Indirect Bilirubin: 0.9 mg/dL (ref 0.3–0.9)
Total Bilirubin: 1 mg/dL (ref 0.3–1.2)
Total Protein: 7.8 g/dL (ref 6.5–8.1)

## 2021-05-26 LAB — TROPONIN I (HIGH SENSITIVITY)
Troponin I (High Sensitivity): 12 ng/L (ref <18)
Troponin I (High Sensitivity): 9 ng/L (ref <18)

## 2021-05-26 LAB — LIPASE, BLOOD: Lipase: 41 U/L (ref 11–51)

## 2021-05-26 MED ORDER — MORPHINE SULFATE (PF) 4 MG/ML IV SOLN
4.0000 mg | Freq: Once | INTRAVENOUS | Status: AC
  Administered 2021-05-26: 2 mg via INTRAVENOUS
  Filled 2021-05-26: qty 1

## 2021-05-26 MED ORDER — LORAZEPAM 2 MG/ML IJ SOLN
1.0000 mg | Freq: Once | INTRAMUSCULAR | Status: AC
  Administered 2021-05-26: 1 mg via INTRAVENOUS
  Filled 2021-05-26: qty 1

## 2021-05-26 MED ORDER — MORPHINE SULFATE (PF) 2 MG/ML IV SOLN
2.0000 mg | Freq: Once | INTRAVENOUS | Status: DC
  Filled 2021-05-26: qty 1

## 2021-05-26 MED ORDER — HYDRALAZINE HCL 20 MG/ML IJ SOLN
5.0000 mg | Freq: Once | INTRAMUSCULAR | Status: AC
  Administered 2021-05-26: 5 mg via INTRAVENOUS
  Filled 2021-05-26: qty 1

## 2021-05-26 MED ORDER — IOHEXOL 350 MG/ML SOLN
80.0000 mL | Freq: Once | INTRAVENOUS | Status: AC | PRN
  Administered 2021-05-26: 80 mL via INTRAVENOUS

## 2021-05-26 NOTE — Discharge Instructions (Addendum)
Your chest pain is likely from your elevated blood pressure.  Please take your BP meds as prescribed. Take your blood pressure at home and follow up with cardiology and primary care doctor   Please follow up with [facility] to establish care   You have an aneurysm that can be monitored with repeat CT in a year   Return to ER if you have worse chest pain, trouble breathing

## 2021-05-26 NOTE — ED Notes (Signed)
Patient transported to CT 

## 2021-05-26 NOTE — ED Notes (Signed)
Patient transported to X-ray 

## 2021-05-26 NOTE — ED Notes (Signed)
Patient and daughter requesting only '2mg'$  morphine to be given at this time. Darl Householder, MD made aware.

## 2021-05-26 NOTE — ED Provider Notes (Signed)
[facility] EMERGENCY DEPARTMENT Provider Note   CSN: [id number] Arrival date & time: 05/26/21  1730     History Chief Complaint  Patient presents with   Chest Pain    Shannon Roberts is a 70 y.o. female hx of COPD, HTN, here with chest pain, scapula pain.  Patient has scapular pain since yesterday.  She states it radiate to her chest.  She states that it is a pressure sensation.  Patient denies any history of CAD or stents.  Patient was noted to be hypertensive in the ED and took her blood pressure medicines prior to arrival.   The history is provided by the patient.      Past Medical History:  Diagnosis Date   Allergic rhinitis    Anemia    per hosp. - recently- pt. told that she is anemic   Anxiety    Colitis    COPD (chronic obstructive pulmonary disease) ([place])    no o2   Depression    Diverticulosis    Female bladder prolapse    Fibroids    GERD (gastroesophageal reflux disease)    Heart murmur    Hx of adenomatous colonic polyps 08/26/2019   Hyperlipemia    Hypertension    IBS (irritable bowel syndrome)    Insomnia    Lip abscess 04/05/2016   Mitral valve prolapse    Peripheral vascular disease ([place])    Pre-diabetes 08/03/2019   Diet controlled     Right bundle branch block    Sciatic nerve disease    Shortness of breath    Stroke [facility]) March 2015    Patient Active Problem List   Diagnosis Date Noted   Pre-diabetes 08/03/2019   Abnormal urine 08/03/2019   Cyst of ovary 08/03/2019   Endometriosis of the cul-de-sac 08/03/2019   Female bladder prolapse 08/03/2019   Pelvic mass 08/03/2019   Pneumatouria 08/03/2019   CKD (chronic kidney disease), stage III ([place]) 08/03/2019   Anemia 07/09/2019   Chronic constipation 07/09/2019   Chronic allergic rhinitis 07/30/2017   RBBB 01/31/2017   History of CVA (cerebrovascular accident) 01/31/2017   Anemia due to chronic kidney disease 01/31/2017   Occlusion and stenosis of carotid artery  without mention of cerebral infarction 02/08/2014   Carotid stenosis 01/17/2014   CVA (cerebral infarction) 01/03/2014   Stroke ([place]) 01/02/2014   Tobacco abuse 10/07/2011   HTN (hypertension), malignant 10/07/2011   Hyperlipidemia 10/07/2011   Anxiety disorder 10/07/2011   GERD (gastroesophageal reflux disease) 05/13/2011   Irritable bowel syndrome 05/13/2011   COPD, severe ([place]) 05/13/2011    Past Surgical History:  Procedure Laterality Date   CAROTID ENDARTERECTOMY Right January 17, 2014   CE   CHOLECYSTECTOMY     COLONOSCOPY     ENDARTERECTOMY Right 01/17/2014   Procedure: RIGHT CAROTID ARTERY ENDARTERECTOMY WITH DACRON PATCH ANGIOPLASTY;  Surgeon: Rosetta Posner, MD;  Location: [facility] OR;  Service: Vascular;  Laterality: Right;   UPPER GASTROINTESTINAL ENDOSCOPY       OB History   No obstetric history on file.     Family History  Problem Relation Age of Onset   Breast cancer Mother    Ovarian cancer Mother    AAA (abdominal aortic aneurysm) Mother    Emphysema Mother    Lung cancer Father    Emphysema Paternal Aunt    Pancreatic cancer Maternal Grandmother    Diabetes Maternal Aunt    Breast cancer Maternal Aunt    Heart disease  Paternal Grandfather    Diabetes Paternal Grandfather    Kidney disease Maternal Aunt    Colon cancer Paternal Aunt    Colon polyps Neg Hx    Esophageal cancer Neg Hx    Rectal cancer Neg Hx    Stomach cancer Neg Hx     Social History   Tobacco Use   Smoking status: Former    Packs/day: 1.50    Years: 47.00    Pack years: 70.50    Types: Cigarettes    Start date: 12/03/1966    Quit date: 01/02/2014    Years since quitting: 7.4   Smokeless tobacco: Never   Tobacco comments:    Stopped for 6 months once in 2007  Vaping Use   Vaping Use: Never used  Substance Use Topics   Alcohol use: No    Alcohol/week: 0.0 standard drinks   Drug use: No    Home Medications Prior to Admission medications   Medication Sig Start Date End Date  Taking? Authorizing Provider  acetaminophen (TYLENOL) 500 MG tablet Take 1,000 mg by mouth every 6 (six) hours as needed for mild pain, moderate pain, fever or headache.     [provider]  amLODipine (NORVASC) 10 MG tablet Take 5 mg by mouth daily.     [provider]  atenolol (TENORMIN) 100 MG tablet Take 1 tablet (100 mg total) by mouth daily. Patient taking differently: Take 50 mg by mouth daily. 02/01/17   Reyne Dumas, MD  Cholecalciferol (VITAMIN D3) 5000 units CAPS Take 5,000 Units by mouth every evening.     [provider]  clindamycin (CLEOCIN) 150 MG capsule Take 150 mg by mouth 3 (three) times daily.    [provider]  clopidogrel (PLAVIX) 75 MG tablet Take 75 mg by mouth daily.     [provider]  DULoxetine (CYMBALTA) 30 MG capsule Take 90 mg by mouth every evening.    [provider]  ferrous sulfate 325 (65 FE) MG tablet Take 325 mg by mouth daily with breakfast.    [provider]  fluticasone (FLONASE) 50 MCG/ACT nasal spray Place 2 sprays into both nostrils daily as needed for allergies or rhinitis.    [provider]  Ipratropium-Albuterol (COMBIVENT RESPIMAT) 20-100 MCG/ACT AERS respimat Inhale 1 puff into the lungs every 4 (four) hours as needed for wheezing or shortness of breath. 11/15/20   Magdalen Spatz, NP  ipratropium-albuterol (DUONEB) 0.5-2.5 (3) MG/3ML SOLN Take 3 mLs by nebulization every 6 (six) hours as needed (for wheezing/shortness of breath).    [provider]  isosorbide mononitrate (IMDUR) 30 MG 24 hr tablet Take 30 mg by mouth 2 (two) times daily. 09/24/14   [provider]  lisinopril (PRINIVIL,ZESTRIL) 40 MG tablet Take 40 mg by mouth daily.    [provider]  LORazepam (ATIVAN) 0.5 MG tablet Take 0.5 mg by mouth 2 (two) times daily as needed for anxiety.     [provider]  pantoprazole (PROTONIX) 40 MG tablet Take 40 mg by mouth daily.     [provider]  Probiotic Product (PROBIOTIC-10 PO) Take by mouth daily.    [provider]  simvastatin (ZOCOR) 10 MG tablet Take 1 tablet (10 mg total) by mouth at bedtime. 01/04/14   Rai, Vernelle Emerald, MD  traMADol (ULTRAM) 50 MG tablet Take 50 mg by mouth every 6 (six) hours as needed for moderate pain.     [provider]  umeclidinium-vilanterol [facility]  ELLIPTA) 62.5-25 MCG/INH AEPB Inhale 1 puff into the lungs daily. 09/22/19   Juanito Doom, MD  umeclidinium-vilanterol (ANORO ELLIPTA) 62.5-25 MCG/INH AEPB INHALE 1 PUFF BY MOUTH DAILY 05/10/21   Magdalen Spatz, NP    Allergies    Carvedilol, Buspirone, Dapsone, Niacin and related, Other, Septra [sulfamethoxazole-trimethoprim], Wellbutrin [bupropion], and Amoxicillin  Review of Systems   Review of Systems  Cardiovascular:  Positive for chest pain.  All other systems reviewed and are negative.  Physical Exam Updated Vital Signs BP (!) 143/82   Pulse 96   Temp 97.8 F (36.6 C) (Oral)   Resp (!) 26   SpO2 100%   Physical Exam Vitals and nursing note reviewed.  HENT:     Head: Normocephalic.  Eyes:     Extraocular Movements: Extraocular movements intact.     Pupils: Pupils are equal, round, and reactive to light.  Cardiovascular:     Rate and Rhythm: Normal rate and regular rhythm.     Heart sounds: Normal heart sounds.  Pulmonary:     Effort: Pulmonary effort is normal.     Breath sounds: Normal breath sounds.  Abdominal:     General: Bowel sounds are normal.     Palpations: Abdomen is soft.  Musculoskeletal:        General: Normal range of motion.     Cervical back: Normal range of motion and neck supple.  Skin:    General: Skin is warm.  Neurological:     General: No focal deficit present.     Mental Status: She is alert and oriented to person, place, and time.  Psychiatric:        Mood and Affect: Mood normal.        Behavior: Behavior normal.    ED Results / Procedures /  Treatments   Labs (all labs ordered are listed, but only abnormal results are displayed) Labs Reviewed  CBC - Abnormal; Notable for the following components:      Result Value   RBC 3.83 (*)    HCT 35.6 (*)    All other components within normal limits  I-STAT CHEM 8, ED - Abnormal; Notable for the following components:   Creatinine, Ser 1.10 (*)    Glucose, Bld 130 (*)    Hemoglobin 10.9 (*)    HCT 32.0 (*)    All other components within normal limits  HEPATIC FUNCTION PANEL  LIPASE, BLOOD  BASIC METABOLIC PANEL  TROPONIN I (HIGH SENSITIVITY)  TROPONIN I (HIGH SENSITIVITY)    EKG EKG Interpretation  Date/Time:  Saturday May 26 2021 17:40:42 EDT Ventricular Rate:  84 PR Interval:  189 QRS Duration: 144 QT Interval:  434 QTC Calculation: 514 R Axis:   -53 Text Interpretation: Sinus rhythm RBBB and LAFB Inferior infarct, acute Lateral leads are also involved No significant change since last tracing Confirmed by Wandra Arthurs [phone]) on 05/26/2021 6:01:50 PM  Radiology No results found.  Procedures Procedures   Medications Ordered in ED Medications  morphine 4 MG/ML injection 4 mg (2 mg Intravenous Given 05/26/21 1825)  hydrALAZINE (APRESOLINE) injection 5 mg (5 mg Intravenous Given 05/26/21 1825)  LORazepam (ATIVAN) injection 1 mg (1 mg Intravenous Given 05/26/21 1825)  iohexol (OMNIPAQUE) 350 MG/ML injection 80 mL (80 mLs Intravenous Contrast Given 05/26/21 1853)    ED Course  I have reviewed the triage vital signs and the nursing notes.  Pertinent labs & imaging results that were available during my care of the patient were  reviewed by me and considered in my medical decision making (see chart for details).    MDM Rules/Calculators/A&P                          Shannon Roberts is a 70 y.o. female here presenting with chest pain and back pain.  Patient is hypertensive.  Concern for possible dissection vs hypertensive urgency.  Also consider ACS as well.  We will get  labs and dissection study and troponin x2.  Will give BP meds as well   8:56 PM Labs unremarkable.  Troponin negative x2.  CTA did not show any dissection.  Patient does have 3 cm infrarenal aneurysm that can be follow-up outpatient.  I told her to keep a good BP log and talk to her PCP or cardiologist about it.  Patient does have some incidental occasional PVCs.  Stable for discharge      Final Clinical Impression(s) / ED Diagnoses Final diagnoses:  None    Rx / DC Orders ED Discharge Orders     None        Drenda Freeze, MD 05/26/21 2057

## 2021-05-26 NOTE — ED Triage Notes (Signed)
Pt BIB [facility] from [facility] for c/o central chest pressure and SHOB/DOE. Pt didn't want ASA due to "stomach issue" related to taking it. No nitro given in route. Pt denies any CP on arrival to the ED.

## 2021-05-31 NOTE — Progress Notes (Signed)
Cardiology Office Note:   Date:  06/01/2021  NAME:  Shannon Roberts    MRN: [id number] DOB:  19-Nov-1950   PCP:  Lawerance Cruel, MD  Cardiologist:  None  Electrophysiologist:  None   Referring MD: Lawerance Cruel, MD   Chief Complaint  Patient presents with   Chest Pain    History of Present Illness:   Shannon Roberts is a 70 y.o. female with a hx of COPD, AAA who is being seen today for the evaluation of chest pain at the request of Lawerance Cruel, MD. Seen in ER 05/26/2021 for chest pain. EKG without acute ischemic changes. Troponin negative. CTA without dissection. Mild coronary calcifications noted.  She was seen in the emergency room on 05/26/2021.  Apparently the day before around 4:30 AM she felt sharp pain in her shoulders.  She reports that this occurred while getting up from her sleep.  She reports the pain was sharp and radiated into her chest.  She also had shortness of breath and heart racing.  She reports that she does not feel well the rest of the day.  She went to the emergency room.  Troponins were negative.  EKG demonstrated PACs with aberrant conduction.  She was told she had PVCs.  She did not have PVCs.  I personally reviewed her EKG.  She reports that her blood pressure was elevated.  She was given medication for this.  PACs improved.  She reports has had no further episodes.  She was found to have a AAA up to 3 cm on CTA.  She also was found to have left renal artery stenosis that was described as moderate.  Her blood pressure is well controlled today.  Recent TSH is normal.  She has had a stroke in the past.  No history of heart disease.  An echocardiogram was obtained in 2015 that was nondiagnostic.  She had a stroke in 2015.  She is on Plavix since that time.  She apparently is aspirin intolerant.  She is undergone a right carotid endarterectomy.  This was in 2015.  She reports that she cannot exercise and not that active.  She reports her COPD and shortness of  breath limit her.  She reports heart disease in her paternal grandparents.  Her parents did not have heart attacks.  She has never had a heart attack.  She is a former smoker of nearly 40 years.  She is a retired Optometrist.  She is widowed.  She has 2 children.  She has several grandchildren.  Problem List COPD AAA -3 cm (CTA 05/26/2021) 3. Moderate L renal artery stenosis (05/26/2021) 4. CVA -R CEA 01/17/2014  5. Aspirin intolerant  6. HTN 7. HLD -Total cholesterol 158, HDL 40, LDL 90, triglycerides 134  Past Medical History: Past Medical History:  Diagnosis Date   Allergic rhinitis    Anemia    per hosp. - recently- pt. told that she is anemic   Anxiety    Colitis    COPD (chronic obstructive pulmonary disease) (HCC)    no o2   Depression    Diverticulosis    Female bladder prolapse    Fibroids    GERD (gastroesophageal reflux disease)    Heart murmur    Hx of adenomatous colonic polyps 08/26/2019   Hyperlipemia    Hypertension    IBS (irritable bowel syndrome)    Insomnia    Lip abscess 04/05/2016   Mitral valve prolapse    Peripheral  vascular disease ([place])    Pre-diabetes 08/03/2019   Diet controlled     Right bundle branch block    Sciatic nerve disease    Shortness of breath    Stroke [facility]) March 2015    Past Surgical History: Past Surgical History:  Procedure Laterality Date   CAROTID ENDARTERECTOMY Right January 17, 2014   CE   CHOLECYSTECTOMY     COLONOSCOPY     ENDARTERECTOMY Right 01/17/2014   Procedure: RIGHT CAROTID ARTERY ENDARTERECTOMY WITH DACRON PATCH ANGIOPLASTY;  Surgeon: Rosetta Posner, MD;  Location: [facility] OR;  Service: Vascular;  Laterality: Right;   UPPER GASTROINTESTINAL ENDOSCOPY      Current Medications: Current Meds  Medication Sig   acetaminophen (TYLENOL) 500 MG tablet Take 1,000 mg by mouth every 6 (six) hours as needed for mild pain, moderate pain, fever or headache.    amLODipine (NORVASC) 5 MG tablet Take 5 mg by mouth daily.    atenolol (TENORMIN) 50 MG tablet Take 50 mg by mouth daily.   atorvastatin (LIPITOR) 40 MG tablet Take 1 tablet (40 mg total) by mouth daily.   Cholecalciferol (VITAMIN D3) 5000 units CAPS Take 5,000 Units by mouth every evening.    clopidogrel (PLAVIX) 75 MG tablet Take 75 mg by mouth daily.    DULoxetine (CYMBALTA) 60 MG capsule Take 60 mg by mouth daily.   ferrous sulfate 325 (65 FE) MG tablet Take 325 mg by mouth daily with breakfast.   fluticasone (FLONASE) 50 MCG/ACT nasal spray Place 2 sprays into both nostrils daily as needed for allergies or rhinitis.   Ipratropium-Albuterol (COMBIVENT RESPIMAT) 20-100 MCG/ACT AERS respimat Inhale 1 puff into the lungs every 4 (four) hours as needed for wheezing or shortness of breath.   ipratropium-albuterol (DUONEB) 0.5-2.5 (3) MG/3ML SOLN Take 3 mLs by nebulization every 6 (six) hours as needed (for wheezing/shortness of breath).   isosorbide mononitrate (IMDUR) 30 MG 24 hr tablet Take 30 mg by mouth 2 (two) times daily.   lisinopril (PRINIVIL,ZESTRIL) 40 MG tablet Take 40 mg by mouth daily.   LORazepam (ATIVAN) 0.5 MG tablet Take 0.5 mg by mouth 2 (two) times daily as needed for anxiety.    metoprolol tartrate (LOPRESSOR) 100 MG tablet Take 1 tablet by mouth once for procedure.   pantoprazole (PROTONIX) 40 MG tablet Take 40 mg by mouth 2 (two) times daily.   Probiotic Product (PROBIOTIC-10 PO) Take by mouth daily.   traMADol (ULTRAM) 50 MG tablet Take 50 mg by mouth every 6 (six) hours as needed for moderate pain.    umeclidinium-vilanterol (ANORO ELLIPTA) 62.5-25 MCG/INH AEPB Inhale 1 puff into the lungs daily.   umeclidinium-vilanterol (ANORO ELLIPTA) 62.5-25 MCG/INH AEPB INHALE 1 PUFF BY MOUTH DAILY   [DISCONTINUED] simvastatin (ZOCOR) 10 MG tablet Take 1 tablet (10 mg total) by mouth at bedtime.     Allergies:    Carvedilol, Buspirone, Dapsone, Niacin and related, Other, Septra [sulfamethoxazole-trimethoprim], Wellbutrin [bupropion], and  Amoxicillin   Social History: Social History   Socioeconomic History   Marital status: Divorced    Spouse name: Not on file   Number of children: 2   Years of education: ASSOCIATES   Highest education level: Not on file  Occupational History   Occupation: Therapist, sports  Tobacco Use   Smoking status: Former    Packs/day: 1.50    Years: 47.00    Pack years: 70.50    Types: Cigarettes    Start date: 12/03/1966    Quit date: 01/02/2014  Years since quitting: 7.4   Smokeless tobacco: Never   Tobacco comments:    Stopped for 6 months once in 2007  Vaping Use   Vaping Use: Never used  Substance and Sexual Activity   Alcohol use: No    Alcohol/week: 0.0 standard drinks   Drug use: No   Sexual activity: Not Currently  Other Topics Concern   Not on file  Social History Narrative   Patient is divorced with 2 children.   Patient is right handed.   Patient has her Associates degree.   Patient drinks 3 cups daily.      [facility] (07/30/17):   Originally from [facility]. Has always lived in [place]. 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 Previously had cockatoo and a [place] for months. Did have mold in her current home that was remediated. Enjoys playing on the computer, reading, & babysitting.     Social Determinants of Health   Financial Resource Strain: Not on file  Food Insecurity: Not on file  Transportation Needs: Not on file  Physical Activity: Not on file  Stress: Not on file  Social Connections: Not on file     Family History: The patient's family history includes AAA (abdominal aortic aneurysm) in her mother; Breast cancer in her maternal aunt and mother; Colon cancer in her paternal aunt; Diabetes in her maternal aunt and paternal grandfather; Emphysema in her mother and paternal aunt; Heart disease in her paternal grandfather; Kidney disease in her maternal aunt; Lung cancer in her father; Ovarian cancer in her mother; Pancreatic cancer in her  maternal grandmother. There is no history of Colon polyps, Esophageal cancer, Rectal cancer, or Stomach cancer.  ROS:   All other ROS reviewed and negative. Pertinent positives noted in the HPI.     EKGs/Labs/Other Studies Reviewed:   The following studies were personally reviewed by me today:  EKG:  EKG is ordered today.  The ekg ordered today demonstrates normal sinus rhythm heart rate 72, PACs noted, and was personally reviewed by me.   Recent Labs: 05/26/2021: ALT 18; BUN 19; Creatinine, Ser 1.10; Hemoglobin 10.9; Platelets 241; Potassium 4.0; Sodium 137   Recent Lipid Panel    Component Value Date/Time   CHOL 158 01/02/2014 2255   TRIG 99 01/02/2014 2255   HDL 47 01/02/2014 2255   CHOLHDL 3.4 01/02/2014 2255   VLDL 20 01/02/2014 2255   LDLCALC 91 01/02/2014 2255    Physical Exam:   VS:  BP 126/78   Pulse 72   Ht '5\' 4"'$  (1.626 m)   Wt 194 lb (88 kg)   SpO2 99%   BMI 33.30 kg/m    Wt Readings from Last 3 Encounters:  06/01/21 194 lb (88 kg)  08/11/19 191 lb 6.4 oz (86.8 kg)  08/03/19 191 lb 6.4 oz (86.8 kg)    General: Well nourished, well developed, in no acute distress Head: Atraumatic, normal size  Eyes: PEERLA, EOMI  Neck: Supple, no JVD Endocrine: No thryomegaly Cardiac: Normal S1, S2; RRR; no murmurs, rubs, or gallops Lungs: Clear to auscultation bilaterally, no wheezing, rhonchi or rales  Abd: Soft, nontender, no hepatomegaly  Ext: No edema, pulses 2+ Musculoskeletal: No deformities, BUE and BLE strength normal and equal Skin: Warm and dry, no rashes   Neuro: Alert and oriented to person, place, time, and situation, CNII-XII grossly intact, no focal deficits  Psych: Normal mood and affect   ASSESSMENT:   PHEONA MONTREUIL is a 70 y.o. female  who presents for the following: 1. Chest pain, unspecified type   2. Coronary artery calcification seen on computed tomography   3. AAA (abdominal aortic aneurysm) without rupture ([place])   4. Renal artery stenosis  ([place])   5. Mixed hyperlipidemia   6. PAC (premature atrial contraction)   7. Primary hypertension     PLAN:   1. Chest pain, unspecified type 2. Coronary artery calcification seen on computed tomography -Evaluated in emergency room for back pain described as sharp in the shoulder blades and radiating to the chest.  Symptoms have resolved.  EKG in office demonstrates sinus rhythm with PACs aberrantly conducted QRS complexes.  I did review her EKG from the emergency room.  Please or not PVCs.  She was having PACs.  It appears she may also just be having intermittent aberrant complexes.   -EKG in office without ischemic changes.  Chest pain and symptoms are atypical.  Cardiovascular examination is normal.  CVD risk factors include COPD, known coronary calcifications as well as family history of heart disease.  She had mild coronary calcifications on her CT scan.  For further evaluation we will proceed with coronary CTA.  She takes atenolol but will take 100 mg of metoprolol 2 hours before the scan.  Recent kidney profile shows kidney function is normal.  We also obtain an echocardiogram.  She has significant vascular disease and an overall cardiovascular assessment.  3. AAA (abdominal aortic aneurysm) without rupture (HCC) -3 cm on CTA.  We will plan to repeat a ultrasound in 1 year.  4. Renal artery stenosis (HCC) -BP well controlled.  Described as left moderate renal artery stenosis.  Symptoms we will need to keep an eye on her blood pressure becomes difficult to control.  This is not bilateral.  5. Mixed hyperlipidemia -She had a stroke.  She is status post carotid neurectomy.  She has a AAA.  She has renal artery stenosis as well as coronary calcifications.  She needs intensification of lipid-lowering agents.  We will stop her simvastatin.  Start Lipitor 40 mg daily.  We will recheck this in 6 months.  6. PAC (premature atrial contraction) -PACs with aberrantly conducted QRS complexes.  No  PVCs.  Continue atenolol.  She reports no further symptoms from this.  7. Primary hypertension -Known history of hypertension.  She is on amlodipine 5 mg daily, atenolol 50 mg daily, lisinopril 20 mg daily, Imdur 30 mg twice daily.  Her blood pressure is well controlled.  She does have left renal artery stenosis that is described as moderate.  Is not bilateral.  She will keep a close eye on her blood pressure.  I suspect this could explain the symptoms she had in the emergency room.  She reports her blood pressure is elevated.  Seems to be well controlled today.  Disposition: Return in about 6 months (around 12/02/2021).  Medication Adjustments/Labs and Tests Ordered: Current medicines are reviewed at length with the patient today.  Concerns regarding medicines are outlined above.  Orders Placed This Encounter  Procedures   CT CORONARY MORPH W/CTA COR W/SCORE W/CA W/CM &/OR WO/CM   EKG 12-Lead   ECHOCARDIOGRAM COMPLETE    Meds ordered this encounter  Medications   metoprolol tartrate (LOPRESSOR) 100 MG tablet    Sig: Take 1 tablet by mouth once for procedure.    Dispense:  1 tablet    Refill:  0   atorvastatin (LIPITOR) 40 MG tablet    Sig: Take 1 tablet (40 mg  total) by mouth daily.    Dispense:  90 tablet    Refill:  3     Patient Instructions  Medication Instructions:  Take Metoprolol Tartrate 100 mg two hours before the CT scan when scheduled.  STOP SIMVASTATIN  START LIPITOR 40 MG DAILY   *If you need a refill on your cardiac medications before your next appointment, please call your pharmacy*  Testing/Procedures: Echocardiogram - Your physician has requested that you have an echocardiogram. Echocardiography is a painless test that uses sound waves to create images of your heart. It provides your doctor with information about the size and shape of your heart and how well your heart's chambers and valves are working. This procedure takes approximately one hour. There are no  restrictions for this procedure. This will be performed at our [facility] location - [address].  Your physician has requested that you have cardiac CT. Cardiac computed tomography (CT) is a painless test that uses an x-ray machine to take clear, detailed pictures of your heart. For further information please visit HugeFiesta.[email or web address]. Please follow instruction sheet as given.   Follow-Up: At [facility], you and your health needs are our priority.  As part of our continuing mission to provide you with exceptional heart care, we have created designated Provider Care Teams.  These Care Teams include your primary Cardiologist (physician) and Advanced Practice Providers (APPs -  Physician Assistants and Nurse Practitioners) who all work together to provide you with the care you need, when you need it.  We recommend signing up for the patient portal called "MyChart".  Sign up information is provided on this After Visit Summary.  MyChart is used to connect with patients for Virtual Visits (Telemedicine).  Patients are able to view lab/test results, encounter notes, upcoming appointments, etc.  Non-urgent messages can be sent to your provider as well.   To learn more about what you can do with MyChart, go to [email or web address].    Your next appointment:   6 month(s) (come back fasting- nothing to eat or drink to this appointment)  The format for your next appointment:   In Person  Provider:   Eleonore Chiquito, MD   Other Instructions   Your cardiac CT will be scheduled at one of the below locations:   [facility] [address] [place], [place] [address] [phone]  If scheduled at [facility], please arrive at the [facility] main entrance (entrance A) of [facility] 30 minutes prior to test start time. Proceed to the [facility] Radiology Department (first floor) to check-in and test prep.   Please follow these instructions carefully  (unless otherwise directed):   On the Night Before the Test: Be sure to Drink plenty of water. Do not consume any caffeinated/decaffeinated beverages or chocolate 12 hours prior to your test. Do not take any antihistamines 12 hours prior to your test.  On the Day of the Test: Drink plenty of water until 1 hour prior to the test. Do not eat any food 4 hours prior to the test. You may take your regular medications prior to the test.  Take metoprolol (Lopressor) two hours prior to test. HOLD Furosemide/Hydrochlorothiazide morning of the test. FEMALES- please wear underwire-free bra if available, avoid dresses & tight clothing  After the Test: Drink plenty of water. After receiving IV contrast, you may experience a mild flushed feeling. This is normal. On occasion, you may experience a mild rash up to  24 hours after the test. This is not dangerous. If this occurs, you can take Benadryl 25 mg and increase your fluid intake. If you experience trouble breathing, this can be serious. If it is severe call 911 IMMEDIATELY. If it is mild, please call our office. If you take any of these medications: Glipizide/Metformin, Avandament, Glucavance, please do not take 48 hours after completing test unless otherwise instructed.  Please allow 2-4 weeks for scheduling of routine cardiac CTs. Some insurance companies require a pre-authorization which may delay scheduling of this test.   For non-scheduling related questions, please contact the cardiac imaging nurse navigator should you have any questions/concerns: Marchia Bond, Cardiac Imaging Nurse Navigator Gordy Clement, Cardiac Imaging Nurse Navigator [facility] Direct Office Dial: [phone]   For scheduling needs, including cancellations and rescheduling, please call [place], [phone].    Signed, Addison Naegeli. Audie Box, MD, [place]  [address] [place], [place] [address] [phone]  06/01/2021 8:56 AM

## 2021-06-01 ENCOUNTER — Other Ambulatory Visit: Payer: Self-pay

## 2021-06-01 ENCOUNTER — Encounter: Payer: Self-pay | Admitting: Cardiovascular Disease

## 2021-06-01 ENCOUNTER — Ambulatory Visit: Payer: Medicare Other | Admitting: Cardiovascular Disease

## 2021-06-01 VITALS — BP 126/78 | HR 72 | Ht 64.0 in | Wt 194.0 lb

## 2021-06-01 DIAGNOSIS — I701 Atherosclerosis of renal artery: Secondary | ICD-10-CM

## 2021-06-01 DIAGNOSIS — I2510 Atherosclerotic heart disease of native coronary artery without angina pectoris: Secondary | ICD-10-CM

## 2021-06-01 DIAGNOSIS — I10 Essential (primary) hypertension: Secondary | ICD-10-CM

## 2021-06-01 DIAGNOSIS — R079 Chest pain, unspecified: Secondary | ICD-10-CM | POA: Diagnosis not present

## 2021-06-01 DIAGNOSIS — I714 Abdominal aortic aneurysm, without rupture, unspecified: Secondary | ICD-10-CM

## 2021-06-01 DIAGNOSIS — E782 Mixed hyperlipidemia: Secondary | ICD-10-CM

## 2021-06-01 DIAGNOSIS — I491 Atrial premature depolarization: Secondary | ICD-10-CM

## 2021-06-01 MED ORDER — ATORVASTATIN CALCIUM 40 MG PO TABS: 40.0000 mg | ORAL_TABLET | Freq: Every day | ORAL | 3 refills | Status: DC

## 2021-06-01 MED ORDER — METOPROLOL TARTRATE 100 MG PO TABS: ORAL_TABLET | ORAL | 0 refills | Status: DC

## 2021-06-01 NOTE — Patient Instructions (Addendum)
Medication Instructions:  Take Metoprolol Tartrate 100 mg two hours before the CT scan when scheduled.  STOP SIMVASTATIN  START LIPITOR 40 MG DAILY   *If you need a refill on your cardiac medications before your next appointment, please call your pharmacy*  Testing/Procedures: Echocardiogram - Your physician has requested that you have an echocardiogram. Echocardiography is a painless test that uses sound waves to create images of your heart. It provides your doctor with information about the size and shape of your heart and how well your heart's chambers and valves are working. This procedure takes approximately one hour. There are no restrictions for this procedure. This will be performed at our [facility] location - [address], [address].  Your physician has requested that you have cardiac CT. Cardiac computed tomography (CT) is a painless test that uses an x-ray machine to take clear, detailed pictures of your heart. For further information please visit [email or web address].[email or web address]. Please follow instruction sheet as given.   Follow-Up: At [facility], you and your health needs are our priority.  As part of our continuing mission to provide you with exceptional heart care, we have created designated Provider Care Teams.  These Care Teams include your primary Cardiologist (physician) and Advanced Practice Providers (APPs -  Physician Assistants and Nurse Practitioners) who all work together to provide you with the care you need, when you need it.  We recommend signing up for the patient portal called "MyChart".  Sign up information is provided on this After Visit Summary.  MyChart is used to connect with patients for Virtual Visits (Telemedicine).  Patients are able to view lab/test results, encounter notes, upcoming appointments, etc.  Non-urgent messages can be sent to your provider as well.   To learn more about what you can do with MyChart, go to [email or web address].    Your next  appointment:   6 month(s) (come back fasting- nothing to eat or drink to this appointment)  The format for your next appointment:   In Person  Provider:   Eleonore Chiquito, MD   Other Instructions   Your cardiac CT will be scheduled at one of the below locations:   [facility] [address] [place], [place] [address] [phone]  If scheduled at [facility], please arrive at the [facility] main entrance (entrance A) of [facility] 30 minutes prior to test start time. Proceed to the [facility] Radiology Department (first floor) to check-in and test prep.   Please follow these instructions carefully (unless otherwise directed):   On the Night Before the Test: Be sure to Drink plenty of water. Do not consume any caffeinated/decaffeinated beverages or chocolate 12 hours prior to your test. Do not take any antihistamines 12 hours prior to your test.  On the Day of the Test: Drink plenty of water until 1 hour prior to the test. Do not eat any food 4 hours prior to the test. You may take your regular medications prior to the test.  Take metoprolol (Lopressor) two hours prior to test. HOLD Furosemide/Hydrochlorothiazide morning of the test. FEMALES- please wear underwire-free bra if available, avoid dresses & tight clothing  After the Test: Drink plenty of water. After receiving IV contrast, you may experience a mild flushed feeling. This is normal. On occasion, you may experience a mild rash up to 24 hours after the test. This is not dangerous. If this occurs, you can take Benadryl 25 mg and increase your fluid intake. If you  experience trouble breathing, this can be serious. If it is severe call 911 IMMEDIATELY. If it is mild, please call our office. If you take any of these medications: Glipizide/Metformin, Avandament, Glucavance, please do not take 48 hours after completing test unless otherwise instructed.  Please allow 2-4 weeks for  scheduling of routine cardiac CTs. Some insurance companies require a pre-authorization which may delay scheduling of this test.   For non-scheduling related questions, please contact the cardiac imaging nurse navigator should you have any questions/concerns: Marchia Bond, Cardiac Imaging Nurse Navigator Gordy Clement, Cardiac Imaging Nurse Navigator [place] Direct Office Dial: [phone]   For scheduling needs, including cancellations and rescheduling, please call [place], [phone].

## 2021-06-04 ENCOUNTER — Telehealth: Payer: Self-pay | Admitting: Cardiovascular Disease

## 2021-06-04 NOTE — Telephone Encounter (Signed)
Called patient, advised that I spoke with Dr.O'Neal and he stated it was okay to take the Atenolol this am, and the Metoprolol two hours before, and this was just a one time dose for the scan.  Patient felt better after speaking with her. She had no other questions.

## 2021-06-04 NOTE — Telephone Encounter (Signed)
Pt c/o medication issue:  1. Name of Medication:  metoprolol tartrate (LOPRESSOR) 100 MG tablet atenolol (TENORMIN) 50 MG tablet  2. How are you currently taking this medication (dosage and times per day)? Metoprolol has not started, atenolol 1 tablet daily  3. Are you having a reaction (difficulty breathing--STAT)? no  4. What is your medication issue? Patient states the instructions for her tests on her after visit summary state to take the metoprolol 2 hours prior, but she is already on atenolol. She also states the reaction between the medications is one that she is allergic to. She states the instructions also say to hold a dieretic but she is not on a dieretic. She states the medication list still has a probiotic, but she is not on a probiotic anymore.

## 2021-06-04 NOTE — Telephone Encounter (Signed)
Attempted to call patient, left message for patient to call back to office.   

## 2021-06-04 NOTE — Telephone Encounter (Signed)
Spoke to patient stated she is scheduled for a coronary ct next week.Stated she wanted to ask Dr.O'Neal if she needs to take Metoprolol since she already takes Atenolol 50 mg daily.Stated pharmacist told her since she is allergic to Carvedilol she would not be able to take Metoprolol.Message sent to Dr.O'Neal for advice.

## 2021-06-04 NOTE — Telephone Encounter (Signed)
Pt is returning phone call please advise 

## 2021-06-04 NOTE — Telephone Encounter (Signed)
Returned call to patient left message on personal voice mail to call back. 

## 2021-06-11 ENCOUNTER — Telehealth: Payer: Self-pay | Admitting: Cardiovascular Disease

## 2021-06-11 ENCOUNTER — Telehealth (HOSPITAL_COMMUNITY): Payer: Self-pay | Admitting: *Deleted

## 2021-06-11 NOTE — Telephone Encounter (Signed)
  Pt's daughter is returning call

## 2021-06-11 NOTE — Telephone Encounter (Signed)
Returning patients's daughter's call regarding upcoming cardiac imaging study; pt verbalizes understanding of appt date/time, parking situation and where to check in, pre-test NPO status and medications ordered; name and call back number provided for further questions should they arise  Gordy Clement RN Navigator Cardiac Imaging [facility] [phone] office [phone] cell  Patient to hold her daily atenolol and take '100mg'$  metoprolol tartrate two hours prior to cardiac CT.  Patient will also take her ativan one hour prior to cardiac CT and pt's daughter will drive her to appointment.

## 2021-06-11 NOTE — Telephone Encounter (Signed)
Pt contacted by CT nurse navigator.  Questions answered.

## 2021-06-11 NOTE — Telephone Encounter (Signed)
New Message:     Daughter would like to talk to the nurse please. Patient is having a procedure tomorrow and she have some questions.

## 2021-06-11 NOTE — Telephone Encounter (Signed)
Left message to call back  

## 2021-06-11 NOTE — Telephone Encounter (Signed)
Attempted to call patient regarding upcoming cardiac CT appointment. °Left message on voicemail with name and callback number ° °Jerid Catherman RN Navigator Cardiac Imaging °[facility] °[phone] Office °[phone] Cell ° °

## 2021-06-12 ENCOUNTER — Other Ambulatory Visit: Payer: Self-pay | Admitting: Cardiology

## 2021-06-12 ENCOUNTER — Ambulatory Visit (HOSPITAL_COMMUNITY)
Admission: RE | Admit: 2021-06-12 | Discharge: 2021-06-12 | Disposition: A | Discharge status: Home / Self Care | Payer: Medicare Other | Source: Ambulatory Visit | Attending: Cardiovascular Disease | Admitting: Cardiovascular Disease

## 2021-06-12 ENCOUNTER — Other Ambulatory Visit: Payer: Self-pay

## 2021-06-12 ENCOUNTER — Ambulatory Visit (HOSPITAL_COMMUNITY): Admission: RE | Admit: 2021-06-12 | Payer: Medicare Other | Source: Ambulatory Visit

## 2021-06-12 ENCOUNTER — Ambulatory Visit (HOSPITAL_COMMUNITY)
Admission: RE | Admit: 2021-06-12 | Discharge: 2021-06-12 | Disposition: A | Discharge status: Home / Self Care | Payer: Medicare Other | Source: Ambulatory Visit | Attending: Cardiology | Admitting: Cardiology

## 2021-06-12 DIAGNOSIS — R079 Chest pain, unspecified: Secondary | ICD-10-CM | POA: Diagnosis not present

## 2021-06-12 DIAGNOSIS — J439 Emphysema, unspecified: Secondary | ICD-10-CM | POA: Diagnosis not present

## 2021-06-12 DIAGNOSIS — I700 Atherosclerosis of aorta: Secondary | ICD-10-CM | POA: Diagnosis not present

## 2021-06-12 DIAGNOSIS — I2510 Atherosclerotic heart disease of native coronary artery without angina pectoris: Secondary | ICD-10-CM | POA: Diagnosis not present

## 2021-06-12 DIAGNOSIS — R072 Precordial pain: Secondary | ICD-10-CM | POA: Diagnosis not present

## 2021-06-12 DIAGNOSIS — R931 Abnormal findings on diagnostic imaging of heart and coronary circulation: Secondary | ICD-10-CM | POA: Diagnosis not present

## 2021-06-12 MED ORDER — NITROGLYCERIN 0.4 MG SL SUBL: 0.8000 mg | SUBLINGUAL_TABLET | Freq: Once | SUBLINGUAL | Status: AC

## 2021-06-12 MED ORDER — METOPROLOL TARTRATE 5 MG/5ML IV SOLN
10.0000 mg | INTRAVENOUS | Status: AC | PRN
  Administered 2021-06-12: 10 mg via INTRAVENOUS

## 2021-06-12 MED ORDER — IOHEXOL 350 MG/ML SOLN
95.0000 mL | Freq: Once | INTRAVENOUS | Status: AC | PRN
  Administered 2021-06-12: 95 mL via INTRAVENOUS

## 2021-06-12 MED ORDER — NITROGLYCERIN 0.4 MG SL SUBL
SUBLINGUAL_TABLET | SUBLINGUAL | Status: AC
  Administered 2021-06-12: 0.8 mg via SUBLINGUAL
  Filled 2021-06-12: qty 2

## 2021-06-12 MED ORDER — METOPROLOL TARTRATE 5 MG/5ML IV SOLN
INTRAVENOUS | Status: AC
  Administered 2021-06-12: 10 mg via INTRAVENOUS
  Filled 2021-06-12: qty 20

## 2021-06-21 ENCOUNTER — Other Ambulatory Visit: Payer: Self-pay

## 2021-06-21 ENCOUNTER — Ambulatory Visit (HOSPITAL_COMMUNITY): Payer: Medicare Other | Attending: Cardiology

## 2021-06-21 DIAGNOSIS — R079 Chest pain, unspecified: Secondary | ICD-10-CM | POA: Diagnosis not present

## 2021-06-21 DIAGNOSIS — I6529 Occlusion and stenosis of unspecified carotid artery: Secondary | ICD-10-CM

## 2021-06-21 LAB — ECHOCARDIOGRAM COMPLETE
Area-P 1/2: 2.75 cm2
S' Lateral: 3.2 cm

## 2021-06-22 NOTE — Telephone Encounter (Signed)
Sent message back to patient.

## 2021-06-26 ENCOUNTER — Encounter (HOSPITAL_COMMUNITY): Payer: Self-pay

## 2021-06-26 ENCOUNTER — Other Ambulatory Visit: Payer: Self-pay

## 2021-06-26 ENCOUNTER — Emergency Department (HOSPITAL_COMMUNITY)
Admission: EM | Admit: 2021-06-26 | Discharge: 2021-06-26 | Disposition: A | Discharge status: Home / Self Care | Payer: Medicare Other | Attending: Emergency Medicine | Admitting: Emergency Medicine

## 2021-06-26 ENCOUNTER — Emergency Department (HOSPITAL_COMMUNITY): Payer: Medicare Other

## 2021-06-26 DIAGNOSIS — Q433 Congenital malformations of intestinal fixation: Secondary | ICD-10-CM | POA: Diagnosis not present

## 2021-06-26 DIAGNOSIS — K219 Gastro-esophageal reflux disease without esophagitis: Secondary | ICD-10-CM | POA: Diagnosis not present

## 2021-06-26 DIAGNOSIS — Z7951 Long term (current) use of inhaled steroids: Secondary | ICD-10-CM | POA: Insufficient documentation

## 2021-06-26 DIAGNOSIS — Z7902 Long term (current) use of antithrombotics/antiplatelets: Secondary | ICD-10-CM | POA: Diagnosis not present

## 2021-06-26 DIAGNOSIS — Z79899 Other long term (current) drug therapy: Secondary | ICD-10-CM | POA: Diagnosis not present

## 2021-06-26 DIAGNOSIS — Q600 Renal agenesis, unilateral: Secondary | ICD-10-CM | POA: Diagnosis not present

## 2021-06-26 DIAGNOSIS — R1084 Generalized abdominal pain: Secondary | ICD-10-CM | POA: Diagnosis not present

## 2021-06-26 DIAGNOSIS — I129 Hypertensive chronic kidney disease with stage 1 through stage 4 chronic kidney disease, or unspecified chronic kidney disease: Secondary | ICD-10-CM | POA: Diagnosis not present

## 2021-06-26 DIAGNOSIS — M5137 Other intervertebral disc degeneration, lumbosacral region: Secondary | ICD-10-CM | POA: Diagnosis not present

## 2021-06-26 DIAGNOSIS — N183 Chronic kidney disease, stage 3 unspecified: Secondary | ICD-10-CM | POA: Diagnosis not present

## 2021-06-26 DIAGNOSIS — R0789 Other chest pain: Secondary | ICD-10-CM | POA: Insufficient documentation

## 2021-06-26 DIAGNOSIS — E1122 Type 2 diabetes mellitus with diabetic chronic kidney disease: Secondary | ICD-10-CM | POA: Insufficient documentation

## 2021-06-26 DIAGNOSIS — Z87891 Personal history of nicotine dependence: Secondary | ICD-10-CM | POA: Diagnosis not present

## 2021-06-26 DIAGNOSIS — D631 Anemia in chronic kidney disease: Secondary | ICD-10-CM | POA: Insufficient documentation

## 2021-06-26 DIAGNOSIS — M545 Low back pain, unspecified: Secondary | ICD-10-CM | POA: Insufficient documentation

## 2021-06-26 DIAGNOSIS — I77811 Abdominal aortic ectasia: Secondary | ICD-10-CM | POA: Diagnosis not present

## 2021-06-26 DIAGNOSIS — J449 Chronic obstructive pulmonary disease, unspecified: Secondary | ICD-10-CM | POA: Insufficient documentation

## 2021-06-26 LAB — COMPREHENSIVE METABOLIC PANEL
ALT: 187 U/L — ABNORMAL HIGH (ref 0–44)
AST: 168 U/L — ABNORMAL HIGH (ref 15–41)
Albumin: 4.1 g/dL (ref 3.5–5.0)
Alkaline Phosphatase: 142 U/L — ABNORMAL HIGH (ref 38–126)
Anion gap: 10 (ref 5–15)
BUN: 22 mg/dL (ref 8–23)
CO2: 22 mmol/L (ref 22–32)
Calcium: 10.1 mg/dL (ref 8.9–10.3)
Chloride: 102 mmol/L (ref 98–111)
Creatinine, Ser: 1.56 mg/dL — ABNORMAL HIGH (ref 0.44–1.00)
GFR, Estimated: 35 mL/min — ABNORMAL LOW (ref >60)
Glucose, Bld: 152 mg/dL — ABNORMAL HIGH (ref 70–99)
Potassium: 4.7 mmol/L (ref 3.5–5.1)
Sodium: 134 mmol/L — ABNORMAL LOW (ref 135–145)
Total Bilirubin: 1.7 mg/dL — ABNORMAL HIGH (ref 0.3–1.2)
Total Protein: 8.1 g/dL (ref 6.5–8.1)

## 2021-06-26 LAB — LACTIC ACID, PLASMA: Lactic Acid, Venous: 1.5 mmol/L (ref 0.5–1.9)

## 2021-06-26 LAB — CBC WITH DIFFERENTIAL/PLATELET
Abs Immature Granulocytes: 0.06 10*3/uL (ref 0.00–0.07)
Basophils Absolute: 0 10*3/uL (ref 0.0–0.1)
Basophils Relative: 1 %
Eosinophils Absolute: 0.3 10*3/uL (ref 0.0–0.5)
Eosinophils Relative: 4 %
HCT: 32.3 % — ABNORMAL LOW (ref 36.0–46.0)
Hemoglobin: 11 g/dL — ABNORMAL LOW (ref 12.0–15.0)
Immature Granulocytes: 1 %
Lymphocytes Relative: 4 %
Lymphs Abs: 0.4 10*3/uL — ABNORMAL LOW (ref 0.7–4.0)
MCH: 31.2 pg (ref 26.0–34.0)
MCHC: 34.1 g/dL (ref 30.0–36.0)
MCV: 91.5 fL (ref 80.0–100.0)
Monocytes Absolute: 0.6 10*3/uL (ref 0.1–1.0)
Monocytes Relative: 7 %
Neutro Abs: 7.5 10*3/uL (ref 1.7–7.7)
Neutrophils Relative %: 83 %
Platelets: 200 10*3/uL (ref 150–400)
RBC: 3.53 MIL/uL — ABNORMAL LOW (ref 3.87–5.11)
RDW: 13 % (ref 11.5–15.5)
WBC: 8.9 10*3/uL (ref 4.0–10.5)
nRBC: 0 % (ref 0.0–0.2)

## 2021-06-26 LAB — HEPATITIS PANEL, ACUTE
HCV Ab: NONREACTIVE
Hep A IgM: NONREACTIVE
Hep B C IgM: NONREACTIVE
Hepatitis B Surface Ag: NONREACTIVE

## 2021-06-26 LAB — LIPASE, BLOOD: Lipase: 37 U/L (ref 11–51)

## 2021-06-26 MED ORDER — IOHEXOL 350 MG/ML SOLN
75.0000 mL | Freq: Once | INTRAVENOUS | Status: AC | PRN
  Administered 2021-06-26: 60 mL via INTRAVENOUS

## 2021-06-26 MED ORDER — FENTANYL CITRATE PF 50 MCG/ML IJ SOSY
12.5000 ug | PREFILLED_SYRINGE | Freq: Once | INTRAMUSCULAR | Status: AC
  Administered 2021-06-26: 12.5 ug via INTRAVENOUS
  Filled 2021-06-26: qty 1

## 2021-06-26 MED ORDER — SODIUM CHLORIDE 0.9 % IV BOLUS
500.0000 mL | Freq: Once | INTRAVENOUS | Status: AC
  Administered 2021-06-26: 500 mL via INTRAVENOUS

## 2021-06-26 MED ORDER — SIMVASTATIN 20 MG PO TABS: 20.0000 mg | ORAL_TABLET | Freq: Every day | ORAL | 0 refills | Status: DC

## 2021-06-26 MED ORDER — ATORVASTATIN CALCIUM 10 MG PO TABS: 10.0000 mg | ORAL_TABLET | Freq: Every day | ORAL | 0 refills | Status: DC

## 2021-06-26 MED ORDER — ONDANSETRON HCL 4 MG PO TABS: 4.0000 mg | ORAL_TABLET | Freq: Three times a day (TID) | ORAL | 0 refills | Status: DC | PRN

## 2021-06-26 NOTE — ED Triage Notes (Signed)
PT BIBA from home.  Per EMS pt c/o  abdominal pain x3 days amd SOB that began lastnight. Pt reports having a back pain starting Sunday night which then radiated to her abdomen. Hx of stroke, AAA.   BP 160/100 HR 96 RR 18 97% room air.  CBG 145

## 2021-06-26 NOTE — Discharge Instructions (Signed)
As discussed, today's evaluation has been generally reassuring.  There is no evidence that your abdominal aortic aneurysm has enlarged.  There is evidence that your liver function has diminished, as reflected by your abnormal labs.  It is important you follow-up with your physician, your gastroenterologist, and your cardiologist to discuss your medication regimen and today's findings.  Return here for concerning changes in your condition.

## 2021-06-26 NOTE — ED Provider Notes (Signed)
[facility] DEPT Provider Note   CSN: [id number] Arrival date & time: 06/26/21  [id number]     History No chief complaint on file.   Shannon Roberts is a 70 y.o. female.  HPI Patient with multiple medical issues including recently diagnosed infrarenal abdominal aortic aneurysm presents with back and abdominal pain.  She was diagnosed with her aneurysm about 1 month ago, scheduled for outpatient follow-up.  With past 3 days she has developed pain in her lower back, radiating to the lower abdomen.  Pain is better after a bowel movement, minimally improved with OTC medication.  No nausea, vomiting, fever, chills, dyspnea. Pain is sore, moderate, 7/10    Past Medical History:  Diagnosis Date   Allergic rhinitis    Anemia    per hosp. - recently- pt. told that she is anemic   Anxiety    Colitis    COPD (chronic obstructive pulmonary disease) ([place])    no o2   Depression    Diverticulosis    Female bladder prolapse    Fibroids    GERD (gastroesophageal reflux disease)    Heart murmur    Hx of adenomatous colonic polyps 08/26/2019   Hyperlipemia    Hypertension    IBS (irritable bowel syndrome)    Insomnia    Lip abscess 04/05/2016   Mitral valve prolapse    Peripheral vascular disease ([place])    Pre-diabetes 08/03/2019   Diet controlled     Right bundle branch block    Sciatic nerve disease    Shortness of breath    Stroke [facility]) March 2015    Patient Active Problem List   Diagnosis Date Noted   Pre-diabetes 08/03/2019   Abnormal urine 08/03/2019   Cyst of ovary 08/03/2019   Endometriosis of the cul-de-sac 08/03/2019   Female bladder prolapse 08/03/2019   Pelvic mass 08/03/2019   Pneumatouria 08/03/2019   CKD (chronic kidney disease), stage III ([place]) 08/03/2019   Anemia 07/09/2019   Chronic constipation 07/09/2019   Chronic allergic rhinitis 07/30/2017   RBBB 01/31/2017   History of CVA (cerebrovascular accident) 01/31/2017   Anemia due to  chronic kidney disease 01/31/2017   Occlusion and stenosis of carotid artery without mention of cerebral infarction 02/08/2014   Carotid stenosis 01/17/2014   CVA (cerebral infarction) 01/03/2014   Stroke ([place]) 01/02/2014   Tobacco abuse 10/07/2011   HTN (hypertension), malignant 10/07/2011   Hyperlipidemia 10/07/2011   Anxiety disorder 10/07/2011   GERD (gastroesophageal reflux disease) 05/13/2011   Irritable bowel syndrome 05/13/2011   COPD, severe ([place]) 05/13/2011    Past Surgical History:  Procedure Laterality Date   CAROTID ENDARTERECTOMY Right January 17, 2014   CE   CHOLECYSTECTOMY     COLONOSCOPY     ENDARTERECTOMY Right 01/17/2014   Procedure: RIGHT CAROTID ARTERY ENDARTERECTOMY WITH DACRON PATCH ANGIOPLASTY;  Surgeon: Rosetta Posner, MD;  Location: [facility] OR;  Service: Vascular;  Laterality: Right;   UPPER GASTROINTESTINAL ENDOSCOPY       OB History   No obstetric history on file.     Family History  Problem Relation Age of Onset   Breast cancer Mother    Ovarian cancer Mother    AAA (abdominal aortic aneurysm) Mother    Emphysema Mother    Lung cancer Father    Emphysema Paternal Aunt    Pancreatic cancer Maternal Grandmother    Diabetes Maternal Aunt    Breast cancer Maternal Aunt    Heart disease Paternal Grandfather  Diabetes Paternal Grandfather    Kidney disease Maternal Aunt    Colon cancer Paternal Aunt    Colon polyps Neg Hx    Esophageal cancer Neg Hx    Rectal cancer Neg Hx    Stomach cancer Neg Hx     Social History   Tobacco Use   Smoking status: Former    Packs/day: 1.50    Years: 47.00    Pack years: 70.50    Types: Cigarettes    Start date: 12/03/1966    Quit date: 01/02/2014    Years since quitting: 7.4   Smokeless tobacco: Never   Tobacco comments:    Stopped for 6 months once in 2007  Vaping Use   Vaping Use: Never used  Substance Use Topics   Alcohol use: No    Alcohol/week: 0.0 standard drinks   Drug use: No    Home  Medications Prior to Admission medications   Medication Sig Start Date End Date Taking? Authorizing Provider  acetaminophen (TYLENOL) 500 MG tablet Take 1,000 mg by mouth every 6 (six) hours as needed for mild pain, moderate pain, fever or headache.    Yes [provider]  amLODipine (NORVASC) 5 MG tablet Take 5 mg by mouth daily. 03/11/21  Yes [provider]  atenolol (TENORMIN) 50 MG tablet Take 50 mg by mouth daily.   Yes [provider]  Cholecalciferol (VITAMIN D3) 5000 units CAPS Take 5,000 Units by mouth every evening.    Yes [provider]  clopidogrel (PLAVIX) 75 MG tablet Take 75 mg by mouth daily.    Yes [provider]  DULoxetine (CYMBALTA) 30 MG capsule Take 60 mg by mouth daily at 12 noon.   Yes [provider]  ferrous sulfate 325 (65 FE) MG tablet Take 325 mg by mouth daily with breakfast.   Yes [provider]  fluticasone (FLONASE) 50 MCG/ACT nasal spray Place 1 spray into both nostrils daily.   Yes [provider]  Ipratropium-Albuterol (COMBIVENT RESPIMAT) 20-100 MCG/ACT AERS respimat Inhale 1 puff into the lungs every 4 (four) hours as needed for wheezing or shortness of breath. 11/15/20  Yes Magdalen Spatz, NP  ipratropium-albuterol (DUONEB) 0.5-2.5 (3) MG/3ML SOLN Take 3 mLs by nebulization every 6 (six) hours as needed (for wheezing/shortness of breath).   Yes [provider]  isosorbide mononitrate (IMDUR) 30 MG 24 hr tablet Take 30 mg by mouth in the morning and at bedtime. 09/24/14  Yes [provider]  lisinopril (PRINIVIL,ZESTRIL) 40 MG tablet Take 40 mg by mouth daily.   Yes [provider]  LORazepam (ATIVAN) 0.5 MG tablet Take 0.5 mg by mouth See admin instructions. Take 0.5 mg by mouth at bedtime and an additional 0.5 mg once a day as needed for anxiety   Yes [provider]  ondansetron (ZOFRAN) 4 MG tablet Take 1 tablet (4 mg total) by mouth every 8 (eight)  hours as needed for nausea or vomiting. 06/26/21  Yes Carmin Muskrat, MD  pantoprazole (PROTONIX) 40 MG tablet Take 40 mg by mouth in the morning and at bedtime.   Yes [provider]  simvastatin (ZOCOR) 20 MG tablet Take 1 tablet (20 mg total) by mouth daily. 06/26/21  Yes Carmin Muskrat, MD  traMADol (ULTRAM) 50 MG tablet Take 50 mg by mouth every 6 (six) hours as needed for moderate pain.    Yes [provider]  umeclidinium-vilanterol (ANORO ELLIPTA) 62.5-25 MCG/INH AEPB Inhale 1 puff into the lungs  daily. 09/22/19  Yes Juanito Doom, MD  metoprolol tartrate (LOPRESSOR) 100 MG tablet Take 1 tablet by mouth once for procedure. Patient not taking: Reported on 06/26/2021 06/01/21   Geralynn Rile, MD  umeclidinium-vilanterol [facility] ELLIPTA) 62.5-25 MCG/INH AEPB INHALE 1 PUFF BY MOUTH DAILY Patient not taking: Reported on 06/26/2021 05/10/21   Magdalen Spatz, NP    Allergies    Carvedilol, Buspirone, Dapsone, Niacin and related, Septra [sulfamethoxazole-trimethoprim], Amoxicillin, and Wellbutrin [bupropion]  Review of Systems   Review of Systems  Constitutional:        Per HPI, otherwise negative  HENT:         Per HPI, otherwise negative  Respiratory:         Per HPI, otherwise negative  Cardiovascular:        Per HPI, otherwise negative  Gastrointestinal:  Positive for abdominal pain. Negative for vomiting.  Endocrine:       Negative aside from HPI  Genitourinary:        Neg aside from HPI   Musculoskeletal:        Per HPI, otherwise negative  Skin: Negative.   Neurological:  Negative for syncope.   Physical Exam Updated Vital Signs BP 113/65   Pulse 75   Temp 99.3 F (37.4 C) (Oral)   Resp 16   Ht '5\' 5"'$  (1.651 m)   Wt 87.5 kg   SpO2 91%   BMI 32.12 kg/m   Physical Exam Vitals and nursing note reviewed.  Constitutional:      General: She is not in acute distress.    Appearance: She is well-developed.  HENT:     Head: Normocephalic and  atraumatic.  Eyes:     Conjunctiva/sclera: Conjunctivae normal.  Cardiovascular:     Rate and Rhythm: Normal rate and regular rhythm.  Pulmonary:     Effort: Pulmonary effort is normal. No respiratory distress.     Breath sounds: Normal breath sounds. No stridor.  Abdominal:     General: There is no distension.     Tenderness: There is abdominal tenderness. There is guarding.  Skin:    General: Skin is warm and dry.  Neurological:     Mental Status: She is alert and oriented to person, place, and time.     Cranial Nerves: No cranial nerve deficit.    ED Results / Procedures / Treatments   Labs (all labs ordered are listed, but only abnormal results are displayed) Labs Reviewed  COMPREHENSIVE METABOLIC PANEL - Abnormal; Notable for the following components:      Result Value   Sodium 134 (*)    Glucose, Bld 152 (*)    Creatinine, Ser 1.56 (*)    AST 168 (*)    ALT 187 (*)    Alkaline Phosphatase 142 (*)    Total Bilirubin 1.7 (*)    GFR, Estimated 35 (*)    All other components within normal limits  CBC WITH DIFFERENTIAL/PLATELET - Abnormal; Notable for the following components:   RBC 3.53 (*)    Hemoglobin 11.0 (*)    HCT 32.3 (*)    Lymphs Abs 0.4 (*)    All other components within normal limits  LIPASE, BLOOD  LACTIC ACID, PLASMA  LACTIC ACID, PLASMA  HEPATITIS PANEL, ACUTE    EKG None  Radiology  Echocardiogram from last month reviewed, preserved systolic function.   CT Angio Chest/Abd/Pel for Dissection W and/or Wo Contrast  Result Date: 06/26/2021 CLINICAL DATA:  Abdominal pain.  Aortic dissection suspected. EXAM: CT ANGIOGRAPHY CHEST, ABDOMEN AND PELVIS TECHNIQUE: Non-contrast CT of the chest was initially obtained. Multidetector CT imaging through the chest, abdomen and pelvis was performed using the standard protocol during bolus administration of intravenous contrast. Multiplanar reconstructed images and MIPs were obtained and reviewed to evaluate the  vascular anatomy. CONTRAST:  56m OMNIPAQUE IOHEXOL 350 MG/ML SOLN COMPARISON:  CT chest, abdomen and pelvis 05/26/2021; MRI pelvis 02/14/2015 FINDINGS: CTA CHEST FINDINGS Cardiovascular: Initial unenhanced CT imaging of the chest demonstrates no evidence of acute intramural hematoma. The heart is normal in size. Atherosclerotic calcifications present along the thoracic aorta and coronary arteries. No pericardial effusion. Following administration of intravenous contrast, there is adequate opacification of the thoracic aorta. Two vessel arch anatomy. The right brachiocephalic and left common carotid artery share a common origin. No evidence of aneurysm or dissection. Mediastinum/Nodes: Unremarkable CT appearance of the thyroid gland. No suspicious mediastinal or hilar adenopathy. No soft tissue mediastinal mass. The thoracic esophagus is unremarkable. Lungs/Pleura: Moderately severe centrilobular pulmonary emphysema. No suspicious pulmonary mass or nodule. Musculoskeletal: No acute fracture or aggressive appearing lytic or blastic osseous lesion. Review of the MIP images confirms the above findings. CTA ABDOMEN AND PELVIS FINDINGS VASCULAR Aorta: Ectatic infrarenal abdominal aorta. Accounting for underlying tortuosity, the maximal aortic diameter is 2.9 cm. Scattered heterogeneous atherosclerotic plaque. No evidence of dissection or acute abnormality. Celiac: Patent without evidence of aneurysm, dissection, vasculitis or significant stenosis. SMA: Calcified atherosclerotic plaque at the origin without significant stenosis. Renals: Solitary renal arteries bilaterally. Mixed calcified and fibrofatty atherosclerotic plaque results in at least moderate stenosis of the proximal right kidney. Mixed plaque on the left as well but with perhaps mild stenosis only. IMA: Patent without evidence of aneurysm, dissection, vasculitis or significant stenosis. Inflow: Multifocal atherosclerotic plaque without significant stenosis,  dissection or occlusion. No aneurysm. Veins: No focal venous abnormality. Review of the MIP images confirms the above findings. NON-VASCULAR Hepatobiliary: No focal liver abnormality is seen. Status post cholecystectomy. No biliary dilatation. Pancreas: Unremarkable. No pancreatic ductal dilatation or surrounding inflammatory changes. Spleen: Normal in size without focal abnormality. Adrenals/Urinary Tract: Normal appearance of the adrenal glands. 2.2 cm simple cyst exophytic from the posterior aspect of the left interpolar kidney. No hydronephrosis or nephrolithiasis. No enhancing renal mass. Unremarkable ureters. Tiny locule of gas within the bladder likely represents recent catheterization. Stomach/Bowel: No evidence of bowel obstruction or focal bowel wall thickening. Highly mobile right colon. The cecum and appendix are present in the left hemiabdomen. The position is slightly different than seen on prior images. No evidence of malrotation. The duodenum crosses the midline. Lymphatic: No suspicious lymphadenopathy. Reproductive: Stable large simple cyst affiliated with the right adnexa measuring approximately 8.6 x 5.7 cm. Previously evaluated on MRI from 2016 this has been felt to represent a benign cystic lesion. Unremarkable appearance of the uterus and left adnexa. Other: No abdominal wall hernia or abnormality. No abdominopelvic ascites. Musculoskeletal: No acute or significant osseous findings. L5-S1 degenerative disc disease. Review of the MIP images confirms the above findings. IMPRESSION: 1. No evidence of aortic dissection or other acute vascular abnormality. 2. Ectatic infrarenal abdominal aorta measuring up to 2.9 cm. Recommend follow-up ultrasound every 5 years. This recommendation follows ACR consensus guidelines: White Paper of the ACR Incidental Findings Committee II on Vascular Findings. J Am Coll Radiol 2013; 10:789-794. 3. Mobile cecum and accompanying appendix which is abnormally located in  the left hemiabdomen indicating incomplete or absent retroperitoneal attachment. No evidence of associated  inflammation or distension to suggest volvulus at this time. Mobility and positioning may place the patient at risk for volvulus or cecal bascule in the future. 4.  Aortic Atherosclerosis (ICD10-170.0) 5.  Emphysema (ICD10-J43.9). 6. L5-S1 degenerative disc disease. 7. Stable simple right pelvic/adnexal cyst dating back to 2016. Greater than 5 year stability is consistent with benignity. Electronically Signed   By: Jacqulynn Cadet M.D.   On: 06/26/2021 12:24    Procedures Procedures   Medications Ordered in ED Medications  fentaNYL (SUBLIMAZE) injection 12.5 mcg (12.5 mcg Intravenous Given 06/26/21 1037)  sodium chloride 0.9 % bolus 500 mL (0 mLs Intravenous Stopped 06/26/21 1210)  iohexol (OMNIPAQUE) 350 MG/ML injection 75 mL (60 mLs Intravenous Contrast Given 06/26/21 1139)    ED Course  I have reviewed the triage vital signs and the nursing notes.  Pertinent labs & imaging results that were available during my care of the patient were reviewed by me and considered in my medical decision making (see chart for details). Cardiac 100 sinus rhythm/sinus tach abnormal Pulse ox 99% room air normal   3:11 PM Patient awake, alert, sitting upright, speaking clearly.  With her daughter on speaker phone we discussed all findings again.  CT reviewed, discussed.  CT without evidence for enlargement of her known infrarenal abdominal aortic aneurysm, no evidence for dissection, generally reassuring. Labs notable for elevated liver enzymes, and consideration of this possibilities include food reaction, versus medication reaction.  Patient has recently switched from Zocor to Lipitor after recent hospitalization for chest pain.  Without other alarming findings, with substantial clinical improvement here, without evidence for peritonitis, other acute abdominal processes, patient appropriate for discharge with  close outpatient follow-up with GI and cardiology as well as primary care for consideration of her medication regimen.  Hepatitis panel sent on discharge. MDM Rules/Calculators/A&P MDM Number of Diagnoses or Management Options Generalized abdominal pain: new, needed workup   Amount and/or Complexity of Data Reviewed Clinical lab tests: ordered and reviewed Tests in the radiology section of CPT: ordered and reviewed Tests in the medicine section of CPT: reviewed and ordered Decide to obtain previous medical records or to obtain history from someone other than the patient: yes Obtain history from someone other than the patient: yes Review and summarize past medical records: yes Independent visualization of images, tracings, or specimens: yes  Risk of Complications, Morbidity, and/or Mortality Presenting problems: high Diagnostic procedures: high Management options: high  Critical Care Total time providing critical care: < 30 minutes  Patient Progress Patient progress: improved   Final Clinical Impression(s) / ED Diagnoses Final diagnoses:  Generalized abdominal pain    Rx / DC Orders ED Discharge Orders          Ordered    atorvastatin (LIPITOR) 10 MG tablet  Daily,   Status:  Discontinued        06/26/21 1506    ondansetron (ZOFRAN) 4 MG tablet  Every 8 hours PRN        06/26/21 1506    simvastatin (ZOCOR) 20 MG tablet  Daily        06/26/21 1508             Carmin Muskrat, MD 06/26/21 1512

## 2021-06-28 ENCOUNTER — Telehealth: Payer: Self-pay | Admitting: Gastroenterology

## 2021-06-28 ENCOUNTER — Ambulatory Visit: Payer: Medicare Other

## 2021-06-28 ENCOUNTER — Telehealth: Payer: Self-pay | Admitting: Cardiovascular Disease

## 2021-06-28 ENCOUNTER — Encounter (HOSPITAL_COMMUNITY): Payer: Medicare Other

## 2021-06-28 DIAGNOSIS — N1832 Chronic kidney disease, stage 3b: Secondary | ICD-10-CM | POA: Diagnosis not present

## 2021-06-28 DIAGNOSIS — D649 Anemia, unspecified: Secondary | ICD-10-CM | POA: Diagnosis not present

## 2021-06-28 DIAGNOSIS — J449 Chronic obstructive pulmonary disease, unspecified: Secondary | ICD-10-CM | POA: Diagnosis not present

## 2021-06-28 DIAGNOSIS — I700 Atherosclerosis of aorta: Secondary | ICD-10-CM | POA: Diagnosis not present

## 2021-06-28 DIAGNOSIS — R7989 Other specified abnormal findings of blood chemistry: Secondary | ICD-10-CM

## 2021-06-28 DIAGNOSIS — R945 Abnormal results of liver function studies: Secondary | ICD-10-CM | POA: Diagnosis not present

## 2021-06-28 NOTE — Telephone Encounter (Signed)
Pt c/o medication issue:  1. Name of Medication: simvastatin (ZOCOR) 20 MG tablet  2. How are you currently taking this medication (dosage and times per day)? Patient started taking on Tuesday night  3. Are you having a reaction (difficulty breathing--STAT)?   4. What is your medication issue?  Patient has not taken atorvastatin (LIPITOR) 10 MG tablet  Patient went to the hospital last week. While she was there she was taken off her lipitor and put back on Zocor because her liver and kidney function has diminished.  She is going to her PCP and a GI doctor also to follow up, but she wanted to let Dr. Audie Box be in the loop about what is going on

## 2021-06-28 NOTE — Telephone Encounter (Addendum)
She has a hx of IDA in 2020 with Dr. Carlean Purl . Patient with recent ED visit for abdominal pain.and general malaise. She also had elevated LFTs in the ED  She had a recent change in her statin medications that ED thought could account for elevation of her LFT.  Acute Hepatitis panel was non-reactive.   She is given the next available appointment for 07/22/21 with Alonza Bogus, PA. She is placed on the cancellation list.  She had elevations in her glucose and serum creatinine as well.  She is encouraged to contact cardiology about the elevated LFTs and inquire could it be related to statin changes.  She is also encouraged to contact her PCP for follow up about other lab abnormalities.

## 2021-06-28 NOTE — Telephone Encounter (Signed)
Patient called states she was just seen at the ED for abdominal pain and is seeking further advise. Said she is in a lot of pain and needs help.

## 2021-06-28 NOTE — Telephone Encounter (Signed)
Pt last saw Dr Carlean Purl in 2020.

## 2021-06-28 NOTE — Telephone Encounter (Signed)
Patient notified  She will come for labs next Tuesday. She will see her PCP today as well

## 2021-06-28 NOTE — Telephone Encounter (Signed)
Returned call to patient who states that she was recently switched off of her Lipitor and started back on Simvastatin '20mg'$  daily due to elevated liver function studies. Patient states that she will have labs drawn today to recheck her LFT's with her PCP and will have LFT's again on Monday/Tuesday of next week to watch the trend. Advised patient I would forward message to Dr. Audie Box to make him aware. Patient verbalized understanding.

## 2021-06-28 NOTE — Telephone Encounter (Signed)
Called patient to schedule left voicemail. 

## 2021-06-28 NOTE — Addendum Note (Signed)
Addended by: Marlon Pel on: 06/28/2021 12:29 PM   Modules accepted: Orders

## 2021-06-28 NOTE — Telephone Encounter (Signed)
Agree  They changed atorvastatin to simvistatin in ED  Please have her do LFT's next Mon or Tues also

## 2021-07-03 ENCOUNTER — Other Ambulatory Visit (INDEPENDENT_AMBULATORY_CARE_PROVIDER_SITE_OTHER): Payer: Medicare Other

## 2021-07-03 DIAGNOSIS — R7989 Other specified abnormal findings of blood chemistry: Secondary | ICD-10-CM | POA: Diagnosis not present

## 2021-07-03 LAB — HEPATIC FUNCTION PANEL
ALT: 60 U/L — ABNORMAL HIGH (ref 0–35)
AST: 30 U/L (ref 0–37)
Albumin: 4.2 g/dL (ref 3.5–5.2)
Alkaline Phosphatase: 138 U/L — ABNORMAL HIGH (ref 39–117)
Bilirubin, Direct: 0.1 mg/dL (ref 0.0–0.3)
Total Bilirubin: 0.7 mg/dL (ref 0.2–1.2)
Total Protein: 7.9 g/dL (ref 6.0–8.3)

## 2021-07-04 ENCOUNTER — Telehealth: Payer: Self-pay | Admitting: Internal Medicine

## 2021-07-04 NOTE — Telephone Encounter (Signed)
Inbound call from patient. States she would like a call back to discuss the potential reason her liver enzymes have increased.   And how long have to wait to get flu and pneumonia shots after test?  Thank you.

## 2021-07-04 NOTE — Telephone Encounter (Signed)
Forwarding to Dr. Carlean Purl, her primary GI physician.

## 2021-07-04 NOTE — Telephone Encounter (Signed)
Returned pt call. Advised of the following response from Dr. Carlean Purl sent via My Chart re: labs:  Liver tests are much better.  Almost normal.  This is very encouraging and goes against any serious liver trouble.  I doubt it was the statin as well but switching was reasonable.   Hope you are feeling better.  Please let me know if any questions otherwise my PA Janett Billow will see you on October 2.   Shannon Mayer, MD, [facility]  Advised I am not seeing any indication to explain why her liver function may have changed so drastically. To reassure her concern, advised I will pass this along to Dr. Carlean Purl for his input. Expressed appreciation. Pt states she typically gets vaccinated in October and wondered if that was too long to wait or not long enough. Pt advised she is welcome to wait until October to receive her vaccines and is not deemed "too long to wait". Pt verbalized acceptance and understanding.

## 2021-07-04 NOTE — Telephone Encounter (Signed)
So sorry for the mix up. I meant to send to [facility]. Thank you

## 2021-07-15 ENCOUNTER — Other Ambulatory Visit: Payer: Self-pay | Admitting: Acute Care

## 2021-07-17 ENCOUNTER — Telehealth: Payer: Self-pay | Admitting: Pulmonary Disease

## 2021-07-17 MED ORDER — ANORO ELLIPTA 62.5-25 MCG/INH IN AEPB: 1.0000 | INHALATION_SPRAY | Freq: Every day | RESPIRATORY_TRACT | 0 refills | Status: DC

## 2021-07-17 NOTE — Telephone Encounter (Signed)
I have called the pt and she is aware of rx that has been sent to the pharmacy.   Nothing  further is needed. Pt is aware of her appt date and time.

## 2021-07-27 ENCOUNTER — Ambulatory Visit: Payer: Medicare Other | Admitting: Gastroenterology

## 2021-07-27 ENCOUNTER — Encounter: Payer: Self-pay | Admitting: Gastroenterology

## 2021-07-27 VITALS — BP 100/60 | HR 69 | Ht 64.0 in | Wt 188.0 lb

## 2021-07-27 DIAGNOSIS — R7989 Other specified abnormal findings of blood chemistry: Secondary | ICD-10-CM | POA: Diagnosis not present

## 2021-07-27 NOTE — Patient Instructions (Signed)
If you are age 71 or younger, your body mass index should be between 19-25. Your Body mass index is 32.27 kg/m. If this is out of the aformentioned range listed, please consider follow up with your Primary Care Provider.  __________________________________________________________  The  GI providers would like to encourage you to use [facility] to communicate with providers for non-urgent requests or questions.  Due to long hold times on the telephone, sending your provider a message by [facility] may be a faster and more efficient way to get a response.  Please allow 48 business hours for a response.  Please remember that this is for non-urgent requests.   Your provider has requested that you go to the basement level for lab work in 3 weeks (08/17/2021). Press "B" on the elevator. The lab is located at the first door on the left as you exit the elevator.  Follow up pending the results of your lab or as needed.  Thank you for entrusting me with your care and choosing [facility].  Alonza Bogus, PA-C

## 2021-07-27 NOTE — Progress Notes (Signed)
07/27/2021 Shannon Roberts [id number] 08-20-1951   HISTORY OF PRESENT ILLNESS:  This is a 70 year old female who is a patient of Dr. Celesta Aver.  She is here today for follow-up of elevated LFTs.  This was a new, sudden elevation of LFTs when she presented to the emergency department about a month ago with complaints of right upper quadrant abdominal pain and generally not feeling well.  She had had a change in her statin medications so they were questioning if it was possibly related to that.  A week later LFTs had almost completely returned to normal with only an ALT of 60 and alk phos of 130.  Her liver looks fine on CT angio in August.  Viral hepatitis studies negative.  Her abdominal pain is gone and she feels fine otherwise.  Past Medical History:  Diagnosis Date   Allergic rhinitis    Anemia    per hosp. - recently- pt. told that she is anemic   Anxiety    Colitis    COPD (chronic obstructive pulmonary disease) ([place])    no o2   Depression    Diverticulosis    Female bladder prolapse    Fibroids    GERD (gastroesophageal reflux disease)    Heart murmur    Hx of adenomatous colonic polyps 08/26/2019   Hyperlipemia    Hypertension    IBS (irritable bowel syndrome)    Insomnia    Lip abscess 04/05/2016   Mitral valve prolapse    Peripheral vascular disease ([place])    Pre-diabetes 08/03/2019   Diet controlled     Right bundle branch block    Sciatic nerve disease    Shortness of breath    Stroke [facility]) March 2015   Past Surgical History:  Procedure Laterality Date   CAROTID ENDARTERECTOMY Right January 17, 2014   CE   CHOLECYSTECTOMY     COLONOSCOPY     ENDARTERECTOMY Right 01/17/2014   Procedure: RIGHT CAROTID ARTERY ENDARTERECTOMY WITH DACRON PATCH ANGIOPLASTY;  Surgeon: Rosetta Posner, MD;  Location: [place];  Service: Vascular;  Laterality: Right;   UPPER GASTROINTESTINAL ENDOSCOPY      reports that she quit smoking about 7 years ago. Her smoking use included  cigarettes. She started smoking about 54 years ago. She has a 70.50 pack-year smoking history. She has never used smokeless tobacco. She reports that she does not drink alcohol and does not use drugs. family history includes AAA (abdominal aortic aneurysm) in her mother; Breast cancer in her maternal aunt and mother; Colon cancer in her paternal aunt; Diabetes in her maternal aunt and paternal grandfather; Emphysema in her mother and paternal aunt; Heart disease in her paternal grandfather; Kidney disease in her maternal aunt; Lung cancer in her father; Ovarian cancer in her mother; Pancreatic cancer in her maternal grandmother. Allergies  Allergen Reactions   Carvedilol Palpitations   Buspirone Other (See Comments)    Reaction not recalled   Dapsone Other (See Comments)    Flushing    Niacin And Related Other (See Comments)    Flushing    Septra [Sulfamethoxazole-Trimethoprim] Diarrhea and Nausea And Vomiting   Amoxicillin Rash and Other (See Comments)    Has patient had a PCN reaction causing immediate rash, facial/tongue/throat swelling, SOB or lightheadedness with hypotension: No Has patient had a PCN reaction causing severe rash involving mucus membranes or skin necrosis: No Has patient had a PCN reaction that required hospitalization No Has patient had a PCN  reaction occurring within the last 10 years: No If all of the above answers are "NO", then may proceed with Cephalosporin use.   Wellbutrin [Bupropion] Anxiety and Other (See Comments)    Caused the patient to feel "jittery"      Outpatient Encounter Medications as of 07/27/2021  Medication Sig   acetaminophen (TYLENOL) 500 MG tablet Take 500-1,000 mg by mouth every 6 (six) hours as needed for mild pain, moderate pain, fever or headache.   amLODipine (NORVASC) 5 MG tablet Take 5 mg by mouth daily.   atenolol (TENORMIN) 50 MG tablet Take 50 mg by mouth daily.   Cholecalciferol (VITAMIN D3) 5000 units CAPS Take 5,000 Units by mouth  every evening.    clopidogrel (PLAVIX) 75 MG tablet Take 75 mg by mouth daily.    DULoxetine (CYMBALTA) 30 MG capsule Take 60 mg by mouth daily at 12 noon. Patient take 3 tablets daily.   ferrous sulfate 325 (65 FE) MG tablet Take 325 mg by mouth daily with breakfast.   fluticasone (FLONASE) 50 MCG/ACT nasal spray Place 1 spray into both nostrils daily.   Ipratropium-Albuterol (COMBIVENT RESPIMAT) 20-100 MCG/ACT AERS respimat Inhale 1 puff into the lungs every 4 (four) hours as needed for wheezing or shortness of breath.   ipratropium-albuterol (DUONEB) 0.5-2.5 (3) MG/3ML SOLN Take 3 mLs by nebulization every 6 (six) hours as needed (for wheezing/shortness of breath).   isosorbide mononitrate (IMDUR) 30 MG 24 hr tablet Take 30 mg by mouth in the morning and at bedtime.   lisinopril (PRINIVIL,ZESTRIL) 40 MG tablet Take 40 mg by mouth daily.   LORazepam (ATIVAN) 0.5 MG tablet Take 0.5 mg by mouth See admin instructions. Take 0.5 mg by mouth at bedtime and an additional 0.5 mg once a day as needed for anxiety   pantoprazole (PROTONIX) 40 MG tablet Take 40 mg by mouth in the morning and at bedtime.   simvastatin (ZOCOR) 20 MG tablet Take 1 tablet (20 mg total) by mouth daily.   traMADol (ULTRAM) 50 MG tablet Take 50 mg by mouth every 6 (six) hours as needed for moderate pain.    umeclidinium-vilanterol (ANORO ELLIPTA) 62.5-25 MCG/INH AEPB Inhale 1 puff into the lungs daily.   [DISCONTINUED] metoprolol tartrate (LOPRESSOR) 100 MG tablet Take 1 tablet by mouth once for procedure. (Patient not taking: No sig reported)   [DISCONTINUED] ondansetron (ZOFRAN) 4 MG tablet Take 1 tablet (4 mg total) by mouth every 8 (eight) hours as needed for nausea or vomiting.   [DISCONTINUED] umeclidinium-vilanterol (ANORO ELLIPTA) 62.5-25 MCG/INH AEPB INHALE 1 PUFF BY MOUTH DAILY (Patient not taking: No sig reported)   No facility-administered encounter medications on file as of 07/27/2021.     REVIEW OF SYSTEMS  : All  other systems reviewed and negative except where noted in the History of Present Illness.   PHYSICAL EXAM: BP 100/60   Pulse 69   Ht '5\' 4"'  (1.626 m)   Wt 188 lb (85.3 kg)   BMI 32.27 kg/m  General: Well developed white female in no acute distress Head: Normocephalic and atraumatic Eyes:  Sclerae anicteric, conjunctiva pink. Ears: Normal auditory acuity Lungs: Clear throughout to auscultation; no W/R/R. Heart: Regular rate and rhythm; no M/R/G. Abdomen: Soft, non-distended.  BS present.  Non-tender. Musculoskeletal: Symmetrical with no gross deformities  Skin: No lesions on visible extremities Extremities: No edema  Neurological: Alert oriented x 4, grossly non-focal Psychological:  Alert and cooperative. Normal mood and affect  ASSESSMENT AND PLAN: *New, sudden elevation  of LFTs when she presented to the emergency department about a month ago with complaints of right upper quadrant abdominal pain and generally not feeling well.  She had had a change in her statin medications so they were questioning if it was possibly related to that.  A week later LFTs had almost completely returned to normal with only an ALT of 60 and alk phos of 130.  I think that this was likely something viral or could have been reactive to what ever else was going on in her body.  I am not convinced that it was due to the statin.  She can address any other changes with the statins with her cardiologist.  Her liver looks fine on CT angio in August.  Viral hepatitis studies negative. She is fine to take Tylenol as needed and to get her flu shot when she would like to do so.  These were questions that she had for me today.  We will repeat her LFTs again in 3 weeks, which will be 6 weeks from the last.  Hopefully they have completely normalized and no further work-up will be needed.  CC:  Lawerance Cruel, MD

## 2021-08-07 ENCOUNTER — Other Ambulatory Visit: Payer: Self-pay

## 2021-08-07 ENCOUNTER — Encounter: Payer: Self-pay | Admitting: Pulmonary Disease

## 2021-08-07 ENCOUNTER — Ambulatory Visit: Payer: Medicare Other | Admitting: Pulmonary Disease

## 2021-08-07 VITALS — BP 114/70 | HR 65 | Ht 64.0 in | Wt 187.0 lb

## 2021-08-07 DIAGNOSIS — J432 Centrilobular emphysema: Secondary | ICD-10-CM

## 2021-08-07 MED ORDER — COMBIVENT RESPIMAT 20-100 MCG/ACT IN AERS: 1.0000 | INHALATION_SPRAY | RESPIRATORY_TRACT | 11 refills | Status: AC | PRN

## 2021-08-07 NOTE — Patient Instructions (Addendum)
Continue on anoro inhaler 1 puff daily  Continue to use combivent inhaler 1 puff every 4 hours as needed for shortness of breath or wheezing.  We will continue to monitor your lungs via the CT scans for your aneurysm.   You are safe to get your covid and influenza vaccines

## 2021-08-07 NOTE — Progress Notes (Signed)
Synopsis: Referred in October 2022 for COPD. [name] patient.  Subjective:   PATIENT ID: Shannon Roberts GENDER: female DOB: 1951/05/05, MRN: [id number]   HPI  Chief Complaint  Patient presents with   Establish Care    Former Carlis Abbott pt for COPD. States breathing has been stable since last visit. Still has some SOB with exertion. States she does have a cough but thinks this is coming from her sinuses.    Shannon Roberts is a 70 year old woman, former smoker with history of COPD who returns to pulmonary clinic for follow up and to establish care with new provider.  She is currently using Anoro ellipta 1 puff daily. She is using albuterol nebulizer treatments as needed when home or using combivent inhaler as needed when away from home. She is using combivent at least once a day after doing exertional activity. She denies any night time awakenings. She has cough this time of year due to sinus congestion and drainage. She does use flonase as needed. She reports having about 2 exacerbations on average per year.  She is receiving monitoring CT scans for an aneurysm which she does not have any concerning nodules or lesions noted from the most recent scan on 06/26/21.   She has a 70 pack year smoking history and quit in 2015.   Past Medical History:  Diagnosis Date   Allergic rhinitis    Anemia    per hosp. - recently- pt. told that she is anemic   Anxiety    Colitis    COPD (chronic obstructive pulmonary disease) ([facility])    no o2   Depression    Diverticulosis    Female bladder prolapse    Fibroids    GERD (gastroesophageal reflux disease)    Heart murmur    Hx of adenomatous colonic polyps 08/26/2019   Hyperlipemia    Hypertension    IBS (irritable bowel syndrome)    Insomnia    Lip abscess 04/05/2016   Mitral valve prolapse    Peripheral vascular disease ([place])    Pre-diabetes 08/03/2019   Diet controlled     Right bundle branch block    Sciatic nerve disease     Shortness of breath    Stroke [facility]) March 2015     Family History  Problem Relation Age of Onset   Breast cancer Mother    Ovarian cancer Mother    AAA (abdominal aortic aneurysm) Mother    Emphysema Mother    Lung cancer Father    Emphysema Paternal Aunt    Pancreatic cancer Maternal Grandmother    Diabetes Maternal Aunt    Breast cancer Maternal Aunt    Heart disease Paternal Grandfather    Diabetes Paternal Grandfather    Kidney disease Maternal Aunt    Colon cancer Paternal Aunt    Colon polyps Neg Hx    Esophageal cancer Neg Hx    Rectal cancer Neg Hx    Stomach cancer Neg Hx      Social History   Socioeconomic History   Marital status: Divorced    Spouse name: Not on file   Number of children: 2   Years of education: ASSOCIATES   Highest education level: Not on file  Occupational History   Occupation: Therapist, sports  Tobacco Use   Smoking status: Former    Packs/day: 1.50    Years: 47.00    Pack years: 70.50    Types: Cigarettes    Start date: 12/03/1966  Quit date: 01/02/2014    Years since quitting: 7.6   Smokeless tobacco: Never   Tobacco comments:    Stopped for 6 months once in 2007  Vaping Use   Vaping Use: Never used  Substance and Sexual Activity   Alcohol use: No    Alcohol/week: 0.0 standard drinks   Drug use: No   Sexual activity: Not Currently  Other Topics Concern   Not on file  Social History Narrative   Patient is divorced with 2 children.   Patient is right handed.   Patient has her Associates degree.   Patient drinks 3 cups daily.      [facility] (07/30/17):   Originally from [facility]. Has always lived in [place]. 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 Previously had cockatoo and a [place] for months. Did have mold in her current home that was remediated. Enjoys playing on the computer, reading, & babysitting.     Social Determinants of Health   Financial Resource Strain: Not on file  Food Insecurity: Not on file   Transportation Needs: Not on file  Physical Activity: Not on file  Stress: Not on file  Social Connections: Not on file  Intimate Partner Violence: Not on file     Allergies  Allergen Reactions   Carvedilol Palpitations   Buspirone Other (See Comments)    Reaction not recalled   Dapsone Other (See Comments)    Flushing    Niacin And Related Other (See Comments)    Flushing    Septra [Sulfamethoxazole-Trimethoprim] Diarrhea and Nausea And Vomiting   Amoxicillin Rash and Other (See Comments)    Has patient had a PCN reaction causing immediate rash, facial/tongue/throat swelling, SOB or lightheadedness with hypotension: No Has patient had a PCN reaction causing severe rash involving mucus membranes or skin necrosis: No Has patient had a PCN reaction that required hospitalization No Has patient had a PCN reaction occurring within the last 10 years: No If all of the above answers are "NO", then may proceed with Cephalosporin use.   Wellbutrin [Bupropion] Anxiety and Other (See Comments)    Caused the patient to feel "jittery"     Outpatient Medications Prior to Visit  Medication Sig Dispense Refill   acetaminophen (TYLENOL) 500 MG tablet Take 500-1,000 mg by mouth every 6 (six) hours as needed for mild pain, moderate pain, fever or headache.     amLODipine (NORVASC) 5 MG tablet Take 5 mg by mouth daily.     atenolol (TENORMIN) 50 MG tablet Take 50 mg by mouth daily.     Cholecalciferol (VITAMIN D3) 5000 units CAPS Take 5,000 Units by mouth every evening.      clopidogrel (PLAVIX) 75 MG tablet Take 75 mg by mouth daily.      DULoxetine (CYMBALTA) 30 MG capsule Take 90 mg by mouth daily at 12 noon. Patient take 3 tablets daily.     ferrous sulfate 325 (65 FE) MG tablet Take 325 mg by mouth daily with breakfast.     fluticasone (FLONASE) 50 MCG/ACT nasal spray Place 1 spray into both nostrils daily.     ipratropium-albuterol (DUONEB) 0.5-2.5 (3) MG/3ML SOLN Take 3 mLs by nebulization  every 6 (six) hours as needed (for wheezing/shortness of breath).     isosorbide mononitrate (IMDUR) 30 MG 24 hr tablet Take 30 mg by mouth in the morning and at bedtime.  0   lisinopril (PRINIVIL,ZESTRIL) 40 MG tablet Take 40 mg by mouth daily.  LORazepam (ATIVAN) 0.5 MG tablet Take 0.5 mg by mouth See admin instructions. Take 0.5 mg by mouth at bedtime and an additional 0.5 mg once a day as needed for anxiety  0   pantoprazole (PROTONIX) 40 MG tablet Take 40 mg by mouth in the morning and at bedtime.     simvastatin (ZOCOR) 40 MG tablet Take 40 mg by mouth daily.     traMADol (ULTRAM) 50 MG tablet Take 50 mg by mouth every 6 (six) hours as needed for moderate pain.      Ipratropium-Albuterol (COMBIVENT RESPIMAT) 20-100 MCG/ACT AERS respimat Inhale 1 puff into the lungs every 4 (four) hours as needed for wheezing or shortness of breath. 4 g 11   umeclidinium-vilanterol (ANORO ELLIPTA) 62.5-25 MCG/INH AEPB Inhale 1 puff into the lungs daily. 60 each 0   simvastatin (ZOCOR) 20 MG tablet Take 1 tablet (20 mg total) by mouth daily. 30 tablet 0   No facility-administered medications prior to visit.    Review of Systems  Constitutional:  Negative for chills, fever, malaise/fatigue and weight loss.  HENT:  Positive for congestion. Negative for sinus pain and sore throat.   Eyes: Negative.   Respiratory:  Positive for cough and shortness of breath. Negative for hemoptysis, sputum production and wheezing.   Cardiovascular:  Negative for chest pain, palpitations, orthopnea, claudication and leg swelling.  Gastrointestinal:  Negative for abdominal pain, heartburn, nausea and vomiting.  Genitourinary: Negative.   Musculoskeletal:  Negative for joint pain and myalgias.  Skin:  Negative for rash.  Neurological:  Negative for weakness.  Endo/Heme/Allergies: Negative.   Psychiatric/Behavioral: Negative.     Objective:   Vitals:   08/07/21 1528  BP: 114/70  Pulse: 65  SpO2: 99%  Weight: 187 lb  (84.8 kg)  Height: 5\' 4"  (1.626 m)   Physical Exam Constitutional:      General: She is not in acute distress.    Appearance: She is not ill-appearing.  HENT:     Head: Normocephalic and atraumatic.  Eyes:     General: No scleral icterus.    Conjunctiva/sclera: Conjunctivae normal.     Pupils: Pupils are equal, round, and reactive to light.  Cardiovascular:     Rate and Rhythm: Normal rate and regular rhythm.     Pulses: Normal pulses.     Heart sounds: Normal heart sounds. No murmur heard. Pulmonary:     Effort: Pulmonary effort is normal.     Breath sounds: Normal breath sounds. No wheezing, rhonchi or rales.  Abdominal:     General: Bowel sounds are normal.     Palpations: Abdomen is soft.  Musculoskeletal:     Right lower leg: No edema.     Left lower leg: No edema.  Lymphadenopathy:     Cervical: No cervical adenopathy.  Skin:    General: Skin is warm and dry.  Neurological:     General: No focal deficit present.     Mental Status: She is alert.  Psychiatric:        Mood and Affect: Mood normal.        Behavior: Behavior normal.        Thought Content: Thought content normal.        Judgment: Judgment normal.   CBC    Component Value Date/Time   WBC 8.9 06/26/2021 1033   RBC 3.53 (L) 06/26/2021 1033   HGB 11.0 (L) 06/26/2021 1033   HCT 32.3 (L) 06/26/2021 1033   PLT 200 06/26/2021 1033  MCV 91.5 06/26/2021 1033   MCV 96.0 01/02/2014 1825   MCH 31.2 06/26/2021 1033   MCHC 34.1 06/26/2021 1033   RDW 13.0 06/26/2021 1033   LYMPHSABS 0.4 (L) 06/26/2021 1033   MONOABS 0.6 06/26/2021 1033   EOSABS 0.3 06/26/2021 1033   BASOSABS 0.0 06/26/2021 1033   BMP Latest Ref Rng & Units 06/26/2021 05/26/2021 05/26/2021  Glucose 70 - 99 mg/dL 152(H) 130(H) 142(H)  BUN 8 - 23 mg/dL 22 19 19   Creatinine 0.44 - 1.00 mg/dL 1.56(H) 1.10(H) 1.31(H)  Sodium 135 - 145 mmol/L 134(L) 137 130(L)  Potassium 3.5 - 5.1 mmol/L 4.7 4.0 4.2  Chloride 98 - 111 mmol/L 102 104 98  CO2 22 -  32 mmol/L 22 - 22  Calcium 8.9 - 10.3 mg/dL 10.1 - 9.5   Chest imaging: CTA Chest 06/26/21 Mediastinum/Nodes: Unremarkable CT appearance of the thyroid gland. No suspicious mediastinal or hilar adenopathy. No soft tissue mediastinal mass. The thoracic esophagus is unremarkable.   Lungs/Pleura: Moderately severe centrilobular pulmonary emphysema. No suspicious pulmonary mass or nodule.  PFT: PFT Results Latest Ref Rng & Units 09/01/2017  FVC-Pre L 1.92  FVC-Predicted Pre % 61  FVC-Post L 2.23  FVC-Predicted Post % 71  Pre FEV1/FVC % % 52  Post FEV1/FCV % % 56  FEV1-Pre L 1.00  FEV1-Predicted Pre % 42  FEV1-Post L 1.25  DLCO uncorrected ml/min/mmHg 12.39  DLCO UNC% % 49  DLCO corrected ml/min/mmHg 13.68  DLCO COR %Predicted % 54  DLVA Predicted % 68  TLC L 5.89  TLC % Predicted % 114  RV % Predicted % 175  PFT 2018: Moderately Severe Obstruction. Air trapping present. Moderate Diffusion defect.   Labs: Alpha 1 antitrypsin 07/30/17 is MM, level 162  Path:  Echo 06/21/21: LV EF 60-65%. Grade I diastolic dysfunction. RV systolic function is normal. RV size is normal.   Heart Catheterization:  Assessment & Plan:   Centrilobular emphysema ([place]) - Plan: Ipratropium-Albuterol (COMBIVENT RESPIMAT) 20-100 MCG/ACT AERS respimat, Pulse oximetry, overnight  Discussion: Paislea Hatton is a 70 year old woman, former smoker with history of COPD who returns to pulmonary clinic for follow up and to establish care with new provider.  She is to continue with Anoro Ellipta 1 puff daily and as needed combivent inhaler use. She is safe to get her covid and influenza vaccinations. She is eligible for lung cancer screening but she is receiving CT chest scans for monitoring of an aortic aneurysm. We will follow these scans in order to perform lung cancer surveillance.   Follow up in 6 months.   Shannon Jackson, MD [facility] Office: [phone]   Current Outpatient  Medications:    acetaminophen (TYLENOL) 500 MG tablet, Take 500-1,000 mg by mouth every 6 (six) hours as needed for mild pain, moderate pain, fever or headache., Disp: , Rfl:    amLODipine (NORVASC) 5 MG tablet, Take 5 mg by mouth daily., Disp: , Rfl:    atenolol (TENORMIN) 50 MG tablet, Take 50 mg by mouth daily., Disp: , Rfl:    Cholecalciferol (VITAMIN D3) 5000 units CAPS, Take 5,000 Units by mouth every evening. , Disp: , Rfl:    clopidogrel (PLAVIX) 75 MG tablet, Take 75 mg by mouth daily. , Disp: , Rfl:    DULoxetine (CYMBALTA) 30 MG capsule, Take 90 mg by mouth daily at 12 noon. Patient take 3 tablets daily., Disp: , Rfl:    ferrous sulfate 325 (65 FE) MG tablet, Take 325  mg by mouth daily with breakfast., Disp: , Rfl:    fluticasone (FLONASE) 50 MCG/ACT nasal spray, Place 1 spray into both nostrils daily., Disp: , Rfl:    ipratropium-albuterol (DUONEB) 0.5-2.5 (3) MG/3ML SOLN, Take 3 mLs by nebulization every 6 (six) hours as needed (for wheezing/shortness of breath)., Disp: , Rfl:    isosorbide mononitrate (IMDUR) 30 MG 24 hr tablet, Take 30 mg by mouth in the morning and at bedtime., Disp: , Rfl: 0   lisinopril (PRINIVIL,ZESTRIL) 40 MG tablet, Take 40 mg by mouth daily., Disp: , Rfl:    LORazepam (ATIVAN) 0.5 MG tablet, Take 0.5 mg by mouth See admin instructions. Take 0.5 mg by mouth at bedtime and an additional 0.5 mg once a day as needed for anxiety, Disp: , Rfl: 0   pantoprazole (PROTONIX) 40 MG tablet, Take 40 mg by mouth in the morning and at bedtime., Disp: , Rfl:    simvastatin (ZOCOR) 40 MG tablet, Take 40 mg by mouth daily., Disp: , Rfl:    traMADol (ULTRAM) 50 MG tablet, Take 50 mg by mouth every 6 (six) hours as needed for moderate pain. , Disp: , Rfl:    Ipratropium-Albuterol (COMBIVENT RESPIMAT) 20-100 MCG/ACT AERS respimat, Inhale 1 puff into the lungs every 4 (four) hours as needed for wheezing or shortness of breath., Disp: 4 g, Rfl: 11   umeclidinium-vilanterol (ANORO  ELLIPTA) 62.5-25 MCG/ACT AEPB, INHALE 1 PUFF INTO THE LUNGS DAILY, Disp: 60 each, Rfl: 6

## 2021-08-12 ENCOUNTER — Other Ambulatory Visit: Payer: Self-pay | Admitting: Pulmonary Disease

## 2021-08-14 ENCOUNTER — Other Ambulatory Visit: Payer: Self-pay | Admitting: Pulmonary Disease

## 2021-08-15 ENCOUNTER — Encounter: Payer: Self-pay | Admitting: Pulmonary Disease

## 2021-08-15 DIAGNOSIS — F324 Major depressive disorder, single episode, in partial remission: Secondary | ICD-10-CM | POA: Diagnosis not present

## 2021-08-15 DIAGNOSIS — F419 Anxiety disorder, unspecified: Secondary | ICD-10-CM | POA: Diagnosis not present

## 2021-08-16 ENCOUNTER — Ambulatory Visit (HOSPITAL_COMMUNITY)
Admission: RE | Admit: 2021-08-16 | Discharge: 2021-08-16 | Disposition: A | Discharge status: Home / Self Care | Payer: Medicare Other | Source: Ambulatory Visit | Attending: Vascular Surgery | Admitting: Vascular Surgery

## 2021-08-16 ENCOUNTER — Ambulatory Visit: Payer: Medicare Other | Admitting: Physician Assistant

## 2021-08-16 ENCOUNTER — Other Ambulatory Visit: Payer: Self-pay

## 2021-08-16 VITALS — BP 110/62 | HR 69 | Temp 97.2°F | Resp 16 | Ht 64.0 in

## 2021-08-16 DIAGNOSIS — I6523 Occlusion and stenosis of bilateral carotid arteries: Secondary | ICD-10-CM

## 2021-08-16 DIAGNOSIS — I7143 Infrarenal abdominal aortic aneurysm, without rupture: Secondary | ICD-10-CM | POA: Diagnosis not present

## 2021-08-16 DIAGNOSIS — I6529 Occlusion and stenosis of unspecified carotid artery: Secondary | ICD-10-CM | POA: Diagnosis not present

## 2021-08-16 NOTE — Progress Notes (Signed)
HISTORY AND PHYSICAL     CC:  follow up. Requesting Provider:  Lawerance Cruel, MD  HPI: This is a 70 y.o. female here for follow up for carotid artery stenosis.  Pt is s/p right CEA for symptomatic carotid artery stenosis on 01/17/2014 by Dr. Donnetta Hutching.    Pt was last seen December 2019 and at that time she was doing well without residual neurological effects.    Pt returns today for follow up.    Pt denies any amaurosis fugax, speech difficulties, weakness, numbness, paralysis or clumsiness.  She states that she does have some residual facial droop occasionally that has been present since her stroke.  She states that she was having some abdominal pain and was taken to the ER for sharp pain in between her shoulders that radiated into her chest.  Her EKG was normal as well as troponins were negative.  During that workup, she was found to have a AAA that measured 3cm.  She states that her mother had a AAA that was repaired by Dr. Donnetta Hutching.  She has not had any sharp debilitating abdominal or back pain.   Her statin has been changed a couple of times due to myalgias and they are monitoring her liver function.     The pt is on a statin for cholesterol management.  The pt is not on a daily aspirin.   Other AC:  Plavix The pt is on BB, CCB, ACEI for hypertension.   The pt is not diabetic.   Tobacco hx:  former  Pt does  have family hx of AAA.  She does have two children in their 3's.   Past Medical History:  Diagnosis Date   Allergic rhinitis    Anemia    per hosp. - recently- pt. told that she is anemic   Anxiety    Colitis    COPD (chronic obstructive pulmonary disease) ([place])    no o2   Depression    Diverticulosis    Female bladder prolapse    Fibroids    GERD (gastroesophageal reflux disease)    Heart murmur    Hx of adenomatous colonic polyps 08/26/2019   Hyperlipemia    Hypertension    IBS (irritable bowel syndrome)    Insomnia    Lip abscess 04/05/2016   Mitral valve  prolapse    Peripheral vascular disease ([place])    Pre-diabetes 08/03/2019   Diet controlled     Right bundle branch block    Sciatic nerve disease    Shortness of breath    Stroke [facility]) March 2015    Past Surgical History:  Procedure Laterality Date   CAROTID ENDARTERECTOMY Right January 17, 2014   CE   CHOLECYSTECTOMY     COLONOSCOPY     ENDARTERECTOMY Right 01/17/2014   Procedure: RIGHT CAROTID ARTERY ENDARTERECTOMY WITH DACRON PATCH ANGIOPLASTY;  Surgeon: Rosetta Posner, MD;  Location[facility];  Service: Vascular;  Laterality: Right;   UPPER GASTROINTESTINAL ENDOSCOPY      Allergies  Allergen Reactions   Carvedilol Palpitations   Buspirone Other (See Comments)    Reaction not recalled   Dapsone Other (See Comments)    Flushing    Niacin And Related Other (See Comments)    Flushing    Septra [Sulfamethoxazole-Trimethoprim] Diarrhea and Nausea And Vomiting   Amoxicillin Rash and Other (See Comments)    Has patient had a PCN reaction causing immediate rash, facial/tongue/throat swelling, SOB or lightheadedness with hypotension: No Has patient  had a PCN reaction causing severe rash involving mucus membranes or skin necrosis: No Has patient had a PCN reaction that required hospitalization No Has patient had a PCN reaction occurring within the last 10 years: No If all of the above answers are "NO", then may proceed with Cephalosporin use.   Wellbutrin [Bupropion] Anxiety and Other (See Comments)    Caused the patient to feel "jittery"    Current Outpatient Medications  Medication Sig Dispense Refill   acetaminophen (TYLENOL) 500 MG tablet Take 500-1,000 mg by mouth every 6 (six) hours as needed for mild pain, moderate pain, fever or headache.     amLODipine (NORVASC) 5 MG tablet Take 5 mg by mouth daily.     atenolol (TENORMIN) 50 MG tablet Take 50 mg by mouth daily.     Cholecalciferol (VITAMIN D3) 5000 units CAPS Take 5,000 Units by mouth every evening.      clopidogrel (PLAVIX)  75 MG tablet Take 75 mg by mouth daily.      DULoxetine (CYMBALTA) 30 MG capsule Take 90 mg by mouth daily at 12 noon. Patient take 3 tablets daily.     ferrous sulfate 325 (65 FE) MG tablet Take 325 mg by mouth daily with breakfast.     fluticasone (FLONASE) 50 MCG/ACT nasal spray Place 1 spray into both nostrils daily.     Ipratropium-Albuterol (COMBIVENT RESPIMAT) 20-100 MCG/ACT AERS respimat Inhale 1 puff into the lungs every 4 (four) hours as needed for wheezing or shortness of breath. 4 g 11   ipratropium-albuterol (DUONEB) 0.5-2.5 (3) MG/3ML SOLN Take 3 mLs by nebulization every 6 (six) hours as needed (for wheezing/shortness of breath).     isosorbide mononitrate (IMDUR) 30 MG 24 hr tablet Take 30 mg by mouth in the morning and at bedtime.  0   lisinopril (PRINIVIL,ZESTRIL) 40 MG tablet Take 40 mg by mouth daily.     LORazepam (ATIVAN) 0.5 MG tablet Take 0.5 mg by mouth See admin instructions. Take 0.5 mg by mouth at bedtime and an additional 0.5 mg once a day as needed for anxiety  0   pantoprazole (PROTONIX) 40 MG tablet Take 40 mg by mouth in the morning and at bedtime.     simvastatin (ZOCOR) 40 MG tablet Take 40 mg by mouth daily.     traMADol (ULTRAM) 50 MG tablet Take 50 mg by mouth every 6 (six) hours as needed for moderate pain.      umeclidinium-vilanterol (ANORO ELLIPTA) 62.5-25 MCG/ACT AEPB INHALE 1 PUFF INTO THE LUNGS DAILY 60 each 6   No current facility-administered medications for this visit.    Family History  Problem Relation Age of Onset   Breast cancer Mother    Ovarian cancer Mother    AAA (abdominal aortic aneurysm) Mother    Emphysema Mother    Lung cancer Father    Emphysema Paternal Aunt    Pancreatic cancer Maternal Grandmother    Diabetes Maternal Aunt    Breast cancer Maternal Aunt    Heart disease Paternal Grandfather    Diabetes Paternal Grandfather    Kidney disease Maternal Aunt    Colon cancer Paternal Aunt    Colon polyps Neg Hx    Esophageal  cancer Neg Hx    Rectal cancer Neg Hx    Stomach cancer Neg Hx     Social History   Socioeconomic History   Marital status: Divorced    Spouse name: Not on file   Number of children: 2  Years of education: ASSOCIATES   Highest education level: Not on file  Occupational History   Occupation: Bookeeper  Tobacco Use   Smoking status: Former    Packs/day: 1.50    Years: 47.00    Pack years: 70.50    Types: Cigarettes    Start date: 12/03/1966    Quit date: 01/02/2014    Years since quitting: 7.6   Smokeless tobacco: Never   Tobacco comments:    Stopped for 6 months once in 2007  Vaping Use   Vaping Use: Never used  Substance and Sexual Activity   Alcohol use: No    Alcohol/week: 0.0 standard drinks   Drug use: No   Sexual activity: Not Currently  Other Topics Concern   Not on file  Social History Narrative   Patient is divorced with 2 children.   Patient is right handed.   Patient has her Associates degree.   Patient drinks 3 cups daily.      [facility] (07/30/17):   Originally from [facility]. Has always lived in [place]. 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 Previously had cockatoo and a [place] for months. Did have mold in her current home that was remediated. Enjoys playing on the computer, reading, & babysitting.     Social Determinants of Health   Financial Resource Strain: Not on file  Food Insecurity: Not on file  Transportation Needs: Not on file  Physical Activity: Not on file  Stress: Not on file  Social Connections: Not on file  Intimate Partner Violence: Not on file     REVIEW OF SYSTEMS:   [X]  denotes positive finding, [ ]  denotes negative finding Cardiac  Comments:  Chest pain or chest pressure:    Shortness of breath upon exertion:    Short of breath when lying flat:    Irregular heart rhythm:        Vascular    Pain in calf, thigh, or hip brought on by ambulation:    Pain in feet at night that wakes you up from your  sleep:     Blood clot in your veins:    Leg swelling:         Pulmonary    Oxygen at home:    Productive cough:     Wheezing:         Neurologic    Sudden weakness in arms or legs:     Sudden numbness in arms or legs:     Sudden onset of difficulty speaking or slurred speech:    Temporary loss of vision in one eye:     Problems with dizziness:         Gastrointestinal    Blood in stool:     Vomited blood:         Genitourinary    Burning when urinating:     Blood in urine:        Psychiatric    Major depression:         Hematologic    Bleeding problems:    Problems with blood clotting too easily:        Skin    Rashes or ulcers:        Constitutional    Fever or chills:      PHYSICAL EXAMINATION:  Today's Vitals   08/16/21 1343 08/16/21 1344  BP: 110/62   Pulse: 69   Resp: 16   Temp: (!) 97.2 F (36.2 C)   TempSrc: Temporal  SpO2: 95%   Height: 5\' 4"  (1.626 m)   PainSc: 0-No pain 0-No pain   Body mass index is 32.1 kg/m.   General:  WDWN in NAD; vital signs documented above Gait: Not observed HENT: WNL, normocephalic Pulmonary: normal non-labored breathing Cardiac: regular HR, without carotid bruits Abdomen: soft, NT; aortic pulse is not palpable Skin: without rashes Vascular Exam/Pulses:  Right Left  Radial 2+ (normal) 2+ (normal)  Popliteal Unable to palpate Unable to palpate  DP 2+ (normal) 2+ (normal)  PT Unable to palpate Unable to palpate   Extremities: without ischemic changes, without Gangrene , without cellulitis; without open wounds Musculoskeletal: no muscle wasting or atrophy  Neurologic: A&O X 3; moving all extremities equally; speech is fluent/normal Psychiatric:  The pt has Normal affect.   Non-Invasive Vascular Imaging:   Carotid Duplex on 08/16/2021: Right:  1-39% ICA stenosis Left:  1-39% ICA stenosis Vertebrals:  Bilateral vertebral arteries demonstrate antegrade flow.  Subclavians: Normal flow hemodynamics were seen  in bilateral subclavian arteries.   Previous Carotid duplex on 10/09/2018: Right: 1-39% ICA stenosis Left:   1-39% ICA stenosis    ASSESSMENT/PLAN:: 70 y.o. female here for follow up carotid artery stenosis and is s/p right CEA for symptomatic carotid artery stenosis on 01/02/2014 by Dr. Donnetta Hutching.  Carotid stenosis -duplex today reveals 1-39% bilateral ICA stenosis -discussed s/s of stroke with pt and she understands should she develop any of these sx, she will go to the nearest ER or call 911. -pt will f/u in 18 months with carotid duplex -pt will call sooner should they have any issues. -continue statin/plavix  AAA -pt recently had incidental finding of AAA measuring 3cm.  Her mother had AAA that was repaired by Dr. Donnetta Hutching in the past.  Her blood pressure is well controlled and she has not smoked since 2015.   -we will have her return in one year for AAA duplex.   -discussed with her that her aneurysm is small and although the risk of rupture is not zero, it is very low.  She knows to call 911 should she develop sudden, severe onset of back or abdominal pain.  Also discussed with her given her family hx that her children will need to be evaluated at some point in the future.  They are currently in their 40's.    -she will call sooner if she has any issues before then.    Leontine Locket, [facility] [phone]  Clinic MD:  Scot Dock

## 2021-08-17 ENCOUNTER — Other Ambulatory Visit (INDEPENDENT_AMBULATORY_CARE_PROVIDER_SITE_OTHER): Payer: Medicare Other

## 2021-08-17 DIAGNOSIS — R7989 Other specified abnormal findings of blood chemistry: Secondary | ICD-10-CM

## 2021-08-17 LAB — HEPATIC FUNCTION PANEL
ALT: 15 U/L (ref 0–35)
AST: 18 U/L (ref 0–37)
Albumin: 4.3 g/dL (ref 3.5–5.2)
Alkaline Phosphatase: 63 U/L (ref 39–117)
Bilirubin, Direct: 0.1 mg/dL (ref 0.0–0.3)
Total Bilirubin: 0.7 mg/dL (ref 0.2–1.2)
Total Protein: 7.7 g/dL (ref 6.0–8.3)

## 2021-08-23 DIAGNOSIS — J432 Centrilobular emphysema: Secondary | ICD-10-CM | POA: Diagnosis not present

## 2021-09-17 ENCOUNTER — Encounter: Payer: Self-pay | Admitting: Pulmonary Disease

## 2021-09-17 NOTE — Telephone Encounter (Signed)
JD please advise on results.  thanks

## 2021-09-24 ENCOUNTER — Telehealth: Payer: Self-pay | Admitting: Pulmonary Disease

## 2021-09-24 NOTE — Telephone Encounter (Signed)
JD please advise on the results of the ONO for the pt.  Thanks

## 2021-09-25 NOTE — Telephone Encounter (Signed)
Spoke to [place] with [facility] and requested ONO to be faxed.

## 2021-10-09 NOTE — Telephone Encounter (Signed)
Dr. Erin Fulling, please advise if you ever received results of pt's ONO.

## 2021-10-10 NOTE — Telephone Encounter (Signed)
Called and spoke with pt letting her know the results of the ONO and she verbalized understanding. Nothing further needed. 

## 2021-10-10 NOTE — Telephone Encounter (Signed)
Shannon Starr, MD  You 9 hours ago (10:55 PM)   She did not have SpO2 saturations below 88% during the study. She does not require supplemental oxygen when sleeping at night.   JD     Attempted to call pt but unable to reach. Left message for her to return call.

## 2021-10-30 DIAGNOSIS — H25811 Combined forms of age-related cataract, right eye: Secondary | ICD-10-CM | POA: Diagnosis not present

## 2021-10-30 DIAGNOSIS — H25812 Combined forms of age-related cataract, left eye: Secondary | ICD-10-CM | POA: Diagnosis not present

## 2021-11-13 ENCOUNTER — Telehealth: Payer: Self-pay | Admitting: Pulmonary Disease

## 2021-11-14 NOTE — Telephone Encounter (Signed)
Attempted to call pt but unable to reach. Left message for her to return call. 

## 2021-11-14 NOTE — Telephone Encounter (Signed)
Called and spoke with pt letting her know that after the ONO was reviewed by JD, it was sent to scan. Stated to her that we do not have the report and are still waiting for it to be scanned into her chart. Stated to pt the best thing to do would be to try to call back later to see if it has been scanned in yet and she verbalized understanding. Nothing further needed.

## 2021-12-07 ENCOUNTER — Ambulatory Visit: Payer: Medicare Other | Admitting: Cardiovascular Disease

## 2021-12-12 NOTE — Progress Notes (Signed)
Cardiology Office Note:   Date:  12/13/2021  NAME:  Shannon Roberts    MRN: [id number] DOB:  09-04-1951   PCP:  Lawerance Cruel, MD  Cardiologist:  None  Electrophysiologist:  None   Referring MD: Lawerance Cruel, MD   Chief Complaint  Patient presents with   Follow-up    6 months.    History of Present Illness:   Shannon Roberts is a 71 y.o. female with a hx of non-obstructive CAD, carotid artery disease, L renal arter stenosis, severe COPD, hyperlipidemia who presents for follow-up.  She was challenged on Lipitor at her last visit.  She apparently developed acute hepatitis.  She was seen in the emergency room in September 2022.  It appears gastroenterology was not convinced this was all related to Lipitor.  We discussed going on Repatha.  I think she should avoid high intensity statin moving forward.  She is in agreement.  Cholesterol was not controlled last year.  Needs to get much lower given her severe PAD as well as CAD.  She reports no further chest pain or pressure.  She is still short of breath with activity.  She has severe COPD.  She quit smoking in 2015.  She is on Plavix due to aspirin allergy.  Blood pressure 116/66.  Denies any chest pain or pressure in office today.  We discussed the results of her coronary CTA.  Diffuse disease.  Can be treated medically.  Echocardiogram shows normal LV function.  Problem List COPD AAA -3 cm (CTA 05/26/2021) 3. Moderate L renal artery stenosis  -CTA 05/26/2021 4. CVA -R CEA 01/17/2014  -R/L ICA 1-39% 07/2021 5. Aspirin intolerant  6. HTN 7. HLD -Total cholesterol 158, HDL 40, LDL 90, triglycerides 134 -acute liver enzyme elevation due to lipitor 8. CAD -coronary calcium score 1026 (96th percentile) -50-69% mid LCX (CT FFR 0.81 negative) -Distal LCX 0.67 (delta 0.13) 9. COPD -severe  -quit smoking 2015  Past Medical History: Past Medical History:  Diagnosis Date   Allergic rhinitis    Anemia    per hosp. - recently-  pt. told that she is anemic   Anxiety    Colitis    COPD (chronic obstructive pulmonary disease) ([place])    no o2   Depression    Diverticulosis    Female bladder prolapse    Fibroids    GERD (gastroesophageal reflux disease)    Heart murmur    Hx of adenomatous colonic polyps 08/26/2019   Hyperlipemia    Hypertension    IBS (irritable bowel syndrome)    Insomnia    Lip abscess 04/05/2016   Mitral valve prolapse    Peripheral vascular disease ([place])    Pre-diabetes 08/03/2019   Diet controlled     Right bundle branch block    Sciatic nerve disease    Shortness of breath    Stroke [facility]) March 2015    Past Surgical History: Past Surgical History:  Procedure Laterality Date   CAROTID ENDARTERECTOMY Right January 17, 2014   CE   CHOLECYSTECTOMY     COLONOSCOPY     ENDARTERECTOMY Right 01/17/2014   Procedure: RIGHT CAROTID ARTERY ENDARTERECTOMY WITH DACRON PATCH ANGIOPLASTY;  Surgeon: Rosetta Posner, MD;  Location: [facility] OR;  Service: Vascular;  Laterality: Right;   UPPER GASTROINTESTINAL ENDOSCOPY      Current Medications: Current Meds  Medication Sig   acetaminophen (TYLENOL) 500 MG tablet Take 500-1,000 mg by mouth every 6 (six) hours as needed  for mild pain, moderate pain, fever or headache.   amLODipine (NORVASC) 5 MG tablet Take 5 mg by mouth daily.   atenolol (TENORMIN) 50 MG tablet Take 50 mg by mouth daily.   Cholecalciferol (VITAMIN D3) 5000 units CAPS Take 5,000 Units by mouth every evening.    clopidogrel (PLAVIX) 75 MG tablet Take 75 mg by mouth daily.    DULoxetine (CYMBALTA) 30 MG capsule Take 90 mg by mouth daily at 12 noon. Patient take 3 tablets daily.   ferrous sulfate 325 (65 FE) MG tablet Take 325 mg by mouth daily with breakfast.   fluticasone (FLONASE) 50 MCG/ACT nasal spray Place 1 spray into both nostrils daily.   Fluticasone-Salmeterol (ADVAIR DISKUS IN) Inhale into the lungs.   Ipratropium-Albuterol (COMBIVENT RESPIMAT) 20-100 MCG/ACT AERS respimat Inhale 1  puff into the lungs every 4 (four) hours as needed for wheezing or shortness of breath.   ipratropium-albuterol (DUONEB) 0.5-2.5 (3) MG/3ML SOLN Take 3 mLs by nebulization every 6 (six) hours as needed (for wheezing/shortness of breath).   isosorbide mononitrate (IMDUR) 30 MG 24 hr tablet Take 30 mg by mouth in the morning and at bedtime.   lisinopril (PRINIVIL,ZESTRIL) 40 MG tablet Take 40 mg by mouth daily.   LORazepam (ATIVAN) 0.5 MG tablet Take 0.5 mg by mouth See admin instructions. Take 0.5 mg by mouth at bedtime and an additional 0.5 mg once a day as needed for anxiety   pantoprazole (PROTONIX) 40 MG tablet Take 40 mg by mouth in the morning and at bedtime.   simvastatin (ZOCOR) 40 MG tablet Take 40 mg by mouth daily.   traMADol (ULTRAM) 50 MG tablet Take 50 mg by mouth every 6 (six) hours as needed for moderate pain.    [DISCONTINUED] umeclidinium-vilanterol (ANORO ELLIPTA) 62.5-25 MCG/ACT AEPB INHALE 1 PUFF INTO THE LUNGS DAILY     Allergies:    Carvedilol, Buspirone, Dapsone, Niacin and related, Septra [sulfamethoxazole-trimethoprim], Amoxicillin, and Wellbutrin [bupropion]   Social History: Social History   Socioeconomic History   Marital status: Divorced    Spouse name: Not on file   Number of children: 2   Years of education: ASSOCIATES   Highest education level: Not on file  Occupational History   Occupation: Therapist, sports  Tobacco Use   Smoking status: Former    Packs/day: 1.50    Years: 47.00    Pack years: 70.50    Types: Cigarettes    Start date: 12/03/1966    Quit date: 01/02/2014    Years since quitting: 7.9   Smokeless tobacco: Never   Tobacco comments:    Stopped for 6 months once in 2007  Vaping Use   Vaping Use: Never used  Substance and Sexual Activity   Alcohol use: No    Alcohol/week: 0.0 standard drinks   Drug use: No   Sexual activity: Not Currently  Other Topics Concern   Not on file  Social History Narrative   Patient is divorced with 2  children.   Patient is right handed.   Patient has her Associates degree.   Patient drinks 3 cups daily.      [facility] (07/30/17):   Originally from [facility]. Has always lived in [place]. 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 Previously had cockatoo and a [place] for months. Did have mold in her current home that was remediated. Enjoys playing on the computer, reading, & babysitting.     Social Determinants of Health   Financial Resource Strain: Not on  file  Food Insecurity: Not on file  Transportation Needs: Not on file  Physical Activity: Not on file  Stress: Not on file  Social Connections: Not on file     Family History: The patient's family history includes AAA (abdominal aortic aneurysm) in her mother; Breast cancer in her maternal aunt and mother; Colon cancer in her paternal aunt; Diabetes in her maternal aunt and paternal grandfather; Emphysema in her mother and paternal aunt; Heart disease in her paternal grandfather; Kidney disease in her maternal aunt; Lung cancer in her father; Ovarian cancer in her mother; Pancreatic cancer in her maternal grandmother. There is no history of Colon polyps, Esophageal cancer, Rectal cancer, or Stomach cancer.  ROS:   All other ROS reviewed and negative. Pertinent positives noted in the HPI.     EKGs/Labs/Other Studies Reviewed:   The following studies were personally reviewed by me today:   TTE 06/21/2021   1. Left ventricular ejection fraction, by estimation, is 60 to 65%. The  left ventricle has normal function. The left ventricle has no regional  wall motion abnormalities. Left ventricular diastolic parameters are  consistent with Grade I diastolic  dysfunction (impaired relaxation).   2. Right ventricular systolic function is normal. The right ventricular  size is normal. Tricuspid regurgitation signal is inadequate for assessing  PA pressure.   3. The mitral valve is normal in structure. Trivial mitral valve   regurgitation. No evidence of mitral stenosis.   4. The aortic valve is tricuspid. Aortic valve regurgitation is not  visualized. Mild aortic valve sclerosis is present, with no evidence of  aortic valve stenosis.   5. The inferior vena cava is normal in size with greater than 50%  respiratory variability, suggesting right atrial pressure of 3 mmHg.   6. Trivial pericardial effusion anterior to the right ventricle.   CCTA 06/13/2021 IMPRESSION: 1. Coronary calcium score of 1026. This was 20 percentile for age-, sex, and race-matched controls.   2. Normal coronary origin with left dominance.   3. Moderate (50-69) CAD in left circumflex; otherwise mild nonobstructive disease.   4. Aortic atherosclerosis.   5. Study will be sent for FFR.     Recent Labs: 06/26/2021: BUN 22; Creatinine, Ser 1.56; Hemoglobin 11.0; Platelets 200; Potassium 4.7; Sodium 134 08/17/2021: ALT 15   Recent Lipid Panel    Component Value Date/Time   CHOL 158 01/02/2014 2255   TRIG 99 01/02/2014 2255   HDL 47 01/02/2014 2255   CHOLHDL 3.4 01/02/2014 2255   VLDL 20 01/02/2014 2255   LDLCALC 91 01/02/2014 2255    Physical Exam:   VS:  BP 116/66 (BP Location: Left Arm, Patient Position: Sitting, Cuff Size: Normal)    Pulse 74    Ht 5\' 4"  (1.626 m)    Wt 184 lb (83.5 kg)    BMI 31.58 kg/m    Wt Readings from Last 3 Encounters:  12/13/21 184 lb (83.5 kg)  08/07/21 187 lb (84.8 kg)  07/27/21 188 lb (85.3 kg)    General: Well nourished, well developed, in no acute distress Head: Atraumatic, normal size  Eyes: PEERLA, EOMI  Neck: Supple, no JVD Endocrine: No thryomegaly Cardiac: Normal S1, S2; RRR; no murmurs, rubs, or gallops Lungs: Clear to auscultation bilaterally, no wheezing, rhonchi or rales  Abd: Soft, nontender, no hepatomegaly  Ext: Poor pulse in the right lower extremity Musculoskeletal: No deformities, BUE and BLE strength normal and equal Skin: Warm and dry, no rashes   Neuro: Alert and  oriented to person, place, time, and situation, CNII-XII grossly intact, no focal deficits  Psych: Normal mood and affect   ASSESSMENT:   Shannon Roberts is a 71 y.o. female who presents for the following: 1. Coronary artery disease involving native coronary artery of native heart without angina pectoris   2. Agatston coronary artery calcium score greater than 400   3. Mixed hyperlipidemia   4. Bilateral carotid artery disease, unspecified type ([place])   5. Infrarenal abdominal aortic aneurysm (AAA) without rupture   6. Primary hypertension     PLAN:   1. Coronary artery disease involving native coronary artery of native heart without angina pectoris 2. Agatston coronary artery calcium score greater than 400 3. Mixed hyperlipidemia -Recent coronary CTA with elevated calcium score 1026.  96 percentile.  Moderate mid circumflex disease which is negative by CT FFR 0.1.  She had diffuse distal disease 0.67 but this is likely due to tapering.  Echocardiogram shows normal LV function.  No further chest pain symptoms.  I believe her CAD can be managed medically.  She is on Plavix due to aspirin allergy.  She had a recent hepatitis issue when starting Lipitor.  We will not pursue any further high intensity statins.  I have recommended PCSK9 inhibitor therapy.  I believe she will benefit immensely from this.  She will continue simvastatin in the interim.  Liver enzymes have returned to normal.  She is on amlodipine 5 mg daily as well as atenolol.  Also on Imdur 30 mg daily again no symptoms of angina.  4. Bilateral carotid artery disease, unspecified type ([place]) -Status post right CEA.  Minimal carotid artery disease bilaterally in 2022.  We will repeat carotid ultrasounds next year.  5. Infrarenal abdominal aortic aneurysm (AAA) without rupture -3 cm AAA.  Repeat ultrasound next year.  She did have imaging this year.  6. Primary hypertension -Well-controlled.  Continue amlodipine 5 mg daily, atenolol  50 mg daily, lisinopril 40 mg daily, Imdur 30 mg daily.  Disposition: Return in about 1 year (around 12/13/2022).  Medication Adjustments/Labs and Tests Ordered: Current medicines are reviewed at length with the patient today.  Concerns regarding medicines are outlined above.  Orders Placed This Encounter  Procedures   AMB Referral to [facility]   VAS US CAROTID   VAS [place] AAA DUPLEX   No orders of the defined types were placed in this encounter.   Patient Instructions  Medication Instructions:  The current medical regimen is effective;  continue present plan and medications.  *If you need a refill on your cardiac medications before your next appointment, please call your pharmacy*   Testing/Procedures: Your physician has requested that you have a carotid duplex (12 months). This test is an ultrasound of the carotid arteries in your neck. It looks at blood flow through these arteries that supply the brain with blood. Allow one hour for this exam. There are no restrictions or special instructions.  Your physician has requested that you have an abdominal aorta duplex. During this test, an ultrasound is used to evaluate the aorta. Allow 30 minutes for this exam. Do not eat after midnight the day before and avoid carbonated beverages   Follow-Up: At [facility], you and your health needs are our priority.  As part of our continuing mission to provide you with exceptional heart care, we have created designated Provider Care Teams.  These Care Teams include your primary Cardiologist (physician) and Advanced Practice Providers (APPs -  Physician Assistants  and Nurse Practitioners) who all work together to provide you with the care you need, when you need it.  We recommend signing up for the patient portal called "MyChart".  Sign up information is provided on this After Visit Summary.  MyChart is used to connect with patients for Virtual Visits (Telemedicine).  Patients are able to view  lab/test results, encounter notes, upcoming appointments, etc.  Non-urgent messages can be sent to your provider as well.   To learn more about what you can do with MyChart, go to [email or web address].    Your next appointment:   12 month(s)  The format for your next appointment:   In Person  Provider:   Eleonore Chiquito, MD    Other Instructions Referral to [facility]- they will contact you to set up an appointment     Time Spent with Patient: I have spent a total of 35 minutes with patient reviewing hospital notes, telemetry, EKGs, labs and examining the patient as well as establishing an assessment and plan that was discussed with the patient.  > 50% of time was spent in direct patient care.  Signed, Addison Naegeli. Audie Box, MD, [place]  [address], [place], [place] [address] [phone]  12/13/2021 4:02 PM

## 2021-12-13 ENCOUNTER — Ambulatory Visit: Payer: Medicare Other | Admitting: Cardiovascular Disease

## 2021-12-13 ENCOUNTER — Other Ambulatory Visit: Payer: Self-pay

## 2021-12-13 ENCOUNTER — Encounter: Payer: Self-pay | Admitting: Cardiovascular Disease

## 2021-12-13 VITALS — BP 116/66 | HR 74 | Ht 64.0 in | Wt 184.0 lb

## 2021-12-13 DIAGNOSIS — I779 Disorder of arteries and arterioles, unspecified: Secondary | ICD-10-CM | POA: Diagnosis not present

## 2021-12-13 DIAGNOSIS — E782 Mixed hyperlipidemia: Secondary | ICD-10-CM

## 2021-12-13 DIAGNOSIS — I10 Essential (primary) hypertension: Secondary | ICD-10-CM

## 2021-12-13 DIAGNOSIS — I7143 Infrarenal abdominal aortic aneurysm, without rupture: Secondary | ICD-10-CM

## 2021-12-13 DIAGNOSIS — I2510 Atherosclerotic heart disease of native coronary artery without angina pectoris: Secondary | ICD-10-CM

## 2021-12-13 DIAGNOSIS — R931 Abnormal findings on diagnostic imaging of heart and coronary circulation: Secondary | ICD-10-CM

## 2021-12-13 NOTE — Patient Instructions (Signed)
Medication Instructions:  The current medical regimen is effective;  continue present plan and medications.  *If you need a refill on your cardiac medications before your next appointment, please call your pharmacy*   Testing/Procedures: Your physician has requested that you have a carotid duplex (12 months). This test is an ultrasound of the carotid arteries in your neck. It looks at blood flow through these arteries that supply the brain with blood. Allow one hour for this exam. There are no restrictions or special instructions.  Your physician has requested that you have an abdominal aorta duplex. During this test, an ultrasound is used to evaluate the aorta. Allow 30 minutes for this exam. Do not eat after midnight the day before and avoid carbonated beverages   Follow-Up: At [facility], you and your health needs are our priority.  As part of our continuing mission to provide you with exceptional heart care, we have created designated Provider Care Teams.  These Care Teams include your primary Cardiologist (physician) and Advanced Practice Providers (APPs -  Physician Assistants and Nurse Practitioners) who all work together to provide you with the care you need, when you need it.  We recommend signing up for the patient portal called "MyChart".  Sign up information is provided on this After Visit Summary.  MyChart is used to connect with patients for Virtual Visits (Telemedicine).  Patients are able to view lab/test results, encounter notes, upcoming appointments, etc.  Non-urgent messages can be sent to your provider as well.   To learn more about what you can do with MyChart, go to [email or web address].    Your next appointment:   12 month(s)  The format for your next appointment:   In Person  Provider:   Eleonore Chiquito, MD    Other Instructions Referral to [facility]- they will contact you to set up an appointment

## 2022-01-28 ENCOUNTER — Ambulatory Visit: Payer: Medicare Other | Admitting: Pharmacist Clinician (PhC)/ Clinical Pharmacy Specialist

## 2022-01-28 DIAGNOSIS — E782 Mixed hyperlipidemia: Secondary | ICD-10-CM

## 2022-01-28 NOTE — Progress Notes (Signed)
01/29/2022 ?[name] ?Sep 06, 1951 ?[id number] ? ? ?HPI:  Shannon Roberts is a 71 y.o. female patient of Dr Audie Box, who presents today for a lipid clinic evaluation.  See pertinent past medical history below.  Patient had been on simvastatin for some time, in varying doses, but this was switched to atorvastatin for better lipid control.  Unfortunately after starting this she had a bout of lower back pain and on ED visit was found to have elevated liver enzymes.  Hepatitis was ruled out and she followed with GI.  She discontinued atorvastatin and went back to her simvastatin 40 mg.  She is scheduled for cataract surgery later this week.   ? ?Past Medical History: ?CAD ECA > 50% stenosed  ?RAS Left RAS  ?COPD On Combivent, Advair and DuoNeb  ?stroke 12/2013 - non-hemorrhagic [place] infarct involving right frontal lobe  ? ? ?Current Medications: simvastatin 40 mg qd ? ?Cholesterol Goals: LDL < 70 ?  ?Intolerant/previously tried: atorvastatin, simvastatin - elevated liver enzymes ? ?Family history: mother with AAA, breast cancer, emphysema, TIA's, died at 72;  father with lung cancer at 17; no siblings, children 38, 32 - son with elevated cholesterol ? ?Diet: all home cooked foods, no added salt, nothing fried; plenty of vegetables - not canned; protein is mostly chicken, rarely beef;  ? ?Exercise:  none ? ?Labs:  4/22: TC 158, TG 134, HDL 40, LDL 94 ? ? ?Current Outpatient Medications  ?Medication Sig Dispense Refill  ? amLODipine (NORVASC) 5 MG tablet Take 5 mg by mouth daily.    ? atenolol (TENORMIN) 50 MG tablet Take 50 mg by mouth daily.    ? Cholecalciferol (VITAMIN D3) 5000 units CAPS Take 5,000 Units by mouth every evening.     ? clopidogrel (PLAVIX) 75 MG tablet Take 75 mg by mouth daily.     ? DULoxetine (CYMBALTA) 30 MG capsule Take 60 mg by mouth daily at 12 noon. Patient take 2 tablets daily.    ? ferrous sulfate 325 (65 FE) MG tablet Take 325 mg by mouth daily with breakfast.    ?  fluticasone (FLONASE) 50 MCG/ACT nasal spray Place 1 spray into both nostrils daily.    ? Fluticasone-Salmeterol (ADVAIR DISKUS IN) Inhale into the lungs.    ? Ipratropium-Albuterol (COMBIVENT RESPIMAT) 20-100 MCG/ACT AERS respimat Inhale 1 puff into the lungs every 4 (four) hours as needed for wheezing or shortness of breath. 4 g 11  ? isosorbide mononitrate (IMDUR) 30 MG 24 hr tablet Take 30 mg by mouth in the morning and at bedtime.  0  ? lisinopril (PRINIVIL,ZESTRIL) 40 MG tablet Take 40 mg by mouth daily.    ? LORazepam (ATIVAN) 0.5 MG tablet Take 0.5 mg by mouth See admin instructions. Take 0.5 mg by mouth at bedtime and an additional 0.5 mg once a day as needed for anxiety  0  ? pantoprazole (PROTONIX) 40 MG tablet Take 40 mg by mouth in the morning and at bedtime.    ? simvastatin (ZOCOR) 40 MG tablet Take 40 mg by mouth daily.    ? ?No current facility-administered medications for this visit.  ? ? ?Allergies  ?Allergen Reactions  ? Carvedilol Palpitations  ? Buspirone Other (See Comments)  ?  Reaction not recalled  ? Dapsone Other (See Comments)  ?  Flushing   ? Niacin And Related Other (See Comments)  ?  Flushing   ? Septra [Sulfamethoxazole-Trimethoprim] Diarrhea and Nausea And Vomiting  ? Amoxicillin Rash and Other (  See Comments)  ?  Has patient had a PCN reaction causing immediate rash, facial/tongue/throat swelling, SOB or lightheadedness with hypotension: No ?Has patient had a PCN reaction causing severe rash involving mucus membranes or skin necrosis: No ?Has patient had a PCN reaction that required hospitalization No ?Has patient had a PCN reaction occurring within the last 10 years: No ?If all of the above answers are "NO", then may proceed with Cephalosporin use.  ? Wellbutrin [Bupropion] Anxiety and Other (See Comments)  ?  Caused the patient to feel "jittery"  ? ? ?Past Medical History:  ?Diagnosis Date  ? Allergic rhinitis   ? Anemia   ? per hosp. - recently- pt. told that she is anemic  ?  Anxiety   ? Colitis   ? COPD (chronic obstructive pulmonary disease) ([place])   ? no o2  ? Depression   ? Diverticulosis   ? Female bladder prolapse   ? Fibroids   ? GERD (gastroesophageal reflux disease)   ? Heart murmur   ? Hx of adenomatous colonic polyps 08/26/2019  ? Hyperlipemia   ? Hypertension   ? IBS (irritable bowel syndrome)   ? Insomnia   ? Lip abscess 04/05/2016  ? Mitral valve prolapse   ? Peripheral vascular disease ([place])   ? Pre-diabetes 08/03/2019  ? Diet controlled    ? Right bundle branch block   ? Sciatic nerve disease   ? Shortness of breath   ? Stroke [facility] March 2015  ? ? ?There were no vitals taken for this visit. ? ? ?Hyperlipidemia ?Patient with hyperlipidemia, LDL goal < 70, currently not at goal on simvastatin 40 mg.  Her labs are just a year old, she notes that her primary draws them yearly (C. Melinda Crutch at [place]).  She is due to have this done later this month.  Reviewed options for lowering LDL cholesterol, including ezetimibe, PCSK-9 inhibitors, bempedoic acid and inclisiran.  Discussed mechanisms of action, dosing, side effects and potential decreases in LDL cholesterol.  Also reviewed cost information and potential options for patient assistance.  Answered all patient questions.  We agreed that once her labs result, we will talk on the phone to determine which medication will be best for her.  Labs ordered, she will show to Dr. Harrington Challenger to be sure they are drawing same labs. ? ? ?Tommy Medal PharmD CPP [facility] ?[address] ?[address] ?[place], [place] [address] [phone] ? ? ? ?

## 2022-01-28 NOTE — Patient Instructions (Addendum)
Your Results: ?           ? Your most recent labs Goal  ?Total Cholesterol 158 < 200  ?Triglycerides 134 < 150  ?HDL (happy/good cholesterol) 40 > 40  ?LDL (lousy/bad cholesterol 94 < 70     ? ?Medication changes: ? Once we get the cholesterol labs back we will decide which cholesterol treatment is best for you.   ? ?Lab orders: ? Get cholesterol and liver function labs drawn in the next few weeks ? ?Patient Assistance:  The [employer] foundation offers assistance to help pay for medication copays.  They will cover copays for all cholesterol lowering meds, including statins, fibrates, omega-3 oils, ezetimibe, Repatha, Praluent, Nexletol, Nexlizet.  The cards are usually good for $2,500 or 12 months, whichever comes first. ?Go to healthwellfoundation.org ?Click on ?Apply Now? ?Answer questions as to whom is applying (patient or representative) ?Your disease fund will be ?hypercholesterolemia - Medicare access? ?They will ask questions about finances and which medications you are taking for cholesterol ?When you submit, the approval is usually within minutes.  You will need to print the card information from the site ?You will need to show this information to your pharmacy, they will bill your Medicare Part D plan first -then bill [employer] --for the copay.   ?You can also call them at [phone], although the hold times can be quite long.  ? ?Thank you for choosing [facility]  ? ?

## 2022-01-29 ENCOUNTER — Encounter: Payer: Self-pay | Admitting: Pharmacist Clinician (PhC)/ Clinical Pharmacy Specialist

## 2022-01-29 NOTE — Assessment & Plan Note (Signed)
Patient with hyperlipidemia, LDL goal < 70, currently not at goal on simvastatin 40 mg.  Her labs are just a year old, she notes that her primary draws them yearly (C. Melinda Crutch at [place]).  She is due to have this done later this month.  Reviewed options for lowering LDL cholesterol, including ezetimibe, PCSK-9 inhibitors, bempedoic acid and inclisiran.  Discussed mechanisms of action, dosing, side effects and potential decreases in LDL cholesterol.  Also reviewed cost information and potential options for patient assistance.  Answered all patient questions.  We agreed that once her labs result, we will talk on the phone to determine which medication will be best for her.  Labs ordered, she will show to Dr. Harrington Challenger to be sure they are drawing same labs. ? ?

## 2022-01-30 DIAGNOSIS — H2513 Age-related nuclear cataract, bilateral: Secondary | ICD-10-CM | POA: Diagnosis not present

## 2022-01-30 DIAGNOSIS — H25811 Combined forms of age-related cataract, right eye: Secondary | ICD-10-CM | POA: Diagnosis not present

## 2022-01-30 DIAGNOSIS — H25812 Combined forms of age-related cataract, left eye: Secondary | ICD-10-CM | POA: Diagnosis not present

## 2022-02-20 DIAGNOSIS — H2513 Age-related nuclear cataract, bilateral: Secondary | ICD-10-CM | POA: Diagnosis not present

## 2022-02-20 DIAGNOSIS — H25812 Combined forms of age-related cataract, left eye: Secondary | ICD-10-CM | POA: Diagnosis not present

## 2022-03-05 DIAGNOSIS — E1169 Type 2 diabetes mellitus with other specified complication: Secondary | ICD-10-CM | POA: Diagnosis not present

## 2022-03-05 DIAGNOSIS — E782 Mixed hyperlipidemia: Secondary | ICD-10-CM | POA: Diagnosis not present

## 2022-03-05 DIAGNOSIS — E559 Vitamin D deficiency, unspecified: Secondary | ICD-10-CM | POA: Diagnosis not present

## 2022-03-05 DIAGNOSIS — I10 Essential (primary) hypertension: Secondary | ICD-10-CM | POA: Diagnosis not present

## 2022-03-10 ENCOUNTER — Other Ambulatory Visit: Payer: Self-pay | Admitting: Pulmonary Disease

## 2022-03-12 DIAGNOSIS — E782 Mixed hyperlipidemia: Secondary | ICD-10-CM | POA: Diagnosis not present

## 2022-03-12 DIAGNOSIS — Z0000 Encounter for general adult medical examination without abnormal findings: Secondary | ICD-10-CM | POA: Diagnosis not present

## 2022-03-12 DIAGNOSIS — I10 Essential (primary) hypertension: Secondary | ICD-10-CM | POA: Diagnosis not present

## 2022-03-12 DIAGNOSIS — F324 Major depressive disorder, single episode, in partial remission: Secondary | ICD-10-CM | POA: Diagnosis not present

## 2022-03-14 ENCOUNTER — Encounter: Payer: Self-pay | Admitting: Pharmacist

## 2022-03-14 ENCOUNTER — Telehealth: Payer: Self-pay | Admitting: Pharmacist

## 2022-03-14 ENCOUNTER — Ambulatory Visit (INDEPENDENT_AMBULATORY_CARE_PROVIDER_SITE_OTHER): Payer: Medicare Other | Admitting: Pharmacist

## 2022-03-14 VITALS — BP 111/75 | HR 67 | Resp 14 | Ht 64.0 in | Wt 182.2 lb

## 2022-03-14 DIAGNOSIS — I6529 Occlusion and stenosis of unspecified carotid artery: Secondary | ICD-10-CM | POA: Diagnosis not present

## 2022-03-14 DIAGNOSIS — R931 Abnormal findings on diagnostic imaging of heart and coronary circulation: Secondary | ICD-10-CM | POA: Diagnosis not present

## 2022-03-14 DIAGNOSIS — E785 Hyperlipidemia, unspecified: Secondary | ICD-10-CM

## 2022-03-14 MED ORDER — REPATHA SURECLICK 140 MG/ML ~~LOC~~ SOAJ: 140.0000 mg | SUBCUTANEOUS | 11 refills | Status: DC

## 2022-03-14 NOTE — Telephone Encounter (Signed)
Called and lmom pt that they were approved for repatha, rxsent, pt instructed to call if cost prohibitive and to complete fasting lab work post 4th dose.

## 2022-03-14 NOTE — Addendum Note (Signed)
Addended by: Allean Found on: 03/14/2022 01:32 PM   Modules accepted: Orders

## 2022-03-14 NOTE — Telephone Encounter (Signed)
PA SUBMITTED: Jewel Baize (Key: [id number]) Repatha SureClick '140MG'$ /ML auto-injectors Status: PA Request Created: May 25th, 2023 Sent: May 25th, 2023  LIPID PANEL ORDERED/RELEASED  WILL CONTACT PT ONCE DETERMINATION IS MADE

## 2022-03-14 NOTE — Progress Notes (Signed)
Patient ID: Shannon Roberts                 DOB: 01-19-1951                    MRN: [id number]     HPI: Shannon Roberts is a 71 y.o. female patient referred to lipid clinic by Dr Audie Box. [facility] is significant for stroke, HTN, HLD, and elevated coronary CT score.  Patient currently managed on simvastatin '20mg'$  daily. Was prescribed '40mg'$  but she is pretty certain her bottle says '20mg'$ . Was started on atorvastatin but had elevated LFTs.    Coronary CT in August 2022 results:  The LAD has long, mild (25-49) calcified stenosis in the proximal vessel; mild calcified stenosis in the mid vessel; minimal (0-24)calcified plaque in the distal vessel. There is a large D1 with minimal (0-24) calcified plaque in the proximal to mid vessel. D2 with mild (25-49) calcified stenosis in the proximal vessel. Small D3.   Left circumflex artery: The LCX is non-dominant; there is mild (25-49) calcified stenosis in the proximal vessel; moderate (50-69) mixed plaque stenosis in the mid vessel; mild (25-49) calcified stenosis in the distal vessel. OM1 is small with mild (25-49) calcified stenosis in the proximal vessel. Large OM2 without significant stenosis. Lcx terminates in PDA and small posterolateral.   Right coronary artery: The RCA is nondominant with normal take off from the right coronary cusp. There is minimal (0-24) calcified stenosis in the ostium.  Both brothers died from MI.  Current Medications: simvastatin '20mg'$  daily Intolerances:  Risk Factors:  CVA CAD HTN Coronary calcium  LDL goal: <70  Labs: TC 177, Trigs 192, HDL 41, LDL 102 (03/05/22 on simvastatin 20)  Coronary calcium score of 1026. This was 57 percentile for age-, sex, and race-matched controls  Past Medical History:  Diagnosis Date   Allergic rhinitis    Anemia    per hosp. - recently- pt. told that she is anemic   Anxiety    Colitis    COPD (chronic obstructive pulmonary disease) ([place])    no o2   Depression    Diverticulosis     Female bladder prolapse    Fibroids    GERD (gastroesophageal reflux disease)    Heart murmur    Hx of adenomatous colonic polyps 08/26/2019   Hyperlipemia    Hypertension    IBS (irritable bowel syndrome)    Insomnia    Lip abscess 04/05/2016   Mitral valve prolapse    Peripheral vascular disease ([place])    Pre-diabetes 08/03/2019   Diet controlled     Right bundle branch block    Sciatic nerve disease    Shortness of breath    Stroke [facility]) March 2015    Current Outpatient Medications on File Prior to Visit  Medication Sig Dispense Refill   amLODipine (NORVASC) 5 MG tablet Take 5 mg by mouth daily.     ANORO ELLIPTA 62.5-25 MCG/ACT AEPB INHALE 1 PUFF INTO THE LUNGS DAILY 60 each 6   atenolol (TENORMIN) 50 MG tablet Take 50 mg by mouth daily.     Cholecalciferol (VITAMIN D3) 5000 units CAPS Take 5,000 Units by mouth every evening.      clopidogrel (PLAVIX) 75 MG tablet Take 75 mg by mouth daily.      DULoxetine (CYMBALTA) 30 MG capsule Take 60 mg by mouth daily at 12 noon. Patient take 2 tablets daily.     ferrous sulfate 325 (65 FE) MG  tablet Take 325 mg by mouth daily with breakfast.     fluticasone (FLONASE) 50 MCG/ACT nasal spray Place 1 spray into both nostrils daily.     Fluticasone-Salmeterol (ADVAIR DISKUS IN) Inhale into the lungs.     Ipratropium-Albuterol (COMBIVENT RESPIMAT) 20-100 MCG/ACT AERS respimat Inhale 1 puff into the lungs every 4 (four) hours as needed for wheezing or shortness of breath. 4 g 11   isosorbide mononitrate (IMDUR) 30 MG 24 hr tablet Take 30 mg by mouth in the morning and at bedtime.  0   lisinopril (PRINIVIL,ZESTRIL) 40 MG tablet Take 40 mg by mouth daily.     LORazepam (ATIVAN) 0.5 MG tablet Take 0.5 mg by mouth See admin instructions. Take 0.5 mg by mouth at bedtime and an additional 0.5 mg once a day as needed for anxiety  0   pantoprazole (PROTONIX) 40 MG tablet Take 40 mg by mouth in the morning and at bedtime.     simvastatin (ZOCOR) 20 MG  tablet Take 20 mg by mouth daily.     simvastatin (ZOCOR) 40 MG tablet Take 40 mg by mouth daily. (Patient not taking: Reported on 03/14/2022)     No current facility-administered medications on file prior to visit.    Allergies  Allergen Reactions   Carvedilol Palpitations    Other reaction(s): palpitations   Buspirone Other (See Comments)    Reaction not recalled Other reaction(s): did not work   Dapsone Other (See Comments)    Flushing  Other reaction(s): flushed   Niacin And Related Other (See Comments)    Flushing    Paroxetine Hcl     Other reaction(s): dizziness, dreams   Septra [Sulfamethoxazole-Trimethoprim] Diarrhea and Nausea And Vomiting   Sertraline Hcl     Other reaction(s): skin crawling fuzzy head   Amoxicillin Rash and Other (See Comments)    Has patient had a PCN reaction causing immediate rash, facial/tongue/throat swelling, SOB or lightheadedness with hypotension: No Has patient had a PCN reaction causing severe rash involving mucus membranes or skin necrosis: No Has patient had a PCN reaction that required hospitalization No Has patient had a PCN reaction occurring within the last 10 years: No If all of the above answers are "NO", then may proceed with Cephalosporin use. Other reaction(s): FLUSHED FEELING   Wellbutrin [Bupropion] Anxiety and Other (See Comments)    Caused the patient to feel "jittery"    Assessment/Plan:  1. Hyperlipidemia - Patient LDL 102 which is above goal of <70.  Advised patient should begin taking two simvastatin '20mg'$  tablets to make '40mg'$  as was prescribed. Voiced understanding.  Due to elevated coronary calcium score, patient would benefit from PCSK9i. Using  [employer] [place], educated patient  on mechanism of action, storage, site selection, administration, and possible adverse effects. Patient was able to demonstrate use in room. Will complete PA and contact patient when approved.  Patient voiced understanding.  Increase  simvastatin to '40mg'$  daily Start Repatha/Praluent sq q 14 days Recheck lipid panel in 2-3 months  Karren Cobble, PharmD, [place], [place], [place] [place], [place], [place], [address] Phone: [phone], Fax: [phone]

## 2022-03-14 NOTE — Telephone Encounter (Signed)
Please complete prior authorization for:  Name of medication, dose, and frequency Repatha '140mg'$  sq q 14 days or Praluent 75 mg sq q 14 days  Lab Orders Requested? yes  Which labs? Lipid panel  Estimated date for labs to be scheduled 2-3 months  Does patient need activated copay card? no

## 2022-03-14 NOTE — Addendum Note (Signed)
Addended by: Allean Found on: 03/14/2022 02:46 PM   Modules accepted: Orders

## 2022-03-14 NOTE — Patient Instructions (Signed)
It was nice meeting you today  We would like to reduce your LDL (bad cholesterol)   Please continue your simvastatin '40mg'$  daily  We would like to start a new medication called Repatha or Praluent which you will inject once every 2 weeks  I will complete the prior authorization for you and contact you when it is approved  Once you start the medication we will recheck your cholesterol in 2-3 months  Please call with any questions  Karren Cobble, PharmD, [place], [place], [place], [place], [place], [address] Phone: [phone], Fax: [phone]

## 2022-03-25 ENCOUNTER — Telehealth: Payer: Self-pay | Admitting: Cardiovascular Disease

## 2022-03-25 NOTE — Telephone Encounter (Signed)
Patient stated she didn't give first repatha injection properly and called [facility] (manufacturer) [phone] to get a courtesy replacement. Case number [id number]. Confirmed dosage with pharmacy. They will send patient the replacement. Patient wanted to know if repatha will react with Seroquel?

## 2022-03-25 NOTE — Telephone Encounter (Signed)
There should be no interaction between Seroquel and Repatha

## 2022-03-25 NOTE — Telephone Encounter (Signed)
Patient informed that pharmd stated.Marland KitchenMarland Kitchen"There should be no interaction between Seroquel and Repatha." She thanked [place] for calling her back.

## 2022-03-25 NOTE — Telephone Encounter (Signed)
Pt c/o medication issue:  1. Name of Medication: Evolocumab (REPATHA SURECLICK) 482 MG/ML SOAJ  2. How are you currently taking this medication (dosage and times per day)? Inject 140 mg into the skin every 14 (fourteen) days.  3. Are you having a reaction (difficulty breathing--STAT)? no  4. What is your medication issue? Needs to discuss medication with the dr

## 2022-04-17 ENCOUNTER — Emergency Department (HOSPITAL_COMMUNITY): Payer: Medicare Other

## 2022-04-17 ENCOUNTER — Emergency Department (HOSPITAL_COMMUNITY)
Admission: EM | Admit: 2022-04-17 | Discharge: 2022-04-17 | Disposition: A | Discharge status: Home / Self Care | Payer: Medicare Other | Attending: Emergency Medicine | Admitting: Emergency Medicine

## 2022-04-17 ENCOUNTER — Encounter (HOSPITAL_COMMUNITY): Payer: Self-pay

## 2022-04-17 DIAGNOSIS — J449 Chronic obstructive pulmonary disease, unspecified: Secondary | ICD-10-CM | POA: Diagnosis not present

## 2022-04-17 DIAGNOSIS — K838 Other specified diseases of biliary tract: Secondary | ICD-10-CM | POA: Diagnosis not present

## 2022-04-17 DIAGNOSIS — Z79899 Other long term (current) drug therapy: Secondary | ICD-10-CM | POA: Insufficient documentation

## 2022-04-17 DIAGNOSIS — R109 Unspecified abdominal pain: Secondary | ICD-10-CM | POA: Diagnosis not present

## 2022-04-17 DIAGNOSIS — N281 Cyst of kidney, acquired: Secondary | ICD-10-CM | POA: Diagnosis not present

## 2022-04-17 DIAGNOSIS — I10 Essential (primary) hypertension: Secondary | ICD-10-CM | POA: Insufficient documentation

## 2022-04-17 DIAGNOSIS — Z7951 Long term (current) use of inhaled steroids: Secondary | ICD-10-CM | POA: Diagnosis not present

## 2022-04-17 DIAGNOSIS — R0600 Dyspnea, unspecified: Secondary | ICD-10-CM | POA: Diagnosis not present

## 2022-04-17 DIAGNOSIS — K5900 Constipation, unspecified: Secondary | ICD-10-CM | POA: Diagnosis not present

## 2022-04-17 DIAGNOSIS — I714 Abdominal aortic aneurysm, without rupture, unspecified: Secondary | ICD-10-CM | POA: Diagnosis not present

## 2022-04-17 DIAGNOSIS — Z7902 Long term (current) use of antithrombotics/antiplatelets: Secondary | ICD-10-CM | POA: Diagnosis not present

## 2022-04-17 LAB — URINALYSIS, ROUTINE W REFLEX MICROSCOPIC
Bilirubin Urine: NEGATIVE
Glucose, UA: NEGATIVE mg/dL
Ketones, ur: NEGATIVE mg/dL
Nitrite: NEGATIVE
Protein, ur: NEGATIVE mg/dL
Specific Gravity, Urine: >1.046 — ABNORMAL HIGH (ref 1.005–1.030)
pH: 5 (ref 5.0–8.0)

## 2022-04-17 LAB — COMPREHENSIVE METABOLIC PANEL
ALT: 14 U/L (ref 0–44)
AST: 18 U/L (ref 15–41)
Albumin: 4.4 g/dL (ref 3.5–5.0)
Alkaline Phosphatase: 65 U/L (ref 38–126)
Anion gap: 10 (ref 5–15)
BUN: 26 mg/dL — ABNORMAL HIGH (ref 8–23)
CO2: 22 mmol/L (ref 22–32)
Calcium: 10.3 mg/dL (ref 8.9–10.3)
Chloride: 100 mmol/L (ref 98–111)
Creatinine, Ser: 1.44 mg/dL — ABNORMAL HIGH (ref 0.44–1.00)
GFR, Estimated: 39 mL/min — ABNORMAL LOW (ref >60)
Glucose, Bld: 152 mg/dL — ABNORMAL HIGH (ref 70–99)
Potassium: 4.7 mmol/L (ref 3.5–5.1)
Sodium: 132 mmol/L — ABNORMAL LOW (ref 135–145)
Total Bilirubin: 0.6 mg/dL (ref 0.3–1.2)
Total Protein: 8.2 g/dL — ABNORMAL HIGH (ref 6.5–8.1)

## 2022-04-17 LAB — CBC WITH DIFFERENTIAL/PLATELET
Abs Immature Granulocytes: 0.06 10*3/uL (ref 0.00–0.07)
Basophils Absolute: 0.1 10*3/uL (ref 0.0–0.1)
Basophils Relative: 1 %
Eosinophils Absolute: 0.2 10*3/uL (ref 0.0–0.5)
Eosinophils Relative: 3 %
HCT: 36.1 % (ref 36.0–46.0)
Hemoglobin: 11.9 g/dL — ABNORMAL LOW (ref 12.0–15.0)
Immature Granulocytes: 1 %
Lymphocytes Relative: 16 %
Lymphs Abs: 1.2 10*3/uL (ref 0.7–4.0)
MCH: 32.3 pg (ref 26.0–34.0)
MCHC: 33 g/dL (ref 30.0–36.0)
MCV: 98.1 fL (ref 80.0–100.0)
Monocytes Absolute: 0.5 10*3/uL (ref 0.1–1.0)
Monocytes Relative: 6 %
Neutro Abs: 5.7 10*3/uL (ref 1.7–7.7)
Neutrophils Relative %: 73 %
Platelets: 241 10*3/uL (ref 150–400)
RBC: 3.68 MIL/uL — ABNORMAL LOW (ref 3.87–5.11)
RDW: 12.8 % (ref 11.5–15.5)
WBC: 7.8 10*3/uL (ref 4.0–10.5)
nRBC: 0 % (ref 0.0–0.2)

## 2022-04-17 LAB — LIPASE, BLOOD: Lipase: 41 U/L (ref 11–51)

## 2022-04-17 MED ORDER — SODIUM CHLORIDE 0.9 % IV BOLUS
500.0000 mL | Freq: Once | INTRAVENOUS | Status: AC
  Administered 2022-04-17: 500 mL via INTRAVENOUS

## 2022-04-17 MED ORDER — HYDROMORPHONE HCL 1 MG/ML IJ SOLN
0.5000 mg | Freq: Once | INTRAMUSCULAR | Status: AC
  Administered 2022-04-17: 0.5 mg via INTRAVENOUS
  Filled 2022-04-17: qty 1

## 2022-04-17 MED ORDER — POLYETHYLENE GLYCOL 3350 17 GM/SCOOP PO POWD: 1.0000 | Freq: Once | ORAL | 0 refills | Status: AC

## 2022-04-17 MED ORDER — IOHEXOL 300 MG/ML  SOLN
80.0000 mL | Freq: Once | INTRAMUSCULAR | Status: AC | PRN
  Administered 2022-04-17: 80 mL via INTRAVENOUS

## 2022-04-17 MED ORDER — FAMOTIDINE IN NACL 20-0.9 MG/50ML-% IV SOLN
20.0000 mg | Freq: Once | INTRAVENOUS | Status: AC
Start: 2022-04-17 — End: 2022-04-17
  Administered 2022-04-17: 20 mg via INTRAVENOUS
  Filled 2022-04-17: qty 50

## 2022-04-17 MED ORDER — SUCRALFATE 1 G PO TABS
1.0000 g | ORAL_TABLET | Freq: Three times a day (TID) | ORAL | 0 refills | Status: DC
Start: 2022-04-17 — End: 2022-12-04

## 2022-04-17 MED ORDER — ONDANSETRON HCL 4 MG/2ML IJ SOLN
4.0000 mg | Freq: Once | INTRAMUSCULAR | Status: AC
  Administered 2022-04-17: 4 mg via INTRAVENOUS
  Filled 2022-04-17: qty 2

## 2022-04-17 NOTE — ED Provider Triage Note (Signed)
Emergency Medicine Provider Triage Evaluation Note  Shannon Roberts , a 71 y.o. female  was evaluated in triage.  Pt complains of generalized abdominal pain since Saturday.  Now located in the right upper quadrant.  Pain is intermittent.  Without associated flank pain, dysuria, nausea, vomiting.  Well-appearing on exam.  Endorses chronic mid low back pain no changes in back pain since onset of abdominal pain..  Review of Systems  Positive: As above Negative: As above  Physical Exam  BP (!) 142/86 (BP Location: Right Arm)   Pulse 86   Temp 98.2 F (36.8 C) (Oral)   Resp 20   SpO2 100%  Gen:   Awake, no distress   Resp:  Normal effort  MSK:   Moves extremities without difficulty  Other:  Right upper quadrant abdominal tenderness.  Otherwise abdomen benign and nontender.  Medical Decision Making  Medically screening exam initiated at 3:43 PM.  Appropriate orders placed.  Shannon Roberts was informed that the remainder of the evaluation will be completed by another provider, this initial triage assessment does not replace that evaluation, and the importance of remaining in the ED until their evaluation is complete.     Evlyn Courier, PA-C 04/17/22 1609

## 2022-04-17 NOTE — ED Triage Notes (Signed)
Pt c/o abdominal pain since Sunday. Pt denies N/V/D. Pt has known aortic aneurism. Pt denies CP. States her SOB is at baseline from her COPD.

## 2022-04-17 NOTE — Discharge Instructions (Addendum)
There are many causes of abdominal pain. Most pain is not serious and goes away, but some pain gets worse, changes, or will not go away. Please return to the emergency department or see your doctor right away if you (or your family member) experience any of the following:  1. Pain that gets worse or moves to just one spot.  2. Pain that gets worse if you cough or sneeze.  3. Pain with going over a bump in the road.  4. Pain that does not get better in 24 hours.  5. Inability to keep down liquids (vomiting)-especially if you are making less urine.  6. Fainting.  7. Blood in the vomit or stool.  8. High fever or shaking chills.  9. Swelling of the abdomen.  10. Any new or worsening problem.      Follow-up Instructions  See your primary care provider if not completely better in the next 2-3 days. Come to the ED if you are unable to see them in this time frame.    Additional Instructions  No alcohol.  No caffeine, aspirin, or cigarettes.   Please return to the emergency department immediately for any new or concerning symptoms, or if you get worse.    Please follow up with your vascular specialist regarding abdominal aortic aneurysm

## 2022-04-20 NOTE — ED Provider Notes (Signed)
[facility] DEPT Provider Note   CSN: [id number] Arrival date & time: 04/17/22  1514     History  Chief Complaint  Patient presents with   Abdominal Pain    Shannon Roberts is a 71 y.o. female.  Patient as above with significant medical history as below, including anxiety, COPD on ambient air, HLD, HTN, AAA who presents to the ED with complaint of abdominal pain.  Patient reports abdominal pain over the past 4 days.  No chest pain, no significant dyspnea.  No nausea or vomiting, no fevers or chills.  No change in bowel or bladder function.  No trauma.  No numbness or tingling.  No recent diet changes.  No recent alcohol use.  No excessive NSAID use.  Pain is intermittent.  Cramping sensation.     Past Medical History:  Diagnosis Date   Allergic rhinitis    Anemia    per hosp. - recently- pt. told that she is anemic   Anxiety    Colitis    COPD (chronic obstructive pulmonary disease) ([place])    no o2   Depression    Diverticulosis    Female bladder prolapse    Fibroids    GERD (gastroesophageal reflux disease)    Heart murmur    Hx of adenomatous colonic polyps 08/26/2019   Hyperlipemia    Hypertension    IBS (irritable bowel syndrome)    Insomnia    Lip abscess 04/05/2016   Mitral valve prolapse    Peripheral vascular disease ([place])    Pre-diabetes 08/03/2019   Diet controlled     Right bundle branch block    Sciatic nerve disease    Shortness of breath    Stroke [facility]) March 2015    Past Surgical History:  Procedure Laterality Date   CAROTID ENDARTERECTOMY Right January 17, 2014   CE   CHOLECYSTECTOMY     COLONOSCOPY     ENDARTERECTOMY Right 01/17/2014   Procedure: RIGHT CAROTID ARTERY ENDARTERECTOMY WITH DACRON PATCH ANGIOPLASTY;  Surgeon: Rosetta Posner, MD;  Location: [facility] OR;  Service: Vascular;  Laterality: Right;   UPPER GASTROINTESTINAL ENDOSCOPY       The history is provided by the patient. No language interpreter was used.   Abdominal Pain Associated symptoms: no chest pain, no chills, no cough, no fever, no hematuria, no nausea, no shortness of breath and no vomiting        Home Medications Prior to Admission medications   Medication Sig Start Date End Date Taking? Authorizing Provider  sucralfate (CARAFATE) 1 g tablet Take 1 tablet (1 g total) by mouth 4 (four) times daily -  with meals and at bedtime for 7 days. 04/17/22 04/24/22 Yes Wynona Dove A, DO  amLODipine (NORVASC) 5 MG tablet Take 5 mg by mouth daily. 03/11/21   [provider]  Celedonio Miyamoto 62.5-25 MCG/ACT AEPB INHALE 1 PUFF INTO THE LUNGS DAILY 03/11/22   Freddi Starr, MD  atenolol (TENORMIN) 50 MG tablet Take 50 mg by mouth daily.    [provider]  Cholecalciferol (VITAMIN D3) 5000 units CAPS Take 5,000 Units by mouth every evening.     [provider]  clopidogrel (PLAVIX) 75 MG tablet Take 75 mg by mouth daily.     [provider]  DULoxetine (CYMBALTA) 30 MG capsule Take 60 mg by mouth daily at 12 noon. Patient take 2 tablets daily.    [provider]  Evolocumab (REPATHA SURECLICK) 540 MG/ML SOAJ  Inject 140 mg into the skin every 14 (fourteen) days. 03/14/22   O'NealCassie Freer, MD  ferrous sulfate 325 (65 FE) MG tablet Take 325 mg by mouth daily with breakfast.    [provider]  fluticasone (FLONASE) 50 MCG/ACT nasal spray Place 1 spray into both nostrils daily.    [provider]  Fluticasone-Salmeterol (ADVAIR DISKUS IN) Inhale into the lungs.    [provider]  Ipratropium-Albuterol (COMBIVENT RESPIMAT) 20-100 MCG/ACT AERS respimat Inhale 1 puff into the lungs every 4 (four) hours as needed for wheezing or shortness of breath. 08/07/21   Freddi Starr, MD  isosorbide mononitrate (IMDUR) 30 MG 24 hr tablet Take 30 mg by mouth in the morning and at bedtime. 09/24/14   [provider]  lisinopril (PRINIVIL,ZESTRIL) 40 MG tablet Take 40 mg by mouth  daily.    [provider]  LORazepam (ATIVAN) 0.5 MG tablet Take 0.5 mg by mouth See admin instructions. Take 0.5 mg by mouth at bedtime and an additional 0.5 mg once a day as needed for anxiety    [provider]  pantoprazole (PROTONIX) 40 MG tablet Take 40 mg by mouth in the morning and at bedtime.    [provider]  simvastatin (ZOCOR) 40 MG tablet Take 40 mg by mouth daily.    [provider]      Allergies    Carvedilol, Buspirone, Dapsone, Niacin and related, Paroxetine hcl, Septra [sulfamethoxazole-trimethoprim], Sertraline hcl, Amoxicillin, and Wellbutrin [bupropion]    Review of Systems   Review of Systems  Constitutional:  Negative for chills and fever.  HENT:  Negative for facial swelling and trouble swallowing.   Eyes:  Negative for photophobia and visual disturbance.  Respiratory:  Negative for cough and shortness of breath.   Cardiovascular:  Negative for chest pain and palpitations.  Gastrointestinal:  Positive for abdominal pain. Negative for nausea and vomiting.  Endocrine: Negative for polydipsia and polyuria.  Genitourinary:  Negative for difficulty urinating and hematuria.  Musculoskeletal:  Negative for gait problem and joint swelling.  Skin:  Negative for pallor and rash.  Neurological:  Negative for syncope and headaches.  Psychiatric/Behavioral:  Negative for agitation and confusion.     Physical Exam Updated Vital Signs BP 108/82   Pulse 61   Temp 98.2 F (36.8 C) (Oral)   Resp 14   SpO2 95%  Physical Exam Vitals and nursing note reviewed.  Constitutional:      General: She is not in acute distress.    Appearance: Normal appearance.  HENT:     Head: Normocephalic and atraumatic.     Right Ear: External ear normal.     Left Ear: External ear normal.     Nose: Nose normal.     Mouth/Throat:     Mouth: Mucous membranes are moist.  Eyes:     General: No scleral icterus.       Right eye: No discharge.         Left eye: No discharge.  Cardiovascular:     Rate and Rhythm: Normal rate and regular rhythm.     Pulses: Normal pulses.          Radial pulses are 2+ on the right side and 2+ on the left side.       Dorsalis pedis pulses are 2+ on the left side.       Posterior tibial pulses are 2+ on the right side and 2+ on the left side.  Heart sounds: Normal heart sounds.  Pulmonary:     Effort: Pulmonary effort is normal. No respiratory distress.     Breath sounds: Normal breath sounds.  Abdominal:     General: Abdomen is flat.     Palpations: Abdomen is soft.     Tenderness: There is no abdominal tenderness. There is no guarding or rebound.     Comments: Nonperitoneal abdomen  No pulsatile masses  No abdominal bruit  Musculoskeletal:        General: Normal range of motion.     Cervical back: Normal range of motion.     Right lower leg: No edema.     Left lower leg: No edema.  Skin:    General: Skin is warm and dry.     Capillary Refill: Capillary refill takes less than 2 seconds.  Neurological:     Mental Status: She is alert and oriented to person, place, and time.     GCS: GCS eye subscore is 4. GCS verbal subscore is 5. GCS motor subscore is 6.  Psychiatric:        Mood and Affect: Mood normal.        Behavior: Behavior normal.     ED Results / Procedures / Treatments   Labs (all labs ordered are listed, but only abnormal results are displayed) Labs Reviewed  CBC WITH DIFFERENTIAL/PLATELET - Abnormal; Notable for the following components:      Result Value   RBC 3.68 (*)    Hemoglobin 11.9 (*)    All other components within normal limits  COMPREHENSIVE METABOLIC PANEL - Abnormal; Notable for the following components:   Sodium 132 (*)    Glucose, Bld 152 (*)    BUN 26 (*)    Creatinine, Ser 1.44 (*)    Total Protein 8.2 (*)    GFR, Estimated 39 (*)    All other components within normal limits  URINALYSIS, ROUTINE W REFLEX MICROSCOPIC - Abnormal; Notable for the  following components:   APPearance HAZY (*)    Specific Gravity, Urine >1.046 (*)    Hgb urine dipstick SMALL (*)    Leukocytes,Ua TRACE (*)    Bacteria, UA MANY (*)    All other components within normal limits  LIPASE, BLOOD    EKG None  Radiology No results found.  Procedures Procedures    Medications Ordered in ED Medications  iohexol (OMNIPAQUE) 300 MG/ML solution 80 mL (80 mLs Intravenous Contrast Given 04/17/22 1707)  sodium chloride 0.9 % bolus 500 mL (0 mLs Intravenous Stopped 04/17/22 2236)  HYDROmorphone (DILAUDID) injection 0.5 mg (0.5 mg Intravenous Given 04/17/22 2049)  famotidine (PEPCID) IVPB 20 mg premix (0 mg Intravenous Stopped 04/17/22 2236)  ondansetron (ZOFRAN) injection 4 mg (4 mg Intravenous Given 04/17/22 2041)    ED Course/ Medical Decision Making/ A&P                           Medical Decision Making Amount and/or Complexity of Data Reviewed Radiology: ordered.  Risk Prescription drug management.    CC: abd pain  This patient presents to the Emergency Department for the above complaint. This involves an extensive number of treatment options and is a complaint that carries with it a high risk of complications and morbidity. Vital signs were reviewed. Serious etiologies considered.  Differential diagnosis includes but is not exclusive to acute cholecystitis, intrathoracic causes for epigastric abdominal pain, gastritis, duodenitis, pancreatitis, small bowel or large bowel obstruction,  abdominal aortic aneurysm, hernia, gastritis, etc.  Record review:  Previous records obtained and reviewed prior office visits, prior ED visits, prior labs and imaging  Additional history obtained from N/A  Medical and surgical history as noted above.   Work up as above, notable for:  Labs & imaging results that were available during my care of the patient were visualized by me and considered in my medical decision making.  Physical exam as above.   I ordered  imaging studies which included chest x-ray, CT abdomen pelvis. I visualized the imaging, interpreted images, and I agree with radiologist interpretation.  Stable aortic aneurysm without evidence of rupture.  No acute intra-abdominal abnormalities.  Chest x-ray is non-acute  Cardiac monitoring reviewed and interpreted personally which shows NSR  Labs reviewed, creatinine is comparable to her baseline.  She has mild elevated BUN.  Give IV fluids.  Question dehydration.  Urinalysis without evidence of acute infection, favor contamination.  She has no dysuria.  Management: IV fluids, analgesics, antiemetic, Pepcid.  ED Course:     Reassessment:  Symptoms have resolved entirely.  Admission was considered.   Unclear etiology of her complaints today, possibly gastritis.  There is no evidence currently of an acute intra-abdominal etiology or an acute emergent condition present today.  dischargef in stable condition.   The patient's overall condition has improved, the patient presents with abdominal pain without signs of peritonitis, or other life-threatening serious etiology. The patient understands that at this time there is no evidence for a more malignant underlying process, but the patient also understands that early in the process of an illness, an emergency department workup can be falsely reassuring. Detailed discussions were had with the patient regarding current findings, and need for close f/u with PCP or on call doctor. The patient appears stable for discharge and has been instructed to return immediately if the symptoms worsen in any way for re-evaluation. Patient verbalized understanding and is in agreement with current care plan.  All questions answered prior to discharge.           Social determinants of health include -  Social History   Socioeconomic History   Marital status: Divorced    Spouse name: Not on file   Number of children: 2   Years of education: ASSOCIATES    Highest education level: Not on file  Occupational History   Occupation: Therapist, sports  Tobacco Use   Smoking status: Former    Packs/day: 1.50    Years: 47.00    Total pack years: 70.50    Types: Cigarettes    Start date: 12/03/1966    Quit date: 01/02/2014    Years since quitting: 8.3   Smokeless tobacco: Never   Tobacco comments:    Stopped for 6 months once in 2007  Vaping Use   Vaping Use: Never used  Substance and Sexual Activity   Alcohol use: No    Alcohol/week: 0.0 standard drinks of alcohol   Drug use: No   Sexual activity: Not Currently  Other Topics Concern   Not on file  Social History Narrative   Patient is divorced with 2 children.   Patient is right handed.   Patient has her Associates degree.   Patient drinks 3 cups daily.      [facility] (07/30/17):   Originally from [facility]. Has always lived in [place]. 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 Previously had cockatoo and a [place] for months. Did have mold in her current  home that was remediated. Enjoys playing on the computer, reading, & babysitting.     Social Determinants of Health   Financial Resource Strain: Not on file  Food Insecurity: Not on file  Transportation Needs: Not on file  Physical Activity: Not on file  Stress: Not on file  Social Connections: Not on file  Intimate Partner Violence: Not on file      This chart was dictated using voice recognition software.  Despite best efforts to proofread,  errors can occur which can change the documentation meaning.         Final Clinical Impression(s) / ED Diagnoses Final diagnoses:  Abdominal pain, unspecified abdominal location  Constipation, unspecified constipation type  Abdominal aortic aneurysm (AAA) without rupture, unspecified part ([place])    Rx / DC Orders ED Discharge Orders          Ordered    polyethylene glycol powder (GLYCOLAX/MIRALAX) 17 GM/SCOOP powder   Once        04/17/22 2233    sucralfate  (CARAFATE) 1 g tablet  3 times daily with meals & bedtime        04/17/22 2233              Jeanell Sparrow, DO 04/20/22 0011

## 2022-05-07 ENCOUNTER — Telehealth: Payer: Self-pay | Admitting: Pulmonary Disease

## 2022-05-07 MED ORDER — ANORO ELLIPTA 62.5-25 MCG/ACT IN AEPB: 1.0000 | INHALATION_SPRAY | Freq: Every day | RESPIRATORY_TRACT | 0 refills | Status: DC

## 2022-05-07 NOTE — Telephone Encounter (Signed)
Called pt and there was no answer-LMTCB °

## 2022-05-07 NOTE — Telephone Encounter (Signed)
Called and spoke with patient. She stated that she recently found out she is in the donut hole and her insurance will no longer pay for her Anoro. She is not completely out of the medication but will be in a few days. She is aware that I will place 2 samples up front for her to pick up as well as a patient assistance application for her. She will call her pharmacy to get a OOP report for this current year.   In the meantime, she wanted to know if Dr. Erin Fulling had any other suggestions for an inhaler that may be cheaper by similar to Anoro. She is aware that once we have the names of the inhalers, we will have to send a message to our pharmacy staff.   Dr. Erin Fulling, can you please advise? Thanks!

## 2022-05-13 NOTE — Telephone Encounter (Signed)
Bevespi and Stiolto are similar inhalers to anoro. I believe since she is in the donut hole any prescription will be pretty costly unfortunately.  Thanks, JD

## 2022-05-14 NOTE — Telephone Encounter (Signed)
Called pt and there was no answer-LMTCB °

## 2022-05-16 NOTE — Telephone Encounter (Signed)
Patient is returning phone call. Patient phone number is [phone].

## 2022-05-16 NOTE — Telephone Encounter (Signed)
Spoke with pt and reviewed Dr. August Albino advise. Pt stated understanding. Nothing further needed at this time

## 2022-05-30 LAB — LIPID PANEL
Chol/HDL Ratio: 2.1 ratio (ref 0.0–4.4)
Cholesterol, Total: 92 mg/dL — ABNORMAL LOW (ref 100–199)
HDL: 44 mg/dL (ref >39)
LDL Chol Calc (NIH): 28 mg/dL (ref 0–99)
Triglycerides: 106 mg/dL (ref 0–149)
VLDL Cholesterol Cal: 20 mg/dL (ref 5–40)

## 2022-06-11 DIAGNOSIS — R110 Nausea: Secondary | ICD-10-CM | POA: Diagnosis not present

## 2022-06-11 DIAGNOSIS — K5900 Constipation, unspecified: Secondary | ICD-10-CM | POA: Diagnosis not present

## 2022-06-15 ENCOUNTER — Other Ambulatory Visit: Payer: Self-pay

## 2022-06-15 ENCOUNTER — Emergency Department (HOSPITAL_COMMUNITY): Payer: Medicare Other

## 2022-06-15 ENCOUNTER — Encounter (HOSPITAL_COMMUNITY): Payer: Self-pay

## 2022-06-15 ENCOUNTER — Emergency Department (HOSPITAL_COMMUNITY)
Admission: EM | Admit: 2022-06-15 | Discharge: 2022-06-15 | Disposition: A | Discharge status: Home / Self Care | Payer: Medicare Other | Attending: Emergency Medicine | Admitting: Emergency Medicine

## 2022-06-15 DIAGNOSIS — Z7951 Long term (current) use of inhaled steroids: Secondary | ICD-10-CM | POA: Diagnosis not present

## 2022-06-15 DIAGNOSIS — R9431 Abnormal electrocardiogram [ECG] [EKG]: Secondary | ICD-10-CM | POA: Diagnosis not present

## 2022-06-15 DIAGNOSIS — M47814 Spondylosis without myelopathy or radiculopathy, thoracic region: Secondary | ICD-10-CM | POA: Diagnosis not present

## 2022-06-15 DIAGNOSIS — R1013 Epigastric pain: Secondary | ICD-10-CM

## 2022-06-15 DIAGNOSIS — J449 Chronic obstructive pulmonary disease, unspecified: Secondary | ICD-10-CM | POA: Insufficient documentation

## 2022-06-15 DIAGNOSIS — E871 Hypo-osmolality and hyponatremia: Secondary | ICD-10-CM | POA: Diagnosis not present

## 2022-06-15 DIAGNOSIS — I714 Abdominal aortic aneurysm, without rupture, unspecified: Secondary | ICD-10-CM | POA: Diagnosis not present

## 2022-06-15 DIAGNOSIS — Z7901 Long term (current) use of anticoagulants: Secondary | ICD-10-CM | POA: Diagnosis not present

## 2022-06-15 DIAGNOSIS — J432 Centrilobular emphysema: Secondary | ICD-10-CM | POA: Diagnosis not present

## 2022-06-15 DIAGNOSIS — I2510 Atherosclerotic heart disease of native coronary artery without angina pectoris: Secondary | ICD-10-CM | POA: Diagnosis not present

## 2022-06-15 LAB — COMPREHENSIVE METABOLIC PANEL
ALT: 14 U/L (ref 0–44)
AST: 19 U/L (ref 15–41)
Albumin: 4.3 g/dL (ref 3.5–5.0)
Alkaline Phosphatase: 69 U/L (ref 38–126)
Anion gap: 9 (ref 5–15)
BUN: 17 mg/dL (ref 8–23)
CO2: 24 mmol/L (ref 22–32)
Calcium: 9.9 mg/dL (ref 8.9–10.3)
Chloride: 94 mmol/L — ABNORMAL LOW (ref 98–111)
Creatinine, Ser: 1.47 mg/dL — ABNORMAL HIGH (ref 0.44–1.00)
GFR, Estimated: 38 mL/min — ABNORMAL LOW (ref >60)
Glucose, Bld: 129 mg/dL — ABNORMAL HIGH (ref 70–99)
Potassium: 4.8 mmol/L (ref 3.5–5.1)
Sodium: 127 mmol/L — ABNORMAL LOW (ref 135–145)
Total Bilirubin: 0.5 mg/dL (ref 0.3–1.2)
Total Protein: 8 g/dL (ref 6.5–8.1)

## 2022-06-15 LAB — CBC WITH DIFFERENTIAL/PLATELET
Abs Immature Granulocytes: 0.12 10*3/uL — ABNORMAL HIGH (ref 0.00–0.07)
Basophils Absolute: 0.1 10*3/uL (ref 0.0–0.1)
Basophils Relative: 1 %
Eosinophils Absolute: 0.3 10*3/uL (ref 0.0–0.5)
Eosinophils Relative: 4 %
HCT: 33.4 % — ABNORMAL LOW (ref 36.0–46.0)
Hemoglobin: 11.5 g/dL — ABNORMAL LOW (ref 12.0–15.0)
Immature Granulocytes: 2 %
Lymphocytes Relative: 19 %
Lymphs Abs: 1.4 10*3/uL (ref 0.7–4.0)
MCH: 32.2 pg (ref 26.0–34.0)
MCHC: 34.4 g/dL (ref 30.0–36.0)
MCV: 93.6 fL (ref 80.0–100.0)
Monocytes Absolute: 0.7 10*3/uL (ref 0.1–1.0)
Monocytes Relative: 9 %
Neutro Abs: 5.1 10*3/uL (ref 1.7–7.7)
Neutrophils Relative %: 65 %
Platelets: 280 10*3/uL (ref 150–400)
RBC: 3.57 MIL/uL — ABNORMAL LOW (ref 3.87–5.11)
RDW: 12.3 % (ref 11.5–15.5)
WBC: 7.7 10*3/uL (ref 4.0–10.5)
nRBC: 0 % (ref 0.0–0.2)

## 2022-06-15 LAB — URINALYSIS, ROUTINE W REFLEX MICROSCOPIC
Bilirubin Urine: NEGATIVE
Glucose, UA: NEGATIVE mg/dL
Hgb urine dipstick: NEGATIVE
Ketones, ur: NEGATIVE mg/dL
Leukocytes,Ua: NEGATIVE
Nitrite: NEGATIVE
Protein, ur: NEGATIVE mg/dL
Specific Gravity, Urine: 1.006 (ref 1.005–1.030)
pH: 5 (ref 5.0–8.0)

## 2022-06-15 LAB — LIPASE, BLOOD: Lipase: 44 U/L (ref 11–51)

## 2022-06-15 MED ORDER — SODIUM CHLORIDE (PF) 0.9 % IJ SOLN
INTRAMUSCULAR | Status: AC
  Filled 2022-06-15: qty 50

## 2022-06-15 MED ORDER — IOHEXOL 350 MG/ML SOLN
125.0000 mL | Freq: Once | INTRAVENOUS | Status: AC | PRN
  Administered 2022-06-15: 100 mL via INTRAVENOUS

## 2022-06-15 NOTE — ED Provider Triage Note (Signed)
Emergency Medicine Provider Triage Evaluation Note  Shannon Roberts , a 71 y.o. female  was evaluated in triage.  Pt complains of intermittent abdominal pain x1 week.  Patient states abdominal pain radiates to back.  Patient has known abdominal aneurysm.  Abdominal pain associated with nausea.  No vomiting.  She endorses some dysuria earlier today which has resolved.  No rectal bleeding.  She had 3 bowel movements earlier today. No CP or SOB. History of IBS.  Review of Systems  Positive: Abdominal pain, nausea Negative: CP  Physical Exam  BP (!) 140/82   Pulse 63   Temp 98 F (36.7 C)   Resp 15   Ht '5\' 4"'$  (1.626 m)   Wt 81.6 kg   SpO2 100%   BMI 30.90 kg/m  Gen:   Awake, no distress   Resp:  Normal effort  MSK:   Moves extremities without difficulty  Other:  Diffuse abdominal tenderness  Medical Decision Making  Medically screening exam initiated at 3:37 PM.  Appropriate orders placed.  VIENNA FOLDEN was informed that the remainder of the evaluation will be completed by another provider, this initial triage assessment does not replace that evaluation, and the importance of remaining in the ED until their evaluation is complete.  Abdominal pain.  History of infrarenal abdominal aortic aneurysm.  CTA chest/abdomen/pelvis ordered.  Abdominal labs ordered.  Patient stable in triage.   Suzy Bouchard, [place] 06/15/22 1540

## 2022-06-15 NOTE — ED Provider Notes (Signed)
[facility] DEPT Provider Note   CSN: [id number] Arrival date & time: 06/15/22  1513     History  Chief Complaint  Patient presents with   Abdominal Pain    Shannon Roberts is a 71 y.o. female who presents with a chief complaint of abdominal pain.  She has a past medical history of chronic recurrent episodes of abdominal pain, known abdominal aortic aneurysm, COPD, hyperlipidemia.  Patient states that she has had about 1 week of intermittent epigastric abdominal pain that sometimes radiates to her left side sometimes radiates to her back.  She has had several episodes of associated nausea without vomiting.  She saw her primary care doctor on Tuesday and was prescribed MiraLAX, Phenergan and Levsin.  She states the Levsin helped significantly but made her "feel funny."  Patient states that by Thursday she was feeling much better but Friday the pain began again.  She has had no nausea or vomiting since then.  She states that she had persistent pain so she decided to come in today.  She has seen gastroenterology in the past and had an upper endoscopy done 2 years ago by Dr. Carlean Purl.  I reviewed findings from the endoscopy which showed no abnormalities.  Patient states that she has no abdominal pain or nausea at this time.  She denies fevers, chills, hematemesis, melena, hematochezia, diarrhea.   Abdominal Pain      Home Medications Prior to Admission medications   Medication Sig Start Date End Date Taking? Authorizing Provider  amLODipine (NORVASC) 5 MG tablet Take 5 mg by mouth daily. 03/11/21   [provider]  Celedonio Miyamoto 62.5-25 MCG/ACT AEPB INHALE 1 PUFF INTO THE LUNGS DAILY 03/11/22   Freddi Starr, MD  atenolol (TENORMIN) 50 MG tablet Take 50 mg by mouth daily.    [provider]  Cholecalciferol (VITAMIN D3) 5000 units CAPS Take 5,000 Units by mouth every evening.     [provider]  clopidogrel (PLAVIX) 75 MG tablet  Take 75 mg by mouth daily.     [provider]  DULoxetine (CYMBALTA) 30 MG capsule Take 60 mg by mouth daily at 12 noon. Patient take 2 tablets daily.    [provider]  Evolocumab (REPATHA SURECLICK) 109 MG/ML SOAJ Inject 140 mg into the skin every 14 (fourteen) days. 03/14/22   O'NealCassie Freer, MD  ferrous sulfate 325 (65 FE) MG tablet Take 325 mg by mouth daily with breakfast.    [provider]  fluticasone (FLONASE) 50 MCG/ACT nasal spray Place 1 spray into both nostrils daily.    [provider]  GAVILYTE-G 236 g solution SMARTSIG:Milliliter(s) By Mouth 06/12/22   [provider]  Ipratropium-Albuterol (COMBIVENT RESPIMAT) 20-100 MCG/ACT AERS respimat Inhale 1 puff into the lungs every 4 (four) hours as needed for wheezing or shortness of breath. 08/07/21   Freddi Starr, MD  isosorbide mononitrate (IMDUR) 30 MG 24 hr tablet Take 30 mg by mouth in the morning and at bedtime. 09/24/14   [provider]  lisinopril (PRINIVIL,ZESTRIL) 40 MG tablet Take 40 mg by mouth daily.    [provider]  LORazepam (ATIVAN) 0.5 MG tablet Take 0.5 mg by mouth See admin instructions. Take 0.5 mg by mouth at bedtime and an additional 0.5 mg once a day as needed for anxiety    [provider]  ondansetron (ZOFRAN) 4 MG tablet Take 4 mg by mouth every 8 (eight) hours as needed. 06/10/22  [provider]  pantoprazole (PROTONIX) 40 MG tablet Take 40 mg by mouth in the morning and at bedtime.    [provider]  simvastatin (ZOCOR) 20 MG tablet SMARTSIG:2 Tablet(s) By Mouth Every Evening 06/06/22   [provider]  simvastatin (ZOCOR) 40 MG tablet Take 40 mg by mouth daily.    [provider]  sucralfate (CARAFATE) 1 g tablet Take 1 tablet (1 g total) by mouth 4 (four) times daily -  with meals and at bedtime for 7 days. 04/17/22 04/24/22  Jeanell Sparrow, DO  umeclidinium-vilanterol (ANORO ELLIPTA)  62.5-25 MCG/ACT AEPB Inhale 1 puff into the lungs daily. 05/07/22   Freddi Starr, MD      Allergies    Carvedilol, Buspirone, Dapsone, Niacin and related, Paroxetine hcl, Septra [sulfamethoxazole-trimethoprim], Sertraline hcl, Amoxicillin, and Wellbutrin [bupropion]    Review of Systems   Review of Systems  Gastrointestinal:  Positive for abdominal pain.    Physical Exam Updated Vital Signs BP (!) 130/57 (BP Location: Left Arm)   Pulse 63   Temp 97.7 F (36.5 C) (Oral)   Resp 17   Ht '5\' 4"'$  (1.626 m)   Wt 81.6 kg   SpO2 100%   BMI 30.90 kg/m  Physical Exam Vitals and nursing note reviewed.  Constitutional:      General: She is not in acute distress.    Appearance: She is well-developed. She is not diaphoretic.  HENT:     Head: Normocephalic and atraumatic.     Right Ear: External ear normal.     Left Ear: External ear normal.     Nose: Nose normal.     Mouth/Throat:     Mouth: Mucous membranes are moist.  Eyes:     General: No scleral icterus.    Conjunctiva/sclera: Conjunctivae normal.  Cardiovascular:     Rate and Rhythm: Normal rate and regular rhythm.     Heart sounds: Normal heart sounds. No murmur heard.    No friction rub. No gallop.  Pulmonary:     Effort: Pulmonary effort is normal. No respiratory distress.     Breath sounds: Normal breath sounds.  Abdominal:     General: Bowel sounds are normal. There is no distension.     Palpations: Abdomen is soft. There is no mass.     Tenderness: There is no abdominal tenderness. There is no guarding.  Musculoskeletal:     Cervical back: Normal range of motion.  Skin:    General: Skin is warm and dry.  Neurological:     Mental Status: She is alert and oriented to person, place, and time.  Psychiatric:        Behavior: Behavior normal.     ED Results / Procedures / Treatments   Labs (all labs ordered are listed, but only abnormal results are displayed) Labs Reviewed  CBC WITH DIFFERENTIAL/PLATELET -  Abnormal; Notable for the following components:      Result Value   RBC 3.57 (*)    Hemoglobin 11.5 (*)    HCT 33.4 (*)    Abs Immature Granulocytes 0.12 (*)    All other components within normal limits  COMPREHENSIVE METABOLIC PANEL - Abnormal; Notable for the following components:   Sodium 127 (*)    Chloride 94 (*)    Glucose, Bld 129 (*)    Creatinine, Ser 1.47 (*)    GFR, Estimated 38 (*)    All other components within normal limits  URINALYSIS, ROUTINE W REFLEX MICROSCOPIC -  Abnormal; Notable for the following components:   Color, Urine STRAW (*)    Bacteria, UA RARE (*)    All other components within normal limits  LIPASE, BLOOD    EKG None  Radiology CT Angio Chest/Abd/Pel for Dissection W and/or Wo Contrast  Result Date: 06/15/2022 CLINICAL DATA:  Known abdominal aortic aneurysm, now with abdominal pain, evaluate for dissection EXAM: CT ANGIOGRAPHY CHEST, ABDOMEN AND PELVIS TECHNIQUE: Non-contrast CT of the chest was initially obtained. Multidetector CT imaging through the chest, abdomen and pelvis was performed using the standard protocol during bolus administration of intravenous contrast. Multiplanar reconstructed images and MIPs were obtained and reviewed to evaluate the vascular anatomy. RADIATION DOSE REDUCTION: This exam was performed according to the departmental dose-optimization program which includes automated exposure control, adjustment of the mA and/or kV according to patient size and/or use of iterative reconstruction technique. CONTRAST:  169m OMNIPAQUE IOHEXOL 350 MG/ML SOLN COMPARISON:  CT abdomen/pelvis dated 04/09/2022. CTA chest abdomen pelvis dated 06/26/2021. FINDINGS: CTA CHEST FINDINGS Cardiovascular: Unenhanced CT, there is no evidence of intramural hematoma. Following contrast administration, there is no evidence of thoracic aortic aneurysm or dissection. Atherosclerotic calcifications of the arch. Although not tailored for evaluation of the pulmonary  arteries, there is no evidence of central pulmonary embolism. The heart is normal in size.  No pericardial effusion. Coronary atherosclerosis of the LAD and left circumflex. Mediastinum/Nodes: No suspicious mediastinal lymphadenopathy. Visualized thyroid is unremarkable. Lungs/Pleura: Moderate centrilobular and paraseptal emphysematous changes, upper lung predominant. No suspicious pulmonary nodules. No focal consolidation. No pleural effusion or pneumothorax. Musculoskeletal: Degenerative changes of the lower thoracic spine. Review of the MIP images confirms the above findings. CTA ABDOMEN AND PELVIS FINDINGS VASCULAR Aorta: 3.2 x 2.8 cm infrarenal abdominal aortic aneurysm (series 6/image 121), previously 3.0 cm, similar. No evidence of dissection. Atherosclerotic calcifications of the aortic arch. Celiac: Patent.  Atherosclerotic calcifications. SMA: Patent.  Atherosclerotic calcifications. Renals: Patent bilaterally.  Atherosclerotic calcifications. IMA: Patent. Inflow: Patent bilaterally.  Atherosclerotic calcifications. Veins: Grossly unremarkable. Review of the MIP images confirms the above findings. NON-VASCULAR Hepatobiliary: Liver is within normal limits. Status post cholecystectomy. No intrahepatic ductal dilatation. Common duct measures 13 mm and smoothly tapers at the ampulla, likely postsurgical. Pancreas: Within normal limits. Spleen: Within normal limits. Adrenals/Urinary Tract: Adrenal glands are within normal limits. Left renal cysts, measuring up to 2.3 cm in the left upper pole (series 6/image 93), measuring simple fluid density, benign (Bosniak I). No follow-up is recommended. Right kidney is within normal limits. No hydronephrosis. Bladder is underdistended but unremarkable. Stomach/Bowel: Stomach is within normal limits. No evidence of bowel obstruction. Mobile cecum in the left mid abdomen. Appendix is not discretely visualized. No colonic wall thickening or inflammatory changes. Lymphatic:  No suspicious abdominopelvic lymphadenopathy. Reproductive: Uterus is within normal limits. Left ovary is not discretely visualized but there is no adnexal mass. Stable 8.8 x 6.2 cm unilocular cystic lesion in the right adnexa/posterior pelvis (series 6/image 166). Consider annual pelvic ultrasound. Other: No pelvic ascites. Musculoskeletal: Visualized osseous structures are within normal limits. Review of the MIP images confirms the above findings. IMPRESSION: 3.2 x 2.8 cm infrarenal abdominal aortic aneurysm, similar to recent CT. No evidence of dissection. Recommend follow-up every 3 years. Reference: J Am Coll Radiol 20865;78:469-629 Stable 8.8 x 6.2 cm unilocular cystic lesion in the right adnexa/posterior pelvis. Consider annual pelvic ultrasound. Aortic Atherosclerosis (ICD10-I70.0) and Emphysema (ICD10-J43.9). Electronically Signed   By: SJulian HyM.D.   On: 06/15/2022 18:28  Procedures Procedures    Medications Ordered in ED Medications  sodium chloride (PF) 0.9 % injection (has no administration in time range)  iohexol (OMNIPAQUE) 350 MG/ML injection 125 mL (100 mLs Intravenous Contrast Given 06/15/22 1753)    ED Course/ Medical Decision Making/ A&P Clinical Course as of 06/15/22 2314  Sat Jun 15, 2022  2252 Sodium(!): 127 [AH]  2303 Urinalysis, Routine w reflex microscopic Urine, Clean Catch(!) [AH]  2306 CBC with Differential(!) [AH]  2307 CT Angio Chest/Abd/Pel for Dissection W and/or Wo Contrast I reviewed CT angio chest abdomen pelvis and personally interpreted findings.  There are no acute findings, [AH]  2307 ED EKG EKG shows sinus rhythm at a rate of 64 [AH]    Clinical Course User Index [AH] Margarita Mail, PA-C                           Medical Decision Making Amount and/or Complexity of Data Reviewed Discussion of management or test interpretation with external provider(s): This patient presents to the ED for concern of abdominal pain, this involves an  extensive number of treatment options, and is a complaint that carries with it a high risk of complications and morbidity.  The differential diagnosis includes Differential diagnosis of epigastric pain includes: Functional or nonulcer dyspepsia, PUD, GERD, Gastritis, (NSAIDs, alcohol, stress, H. pylori, pernicious anemia), pancreatitis or pancreatic cancer, overeating indigestion (high-fat foods, coffee), drugs (aspirin, antibiotics (eg, macrolides, metronidazole), corticosteroids, digoxin, narcotics, theophylline), gastroparesis, lactose intolerance, malabsorption, gastric cancer, parasitic infection, (Giardia, Strongyloides, Ascaris) cholelithiasis, choledocholithiasis, or cholangitis, ACS, pericarditis, pneumonia, abdominal hernia, pregnancy, intestinal ischemia, esophageal rupture, gastric volvulus, hepatitis. After review of all data points patient does not appear to have any emergent cause of her pain today.  Lab values do show a mild hyponatremia however I do not think this is the cause of her nausea or vomiting she has no other complaints such as confusion, lethargy, gait instability.  Have her follow-up with her PCP for reevaluation of her hyponatremia.    Co morbidities that complicate the patient evaluation       Abdominal aortic aneurysm   Additional history obtained:   Lab Tests:  I Ordered, and personally interpreted labs.  The pertinent results include: Sodium of 127   Imaging Studies ordered:  I ordered imaging studies including CT angiogram of the chest abdomen pelvis I independently visualized and interpreted imaging which showed no acute findings I agree with the radiologist interpretation   Cardiac Monitoring:       The patient was maintained on a cardiac monitor.  I personally viewed and interpreted the cardiac monitored which showed an underlying rhythm of: Normal sinus rhythm   Medicines ordered and prescription drug management:     Test  Considered:     Problem List / ED Course:       Recurrent epigastric abdominal pain, resolved   Reevaluation:  After the interventions noted above, I reevaluated the patient and found that they have :resolved   Social Determinants of Health:       Insured, close outpatient follow-up   Dispostion:  After consideration of the diagnostic results and the patients response to treatment, I feel that the patent would benefit from discharge.             Final Clinical Impression(s) / ED Diagnoses Final diagnoses:  Epigastric abdominal pain  Hyponatremia    Rx / DC Orders ED Discharge Orders     None  Margarita Mail, PA-C 06/15/22 2315    Palumbo, April, MD 06/16/22 [phone]

## 2022-06-15 NOTE — ED Triage Notes (Signed)
Pt arrives with c/o ABD pain that started last week. Pt endorses nausea and back pain. Per pt, she does have an ABD aneurysm and is worried that this pain is that.

## 2022-06-15 NOTE — Discharge Instructions (Addendum)
Your exam was very reassuring today.  Your sodium was slightly low.  This is not an emergent finding but you should have your blood work reevaluated in the next week or 2 with your primary care doctor to make sure that it is improving. If you begin having confusion, weakness, difficulty walking, severe nausea and vomiting then you should return immediately to the emergency department.  SEEK IMMEDIATE MEDICAL ATTENTION IF: The pain does not go away or becomes severe.  A temperature above 101 develops.  Repeated vomiting occurs (multiple episodes).  The pain becomes localized to portions of the abdomen. The right side could possibly be appendicitis. In an adult, the left lower portion of the abdomen could be colitis or diverticulitis.  Blood is being passed in stools or vomit (bright red or black tarry stools).  Return also if you develop chest pain, difficulty breathing, dizziness or fainting, or become confused, poorly responsive, or inconsolable (young children).

## 2022-07-02 DIAGNOSIS — J441 Chronic obstructive pulmonary disease with (acute) exacerbation: Secondary | ICD-10-CM | POA: Diagnosis not present

## 2022-07-02 DIAGNOSIS — M549 Dorsalgia, unspecified: Secondary | ICD-10-CM | POA: Diagnosis not present

## 2022-07-02 DIAGNOSIS — R899 Unspecified abnormal finding in specimens from other organs, systems and tissues: Secondary | ICD-10-CM | POA: Diagnosis not present

## 2022-07-02 DIAGNOSIS — D649 Anemia, unspecified: Secondary | ICD-10-CM | POA: Diagnosis not present

## 2022-07-02 DIAGNOSIS — R7309 Other abnormal glucose: Secondary | ICD-10-CM | POA: Diagnosis not present

## 2022-07-02 DIAGNOSIS — N1832 Chronic kidney disease, stage 3b: Secondary | ICD-10-CM | POA: Diagnosis not present

## 2022-08-30 ENCOUNTER — Other Ambulatory Visit: Payer: Self-pay | Admitting: Cardiovascular Disease

## 2022-08-30 ENCOUNTER — Telehealth: Payer: Self-pay | Admitting: Cardiovascular Disease

## 2022-08-30 NOTE — Telephone Encounter (Signed)
*  STAT* If patient is at the pharmacy, call can be transferred to refill team.   1. Which medications need to be refilled? (please list name of each medication and dose if known)   Evolocumab (REPATHA SURECLICK) 179 MG/ML SOAJ   2. Which pharmacy/location (including street and city if local pharmacy) is medication to be sent to?  [place], [place] - [address] AT [place]   3. Do they need a 30 day or 90 day supply?  30 day  Patient stated she is completely out of this medication.

## 2022-09-02 MED ORDER — REPATHA SURECLICK 140 MG/ML ~~LOC~~ SOAJ: 140.0000 mg | SUBCUTANEOUS | 3 refills | Status: DC

## 2022-09-02 NOTE — Telephone Encounter (Signed)
Refill sent.

## 2022-09-19 ENCOUNTER — Other Ambulatory Visit: Payer: Self-pay | Admitting: Pulmonary Disease

## 2022-09-23 ENCOUNTER — Telehealth: Payer: Self-pay | Admitting: Pulmonary Disease

## 2022-09-24 NOTE — Telephone Encounter (Signed)
ATC patient. LVMTCB. 

## 2022-10-01 MED ORDER — ANORO ELLIPTA 62.5-25 MCG/ACT IN AEPB: 1.0000 | INHALATION_SPRAY | Freq: Every day | RESPIRATORY_TRACT | 0 refills | Status: DC

## 2022-10-01 NOTE — Telephone Encounter (Signed)
Refilled anoro. But placed on order that pt is needing office visit for more refills. Tried to call patient but she did not pick up. Nothing further needed

## 2022-10-01 NOTE — Telephone Encounter (Signed)
Pt calling again. Needs ANORO ELLIPTA 62.5-25  called in to [facility] not [facility]. Adv we tried to call her back and left msg, I told her. Says she does not have voice mail.  Pls try again @ [phone]

## 2022-11-06 DIAGNOSIS — M5430 Sciatica, unspecified side: Secondary | ICD-10-CM | POA: Diagnosis not present

## 2022-11-06 DIAGNOSIS — M549 Dorsalgia, unspecified: Secondary | ICD-10-CM | POA: Diagnosis not present

## 2022-11-06 DIAGNOSIS — M25561 Pain in right knee: Secondary | ICD-10-CM | POA: Diagnosis not present

## 2022-11-06 DIAGNOSIS — F419 Anxiety disorder, unspecified: Secondary | ICD-10-CM | POA: Diagnosis not present

## 2022-11-20 ENCOUNTER — Other Ambulatory Visit: Payer: Self-pay | Admitting: Pulmonary Disease

## 2022-11-20 ENCOUNTER — Telehealth: Payer: Self-pay | Admitting: Pulmonary Disease

## 2022-11-20 MED ORDER — ANORO ELLIPTA 62.5-25 MCG/ACT IN AEPB: 1.0000 | INHALATION_SPRAY | Freq: Every day | RESPIRATORY_TRACT | 0 refills | Status: DC

## 2022-11-20 NOTE — Telephone Encounter (Signed)
See last signed encounter. States the following:  Refilled anoro. But placed on order that pt is needing office visit for more refills. Tried to call patient but she did not pick up. Nothing further needed    Adv PT we tried to call her but she did not pick up. "And they did not leaver a message!" , she yelled. Then I adv PT the encounter before that says she did not have voice mail so we could not leave a message.  Refused to make appt. Needs medication now!  Pls call @ [phone].  Thank you.

## 2022-11-20 NOTE — Telephone Encounter (Signed)
I called and spoke with the pt  Explained that the pt we need to see her once per year at least to be able to continue to prescribe her med  She verbalized understanding  Rx refilled once more and appt with JD on 12/04/22 Nothing further needed

## 2022-11-23 DIAGNOSIS — Z0100 Encounter for examination of eyes and vision without abnormal findings: Secondary | ICD-10-CM | POA: Diagnosis not present

## 2022-12-03 ENCOUNTER — Ambulatory Visit (HOSPITAL_BASED_OUTPATIENT_CLINIC_OR_DEPARTMENT_OTHER)
Admission: RE | Admit: 2022-12-03 | Discharge: 2022-12-03 | Disposition: A | Discharge status: Home / Self Care | Payer: Medicare Other | Source: Ambulatory Visit | Attending: Cardiovascular Disease | Admitting: Cardiovascular Disease

## 2022-12-03 ENCOUNTER — Ambulatory Visit (HOSPITAL_COMMUNITY)
Admission: RE | Admit: 2022-12-03 | Discharge: 2022-12-03 | Disposition: A | Discharge status: Home / Self Care | Payer: Medicare Other | Source: Ambulatory Visit | Attending: Cardiovascular Disease | Admitting: Cardiovascular Disease

## 2022-12-03 DIAGNOSIS — I7143 Infrarenal abdominal aortic aneurysm, without rupture: Secondary | ICD-10-CM | POA: Diagnosis not present

## 2022-12-03 DIAGNOSIS — I779 Disorder of arteries and arterioles, unspecified: Secondary | ICD-10-CM

## 2022-12-04 ENCOUNTER — Encounter: Payer: Self-pay | Admitting: Pulmonary Disease

## 2022-12-04 ENCOUNTER — Ambulatory Visit: Payer: Medicare Other | Admitting: Pulmonary Disease

## 2022-12-04 VITALS — BP 120/64 | HR 74 | Ht 64.0 in | Wt 181.2 lb

## 2022-12-04 DIAGNOSIS — J432 Centrilobular emphysema: Secondary | ICD-10-CM | POA: Diagnosis not present

## 2022-12-04 MED ORDER — ANORO ELLIPTA 62.5-25 MCG/ACT IN AEPB: 1.0000 | INHALATION_SPRAY | Freq: Every day | RESPIRATORY_TRACT | 11 refills | Status: DC

## 2022-12-04 NOTE — Progress Notes (Signed)
Synopsis: Referred in October 2022 for COPD. [name] patient.  Subjective:   PATIENT ID: Shannon Roberts GENDER: female DOB: [birth year], MRN: [id number]   HPI  Chief Complaint  Patient presents with   Follow-up    Overdue f/u for emphysema. States the Anoro has been working well for her.    Shannon Roberts is a 72 year old woman, former smoker with history of COPD who returns to pulmonary clinic for follow up.  She has been doing well on Anoro ellipta 1 puff daily and as needed combivent. She is using combivent seldomly.   CT Angio Chest from 06/15/22 with no new nodules.   ONO from 2022 showed no SpO2 desturations.  OV 08/07/21 She is currently using Anoro ellipta 1 puff daily. She is using albuterol nebulizer treatments as needed when home or using combivent inhaler as needed when away from home. She is using combivent at least once a day after doing exertional activity. She denies any night time awakenings. She has cough this time of year due to sinus congestion and drainage. She does use flonase as needed. She reports having about 2 exacerbations on average per year.  She is receiving monitoring CT scans for an aneurysm which she does not have any concerning nodules or lesions noted from the most recent scan on 06/26/21.   She has a 70 pack year smoking history and quit in 2015.   Past Medical History:  Diagnosis Date   Allergic rhinitis    Anemia    per hosp. - recently- pt. told that she is anemic   Anxiety    Colitis    COPD (chronic obstructive pulmonary disease) (HCC)    no o2   Depression    Diverticulosis    Female bladder prolapse    Fibroids    GERD (gastroesophageal reflux disease)    Heart murmur    Hx of adenomatous colonic polyps 08/26/2019   Hyperlipemia    Hypertension    IBS (irritable bowel syndrome)    Insomnia    Lip abscess 04/05/2016   Mitral valve prolapse    Peripheral vascular disease ([place])    Pre-diabetes 08/03/2019   Diet  controlled     Right bundle branch block    Sciatic nerve disease    Shortness of breath    Stroke [facility]) March 2015     Family History  Problem Relation Age of Onset   Breast cancer Mother    Ovarian cancer Mother    AAA (abdominal aortic aneurysm) Mother    Emphysema Mother    Lung cancer Father    Emphysema Paternal Aunt    Pancreatic cancer Maternal Grandmother    Diabetes Maternal Aunt    Breast cancer Maternal Aunt    Heart disease Paternal Grandfather    Diabetes Paternal Grandfather    Kidney disease Maternal Aunt    Colon cancer Paternal Aunt    Colon polyps Neg Hx    Esophageal cancer Neg Hx    Rectal cancer Neg Hx    Stomach cancer Neg Hx      Social History   Socioeconomic History   Marital status: Divorced    Spouse name: Not on file   Number of children: 2   Years of education: ASSOCIATES   Highest education level: Not on file  Occupational History   Occupation: Therapist, sports  Tobacco Use   Smoking status: Former    Packs/day: 1.50    Years: 47.00  Total pack years: 70.50    Types: Cigarettes    Start date: 12/03/1966    Quit date: 01/02/2014    Years since quitting: 8.9   Smokeless tobacco: Never   Tobacco comments:    Stopped for 6 months once in 2007  Vaping Use   Vaping Use: Never used  Substance and Sexual Activity   Alcohol use: No    Alcohol/week: 0.0 standard drinks of alcohol   Drug use: No   Sexual activity: Not Currently  Other Topics Concern   Not on file  Social History Narrative   Patient is divorced with 2 children.   Patient is right handed.   Patient has her Associates degree.   Patient drinks 3 cups daily.      [facility] (07/30/17):   Originally from [facility]. Has always lived in [place]. 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 Previously had cockatoo and a [place] for months. Did have mold in her current home that was remediated. Enjoys playing on the computer, reading, & babysitting.     Social  Determinants of Health   Financial Resource Strain: Not on file  Food Insecurity: Not on file  Transportation Needs: Not on file  Physical Activity: Not on file  Stress: Not on file  Social Connections: Not on file  Intimate Partner Violence: Not on file     Allergies  Allergen Reactions   Carvedilol Palpitations    Other reaction(s): palpitations   Buspirone Other (See Comments)    Reaction not recalled Other reaction(s): did not work   Dapsone Other (See Comments)    Flushing  Other reaction(s): flushed   Niacin And Related Other (See Comments)    Flushing    Paroxetine Hcl     Other reaction(s): dizziness, dreams   Septra [Sulfamethoxazole-Trimethoprim] Diarrhea and Nausea And Vomiting   Sertraline Hcl     Other reaction(s): skin crawling fuzzy head   Amoxicillin Rash and Other (See Comments)    Has patient had a PCN reaction causing immediate rash, facial/tongue/throat swelling, SOB or lightheadedness with hypotension: No Has patient had a PCN reaction causing severe rash involving mucus membranes or skin necrosis: No Has patient had a PCN reaction that required hospitalization No Has patient had a PCN reaction occurring within the last 10 years: No If all of the above answers are "NO", then may proceed with Cephalosporin use. Other reaction(s): FLUSHED FEELING   Wellbutrin [Bupropion] Anxiety and Other (See Comments)    Caused the patient to feel "jittery"     Outpatient Medications Prior to Visit  Medication Sig Dispense Refill   amLODipine (NORVASC) 5 MG tablet Take 5 mg by mouth daily.     atenolol (TENORMIN) 50 MG tablet Take 50 mg by mouth daily.     Cholecalciferol (VITAMIN D3) 5000 units CAPS Take 5,000 Units by mouth every evening.      clopidogrel (PLAVIX) 75 MG tablet Take 75 mg by mouth daily.      DULoxetine (CYMBALTA) 30 MG capsule Take 60 mg by mouth daily at 12 noon. Patient take 2 tablets daily.     Evolocumab (REPATHA SURECLICK) XX123456 MG/ML SOAJ  Inject 140 mg into the skin every 14 (fourteen) days. 6 mL 3   ferrous sulfate 325 (65 FE) MG tablet Take 325 mg by mouth daily with breakfast.     fluticasone (FLONASE) 50 MCG/ACT nasal spray Place 1 spray into both nostrils daily.     Ipratropium-Albuterol (COMBIVENT RESPIMAT) 20-100 MCG/ACT  AERS respimat Inhale 1 puff into the lungs every 4 (four) hours as needed for wheezing or shortness of breath. 4 g 11   isosorbide mononitrate (IMDUR) 30 MG 24 hr tablet Take 30 mg by mouth in the morning and at bedtime.  0   lisinopril (PRINIVIL,ZESTRIL) 40 MG tablet Take 40 mg by mouth daily.     LORazepam (ATIVAN) 0.5 MG tablet Take 0.5 mg by mouth See admin instructions. Take 0.5 mg by mouth at bedtime and an additional 0.5 mg once a day as needed for anxiety  0   pantoprazole (PROTONIX) 40 MG tablet Take 40 mg by mouth in the morning and at bedtime.     simvastatin (ZOCOR) 20 MG tablet SMARTSIG:2 Tablet(s) By Mouth Every Evening     umeclidinium-vilanterol (ANORO ELLIPTA) 62.5-25 MCG/ACT AEPB Inhale 1 puff into the lungs daily. 30 each 0   GAVILYTE-G 236 g solution SMARTSIG:Milliliter(s) By Mouth     ondansetron (ZOFRAN) 4 MG tablet Take 4 mg by mouth every 8 (eight) hours as needed.     simvastatin (ZOCOR) 40 MG tablet Take 40 mg by mouth daily.     sucralfate (CARAFATE) 1 g tablet Take 1 tablet (1 g total) by mouth 4 (four) times daily -  with meals and at bedtime for 7 days. 28 tablet 0   umeclidinium-vilanterol (ANORO ELLIPTA) 62.5-25 MCG/ACT AEPB Inhale 1 puff into the lungs daily. 2 each 0   umeclidinium-vilanterol (ANORO ELLIPTA) 62.5-25 MCG/ACT AEPB Inhale 1 puff into the lungs daily. 60 each 0   No facility-administered medications prior to visit.    Review of Systems  Constitutional:  Negative for chills, fever, malaise/fatigue and weight loss.  HENT:  Negative for congestion, sinus pain and sore throat.   Eyes: Negative.   Respiratory:  Positive for shortness of breath. Negative for  cough, hemoptysis, sputum production and wheezing.   Cardiovascular:  Negative for chest pain, palpitations, orthopnea, claudication and leg swelling.  Gastrointestinal:  Negative for abdominal pain, heartburn, nausea and vomiting.  Genitourinary: Negative.   Musculoskeletal:  Negative for joint pain and myalgias.  Skin:  Negative for rash.  Neurological:  Negative for weakness.  Endo/Heme/Allergies: Negative.   Psychiatric/Behavioral: Negative.      Objective:   Vitals:   12/04/22 1451  BP: 120/64  Pulse: 74  SpO2: 99%  Weight: 181 lb 3.2 oz (82.2 kg)  Height: 5' 4"$  (1.626 m)   Physical Exam Constitutional:      General: She is not in acute distress.    Appearance: She is not ill-appearing.  HENT:     Head: Normocephalic and atraumatic.  Eyes:     General: No scleral icterus.    Conjunctiva/sclera: Conjunctivae normal.  Cardiovascular:     Rate and Rhythm: Normal rate and regular rhythm.     Pulses: Normal pulses.     Heart sounds: Normal heart sounds. No murmur heard. Pulmonary:     Effort: Pulmonary effort is normal.     Breath sounds: Normal breath sounds. No wheezing, rhonchi or rales.  Musculoskeletal:     Right lower leg: No edema.     Left lower leg: No edema.  Skin:    General: Skin is warm and dry.  Neurological:     General: No focal deficit present.     Mental Status: She is alert.    CBC    Component Value Date/Time   WBC 7.7 06/15/2022 1555   RBC 3.57 (L) 06/15/2022 1555   HGB  11.5 (L) 06/15/2022 1555   HCT 33.4 (L) 06/15/2022 1555   PLT 280 06/15/2022 1555   MCV 93.6 06/15/2022 1555   MCV 96.0 01/02/2014 1825   MCH 32.2 06/15/2022 1555   MCHC 34.4 06/15/2022 1555   RDW 12.3 06/15/2022 1555   LYMPHSABS 1.4 06/15/2022 1555   MONOABS 0.7 06/15/2022 1555   EOSABS 0.3 06/15/2022 1555   BASOSABS 0.1 06/15/2022 1555      Latest Ref Rng & Units 06/15/2022    3:55 PM 04/17/2022    3:56 PM 06/26/2021   10:33 AM  BMP  Glucose 70 - 99 mg/dL 129   152  152   BUN 8 - 23 mg/dL 17  26  22   $ Creatinine 0.44 - 1.00 mg/dL 1.47  1.44  1.56   Sodium 135 - 145 mmol/L 127  132  134   Potassium 3.5 - 5.1 mmol/L 4.8  4.7  4.7   Chloride 98 - 111 mmol/L 94  100  102   CO2 22 - 32 mmol/L 24  22  22   $ Calcium 8.9 - 10.3 mg/dL 9.9  10.3  10.1    Chest imaging: CTA Chest 06/15/22 Lungs/Pleura: Moderate centrilobular and paraseptal emphysematous changes, upper lung predominant. No suspicious pulmonary nodules. No focal consolidation. No pleural effusion or pneumothorax.  CTA Chest 06/26/21 Mediastinum/Nodes: Unremarkable CT appearance of the thyroid gland. No suspicious mediastinal or hilar adenopathy. No soft tissue mediastinal mass. The thoracic esophagus is unremarkable.   Lungs/Pleura: Moderately severe centrilobular pulmonary emphysema. No suspicious pulmonary mass or nodule.  PFT:    Latest Ref Rng & Units 09/01/2017   11:55 AM  PFT Results  FVC-Pre L 1.92   FVC-Predicted Pre % 61   FVC-Post L 2.23   FVC-Predicted Post % 71   Pre FEV1/FVC % % 52   Post FEV1/FCV % % 56   FEV1-Pre L 1.00   FEV1-Predicted Pre % 42   FEV1-Post L 1.25   DLCO uncorrected ml/min/mmHg 12.39   DLCO UNC% % 49   DLCO corrected ml/min/mmHg 13.68   DLCO COR %Predicted % 54   DLVA Predicted % 68   TLC L 5.89   TLC % Predicted % 114   RV % Predicted % 175   PFT 2018: Moderately Severe Obstruction. Air trapping present. Moderate Diffusion defect.   Labs: Alpha 1 antitrypsin 07/30/17 is MM, level 162  Path:  Echo 06/21/21: LV EF 60-65%. Grade I diastolic dysfunction. RV systolic function is normal. RV size is normal.   Heart Catheterization:  Assessment & Plan:   Centrilobular emphysema (HCC)  Discussion: Shannon Roberts is a 72 year old woman, former smoker with history of COPD who returns to pulmonary clinic for follow up.  She is to continue with Anoro Ellipta 1 puff daily and as needed combivent inhaler use. She is safe to get her covid  vaccine.   She is eligible for lung cancer screening but she is receiving CT chest scans for monitoring of an aortic aneurysm. We will follow these scans in order to perform lung cancer surveillance. No nodules noted 05/2022.  Follow up in 1 year.  Freda Jackson, MD [facility] Office: [phone]   Current Outpatient Medications:    amLODipine (NORVASC) 5 MG tablet, Take 5 mg by mouth daily., Disp: , Rfl:    atenolol (TENORMIN) 50 MG tablet, Take 50 mg by mouth daily., Disp: , Rfl:    Cholecalciferol (VITAMIN D3) 5000 units CAPS, Take 5,000  Units by mouth every evening. , Disp: , Rfl:    clopidogrel (PLAVIX) 75 MG tablet, Take 75 mg by mouth daily. , Disp: , Rfl:    DULoxetine (CYMBALTA) 30 MG capsule, Take 60 mg by mouth daily at 12 noon. Patient take 2 tablets daily., Disp: , Rfl:    Evolocumab (REPATHA SURECLICK) XX123456 MG/ML SOAJ, Inject 140 mg into the skin every 14 (fourteen) days., Disp: 6 mL, Rfl: 3   ferrous sulfate 325 (65 FE) MG tablet, Take 325 mg by mouth daily with breakfast., Disp: , Rfl:    fluticasone (FLONASE) 50 MCG/ACT nasal spray, Place 1 spray into both nostrils daily., Disp: , Rfl:    Ipratropium-Albuterol (COMBIVENT RESPIMAT) 20-100 MCG/ACT AERS respimat, Inhale 1 puff into the lungs every 4 (four) hours as needed for wheezing or shortness of breath., Disp: 4 g, Rfl: 11   isosorbide mononitrate (IMDUR) 30 MG 24 hr tablet, Take 30 mg by mouth in the morning and at bedtime., Disp: , Rfl: 0   lisinopril (PRINIVIL,ZESTRIL) 40 MG tablet, Take 40 mg by mouth daily., Disp: , Rfl:    LORazepam (ATIVAN) 0.5 MG tablet, Take 0.5 mg by mouth See admin instructions. Take 0.5 mg by mouth at bedtime and an additional 0.5 mg once a day as needed for anxiety, Disp: , Rfl: 0   pantoprazole (PROTONIX) 40 MG tablet, Take 40 mg by mouth in the morning and at bedtime., Disp: , Rfl:    simvastatin (ZOCOR) 20 MG tablet, SMARTSIG:2 Tablet(s) By Mouth Every Evening, Disp:  , Rfl:    umeclidinium-vilanterol (ANORO ELLIPTA) 62.5-25 MCG/ACT AEPB, Inhale 1 puff into the lungs daily., Disp: 30 each, Rfl: 0

## 2022-12-04 NOTE — Patient Instructions (Addendum)
Continue anoro ellipta 1 puff daily  Continue as needed combivent for cough, wheezing or dyspnea.   Follow up in 1 year, call sooner if needed

## 2022-12-24 DIAGNOSIS — I129 Hypertensive chronic kidney disease with stage 1 through stage 4 chronic kidney disease, or unspecified chronic kidney disease: Secondary | ICD-10-CM | POA: Diagnosis not present

## 2022-12-24 DIAGNOSIS — E871 Hypo-osmolality and hyponatremia: Secondary | ICD-10-CM | POA: Diagnosis not present

## 2022-12-24 DIAGNOSIS — N1832 Chronic kidney disease, stage 3b: Secondary | ICD-10-CM | POA: Diagnosis not present

## 2022-12-24 DIAGNOSIS — R7303 Prediabetes: Secondary | ICD-10-CM | POA: Diagnosis not present

## 2023-01-01 ENCOUNTER — Telehealth: Payer: Self-pay | Admitting: Cardiovascular Disease

## 2023-01-01 NOTE — Telephone Encounter (Signed)
Pt called in stating her test results from 12/04/22 and very small in [place] and wants to know if someone can resend it in [place]. I informed I'm sure if they can be resent but she wants to know if the nurse can do it.

## 2023-01-01 NOTE — Telephone Encounter (Signed)
Called patient, aware of results.   Would like printed results mailed to her since she is unable to get results via mychart.   Patient verbalized understanding

## 2023-01-22 ENCOUNTER — Other Ambulatory Visit: Payer: Self-pay | Admitting: Cardiovascular Disease

## 2023-01-24 ENCOUNTER — Telehealth: Payer: Self-pay | Admitting: Cardiovascular Disease

## 2023-01-24 NOTE — Telephone Encounter (Signed)
Patient called stating she would like a copy of her AAA duplex and Carotid study she had done back in February sent her [facility] on [facility].

## 2023-01-24 NOTE — Telephone Encounter (Signed)
Patient is aware she will have to sign medical release of information before we can send over medical records.

## 2023-02-19 ENCOUNTER — Telehealth: Payer: Self-pay | Admitting: Cardiovascular Disease

## 2023-02-19 NOTE — Telephone Encounter (Signed)
*  STAT* If patient is at the pharmacy, call can be transferred to refill team.   1. Which medications need to be refilled? (please list name of each medication and dose if known)   Evolocumab (REPATHA SURECLICK) 140 MG/ML SOAJ    2. Which pharmacy/location (including street and city if local pharmacy) is medication to be sent to?  [id number] - [place], [place] - [address] AT [facility] [address]    3. Do they need a 30 day or 90 day supply? 90

## 2023-02-20 MED ORDER — REPATHA SURECLICK 140 MG/ML ~~LOC~~ SOAJ: SUBCUTANEOUS | 3 refills | Status: DC

## 2023-02-20 MED ORDER — REPATHA SURECLICK 140 MG/ML ~~LOC~~ SOAJ: SUBCUTANEOUS | 11 refills | Status: DC

## 2023-02-20 NOTE — Addendum Note (Signed)
Addended by: Malena Peer D on: 02/20/2023 07:35 AM   Modules accepted: Orders

## 2023-03-12 ENCOUNTER — Telehealth: Payer: Self-pay

## 2023-03-12 ENCOUNTER — Other Ambulatory Visit (HOSPITAL_COMMUNITY): Payer: Self-pay

## 2023-03-12 NOTE — Telephone Encounter (Signed)
Pharmacy Patient Advocate Encounter   Received notification from [facility] MEDICARE that prior authorization for REPATHA is needed.    PA submitted on 03/12/23 Key [id number] Status is pending  Haze Rushing, CPhT Pharmacy Patient Advocate Specialist Direct Number: [phone] Fax: [phone]

## 2023-03-12 NOTE — Telephone Encounter (Signed)
Pharmacy Patient Advocate Encounter  Prior Authorization for REPATHA has been approved.    Effective dates: 03/12/23 through 03/11/24  Laneah Luft, CPhT Pharmacy Patient Advocate Specialist Direct Number: [phone] Fax: [phone] 

## 2023-03-25 DIAGNOSIS — I10 Essential (primary) hypertension: Secondary | ICD-10-CM | POA: Diagnosis not present

## 2023-03-25 DIAGNOSIS — E782 Mixed hyperlipidemia: Secondary | ICD-10-CM | POA: Diagnosis not present

## 2023-03-25 DIAGNOSIS — E1169 Type 2 diabetes mellitus with other specified complication: Secondary | ICD-10-CM | POA: Diagnosis not present

## 2023-03-25 DIAGNOSIS — F324 Major depressive disorder, single episode, in partial remission: Secondary | ICD-10-CM | POA: Diagnosis not present

## 2023-03-27 DIAGNOSIS — E1122 Type 2 diabetes mellitus with diabetic chronic kidney disease: Secondary | ICD-10-CM | POA: Diagnosis not present

## 2023-03-27 DIAGNOSIS — I714 Abdominal aortic aneurysm, without rupture, unspecified: Secondary | ICD-10-CM | POA: Diagnosis not present

## 2023-03-27 DIAGNOSIS — J449 Chronic obstructive pulmonary disease, unspecified: Secondary | ICD-10-CM | POA: Diagnosis not present

## 2023-03-27 DIAGNOSIS — I700 Atherosclerosis of aorta: Secondary | ICD-10-CM | POA: Diagnosis not present

## 2023-03-27 DIAGNOSIS — N1832 Chronic kidney disease, stage 3b: Secondary | ICD-10-CM | POA: Diagnosis not present

## 2023-03-27 DIAGNOSIS — F324 Major depressive disorder, single episode, in partial remission: Secondary | ICD-10-CM | POA: Diagnosis not present

## 2023-03-27 DIAGNOSIS — Z0000 Encounter for general adult medical examination without abnormal findings: Secondary | ICD-10-CM | POA: Diagnosis not present

## 2023-04-27 IMAGING — CR DG CHEST 2V
2 series · 2 of 2 positions shown · non-contrast
Comparison: 08/17/2019

CLINICAL DATA: Chest pain

EXAM:
CHEST - 2 VIEW

[chest ap]
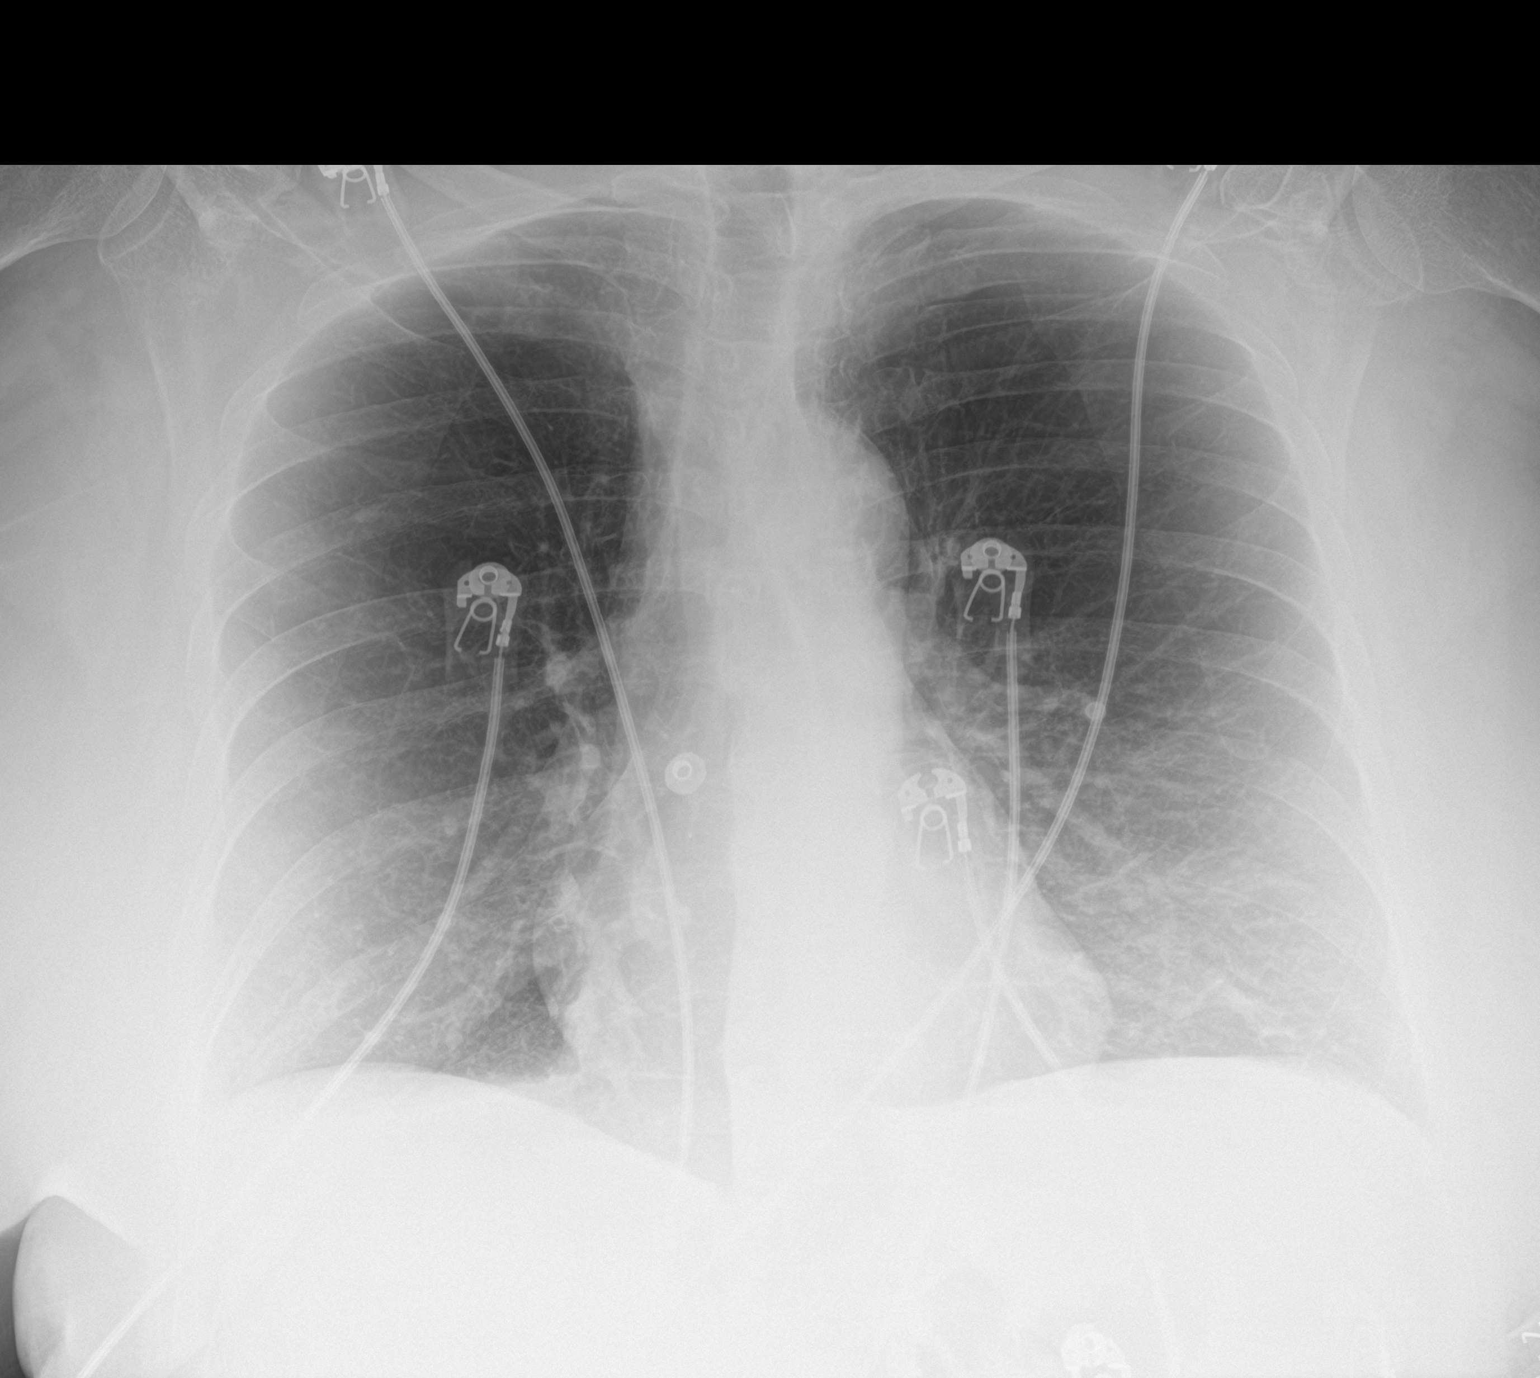

[chest lat]
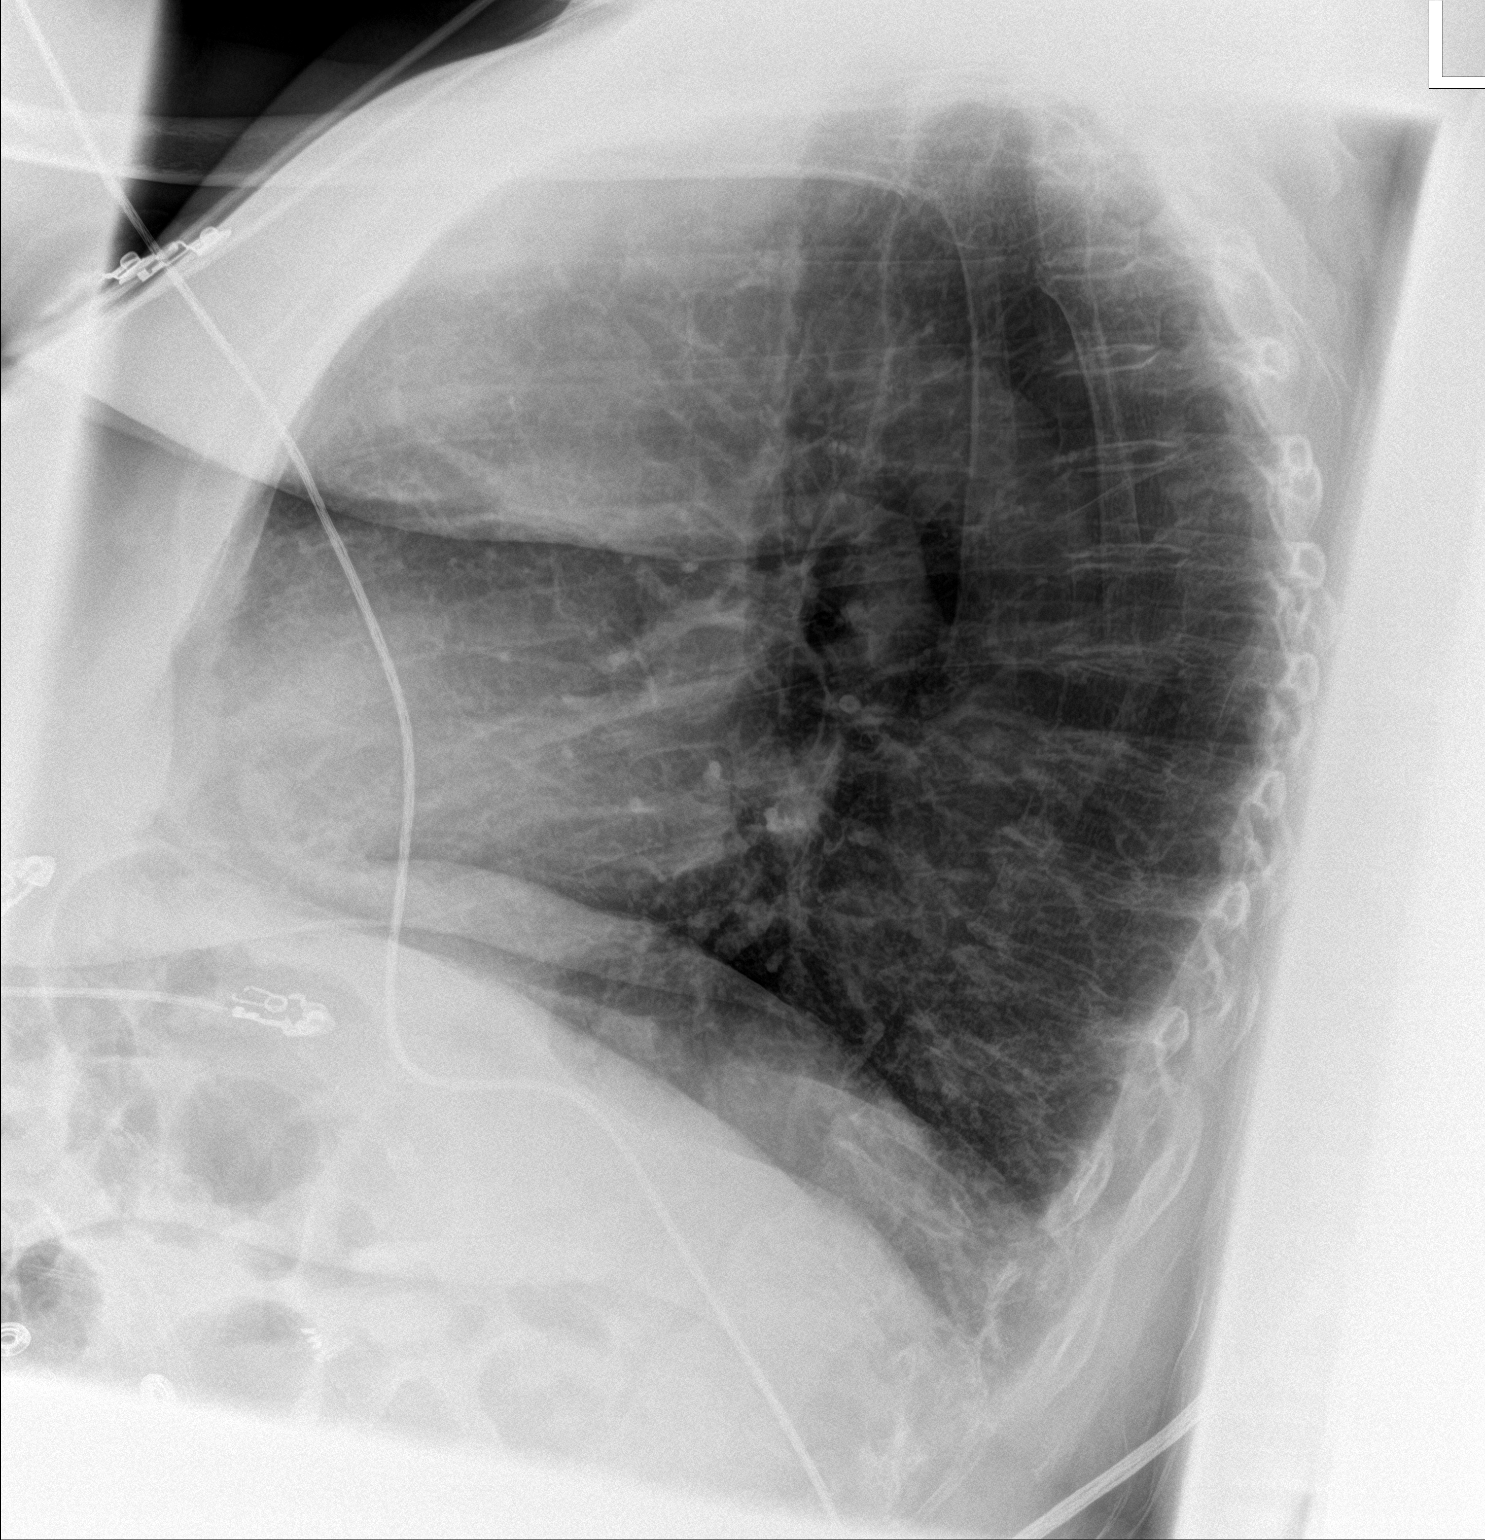

[2 of 2 positions shown; findings below may reference images not displayed]

FINDINGS: Heart and mediastinal contours are within normal limits. No focal
opacities or effusions. No acute bony abnormality.
IMPRESSION: No active cardiopulmonary disease.

## 2023-05-06 ENCOUNTER — Telehealth: Payer: Self-pay | Admitting: Cardiovascular Disease

## 2023-05-06 NOTE — Telephone Encounter (Signed)
Spoke with pt regarding having a pap smear with her abdominal aortic aneurysm. Advised pt that it is ok to have a pap smear. Pt verbalizes understanding.

## 2023-05-06 NOTE — Telephone Encounter (Signed)
Patient is calling to see if with her past diagnosis she is able to have a pap smear. Please advise.

## 2023-06-30 DIAGNOSIS — E039 Hypothyroidism, unspecified: Secondary | ICD-10-CM | POA: Diagnosis not present

## 2023-07-02 DIAGNOSIS — J441 Chronic obstructive pulmonary disease with (acute) exacerbation: Secondary | ICD-10-CM | POA: Diagnosis not present

## 2023-07-14 ENCOUNTER — Telehealth: Payer: Self-pay | Admitting: Cardiovascular Disease

## 2023-07-14 NOTE — Telephone Encounter (Signed)
Patient is asking to get cortisone shots in lower back for slipped disc.  She asked if it is OK to get them. She has first appt next week. Any limitations on how often she can get them or any other concerns

## 2023-07-14 NOTE — Telephone Encounter (Signed)
Information given to patient . She states thank you!

## 2023-07-14 NOTE — Telephone Encounter (Signed)
Pt is requesting a callback regarding her wanting to know if it's ok for her to get cortisone shots in the back due since she has an Aortic Aneurysm. Please advise

## 2023-07-24 DIAGNOSIS — M545 Low back pain, unspecified: Secondary | ICD-10-CM | POA: Diagnosis not present

## 2023-07-30 ENCOUNTER — Other Ambulatory Visit: Payer: Self-pay | Admitting: Orthopedic Surgery

## 2023-07-30 DIAGNOSIS — M545 Low back pain, unspecified: Secondary | ICD-10-CM

## 2023-07-31 ENCOUNTER — Telehealth: Payer: Self-pay | Admitting: Cardiovascular Disease

## 2023-07-31 NOTE — Telephone Encounter (Signed)
Spoke with patient who called with concerns about starting Prednisone. Per the pt Dr Valentino Hue with [facility] wants to start her on the taper dose pak for her degenerative disk disease. Please advise.

## 2023-07-31 NOTE — Telephone Encounter (Signed)
Pt is requesting a callback to see if its okay for her to start on Prednisone. Please advise

## 2023-08-01 NOTE — Telephone Encounter (Signed)
Left detailed message for patient relaying Dr. Darnelle Bos response regarding the prednisone. Provided instructions to call back if she has  further questions. Sent message via mychart as well with Dr. Darnelle Bos response.

## 2023-08-04 NOTE — Telephone Encounter (Signed)
Called to follow up regarding last message. No answer. Left vm.

## 2023-08-07 ENCOUNTER — Ambulatory Visit: Payer: Medicare Other | Admitting: Primary Care

## 2023-08-15 ENCOUNTER — Ambulatory Visit
Admission: RE | Admit: 2023-08-15 | Discharge: 2023-08-15 | Disposition: A | Discharge status: Home / Self Care | Payer: Medicare Other | Source: Ambulatory Visit | Attending: Orthopedic Surgery | Admitting: Orthopedic Surgery

## 2023-08-15 DIAGNOSIS — M4316 Spondylolisthesis, lumbar region: Secondary | ICD-10-CM | POA: Diagnosis not present

## 2023-08-15 DIAGNOSIS — M48061 Spinal stenosis, lumbar region without neurogenic claudication: Secondary | ICD-10-CM | POA: Diagnosis not present

## 2023-08-15 DIAGNOSIS — M545 Low back pain, unspecified: Secondary | ICD-10-CM

## 2023-08-15 DIAGNOSIS — M47816 Spondylosis without myelopathy or radiculopathy, lumbar region: Secondary | ICD-10-CM | POA: Diagnosis not present

## 2023-08-15 DIAGNOSIS — M5126 Other intervertebral disc displacement, lumbar region: Secondary | ICD-10-CM | POA: Diagnosis not present

## 2023-08-20 ENCOUNTER — Telehealth: Payer: Self-pay | Admitting: Cardiovascular Disease

## 2023-08-20 NOTE — Telephone Encounter (Signed)
New Message:      Patient called and said she have been having really bad stomach pains. She said she have not had any pain in the last 2 days. She is very concerned, because she has an Aortic Aneurysm. She wanted to be seen. I made her an appointment for 08-26-23 with Angie Duke. Please call to evaluate.tt

## 2023-08-20 NOTE — Telephone Encounter (Signed)
Left voicemail to return call to office.

## 2023-08-20 NOTE — Telephone Encounter (Signed)
Spoke with patient and she states her stomach has been hurting off and on for two weeks. No pain for the past two days. Abdomen is not distended and soft to touch according to patient. She states she was concerned about her aortic aneurysm. She has appointment with PCP tomorrow. Appointment with APP 11/5. ED precautions discussed. Will forward to provider.

## 2023-08-20 NOTE — Telephone Encounter (Signed)
Agree with PCP. She needs to get a follow-up abdominal aortic ultrasound this year.  Gerri Spore T. Flora Lipps, MD, [facility]  [facility] [address], [address] [place], [place] [address] [phone] 11:08 AM

## 2023-08-20 NOTE — Telephone Encounter (Signed)
Patient returned staff call. 

## 2023-08-20 NOTE — Telephone Encounter (Signed)
Called spoke to  patient . Patient is aware  to keep appointment with Primary for  Friday 08/23/23. If she is satisified with that appointment she can call and cancel 11/6/ 24 appt.   RN schedule annual appointment  for 11/30/22 as recall stated.   Per  original result note from Dr Flora Lipps recheck  AAA in 2 years  ( which  will be 2026)

## 2023-08-22 DIAGNOSIS — R140 Abdominal distension (gaseous): Secondary | ICD-10-CM | POA: Diagnosis not present

## 2023-08-26 DIAGNOSIS — M545 Low back pain, unspecified: Secondary | ICD-10-CM | POA: Diagnosis not present

## 2023-08-27 ENCOUNTER — Ambulatory Visit: Payer: Medicare Other | Admitting: Physician Assistant

## 2023-09-08 DIAGNOSIS — M5430 Sciatica, unspecified side: Secondary | ICD-10-CM | POA: Diagnosis not present

## 2023-09-08 DIAGNOSIS — E1122 Type 2 diabetes mellitus with diabetic chronic kidney disease: Secondary | ICD-10-CM | POA: Diagnosis not present

## 2023-09-08 DIAGNOSIS — F419 Anxiety disorder, unspecified: Secondary | ICD-10-CM | POA: Diagnosis not present

## 2023-09-08 DIAGNOSIS — R109 Unspecified abdominal pain: Secondary | ICD-10-CM | POA: Diagnosis not present

## 2023-11-30 NOTE — Progress Notes (Signed)
 Cardiology Office Note:  .   Date:  12/01/2023  ID:  Shannon Roberts, DOB 08-19-1951, MRN [id number] PCP: Jimmey Mould, MD  [facility] Providers Cardiologist:  None { History of Present Illness: .    Chief Complaint  Patient presents with   Follow-up    12 months.    Shannon Roberts is a 73 y.o. female with below history who presents for follow-up.    History of Present Illness   Shannon Roberts is a 73 year old female with severe COPD, CAD, AAA, carotid artery disease, HLD who presents for follow-up.  She experiences worsening dyspnea, particularly during activities such as vacuuming, which she finds most challenging. Her respiratory status has deteriorated over time, although she attempts to maintain activity by walking on days with favorable weather. No chest pain, pressure, or tightness is reported. She is under the care of a pulmonologist.  She has coronary artery disease, managed medically, with no current symptoms of angina. Her cholesterol levels are well-controlled with Repatha  and simvastatin , with a recent LDL of 32. She is on clopidogrel  due to an aspirin  allergy . Her blood pressure is well-controlled with amlodipine , atenolol , lisinopril , and Imdur . No peripheral edema is noted, but she mentions occasional leg pain.  She has a history of carotid artery disease and underwent a right carotid endarterectomy in 2015. She continues to have yearly neck ultrasounds, with the last one showing stability. She is due for another ultrasound this year.  She has a history of an aortic aneurysm, with the last measurement being 31 mm. She is scheduled for a repeat ultrasound this year to monitor for any changes.  She has moderate left renal artery stenosis, which is managed medically.   EKG shows RBBB which appears to be new.          Problem List COPD AAA -3.1 cm 12/03/2022 3. Moderate L renal artery stenosis  -CTA 05/26/2021 4. CVA -R CEA 01/17/2014  -R/L ICA  1-39% 11/2022 5. Aspirin  intolerant  6. HTN 7. HLD -T chol 100, HDL 41, LDL 32, TG 170 -acute hepatitis on lipitor  8. CAD -coronary calcium  score 1026 (96th percentile) -50-69% mid LCX (CT FFR 0.81 negative) -Distal LCX 0.67 (delta 0.13) 9. COPD -severe  -quit smoking 2015 10. RBBB    ROS: All other ROS reviewed and negative. Pertinent positives noted in the HPI.     Studies Reviewed: Aaron Aas   EKG Interpretation Date/Time:  Monday December 01 2023 15:46:52 EST Ventricular Rate:  79 PR Interval:  174 QRS Duration:  146 QT Interval:  426 QTC Calculation: 488 R Axis:   -55  Text Interpretation: Normal sinus rhythm Left axis deviation Right bundle branch block Confirmed by Jackquelyn Mass [phone]) on 12/01/2023 3:48:21 PM   TTE 06/21/2021  1. Left ventricular ejection fraction, by estimation, is 60 to 65%. The  left ventricle has normal function. The left ventricle has no regional  wall motion abnormalities. Left ventricular diastolic parameters are  consistent with Grade I diastolic  dysfunction (impaired relaxation).   2. Right ventricular systolic function is normal. The right ventricular  size is normal. Tricuspid regurgitation signal is inadequate for assessing  PA pressure.   3. The mitral valve is normal in structure. Trivial mitral valve  regurgitation. No evidence of mitral stenosis.   4. The aortic valve is tricuspid. Aortic valve regurgitation is not  visualized. Mild aortic valve sclerosis is present, with no evidence of  aortic valve stenosis.  5. The inferior vena cava is normal in size with greater than 50%  respiratory variability, suggesting right atrial pressure of 3 mmHg.   6. Trivial pericardial effusion anterior to the right ventricle.   Carotid US  12/03/2022 Summary:  Right Carotid: Velocities in the right ICA are consistent with a 1-39%  stenosis.   Left Carotid: Velocities in the left ICA are consistent with a 1-39%  stenosis.  Vasc AAA  12/03/2022 Summary:  Abdominal Aorta: There is evidence of abnormal dilatation of the distal  Abdominal aorta. The largest aortic measurement is 3.1 cm. No previous  exam available for comparison.  Physical Exam:   VS:  BP 126/72 (BP Location: Right Arm, Patient Position: Sitting, Cuff Size: Normal)   Pulse 79   Ht 5\' 4"  (1.626 m)   Wt 188 lb (85.3 kg)   BMI 32.27 kg/m    Wt Readings from Last 3 Encounters:  12/01/23 188 lb (85.3 kg)  12/04/22 181 lb 3.2 oz (82.2 kg)  06/15/22 180 lb (81.6 kg)    GEN: Well nourished, well developed in no acute distress NECK: No JVD; No carotid bruits CARDIAC: RRR, no murmurs, rubs, gallops RESPIRATORY:  Clear to auscultation without rales, wheezing or rhonchi  ABDOMEN: Soft, non-tender, non-distended EXTREMITIES:  No edema; No deformity  ASSESSMENT AND PLAN: .   Assessment and Plan    Carotid Artery Disease Status post right CEA in 2015. No residual disease last year. -Continue current management. -Order yearly carotid ultrasound.  Abdominal Aortic Aneurysm Stable at 31mm last year. -Order vascular AAA ultrasound. If unchanged, consider extending interval to 2 years.   CAD -nonobstructive by CT FFR -no angina -repeat echo due to RBBB -on plavix . Lipids below.   Hypertension Well controlled on current regimen of amlodipine  5mg  daily, atenolol  50mg  daily, lisinopril  40mg  daily, and Imdur  30mg  daily. -Continue current medication regimen.  Hyperlipidemia Well controlled on Repatha  and simvastatin  40mg  daily. Most recent LDL at goal. -Continue current medication regimen.              Follow-up: Return in about 1 year (around 11/30/2024).  Signed, Gigi Kyle. Rolm Clos, MD, [facility]  [facility]  [address], [address] [place], [place] [address] [phone]  6:11 PM

## 2023-12-01 ENCOUNTER — Ambulatory Visit: Payer: Medicare Other | Attending: Cardiovascular Disease | Admitting: Cardiovascular Disease

## 2023-12-01 ENCOUNTER — Encounter: Payer: Self-pay | Admitting: Cardiovascular Disease

## 2023-12-01 VITALS — BP 126/72 | HR 79 | Ht 64.0 in | Wt 188.0 lb

## 2023-12-01 DIAGNOSIS — I2510 Atherosclerotic heart disease of native coronary artery without angina pectoris: Secondary | ICD-10-CM | POA: Diagnosis not present

## 2023-12-01 DIAGNOSIS — R931 Abnormal findings on diagnostic imaging of heart and coronary circulation: Secondary | ICD-10-CM

## 2023-12-01 DIAGNOSIS — I7143 Infrarenal abdominal aortic aneurysm, without rupture: Secondary | ICD-10-CM | POA: Diagnosis not present

## 2023-12-01 DIAGNOSIS — I779 Disorder of arteries and arterioles, unspecified: Secondary | ICD-10-CM | POA: Diagnosis not present

## 2023-12-01 DIAGNOSIS — R0602 Shortness of breath: Secondary | ICD-10-CM

## 2023-12-01 DIAGNOSIS — I10 Essential (primary) hypertension: Secondary | ICD-10-CM

## 2023-12-01 DIAGNOSIS — E785 Hyperlipidemia, unspecified: Secondary | ICD-10-CM

## 2023-12-01 DIAGNOSIS — E782 Mixed hyperlipidemia: Secondary | ICD-10-CM

## 2023-12-01 DIAGNOSIS — I4510 Unspecified right bundle-branch block: Secondary | ICD-10-CM

## 2023-12-01 NOTE — Patient Instructions (Signed)
 Medication Instructions:  Your physician recommends that you continue on your current medications as directed. Please refer to the Current Medication list given to you today.    *If you need a refill on your cardiac medications before your next appointment, please call your pharmacy*   Lab Work: None    If you have labs (blood work) drawn today and your tests are completely normal, you will receive your results only by: MyChart Message (if you have MyChart) OR A paper copy in the mail If you have any lab test that is abnormal or we need to change your treatment, we will call you to review the results.   Testing/Procedures:  Your physician has requested that you have a carotid duplex. This test is an ultrasound of the carotid arteries in your neck. It looks at blood flow through these arteries that supply the brain with blood. Allow one hour for this exam. There are no restrictions or special instructions.    Your physician has requested that you have an abdominal aorta duplex. During this test, an ultrasound is used to evaluate the aorta. Allow 30 minutes for this exam. Do not eat after midnight the day before and avoid carbonated beverages.  Please note: We ask at that you not bring children with you during ultrasound (echo/ vascular) testing. Due to room size and safety concerns, children are not allowed in the ultrasound rooms during exams. Our front office staff cannot provide observation of children in our lobby area while testing is being conducted. An adult accompanying a patient to their appointment will only be allowed in the ultrasound room at the discretion of the ultrasound technician under special circumstances. We apologize for any inconvenience.    Echo will be scheduled at [address].  Your physician has requested that you have an echocardiogram. Echocardiography is a painless test that uses sound waves to create images of your heart. It provides your  doctor with information about the size and shape of your heart and how well your heart's chambers and valves are working. This procedure takes approximately one hour. There are no restrictions for this procedure. Please do NOT wear cologne, perfume, aftershave, or lotions (deodorant is allowed). Please arrive 15 minutes prior to your appointment time.    Follow-Up: At [facility], you and your health needs are our priority.  As part of our continuing mission to provide you with exceptional heart care, we have created designated Provider Care Teams.  These Care Teams include your primary Cardiologist (physician) and Advanced Practice Providers (APPs -  Physician Assistants and Nurse Practitioners) who all work together to provide you with the care you need, when you need it.  We recommend signing up for the patient portal called "MyChart".  Sign up information is provided on this After Visit Summary.  MyChart is used to connect with patients for Virtual Visits (Telemedicine).  Patients are able to view lab/test results, encounter notes, upcoming appointments, etc.  Non-urgent messages can be sent to your provider as well.   To learn more about what you can do with MyChart, go to [email or web address].    Your next appointment:   1 year(s)  The format for your next appointment:   In Person  Provider:   Lawana Pray, FNP, Marcie Sever, PA-C, Callie Goodrich, PA-C, Kathleen Johnson, PA-C, Phyliss Breen, PA-C, Friddie Jetty, DNP, ANP, Hao Meng, PA-C, Marlana Silvan, NP, or Katlyn West, NP      Other Instructions

## 2023-12-09 DIAGNOSIS — R053 Chronic cough: Secondary | ICD-10-CM | POA: Diagnosis not present

## 2023-12-09 DIAGNOSIS — J029 Acute pharyngitis, unspecified: Secondary | ICD-10-CM | POA: Diagnosis not present

## 2023-12-09 DIAGNOSIS — Z03818 Encounter for observation for suspected exposure to other biological agents ruled out: Secondary | ICD-10-CM | POA: Diagnosis not present

## 2023-12-12 ENCOUNTER — Other Ambulatory Visit: Payer: Self-pay | Admitting: Pulmonary Disease

## 2023-12-12 DIAGNOSIS — J432 Centrilobular emphysema: Secondary | ICD-10-CM

## 2023-12-15 ENCOUNTER — Telehealth: Payer: Self-pay | Admitting: Pulmonary Disease

## 2023-12-15 DIAGNOSIS — J432 Centrilobular emphysema: Secondary | ICD-10-CM

## 2023-12-15 NOTE — Telephone Encounter (Signed)
 Patient states needs refill for Anoro inhaler. Has 2 days left. Pharmacy is [facility] [address]. Patient scheduled with Dr. Francine Graven 02/12/2024. Patient phone number is [phone].

## 2023-12-16 DIAGNOSIS — R059 Cough, unspecified: Secondary | ICD-10-CM | POA: Diagnosis not present

## 2023-12-16 DIAGNOSIS — J449 Chronic obstructive pulmonary disease, unspecified: Secondary | ICD-10-CM | POA: Diagnosis not present

## 2023-12-16 DIAGNOSIS — Z03818 Encounter for observation for suspected exposure to other biological agents ruled out: Secondary | ICD-10-CM | POA: Diagnosis not present

## 2023-12-16 DIAGNOSIS — R509 Fever, unspecified: Secondary | ICD-10-CM | POA: Diagnosis not present

## 2023-12-16 MED ORDER — ANORO ELLIPTA 62.5-25 MCG/ACT IN AEPB: 1.0000 | INHALATION_SPRAY | Freq: Every day | RESPIRATORY_TRACT | 1 refills | Status: DC

## 2023-12-16 NOTE — Telephone Encounter (Signed)
 Anoro fillled NFN

## 2024-01-02 ENCOUNTER — Telehealth: Payer: Self-pay | Admitting: Cardiovascular Disease

## 2024-01-02 NOTE — Telephone Encounter (Signed)
 Spoke to patient she stated she is taking Prednisone for respiratory infection.She wanted to make sure Prednisone would not interfere with dopplers scheduled for this [facility] 3/17.Advised Prednisone would not interfere.She will keep appointments as scheduled.She will call back on Monday at 8:00 am if she does not feel like keeping appointments.

## 2024-01-02 NOTE — Telephone Encounter (Signed)
 Patient calling in with questions concerning her tests on Monday. Please advise

## 2024-01-05 ENCOUNTER — Ambulatory Visit (HOSPITAL_COMMUNITY): Admission: RE | Admit: 2024-01-05 | Payer: Medicare Other | Source: Ambulatory Visit

## 2024-01-05 ENCOUNTER — Ambulatory Visit (HOSPITAL_COMMUNITY): Payer: Medicare Other

## 2024-01-15 ENCOUNTER — Other Ambulatory Visit: Payer: Self-pay | Admitting: Pulmonary Disease

## 2024-01-15 DIAGNOSIS — J432 Centrilobular emphysema: Secondary | ICD-10-CM

## 2024-01-15 NOTE — Telephone Encounter (Signed)
 Copied from CRM [id number]. Topic: Clinical - Medication Refill >> Jan 15, 2024  3:19 PM Nila Nephew wrote: Most Recent Primary Care Visit:   Medication: umeclidinium-vilanterol (ANORO ELLIPTA) 62.5-25 MCG/ACT AEPB  Has the patient contacted their pharmacy? Yes - Pharmacy states must call PCP to fill  Is this the correct pharmacy for this prescription? Yes  This is the patient's preferred pharmacy:  [id number] [place], [place] - [address] AT [facility] [address] [place] [address] Phone: [phone] Fax: [phone]   Has the prescription been filled recently? No  Is the patient out of the medication? Yes  Has the patient been seen for an appointment in the last year OR does the patient have an upcoming appointment? Yes  Can we respond through MyChart? Yes  Agent: Please be advised that Rx refills may take up to 3 business days. We ask that you follow-up with your pharmacy.

## 2024-01-16 ENCOUNTER — Telehealth: Payer: Self-pay | Admitting: Primary Care

## 2024-01-16 NOTE — Telephone Encounter (Signed)
 Called and spoke with Shannon Roberts. I informed pt med refill had been sent in. Also informed pt she would need to keep her upcoming appt so she could continue for refills. Pt verbalized understanding. Nothing further needed.  Handled refill in rx box.

## 2024-01-16 NOTE — Telephone Encounter (Signed)
 Pt called E2C2 and was elevated with the operator so he transferred her to me.   Pharmacy is [facility] [address]. Patient scheduled with Dr. Francine Graven 02/12/2024.   She will be out of her Anoro today and wants it filled today. She said this happens all the time. (E2C[facility] told her we need 24-48 hours).  Patient phone number is [phone].  PT is also upset we only gave her 30 days not 90 days but she understands a courtesy refill will likely be 30 days. Also states the Pharm has sent in two requests.   Important: Please call her to advise when it has been called in today. TY.

## 2024-01-16 NOTE — Telephone Encounter (Signed)
 Pt needs to keep scheduled ov 4/24 for further refills.

## 2024-01-23 ENCOUNTER — Emergency Department (HOSPITAL_COMMUNITY)
Admission: EM | Admit: 2024-01-23 | Discharge: 2024-01-23 | Disposition: A | Discharge status: Home / Self Care | Attending: Emergency Medicine | Admitting: Emergency Medicine

## 2024-01-23 ENCOUNTER — Other Ambulatory Visit: Payer: Self-pay

## 2024-01-23 ENCOUNTER — Encounter (HOSPITAL_COMMUNITY): Payer: Self-pay

## 2024-01-23 ENCOUNTER — Emergency Department (HOSPITAL_COMMUNITY)

## 2024-01-23 DIAGNOSIS — Z7951 Long term (current) use of inhaled steroids: Secondary | ICD-10-CM | POA: Diagnosis not present

## 2024-01-23 DIAGNOSIS — J441 Chronic obstructive pulmonary disease with (acute) exacerbation: Secondary | ICD-10-CM | POA: Insufficient documentation

## 2024-01-23 DIAGNOSIS — Z7902 Long term (current) use of antithrombotics/antiplatelets: Secondary | ICD-10-CM | POA: Insufficient documentation

## 2024-01-23 DIAGNOSIS — R0602 Shortness of breath: Secondary | ICD-10-CM | POA: Diagnosis not present

## 2024-01-23 LAB — CBC WITH DIFFERENTIAL/PLATELET
Abs Immature Granulocytes: 0.22 10*3/uL — ABNORMAL HIGH (ref 0.00–0.07)
Basophils Absolute: 0.1 10*3/uL (ref 0.0–0.1)
Basophils Relative: 1 %
Eosinophils Absolute: 0.3 10*3/uL (ref 0.0–0.5)
Eosinophils Relative: 3 %
HCT: 34.4 % — ABNORMAL LOW (ref 36.0–46.0)
Hemoglobin: 11.6 g/dL — ABNORMAL LOW (ref 12.0–15.0)
Immature Granulocytes: 3 %
Lymphocytes Relative: 17 %
Lymphs Abs: 1.4 10*3/uL (ref 0.7–4.0)
MCH: 32.2 pg (ref 26.0–34.0)
MCHC: 33.7 g/dL (ref 30.0–36.0)
MCV: 95.6 fL (ref 80.0–100.0)
Monocytes Absolute: 0.8 10*3/uL (ref 0.1–1.0)
Monocytes Relative: 10 %
Neutro Abs: 5.3 10*3/uL (ref 1.7–7.7)
Neutrophils Relative %: 66 %
Platelets: 299 10*3/uL (ref 150–400)
RBC: 3.6 MIL/uL — ABNORMAL LOW (ref 3.87–5.11)
RDW: 12.6 % (ref 11.5–15.5)
WBC: 8 10*3/uL (ref 4.0–10.5)
nRBC: 0 % (ref 0.0–0.2)

## 2024-01-23 LAB — TROPONIN I (HIGH SENSITIVITY): Troponin I (High Sensitivity): 7 ng/L (ref <18)

## 2024-01-23 LAB — COMPREHENSIVE METABOLIC PANEL WITH GFR
ALT: 13 U/L (ref 0–44)
AST: 19 U/L (ref 15–41)
Albumin: 4.3 g/dL (ref 3.5–5.0)
Alkaline Phosphatase: 61 U/L (ref 38–126)
Anion gap: 10 (ref 5–15)
BUN: 23 mg/dL (ref 8–23)
CO2: 23 mmol/L (ref 22–32)
Calcium: 9.7 mg/dL (ref 8.9–10.3)
Chloride: 96 mmol/L — ABNORMAL LOW (ref 98–111)
Creatinine, Ser: 1.37 mg/dL — ABNORMAL HIGH (ref 0.44–1.00)
GFR, Estimated: 41 mL/min — ABNORMAL LOW (ref >60)
Glucose, Bld: 181 mg/dL — ABNORMAL HIGH (ref 70–99)
Potassium: 4.3 mmol/L (ref 3.5–5.1)
Sodium: 129 mmol/L — ABNORMAL LOW (ref 135–145)
Total Bilirubin: 0.5 mg/dL (ref 0.0–1.2)
Total Protein: 7.8 g/dL (ref 6.5–8.1)

## 2024-01-23 LAB — RESP PANEL BY RT-PCR (RSV, FLU A&B, COVID)  RVPGX2
Influenza A by PCR: NEGATIVE
Influenza B by PCR: NEGATIVE
Resp Syncytial Virus by PCR: NEGATIVE
SARS Coronavirus 2 by RT PCR: NEGATIVE

## 2024-01-23 MED ORDER — METHYLPREDNISOLONE SODIUM SUCC 125 MG IJ SOLR
125.0000 mg | Freq: Once | INTRAMUSCULAR | Status: AC
  Administered 2024-01-23: 125 mg via INTRAVENOUS
  Filled 2024-01-23: qty 2

## 2024-01-23 MED ORDER — PREDNISONE 10 MG (21) PO TBPK
ORAL_TABLET | ORAL | 0 refills | Status: AC
Start: 2024-01-23 — End: 2024-02-04

## 2024-01-23 MED ORDER — IPRATROPIUM-ALBUTEROL 0.5-2.5 (3) MG/3ML IN SOLN
3.0000 mL | Freq: Once | RESPIRATORY_TRACT | Status: AC
  Administered 2024-01-23: 3 mL via RESPIRATORY_TRACT
  Filled 2024-01-23: qty 3

## 2024-01-23 MED ORDER — MAGNESIUM SULFATE 2 GM/50ML IV SOLN
2.0000 g | Freq: Once | INTRAVENOUS | Status: AC
  Administered 2024-01-23: 2 g via INTRAVENOUS
  Filled 2024-01-23: qty 50

## 2024-01-23 NOTE — Discharge Instructions (Addendum)
 Please follow-up with your primary care doctor, I believe that you are having a COPD exacerbation.  I have started you on steroids, I recommend that you talk to your pulmonologist, further, to talk about changing your medications versus having a nebulizer at home, versus having a home oxygen.  You need to talk to your primary care doctor about getting a nebulizer at home, as well as starting you on DuoNebs.  Please return to the ER if you have worsening shortness of breath, or any chest pain.

## 2024-01-23 NOTE — ED Provider Notes (Signed)
 [facility] AT [facility] Provider Note   CSN: [id number] Arrival date & time: 01/23/24  1610     History  Chief Complaint  Patient presents with   Shortness of Breath    Shannon Roberts is a 73 y.o. female, hx of COPD, infrarenal aorta, who presents to the ED 2/2 to SOBx3 months.  States that for the last 3 months, she has been on 3 different antibiotics, steroids, with minimal relief, until her last dose, with doxycycline, and steroids, which she had a great amount of relief for couple weeks, and then it reoccurred a couple weeks ago.  She states she is more short of breath on exertion.  She denies any chest pain, but does state that she has a achiness, in her thoracic spine.  Denies any swelling of the limbs, or recent plane trips.  Does not have any nausea, vomiting.  Notes that she has been taking her maintenance inhaler, as instructed, and her Combivent 1-2 times a day, with minimal relief.    Home Medications Prior to Admission medications   Medication Sig Start Date End Date Taking? Authorizing Provider  predniSONE (STERAPRED UNI-PAK 21 TAB) 10 MG (21) TBPK tablet Take 6 tablets (60 mg total) by mouth daily for 2 days, THEN 5 tablets (50 mg total) daily for 2 days, THEN 4 tablets (40 mg total) daily for 2 days, THEN 3 tablets (30 mg total) daily for 2 days, THEN 2 tablets (20 mg total) daily for 2 days, THEN 1 tablet (10 mg total) daily for 2 days. 01/23/24 02/04/24 Yes Rosabelle Jupin L, PA  amLODipine (NORVASC) 5 MG tablet Take 5 mg by mouth daily. 03/11/21   [provider]  atenolol (TENORMIN) 50 MG tablet Take 50 mg by mouth daily.    [provider]  Cholecalciferol (VITAMIN D3) 5000 units CAPS Take 5,000 Units by mouth every evening.     [provider]  clopidogrel (PLAVIX) 75 MG tablet Take 75 mg by mouth daily.     [provider]  DULoxetine (CYMBALTA) 30 MG capsule Take 60 mg by mouth daily at 12 noon. Patient  take 2 tablets daily.    [provider]  Evolocumab (REPATHA SURECLICK) 140 MG/ML SOAJ INJECT 140 MG INTO THE SKIN EVERY 14 DAYS. 02/20/23   O'Neal, Ronnald Ramp, MD  ferrous sulfate 325 (65 FE) MG tablet Take 325 mg by mouth daily with breakfast.    [provider]  fluticasone (FLONASE) 50 MCG/ACT nasal spray Place 1 spray into both nostrils daily.    [provider]  Ipratropium-Albuterol (COMBIVENT RESPIMAT) 20-100 MCG/ACT AERS respimat Inhale 1 puff into the lungs every 4 (four) hours as needed for wheezing or shortness of breath. 08/07/21   Martina Sinner, MD  isosorbide mononitrate (IMDUR) 30 MG 24 hr tablet Take 30 mg by mouth in the morning and at bedtime. 09/24/14   [provider]  lisinopril (PRINIVIL,ZESTRIL) 40 MG tablet Take 40 mg by mouth daily.    [provider]  LORazepam (ATIVAN) 0.5 MG tablet Take 0.5 mg by mouth See admin instructions. Take 0.5 mg by mouth at bedtime and an additional 0.5 mg once a day as needed for anxiety    [provider]  pantoprazole (PROTONIX) 40 MG tablet Take 40 mg by mouth in the morning and at bedtime.    [provider]  simvastatin (ZOCOR) 20 MG tablet Take 40 mg by mouth daily at 6 PM.  06/06/22   [provider]  umeclidinium-vilanterol (ANORO ELLIPTA) 62.5-25 MCG/ACT AEPB INHALE 1 PUFF INTO THE LUNGS DAILY. 01/16/24   Martina Sinner, MD      Allergies    Carvedilol, Buspirone, Dapsone, Niacin and related, Paroxetine hcl, Septra [sulfamethoxazole-trimethoprim], Sertraline hcl, Amoxicillin, and Wellbutrin [bupropion]    Review of Systems   Review of Systems  Respiratory:  Positive for shortness of breath.     Physical Exam Updated Vital Signs BP 127/76   Pulse 77   Temp 98.3 F (36.8 C)   Resp 17   Ht 5\' 4"  (1.626 m)   Wt 84.8 kg   SpO2 99%   BMI 32.10 kg/m  Physical Exam Vitals and nursing note reviewed.  Constitutional:      General: She is not in  acute distress.    Appearance: She is well-developed.  HENT:     Head: Normocephalic and atraumatic.  Eyes:     Conjunctiva/sclera: Conjunctivae normal.  Cardiovascular:     Rate and Rhythm: Normal rate and regular rhythm.     Heart sounds: No murmur heard. Pulmonary:     Effort: Pulmonary effort is normal. No respiratory distress.     Breath sounds: Decreased breath sounds present.  Abdominal:     Palpations: Abdomen is soft.     Tenderness: There is no abdominal tenderness.  Musculoskeletal:        General: No swelling.     Cervical back: Neck supple.  Skin:    General: Skin is warm and dry.     Capillary Refill: Capillary refill takes less than 2 seconds.  Neurological:     Mental Status: She is alert.  Psychiatric:        Mood and Affect: Mood normal.     ED Results / Procedures / Treatments   Labs (all labs ordered are listed, but only abnormal results are displayed) Labs Reviewed  CBC WITH DIFFERENTIAL/PLATELET - Abnormal; Notable for the following components:      Result Value   RBC 3.60 (*)    Hemoglobin 11.6 (*)    HCT 34.4 (*)    Abs Immature Granulocytes 0.22 (*)    All other components within normal limits  COMPREHENSIVE METABOLIC PANEL WITH GFR - Abnormal; Notable for the following components:   Sodium 129 (*)    Chloride 96 (*)    Glucose, Bld 181 (*)    Creatinine, Ser 1.37 (*)    GFR, Estimated 41 (*)    All other components within normal limits  RESP PANEL BY RT-PCR (RSV, FLU A&B, COVID)  RVPGX2  TROPONIN I (HIGH SENSITIVITY)    EKG None  Radiology DG Chest 2 View Result Date: 01/23/2024 CLINICAL DATA:  Shortness of breath EXAM: CHEST - 2 VIEW COMPARISON:  12/16/2023 FINDINGS: The heart size and mediastinal contours are within normal limits. Both lungs are clear. The visualized skeletal structures are unremarkable. IMPRESSION: No active cardiopulmonary disease. Electronically Signed   By: Jasmine Pang M.D.   On: 01/23/2024 18:13     Procedures Procedures    Medications Ordered in ED Medications  ipratropium-albuterol (DUONEB) 0.5-2.5 (3) MG/3ML nebulizer solution 3 mL (3 mLs Nebulization Given 01/23/24 1744)  ipratropium-albuterol (DUONEB) 0.5-2.5 (3) MG/3ML nebulizer solution 3 mL (3 mLs Nebulization Given 01/23/24 1744)  magnesium sulfate IVPB 2 g 50 mL (0 g Intravenous Stopped 01/23/24 1845)  methylPREDNISolone sodium succinate (SOLU-MEDROL) 125 mg/2 mL injection 125 mg (125 mg Intravenous Given 01/23/24 1744)    ED Course/  Medical Decision Making/ A&P Clinical Course as of 01/23/24 2017  Fri Jan 23, 2024  1705 Stable 89 YOF with SOB x 4 months. Has taken abx multiple times. COPD  [CC]    Clinical Course User Index [CC] Glyn Ade, MD                                 Medical Decision Making Patient is a 73 year old female, here for shortness of breath, this been going on for the last few months, she states she gets better after antibiotics, and steroids, but states that she is feeling bad, 2 weeks after having her last last antibiotics.  She is overall well-appearing, has diminished breath sounds, is not tachypneic, 100% on room air.  Has not had any recent injuries or surgeries.  We will obtain chest x-ray, troponin, and blood work for further evaluation we will give her 2 nebulizers, and magnesium as well as steroids, to help with her symptoms.  And reassess.  I spoke with Dr. Doran Durand, who recommended this course of action.  Amount and/or Complexity of Data Reviewed Labs: ordered.    Details: Troponin within normal limits, no other acute findings Radiology: ordered.    Details: Chest x-ray clear ECG/medicine tests:  Decision-making details documented in ED Course. Discussion of management or test interpretation with external provider(s): Patient's labs were overall reassuring chest x-ray is clear no evidence of any kind of large mass, or opacity.  Her lung sounds are much better, after 2 nebulizers,  steroids, and magnesium.  She feels like she is able to breathe.  She does have end-stage COPD, I believe that this is likely secondary to her worsening COPD, and that she needs to follow-up with her pulmonologist.  It also may represent an exacerbation, however I believe that she is being undertreated.  With her maintenance inhaler, and her Combivent.  I believe that she likely needs a nebulizing machine, which she says she will speak to her primary care doctor about in the a.m., as well as some DuoNebs.  We will start her on steroids, for outpatient, while she ranges a nebulizer machine with her primary care doctor which she adamantly states that she will do.  I also recommend her talking to her pulmonologist about possible home oxygen, as I believe her COPD is getting worse  Risk Prescription drug management.   Final Clinical Impression(s) / ED Diagnoses Final diagnoses:  COPD exacerbation (HCC)    Rx / DC Orders ED Discharge Orders          Ordered    predniSONE (STERAPRED UNI-PAK 21 TAB) 10 MG (21) TBPK tablet  Daily        01/23/24 2014              Pete Pelt, [place] 01/23/24 2017    Glyn Ade, MD 01/24/24 1300

## 2024-01-23 NOTE — ED Triage Notes (Signed)
 Pt has been SOB for 2 weeks, has taken 3 rounds of abx and steroids. Pt reports she has been diaphoretic for 2 weeks as well. Pt has had no improvement in sx. Respirations equal and unlabored in triage Hx of COPD

## 2024-01-26 ENCOUNTER — Encounter: Payer: Self-pay | Admitting: Pulmonary Disease

## 2024-01-29 ENCOUNTER — Other Ambulatory Visit: Payer: Self-pay

## 2024-01-29 DIAGNOSIS — J449 Chronic obstructive pulmonary disease, unspecified: Secondary | ICD-10-CM

## 2024-01-29 MED ORDER — ALBUTEROL SULFATE (2.5 MG/3ML) 0.083% IN NEBU: 2.5000 mg | INHALATION_SOLUTION | Freq: Four times a day (QID) | RESPIRATORY_TRACT | 5 refills | Status: DC | PRN

## 2024-02-03 ENCOUNTER — Telehealth: Payer: Self-pay

## 2024-02-03 DIAGNOSIS — J449 Chronic obstructive pulmonary disease, unspecified: Secondary | ICD-10-CM

## 2024-02-03 NOTE — Telephone Encounter (Signed)
 Copied from CRM [id number]. Topic: Clinical - Medication Question >> Feb 02, 2024  2:13 PM Eveleen Hinds B wrote: Reason for CRM:  Patient called regarding her nebulizer machine. Will it be delivered?  Also patient is taking  Anora Elipta and Combivent. Is that okay to take with the Nebulizer?? Spoke with patient regarding prior message . Advised patient I'm unsure if they will deliver the machine . Advised patient not to take her neb and inhalers together . Patient 's voice  was understanding.Nothing else further needed.

## 2024-02-03 NOTE — Telephone Encounter (Unsigned)
 Copied from CRM [id number]. Topic: Clinical - Medication Question >> Feb 02, 2024  2:13 PM [facility] wrote: Reason for CRM:  Patient called regarding her nebulizer machine. Will it be delivered?  Also patient is taking  Anora Elipta and Combivent. Is that okay to take with the Nebulizer??

## 2024-02-03 NOTE — Telephone Encounter (Signed)
 [facility] - I don't see an order for nab machine?

## 2024-02-03 NOTE — Telephone Encounter (Signed)
 Copied from CRM [id number]. Topic: Clinical - Medication Question >> Feb 02, 2024  2:13 PM [facility] B wrote: Reason for CRM:  Patient called regarding her nebulizer machine. Will it be delivered?  Also patient is taking  Anora Elipta and Combivent. Is that okay to take with the Nebulizer??

## 2024-02-10 ENCOUNTER — Telehealth: Payer: Self-pay

## 2024-02-10 NOTE — Telephone Encounter (Signed)
 I don't see an order for Neb machine unless it was ordered from the hospital

## 2024-02-10 NOTE — Telephone Encounter (Signed)
 Copied from CRM [id number]. Topic: Clinical - Medication Question >> Feb 02, 2024  2:13 PM [facility] B wrote: Reason for CRM:  Patient called regarding her nebulizer machine. Will it be delivered?  Also patient is taking  Anora Elipta and Combivent . Is that okay to take with the Nebulizer?? >> Feb 10, 2024 11:12 AM [facility] Roberts wrote: Patient is returning Shannon Roberts's call, regarding nebulizer. Unable to reached CAL. Please call back at [phone].   Spoke with patient regarding prior message.Patient stated she has not received her nebulizer .Advised patient I will reach out to the [facility] for help .

## 2024-02-10 NOTE — Telephone Encounter (Signed)
ATC X1 LVM for patient to call our office back

## 2024-02-11 ENCOUNTER — Other Ambulatory Visit: Payer: Self-pay

## 2024-02-11 DIAGNOSIS — J449 Chronic obstructive pulmonary disease, unspecified: Secondary | ICD-10-CM

## 2024-02-11 NOTE — Telephone Encounter (Signed)
 New order has been placed .  Nothing else further needed.

## 2024-02-12 ENCOUNTER — Ambulatory Visit: Payer: Medicare Other | Admitting: Pulmonary Disease

## 2024-02-12 ENCOUNTER — Encounter: Payer: Self-pay | Admitting: Pulmonary Disease

## 2024-02-12 ENCOUNTER — Telehealth: Payer: Self-pay

## 2024-02-12 VITALS — BP 124/60 | HR 97 | Ht 60.0 in | Wt 186.7 lb

## 2024-02-12 DIAGNOSIS — J449 Chronic obstructive pulmonary disease, unspecified: Secondary | ICD-10-CM

## 2024-02-12 DIAGNOSIS — J432 Centrilobular emphysema: Secondary | ICD-10-CM | POA: Diagnosis not present

## 2024-02-12 MED ORDER — ANORO ELLIPTA 62.5-25 MCG/ACT IN AEPB: 1.0000 | INHALATION_SPRAY | Freq: Every day | RESPIRATORY_TRACT | 5 refills | Status: DC

## 2024-02-12 NOTE — Patient Instructions (Addendum)
 Continue anoro inhaler 1 puff daily  Use combivent  inahler every 4-6 hours as needed  We will work on getting you a nebulizer machine  Follow up in 6 months, call sooner if needed

## 2024-02-12 NOTE — Telephone Encounter (Signed)
 ATC x1 LVM for patient to call our office back regarding prior message . Will send patient a mychart message.

## 2024-02-12 NOTE — Progress Notes (Unsigned)
 Synopsis: Referred in October 2022 for COPD. Former Dr. Fulton Job patient.  Subjective:   PATIENT ID: Shannon Roberts GENDER: female DOB: 1951/01/24, MRN: [id number]   HPI  No chief complaint on file.  Shannon Roberts is a 73 year old woman, former smoker with history of COPD who returns to pulmonary clinic for follow up.  She has been doing well on Anoro ellipta  1 puff daily and as needed combivent . She is using combivent  seldomly.   CT Angio Chest from 06/15/22 with no new nodules.   ONO from 2022 showed no SpO2 desturations.  OV 08/07/21 She is currently using Anoro ellipta  1 puff daily. She is using albuterol  nebulizer treatments as needed when home or using combivent  inhaler as needed when away from home. She is using combivent  at least once a day after doing exertional activity. She denies any night time awakenings. She has cough this time of year due to sinus congestion and drainage. She does use flonase  as needed. She reports having about 2 exacerbations on average per year.  She is receiving monitoring CT scans for an aneurysm which she does not have any concerning nodules or lesions noted from the most recent scan on 06/26/21.   She has a 70 pack year smoking history and quit in 2015.   Past Medical History:  Diagnosis Date   Allergic rhinitis    Anemia    per hosp. - recently- pt. told that she is anemic   Anxiety    Colitis    COPD (chronic obstructive pulmonary disease) (HCC)    no o2   Depression    Diverticulosis    Female bladder prolapse    Fibroids    GERD (gastroesophageal reflux disease)    Heart murmur    Hx of adenomatous colonic polyps 08/26/2019   Hyperlipemia    Hypertension    IBS (irritable bowel syndrome)    Insomnia    Lip abscess 04/05/2016   Mitral valve prolapse    Peripheral vascular disease (HCC)    Pre-diabetes 08/03/2019   Diet controlled     Right bundle branch block    Sciatic nerve disease    Shortness of breath    Stroke [facility])  March 2015     Family History  Problem Relation Age of Onset   Breast cancer Mother    Ovarian cancer Mother    AAA (abdominal aortic aneurysm) Mother    Emphysema Mother    Lung cancer Father    Emphysema Paternal Aunt    Pancreatic cancer Maternal Grandmother    Diabetes Maternal Aunt    Breast cancer Maternal Aunt    Heart disease Paternal Grandfather    Diabetes Paternal Grandfather    Kidney disease Maternal Aunt    Colon cancer Paternal Aunt    Colon polyps Neg Hx    Esophageal cancer Neg Hx    Rectal cancer Neg Hx    Stomach cancer Neg Hx      Social History   Socioeconomic History   Marital status: Divorced    Spouse name: Not on file   Number of children: 2   Years of education: ASSOCIATES   Highest education level: Not on file  Occupational History   Occupation: Dance movement psychotherapist  Tobacco Use   Smoking status: Former    Current packs/day: 0.00    Average packs/day: 1.5 packs/day for 47.1 years (70.6 ttl pk-yrs)    Types: Cigarettes    Start date: 12/03/1966    Quit date:  01/02/2014    Years since quitting: 10.1   Smokeless tobacco: Never   Tobacco comments:    Stopped for 6 months once in 2007  Vaping Use   Vaping status: Never Used  Substance and Sexual Activity   Alcohol use: No    Alcohol/week: 0.0 standard drinks of alcohol   Drug use: No   Sexual activity: Not Currently  Other Topics Concern   Not on file  Social History Narrative   Patient is divorced with 2 children.   Patient is right handed.   Patient has her Associates degree.   Patient drinks 3 cups daily.      [facility] (07/30/17):   Originally from [place] [place] [facility]. Has always lived in [place]. Previously worked as a Catering manager. Has a foster hospice dog. Previously had cockatoo and a [place] for months. Did have mold in her current home that was remediated. Enjoys playing on the computer, reading, & babysitting.     Social Drivers of Corporate investment banker Strain: Not on file  Food Insecurity:  Not on file  Transportation Needs: Not on file  Physical Activity: Not on file  Stress: Not on file  Social Connections: Not on file  Intimate Partner Violence: Not on file     Allergies  Allergen Reactions   Carvedilol Palpitations    Other reaction(s): palpitations   Buspirone Other (See Comments)    Reaction not recalled Other reaction(s): did not work   Dapsone Other (See Comments)    Flushing  Other reaction(s): flushed   Niacin And Related Other (See Comments)    Flushing    Paroxetine Hcl     Other reaction(s): dizziness, dreams   Septra [Sulfamethoxazole-Trimethoprim] Diarrhea and Nausea And Vomiting   Sertraline Hcl     Other reaction(s): skin crawling fuzzy head   Amoxicillin Rash and Other (See Comments)    Has patient had a PCN reaction causing immediate rash, facial/tongue/throat swelling, SOB or lightheadedness with hypotension: No Has patient had a PCN reaction causing severe rash involving mucus membranes or skin necrosis: No Has patient had a PCN reaction that required hospitalization No Has patient had a PCN reaction occurring within the last 10 years: No If all of the above answers are "NO", then may proceed with Cephalosporin use. Other reaction(s): FLUSHED FEELING   Wellbutrin [Bupropion] Anxiety and Other (See Comments)    Caused the patient to feel "jittery"     Outpatient Medications Prior to Visit  Medication Sig Dispense Refill   albuterol  (PROVENTIL ) (2.5 MG/3ML) 0.083% nebulizer solution Take 3 mLs (2.5 mg total) by nebulization every 6 (six) hours as needed for wheezing or shortness of breath. 75 mL 5   amLODipine  (NORVASC ) 5 MG tablet Take 5 mg by mouth daily.     atenolol  (TENORMIN ) 50 MG tablet Take 50 mg by mouth daily.     Cholecalciferol (VITAMIN D3) 5000 units CAPS Take 5,000 Units by mouth every evening.      clopidogrel  (PLAVIX ) 75 MG tablet Take 75 mg by mouth daily.      DULoxetine  (CYMBALTA ) 30 MG capsule Take 60 mg by mouth daily at  12 noon. Patient take 2 tablets daily.     Evolocumab  (REPATHA  SURECLICK) 140 MG/ML SOAJ INJECT 140 MG INTO THE SKIN EVERY 14 DAYS. 6 mL 3   ferrous sulfate 325 (65 FE) MG tablet Take 325 mg by mouth daily with breakfast.     fluticasone  (FLONASE ) 50 MCG/ACT nasal spray Place 1 spray into both  nostrils daily.     Ipratropium-Albuterol  (COMBIVENT  RESPIMAT) 20-100 MCG/ACT AERS respimat Inhale 1 puff into the lungs every 4 (four) hours as needed for wheezing or shortness of breath. 4 g 11   isosorbide  mononitrate (IMDUR ) 30 MG 24 hr tablet Take 30 mg by mouth in the morning and at bedtime.  0   lisinopril  (PRINIVIL ,ZESTRIL ) 40 MG tablet Take 40 mg by mouth daily.     LORazepam  (ATIVAN ) 0.5 MG tablet Take 0.5 mg by mouth See admin instructions. Take 0.5 mg by mouth at bedtime and an additional 0.5 mg once a day as needed for anxiety  0   pantoprazole  (PROTONIX ) 40 MG tablet Take 40 mg by mouth in the morning and at bedtime.     simvastatin  (ZOCOR ) 20 MG tablet Take 40 mg by mouth daily at 6 PM.     umeclidinium-vilanterol (ANORO ELLIPTA ) 62.5-25 MCG/ACT AEPB INHALE 1 PUFF INTO THE LUNGS DAILY. 60 each 0   No facility-administered medications prior to visit.    Review of Systems  Constitutional:  Negative for chills, fever, malaise/fatigue and weight loss.  HENT:  Negative for congestion, sinus pain and sore throat.   Eyes: Negative.   Respiratory:  Positive for shortness of breath. Negative for cough, hemoptysis, sputum production and wheezing.   Cardiovascular:  Negative for chest pain, palpitations, orthopnea, claudication and leg swelling.  Gastrointestinal:  Negative for abdominal pain, heartburn, nausea and vomiting.  Genitourinary: Negative.   Musculoskeletal:  Negative for joint pain and myalgias.  Skin:  Negative for rash.  Neurological:  Negative for weakness.  Endo/Heme/Allergies: Negative.   Psychiatric/Behavioral: Negative.      Objective:   There were no vitals filed for this  visit.  Physical Exam Constitutional:      General: She is not in acute distress.    Appearance: She is not ill-appearing.  HENT:     Head: Normocephalic and atraumatic.  Eyes:     General: No scleral icterus.    Conjunctiva/sclera: Conjunctivae normal.  Cardiovascular:     Rate and Rhythm: Normal rate and regular rhythm.     Pulses: Normal pulses.     Heart sounds: Normal heart sounds. No murmur heard. Pulmonary:     Effort: Pulmonary effort is normal.     Breath sounds: Normal breath sounds. No wheezing, rhonchi or rales.  Musculoskeletal:     Right lower leg: No edema.     Left lower leg: No edema.  Skin:    General: Skin is warm and dry.  Neurological:     General: No focal deficit present.     Mental Status: She is alert.    CBC    Component Value Date/Time   WBC 8.0 01/23/2024 1658   RBC 3.60 (L) 01/23/2024 1658   HGB 11.6 (L) 01/23/2024 1658   HCT 34.4 (L) 01/23/2024 1658   PLT 299 01/23/2024 1658   MCV 95.6 01/23/2024 1658   MCV 96.0 01/02/2014 1825   MCH 32.2 01/23/2024 1658   MCHC 33.7 01/23/2024 1658   RDW 12.6 01/23/2024 1658   LYMPHSABS 1.4 01/23/2024 1658   MONOABS 0.8 01/23/2024 1658   EOSABS 0.3 01/23/2024 1658   BASOSABS 0.1 01/23/2024 1658      Latest Ref Rng & Units 01/23/2024    4:58 PM 06/15/2022    3:55 PM 04/17/2022    3:56 PM  BMP  Glucose 70 - 99 mg/dL 409  811  914   BUN 8 - 23 mg/dL 23  17  26   Creatinine 0.44 - 1.00 mg/dL 1.61  0.96  0.45   Sodium 135 - 145 mmol/L 129  127  132   Potassium 3.5 - 5.1 mmol/L 4.3  4.8  4.7   Chloride 98 - 111 mmol/L 96  94  100   CO2 22 - 32 mmol/L 23  24  22    Calcium  8.9 - 10.3 mg/dL 9.7  9.9  40.9    Chest imaging: CTA Chest 06/15/22 Lungs/Pleura: Moderate centrilobular and paraseptal emphysematous changes, upper lung predominant. No suspicious pulmonary nodules. No focal consolidation. No pleural effusion or pneumothorax.  CTA Chest 06/26/21 Mediastinum/Nodes: Unremarkable CT appearance of  the thyroid  gland. No suspicious mediastinal or hilar adenopathy. No soft tissue mediastinal mass. The thoracic esophagus is unremarkable.   Lungs/Pleura: Moderately severe centrilobular pulmonary emphysema. No suspicious pulmonary mass or nodule.  PFT:    Latest Ref Rng & Units 09/01/2017   11:55 AM  PFT Results  FVC-Pre L 1.92   FVC-Predicted Pre % 61   FVC-Post L 2.23   FVC-Predicted Post % 71   Pre FEV1/FVC % % 52   Post FEV1/FCV % % 56   FEV1-Pre L 1.00   FEV1-Predicted Pre % 42   FEV1-Post L 1.25   DLCO uncorrected ml/min/mmHg 12.39   DLCO UNC% % 49   DLCO corrected ml/min/mmHg 13.68   DLCO COR %Predicted % 54   DLVA Predicted % 68   TLC L 5.89   TLC % Predicted % 114   RV % Predicted % 175   PFT 2018: Moderately Severe Obstruction. Air trapping present. Moderate Diffusion defect.   Labs: Alpha 1 antitrypsin 07/30/17 is MM, level 162  Path:  Echo 06/21/21: LV EF 60-65%. Grade I diastolic dysfunction. RV systolic function is normal. RV size is normal.   Heart Catheterization:  Assessment & Plan:   No diagnosis found.  Discussion: Shannon Roberts is a 73 year old woman, former smoker with history of COPD who returns to pulmonary clinic for follow up.  She is to continue with Anoro Ellipta  1 puff daily and as needed combivent  inhaler use. She is safe to get her covid vaccine.   She is eligible for lung cancer screening but she is receiving CT chest scans for monitoring of an aortic aneurysm. We will follow these scans in order to perform lung cancer surveillance. No nodules noted 05/2022.  Follow up in 1 year.  Duaine German, MD [facility] Office: [phone]   Current Outpatient Medications:    albuterol  (PROVENTIL ) (2.5 MG/3ML) 0.083% nebulizer solution, Take 3 mLs (2.5 mg total) by nebulization every 6 (six) hours as needed for wheezing or shortness of breath., Disp: 75 mL, Rfl: 5   amLODipine  (NORVASC ) 5 MG tablet, Take 5 mg by  mouth daily., Disp: , Rfl:    atenolol  (TENORMIN ) 50 MG tablet, Take 50 mg by mouth daily., Disp: , Rfl:    Cholecalciferol (VITAMIN D3) 5000 units CAPS, Take 5,000 Units by mouth every evening. , Disp: , Rfl:    clopidogrel  (PLAVIX ) 75 MG tablet, Take 75 mg by mouth daily. , Disp: , Rfl:    DULoxetine  (CYMBALTA ) 30 MG capsule, Take 60 mg by mouth daily at 12 noon. Patient take 2 tablets daily., Disp: , Rfl:    Evolocumab  (REPATHA  SURECLICK) 140 MG/ML SOAJ, INJECT 140 MG INTO THE SKIN EVERY 14 DAYS., Disp: 6 mL, Rfl: 3   ferrous sulfate 325 (65 FE) MG tablet, Take 325  mg by mouth daily with breakfast., Disp: , Rfl:    fluticasone  (FLONASE ) 50 MCG/ACT nasal spray, Place 1 spray into both nostrils daily., Disp: , Rfl:    Ipratropium-Albuterol  (COMBIVENT  RESPIMAT) 20-100 MCG/ACT AERS respimat, Inhale 1 puff into the lungs every 4 (four) hours as needed for wheezing or shortness of breath., Disp: 4 g, Rfl: 11   isosorbide  mononitrate (IMDUR ) 30 MG 24 hr tablet, Take 30 mg by mouth in the morning and at bedtime., Disp: , Rfl: 0   lisinopril  (PRINIVIL ,ZESTRIL ) 40 MG tablet, Take 40 mg by mouth daily., Disp: , Rfl:    LORazepam  (ATIVAN ) 0.5 MG tablet, Take 0.5 mg by mouth See admin instructions. Take 0.5 mg by mouth at bedtime and an additional 0.5 mg once a day as needed for anxiety, Disp: , Rfl: 0   pantoprazole  (PROTONIX ) 40 MG tablet, Take 40 mg by mouth in the morning and at bedtime., Disp: , Rfl:    simvastatin  (ZOCOR ) 20 MG tablet, Take 40 mg by mouth daily at 6 PM., Disp: , Rfl:    umeclidinium-vilanterol (ANORO ELLIPTA ) 62.5-25 MCG/ACT AEPB, INHALE 1 PUFF INTO THE LUNGS DAILY., Disp: 60 each, Rfl: 0

## 2024-02-13 ENCOUNTER — Encounter: Payer: Self-pay | Admitting: Pulmonary Disease

## 2024-02-13 ENCOUNTER — Other Ambulatory Visit: Payer: Self-pay | Admitting: Pulmonary Disease

## 2024-02-13 DIAGNOSIS — J432 Centrilobular emphysema: Secondary | ICD-10-CM

## 2024-02-20 ENCOUNTER — Encounter (HOSPITAL_COMMUNITY): Payer: Self-pay

## 2024-02-20 ENCOUNTER — Encounter (HOSPITAL_COMMUNITY): Payer: Self-pay | Admitting: Cardiovascular Disease

## 2024-02-20 ENCOUNTER — Ambulatory Visit (HOSPITAL_COMMUNITY)

## 2024-02-20 ENCOUNTER — Ambulatory Visit (HOSPITAL_COMMUNITY): Attending: Cardiovascular Disease

## 2024-02-24 ENCOUNTER — Telehealth: Payer: Self-pay | Admitting: Pulmonary Disease

## 2024-02-24 NOTE — Telephone Encounter (Signed)
 PT calling about the status of her neb machine. Notes on order indicate [employer] waiting for signed order. No CRM rec'd for signature. Please call [employer] and ask them to fax it to  [phone], [facility], since out fax is down.

## 2024-02-27 NOTE — Telephone Encounter (Signed)
 Please call about Neb machine. Order states the following: [employer]- waiting on signature   PT calling asking for Tanya Fantasia to call her back.

## 2024-03-02 NOTE — Telephone Encounter (Signed)
 Shannon Roberts had [employer] fax another order to sign. I had Dr. Diania Fortes sign and we emailed it to [email or web address]

## 2024-03-03 DIAGNOSIS — J449 Chronic obstructive pulmonary disease, unspecified: Secondary | ICD-10-CM | POA: Diagnosis not present

## 2024-03-03 NOTE — Telephone Encounter (Signed)
 We rec'd confirmation Shannon Roberts got this signed order.

## 2024-03-04 ENCOUNTER — Other Ambulatory Visit: Payer: Self-pay | Admitting: Cardiovascular Disease

## 2024-03-04 DIAGNOSIS — E782 Mixed hyperlipidemia: Secondary | ICD-10-CM

## 2024-03-04 DIAGNOSIS — I2510 Atherosclerotic heart disease of native coronary artery without angina pectoris: Secondary | ICD-10-CM

## 2024-03-04 DIAGNOSIS — I779 Disorder of arteries and arterioles, unspecified: Secondary | ICD-10-CM

## 2024-03-08 DIAGNOSIS — M79601 Pain in right arm: Secondary | ICD-10-CM | POA: Diagnosis not present

## 2024-03-15 ENCOUNTER — Encounter: Payer: Self-pay | Admitting: Pulmonary Disease

## 2024-03-16 NOTE — Telephone Encounter (Signed)
**Note De-identified  [place] Obfuscation** Please advise 

## 2024-03-18 ENCOUNTER — Ambulatory Visit: Payer: Self-pay | Admitting: Pulmonary Disease

## 2024-03-18 DIAGNOSIS — J441 Chronic obstructive pulmonary disease with (acute) exacerbation: Secondary | ICD-10-CM

## 2024-03-18 NOTE — Telephone Encounter (Signed)
 Copied from CRM [id number]. Topic: Clinical - Red Word Triage >> Mar 18, 2024  8:16 AM Shannon Roberts wrote: [name] that prompted transfer to Nurse Triage: copd flare up they told her to call if something changes not feeling well dont know what to do .  Dr [facility]  Dr. Diania Fortes: I was in your office on April 24th and your instructions were to return in 6 months or call earlier if anything changed. I am having another flare up (I believe) which started several days ago and has gotten progressively worse. As of today I can barely walk from room to room without getting short of breath and having to stop.Shannon Roberts also noticed my heart rate goes way up and when I cough up mucus its either light yellow or light green.   Please advise me as what to do. Needless to say this so so scary.   Thank you,   Message from mychart  TRIAGE SUMMARY NOTE: Pt reporting that she has been having a COPD exacerbation for the past week or so, more SOB than usual just with moving around, pt confirms "resting is fine," intermittent wheezing, using her albuterol  nebulizer 1x/day and combivent  rescue inhaler 1-2x/day, pulse ox 97% currently. Pt reporting her heart rate goes up "so have to stand there a minute to get going" but not above 100 bpm, with pulse ox going down to 94-95% with exertion, denies chest pain, dizziness, weakness, but "just so tired." Pt confirms that her sputum has not gotten darker than what she believes to be her baseline of light yellow/light green, though pt would like clarification on if it's okay to have that baseline. Advised pt be examined in next 4 hours, pt not willing to be seen in [place] office with appt openings today, advised that sending HP message to office for call back to pt with further recommendations. Advised pt call PCP or go to UC in meantime, also call back if any worsening. Pt verbalized understanding. Please advise.  E2C2 Pulmonary Triage - Initial Assessment  Questions "Chief Complaint (e.g., cough, sob, wheezing, fever, chills, sweat or additional symptoms) *Go to specific symptom protocol after initial questions. About the same, doesn't get any better Today is not good Dr. Diania Fortes, call me if another flare-up 5/21 was fine but noticed that when cough up phlegm usually do with inhalers usually light yellow or light green, believes to be her baseline, not gotten darker SOB worse than usual with exertion, heart rate goes up so have to stand there a minute to get going you know, doesn't go above HR 100 bpm Lying down is fine, resting is fine, just moving around No trouble lying flat No chest pain, dizziness Just so tired Not wheezing now, comes and goes, doesn't last long Have slipped disc in neck with pulled muscle in arm from cleaning floorboards, some days pain is 9/10 sometimes not, not lot of pain today, usually bothers me when standing up, breathing gets bothered when in a lot of pain too  "How long have symptoms been present?" 5/22 or so  "Have you used your inhalers/maintenance medication?" Yes If yes, "What medications?" Anora Ellipta in morning Albuterol  nebulizer 1x/day Combivent  as needed last used at 9 pm last night  If inhaler, ask "How many puffs and how often?" Note: Review instructions on medication in the chart. using 1-2x/day  OXYGEN: "Do you wear supplemental oxygen?" No  "Do you monitor your oxygen levels?" Yes If yes, "What is your reading (oxygen  level) today?" 97% this morning, when get up to walk, goes to 94-95% but that's the lowest it goes  "What is your usual oxygen saturation reading?"  (Note: Pulmonary O2 sats should be 90% or greater) Lot of times it's 98%  Reason for Disposition  [1] Longstanding difficulty breathing (e.g., CHF, COPD, emphysema) AND [2] WORSE than normal  Answer Assessment - Initial Assessment Questions 6. CARDIAC HISTORY: "Do you have any history of heart disease?" (e.g., heart attack,  angina, bypass surgery, angioplasty)      significant 7. LUNG HISTORY: "Do you have any history of lung disease?"  (e.g., pulmonary embolus, asthma, emphysema)     COPD  Protocols used: Breathing Difficulty-A-AH

## 2024-03-18 NOTE — Telephone Encounter (Signed)
**Note De-identified  [place] Obfuscation** Please advise 

## 2024-03-19 MED ORDER — PREDNISONE 10 MG PO TABS: ORAL_TABLET | ORAL | 0 refills | Status: AC

## 2024-03-19 MED ORDER — AZITHROMYCIN 250 MG PO TABS: ORAL_TABLET | ORAL | 0 refills | Status: DC

## 2024-03-19 NOTE — Addendum Note (Signed)
 Addended by: Amra Shukla on: 03/19/2024 06:17 PM   Modules accepted: Orders

## 2024-03-19 NOTE — Telephone Encounter (Signed)
 Sent in steroid taper and Zpak for COPD exacerbation. Spoke to her on the phone to let her know the instructions.  Duaine German, MD [facility] Office: [phone]   See Amion for personal pager PCCM on call pager [phone] until 7pm. Please call Elink 7p-7a. [phone]

## 2024-03-22 ENCOUNTER — Telehealth: Payer: Self-pay

## 2024-03-22 DIAGNOSIS — J441 Chronic obstructive pulmonary disease with (acute) exacerbation: Secondary | ICD-10-CM

## 2024-03-22 NOTE — Telephone Encounter (Signed)
 Copied from CRM [id number]. Topic: Clinical - Medication Question >> Mar 22, 2024  9:04 AM Crist Dominion wrote: Reason for CRM: Patient states the antibiotic azithromycin  (ZITHROMAX ) 250 MG tablet prescribed by Dr. Torrie Frei is something she can't take due to her condition and medications. Please prescribe a new antibiotic, patient states she likes doxycycline .  Dr. Diania Fortes please advise

## 2024-03-23 MED ORDER — DOXYCYCLINE HYCLATE 100 MG PO TABS: 100.0000 mg | ORAL_TABLET | Freq: Two times a day (BID) | ORAL | 0 refills | Status: DC

## 2024-03-23 NOTE — Telephone Encounter (Signed)
 Doxy sent

## 2024-03-23 NOTE — Telephone Encounter (Signed)
 Called and notified pt of JD's note. Pt verbalized understanding and had no other concerns. NFN

## 2024-03-26 DIAGNOSIS — M7521 Bicipital tendinitis, right shoulder: Secondary | ICD-10-CM | POA: Diagnosis not present

## 2024-03-30 DIAGNOSIS — E782 Mixed hyperlipidemia: Secondary | ICD-10-CM | POA: Diagnosis not present

## 2024-03-30 DIAGNOSIS — E1122 Type 2 diabetes mellitus with diabetic chronic kidney disease: Secondary | ICD-10-CM | POA: Diagnosis not present

## 2024-03-30 DIAGNOSIS — Z862 Personal history of diseases of the blood and blood-forming organs and certain disorders involving the immune mechanism: Secondary | ICD-10-CM | POA: Diagnosis not present

## 2024-03-30 DIAGNOSIS — E559 Vitamin D deficiency, unspecified: Secondary | ICD-10-CM | POA: Diagnosis not present

## 2024-03-30 DIAGNOSIS — I10 Essential (primary) hypertension: Secondary | ICD-10-CM | POA: Diagnosis not present

## 2024-03-30 DIAGNOSIS — E039 Hypothyroidism, unspecified: Secondary | ICD-10-CM | POA: Diagnosis not present

## 2024-04-01 ENCOUNTER — Ambulatory Visit (HOSPITAL_COMMUNITY)
Admission: RE | Admit: 2024-04-01 | Discharge: 2024-04-01 | Discharge status: Home / Self Care | Source: Ambulatory Visit | Attending: Cardiovascular Disease

## 2024-04-01 ENCOUNTER — Other Ambulatory Visit (HOSPITAL_COMMUNITY)

## 2024-04-01 ENCOUNTER — Ambulatory Visit (HOSPITAL_COMMUNITY)
Admission: RE | Admit: 2024-04-01 | Discharge: 2024-04-01 | Disposition: A | Discharge status: Home / Self Care | Source: Ambulatory Visit | Attending: Cardiovascular Disease | Admitting: Cardiovascular Disease

## 2024-04-01 DIAGNOSIS — I7143 Infrarenal abdominal aortic aneurysm, without rupture: Secondary | ICD-10-CM | POA: Diagnosis not present

## 2024-04-01 DIAGNOSIS — Z8673 Personal history of transient ischemic attack (TIA), and cerebral infarction without residual deficits: Secondary | ICD-10-CM | POA: Diagnosis not present

## 2024-04-01 DIAGNOSIS — I779 Disorder of arteries and arterioles, unspecified: Secondary | ICD-10-CM

## 2024-04-02 DIAGNOSIS — I10 Essential (primary) hypertension: Secondary | ICD-10-CM | POA: Diagnosis not present

## 2024-04-02 DIAGNOSIS — K219 Gastro-esophageal reflux disease without esophagitis: Secondary | ICD-10-CM | POA: Diagnosis not present

## 2024-04-02 DIAGNOSIS — E782 Mixed hyperlipidemia: Secondary | ICD-10-CM | POA: Diagnosis not present

## 2024-04-02 DIAGNOSIS — F419 Anxiety disorder, unspecified: Secondary | ICD-10-CM | POA: Diagnosis not present

## 2024-04-02 DIAGNOSIS — Z0000 Encounter for general adult medical examination without abnormal findings: Secondary | ICD-10-CM | POA: Diagnosis not present

## 2024-04-05 ENCOUNTER — Other Ambulatory Visit (HOSPITAL_COMMUNITY): Payer: Self-pay

## 2024-04-05 ENCOUNTER — Telehealth: Payer: Self-pay | Admitting: Pharmacy Technician

## 2024-04-05 NOTE — Telephone Encounter (Signed)
 Pharmacy Patient Advocate Encounter   Received notification from Fax that prior authorization for Repatha  is required/requested.   Insurance verification completed.   The patient is insured through [facility] .   Per test claim: PA required; PA submitted to above mentioned insurance via CoverMyMeds Key/confirmation #/EOC [id number] Status is pending

## 2024-04-05 NOTE — Telephone Encounter (Signed)
 Pharmacy Patient Advocate Encounter  Received notification from [facility] medicare that Prior Authorization for repatha  has been APPROVED from 04/05/24 to 04/05/25. Ran test claim, Copay is $88.81- 3 months. This test claim was processed through [facility]- copay amounts may vary at other pharmacies due to pharmacy/plan contracts, or as the patient moves through the different stages of their insurance plan.   PA #/Case ID/Reference #: [id number]

## 2024-04-08 ENCOUNTER — Ambulatory Visit: Payer: Self-pay | Admitting: Cardiovascular Disease

## 2024-04-13 ENCOUNTER — Other Ambulatory Visit: Payer: Self-pay | Admitting: Pulmonary Disease

## 2024-04-13 DIAGNOSIS — J432 Centrilobular emphysema: Secondary | ICD-10-CM

## 2024-04-19 ENCOUNTER — Ambulatory Visit (HOSPITAL_COMMUNITY): Admission: RE | Admit: 2024-04-19 | Source: Ambulatory Visit

## 2024-04-21 ENCOUNTER — Ambulatory Visit: Payer: Self-pay | Admitting: Pulmonary Disease

## 2024-04-21 NOTE — Telephone Encounter (Signed)
FYI - pt has appt tomorrow with you.

## 2024-04-21 NOTE — Telephone Encounter (Signed)
 FYI Only or Action Required?: FYI only for provider.  Patient is followed in Pulmonology for COPD, last seen on 02/12/2024 by Kara Dorn NOVAK, MD. Called Nurse Triage reporting Shortness of Breath. Symptoms began about a week and a half ago. Interventions attempted: Prescription medications: Neb and Inhalers, Rescue inhaler, Maintenance inhaler, and Nebulizer treatments. Symptoms are: gradually worsening.  Triage Disposition: See Physician Within 24 Hours (overriding See HCP Within 4 Hours (Or PCP Triage))  Patient/caregiver understands and will follow disposition?: Yes---Patient coming tomorrow 04/22/2024 at 10:30 AM to see Almarie Llanos with office staff and this was scheduled--Patient advised to go to Urgent Care or the Emergency Room if any worse---She verbalized understanding                    Copied from CRM [id number]. Topic: Clinical - Red Word Triage >> Apr 21, 2024  8:27 AM Benton O wrote: [name] that prompted transfer to Nurse Triage: breathing has changed having breathing issues and needing to speak with a nurse Reason for Disposition  [1] MILD difficulty breathing (e.g., minimal/no SOB at rest, SOB with walking, pulse <100) AND [2] NEW-onset or WORSE than normal  Answer Assessment - Initial Assessment Questions Patient saw Dr Kara in April and he suggested back then that the next step may be a steroid inhaler. Patient states that she is getting worse again with her breathing.  [facility] Triage - Initial Assessment Questions Chief Complaint (e.g., cough, sob, wheezing, fever, chills, sweat or additional symptoms) *Go to specific symptom protocol after initial questions. Shortness of breath, coughing up yellow mucous  How long have symptoms been present? A week and a half and getting worse  Have you tested for COVID or Flu? Note: If not, ask patient if a home test can be taken. If so, instruct patient to call back for positive  results. No  MEDICINES:   Have you used any OTC meds to help with symptoms? No If yes, ask What medications? N/A  Have you used your inhalers/maintenance medication? Yes If yes, What medications? Albuterol  Nebulizer--Take 3 mLs (2.5 mg total) by nebulization every 6 (six) hours as needed for wheezing or shortness of breath. Anoro Ellipta --INHALE 1 PUFF INTO THE LUNGS DAILY Combivent  Respimat-- Inhale 1 puff into the lungs every 4 (four) hours as needed for wheezing or shortness of breath.  If inhaler, ask How many puffs and how often? Note: Review instructions on medication in the chart. Albuterol  Nebulizer--Take 3 mLs (2.5 mg total) by nebulization every 6 (six) hours as needed for wheezing or shortness of breath. Anoro Ellipta --INHALE 1 PUFF INTO THE LUNGS DAILY Combivent  Respimat-- Inhale 1 puff into the lungs every 4 (four) hours as needed for wheezing or shortness of breath.  OXYGEN: Do you wear supplemental oxygen? No If yes, How many liters are you supposed to use? N/A  Do you monitor your oxygen levels? Yes If yes, What is your reading (oxygen level) today? 96% this morning and heart rate around 74  What is your usual oxygen saturation reading?  (Note: Pulmonary O2 sats should be 90% or greater) Usually 97-98%   never under 95% per patient      3. PATTERN Does the difficult breathing come and go, or has it been constant since it started?      Getting worse lately  coughing up yellow again 4. SEVERITY: How bad is your breathing? (e.g., mild, moderate, severe)    - MILD: No SOB at rest, mild SOB with  walking, speaks normally in sentences, can lie down, no retractions, pulse < 100.    - MODERATE: SOB at rest, SOB with minimal exertion and prefers to sit, cannot lie down flat, speaks in phrases, mild retractions, audible wheezing, pulse 100-120.    - SEVERE: Very SOB at rest, speaks in single words, struggling to breathe, sitting hunched forward,  retractions, pulse > 120      Just with exertion 5. RECURRENT SYMPTOM: Have you had difficulty breathing before? If Yes, ask: When was the last time? and What happened that time?      Yes 6. CARDIAC HISTORY: Do you have any history of heart disease? (e.g., heart attack, angina, bypass surgery, angioplasty)      Cardiology history 7. LUNG HISTORY: Do you have any history of lung disease?  (e.g., pulmonary embolus, asthma, emphysema)     COPD 8. CAUSE: What do you think is causing the breathing problem?      COPD 9. OTHER SYMPTOMS: Do you have any other symptoms? (e.g., dizziness, runny nose, cough, chest pain, fever)     Coughing up yellow phlegm  Protocols used: Breathing Difficulty-A-AH

## 2024-04-22 ENCOUNTER — Ambulatory Visit: Admitting: Primary Care

## 2024-04-22 ENCOUNTER — Encounter: Payer: Self-pay | Admitting: Primary Care

## 2024-04-22 VITALS — BP 126/66 | HR 72 | Temp 97.5°F | Ht 65.0 in | Wt 190.8 lb

## 2024-04-22 DIAGNOSIS — J441 Chronic obstructive pulmonary disease with (acute) exacerbation: Secondary | ICD-10-CM | POA: Diagnosis not present

## 2024-04-22 DIAGNOSIS — Z87891 Personal history of nicotine dependence: Secondary | ICD-10-CM | POA: Diagnosis not present

## 2024-04-22 DIAGNOSIS — J432 Centrilobular emphysema: Secondary | ICD-10-CM | POA: Diagnosis not present

## 2024-04-22 MED ORDER — TRELEGY ELLIPTA 200-62.5-25 MCG/ACT IN AEPB: 1.0000 | INHALATION_SPRAY | Freq: Every day | RESPIRATORY_TRACT | Status: DC

## 2024-04-22 MED ORDER — TRELEGY ELLIPTA 200-62.5-25 MCG/ACT IN AEPB: 1.0000 | INHALATION_SPRAY | Freq: Every day | RESPIRATORY_TRACT | 5 refills | Status: DC

## 2024-04-22 MED ORDER — PREDNISONE 10 MG PO TABS: ORAL_TABLET | ORAL | 0 refills | Status: DC

## 2024-04-22 NOTE — Progress Notes (Signed)
 @Patient  ID: Shannon Roberts, female    DOB: 05-26-1951, 73 y.o.   MRN: [id number]  Chief Complaint  Patient presents with   Shortness of Breath    Pt is still having a wet cough with yellow mucous, and the cough makes her feel winded and more s.o.b. X1.5 weeks. Pt states this is progressively worse. Mild chest tightness.     Referring provider: Okey Carlin Redbird, MD  HPI: 73 year old female, former smoker quit in 2015. PMH significant for HTN, carotid stenosis, stroke, RBBB, COPD, chronic allergic rhinitis, GERD, IBS, ovarian cyst, CKD stage III, hyperlipidemia, endometriosis, pre-diabetes.   Previous [facility] pulmonary encounter 02/12/24 Shannon Roberts is a 73 year old woman, former smoker with history of COPD who returns to pulmonary clinic for follow up.   She was treated for COPD exacerbation earlier this month in the ER. She reports an upper respiratory viral infection that she contracted from family. She is feeling better after course of steroids and antibiotics. She has not had trouble with her breathing for about 2 years until that episode. She remains on anoro ellipta  daily.   04/22/2024- Interim hx  Discussed the use of AI scribe software for clinical note transcription with the patient, who gave verbal consent to proceed.  History of Present Illness   Shannon Roberts is a 73 year old female with COPD emphysema who presents with increased frequency of respiratory flare-ups.  Since February, she has experienced an increased frequency of respiratory flare-ups, initially treated with antibiotics and prednisone . Although there was initial improvement, her symptoms have recurred more frequently than her previous pattern of once every one to two years.  In April, she was prescribed antibiotics and prednisone  for her shortness of breath, which includes coughing up yellow sputum, wheezing, and shortness of breath. In June, she was last prescribed doxycycline , which provided some relief,  but her symptoms have persisted with fluctuating sputum color from clear to light yellow.  Her past medical history includes COPD emphysema, and she is currently using Anoro as prescribed. She has a history of smoking, having quit ten years ago. She also uses Flonase  as needed for sinus congestion but does not take any antihistamines. An allergy  panel in 2018 returned negative results, and genetic testing for alpha-1 antitrypsin deficiency was normal.  Her symptoms are exacerbated by activity and she experiences relief when coughing up sputum, although it is sometimes thick. She has tried Mucinex  in the past but is unsure of its effectiveness. No significant sinus congestion or green sputum.  Breathing tests from 2018 indicated moderate obstruction with a significant bronchodilator response.      Chest imaging: CXR 01/23/24 The heart size and mediastinal contours are within normal limits. Both lungs are clear. The visualized skeletal structures are unremarkable.   CTA Chest 06/15/22 Lungs/Pleura: Moderate centrilobular and paraseptal emphysematous changes, upper lung predominant. No suspicious pulmonary nodules. No focal consolidation. No pleural effusion or pneumothorax.   CTA Chest 06/26/21 Mediastinum/Nodes: Unremarkable CT appearance of the thyroid  gland. No suspicious mediastinal or hilar adenopathy. No soft tissue mediastinal mass. The thoracic esophagus is unremarkable.   Lungs/Pleura: Moderately severe centrilobular pulmonary emphysema. No suspicious pulmonary mass or nodule.  Allergies  Allergen Reactions   Carvedilol Palpitations    Other reaction(s): palpitations   Buspirone Other (See Comments)    Reaction not recalled Other reaction(s): did not work   Dapsone Other (See Comments)    Flushing  Other reaction(s): flushed   Niacin And Related Other (  See Comments)    Flushing    Paroxetine Hcl     Other reaction(s): dizziness, dreams   Septra  [Sulfamethoxazole-Trimethoprim] Diarrhea and Nausea And Vomiting   Sertraline Hcl     Other reaction(s): skin crawling fuzzy head   Amoxicillin Rash and Other (See Comments)    Has patient had a PCN reaction causing immediate rash, facial/tongue/throat swelling, SOB or lightheadedness with hypotension: No Has patient had a PCN reaction causing severe rash involving mucus membranes or skin necrosis: No Has patient had a PCN reaction that required hospitalization No Has patient had a PCN reaction occurring within the last 10 years: No If all of the above answers are NO, then may proceed with Cephalosporin use. Other reaction(s): FLUSHED FEELING   Azithromycin  Other (See Comments)    Pt states she can not take this due to her heart medication and heart condition.    Wellbutrin [Bupropion] Anxiety and Other (See Comments)    Caused the patient to feel jittery    Immunization History  Administered Date(s) Administered   Fluzone Influenza virus vaccine,trivalent (IIV3), split virus 08/02/2010, 08/01/2014   Influenza Split 07/30/2017   Influenza, High Dose Seasonal PF 07/22/2016, 07/30/2017, 07/31/2018, 07/24/2019, 08/14/2020   Influenza,inj,Quad PF,6+ Mos 06/30/2013   Influenza,inj,quad, With Preservative 07/28/2015   PFIZER(Purple Top)SARS-COV-2 Vaccination 11/27/2019, 12/18/2019, 07/25/2020   Pneumococcal Conjugate-13 01/31/2016   Pneumococcal Polysaccharide-23 12/28/1999, 10/03/2010, 01/18/2014   Td 03/03/1997    Past Medical History:  Diagnosis Date   Allergic rhinitis    Anemia    per hosp. - recently- pt. told that she is anemic   Anxiety    Colitis    COPD (chronic obstructive pulmonary disease) (HCC)    no o2   Depression    Diverticulosis    Female bladder prolapse    Fibroids    GERD (gastroesophageal reflux disease)    Heart murmur    Hx of adenomatous colonic polyps 08/26/2019   Hyperlipemia    Hypertension    IBS (irritable bowel syndrome)    Insomnia    Lip  abscess 04/05/2016   Mitral valve prolapse    Peripheral vascular disease (HCC)    Pre-diabetes 08/03/2019   Diet controlled     Right bundle branch block    Sciatic nerve disease    Shortness of breath    Stroke [facility]) March 2015    Tobacco History: Social History   Tobacco Use  Smoking Status Former   Current packs/day: 0.00   Average packs/day: 1.5 packs/day for 47.1 years (70.6 ttl pk-yrs)   Types: Cigarettes   Start date: 12/03/1966   Quit date: 01/02/2014   Years since quitting: 10.3  Smokeless Tobacco Never  Tobacco Comments   Stopped for 6 months once in 2007   Counseling given: Not Answered Tobacco comments: Stopped for 6 months once in 2007   Outpatient Medications Prior to Visit  Medication Sig Dispense Refill   albuterol  (PROVENTIL ) (2.5 MG/3ML) 0.083% nebulizer solution Take 3 mLs (2.5 mg total) by nebulization every 6 (six) hours as needed for wheezing or shortness of breath. 75 mL 5   amLODipine  (NORVASC ) 5 MG tablet Take 5 mg by mouth daily.     atenolol  (TENORMIN ) 50 MG tablet Take 50 mg by mouth daily.     Cholecalciferol (VITAMIN D3) 5000 units CAPS Take 5,000 Units by mouth every evening.      clopidogrel  (PLAVIX ) 75 MG tablet Take 75 mg by mouth daily.      DULoxetine  (  CYMBALTA ) 30 MG capsule Take 60 mg by mouth daily at 12 noon. Patient take 2 tablets daily.     Evolocumab  (REPATHA  SURECLICK) 140 MG/ML SOAJ ADMINISTER 1 ML UNDER THE SKIN EVERY 14 DAYS 6 mL 0   ferrous sulfate 325 (65 FE) MG tablet Take 325 mg by mouth daily with breakfast.     fluticasone  (FLONASE ) 50 MCG/ACT nasal spray Place 1 spray into both nostrils daily.     Ipratropium-Albuterol  (COMBIVENT  RESPIMAT) 20-100 MCG/ACT AERS respimat Inhale 1 puff into the lungs every 4 (four) hours as needed for wheezing or shortness of breath. 4 g 11   isosorbide  mononitrate (IMDUR ) 30 MG 24 hr tablet Take 30 mg by mouth in the morning and at bedtime.  0   lisinopril  (PRINIVIL ,ZESTRIL ) 40 MG tablet Take  40 mg by mouth daily.     LORazepam  (ATIVAN ) 0.5 MG tablet Take 0.5 mg by mouth See admin instructions. Take 0.5 mg by mouth at bedtime and an additional 0.5 mg once a day as needed for anxiety  0   pantoprazole  (PROTONIX ) 40 MG tablet Take 40 mg by mouth in the morning and at bedtime.     simvastatin  (ZOCOR ) 20 MG tablet Take 40 mg by mouth daily at 6 PM.     umeclidinium-vilanterol (ANORO ELLIPTA ) 62.5-25 MCG/ACT AEPB INHALE 1 PUFF INTO THE LUNGS DAILY 60 each 5   azithromycin  (ZITHROMAX ) 250 MG tablet Take as directed (Patient not taking: Reported on 04/22/2024) 6 tablet 0   doxycycline  (VIBRA -TABS) 100 MG tablet Take 1 tablet (100 mg total) by mouth 2 (two) times daily. (Patient not taking: Reported on 04/22/2024) 14 tablet 0   No facility-administered medications prior to visit.      Review of Systems  Review of Systems  Constitutional: Negative.   HENT: Negative.    Respiratory:  Positive for cough, shortness of breath and wheezing.   Cardiovascular: Negative.    Physical Exam  BP 126/66 (BP Location: Right Arm, Patient Position: Sitting, Cuff Size: Normal)   Pulse 72   Temp (!) 97.5 F (36.4 C) (Temporal)   Ht 5' 5 (1.651 m)   Wt 190 lb 12.8 oz (86.5 kg)   SpO2 98%   BMI 31.75 kg/m  Physical Exam Constitutional:      General: She is not in acute distress.    Appearance: She is well-developed. She is not ill-appearing.  HENT:     Head: Normocephalic and atraumatic.  Pulmonary:     Effort: Pulmonary effort is normal. No respiratory distress.     Breath sounds: Examination of the right-upper field reveals rales. Examination of the left-upper field reveals rales. Rales present.  Musculoskeletal:        General: Normal range of motion.  Skin:    General: Skin is warm and dry.  Neurological:     General: No focal deficit present.     Mental Status: She is alert and oriented to person, place, and time.      Lab Results:  CBC    Component Value Date/Time   WBC 8.0  01/23/2024 1658   RBC 3.60 (L) 01/23/2024 1658   HGB 11.6 (L) 01/23/2024 1658   HCT 34.4 (L) 01/23/2024 1658   PLT 299 01/23/2024 1658   MCV 95.6 01/23/2024 1658   MCV 96.0 01/02/2014 1825   MCH 32.2 01/23/2024 1658   MCHC 33.7 01/23/2024 1658   RDW 12.6 01/23/2024 1658   LYMPHSABS 1.4 01/23/2024 1658   MONOABS 0.8 01/23/2024 1658  EOSABS 0.3 01/23/2024 1658   BASOSABS 0.1 01/23/2024 1658    BMET    Component Value Date/Time   NA 129 (L) 01/23/2024 1658   K 4.3 01/23/2024 1658   CL 96 (L) 01/23/2024 1658   CO2 23 01/23/2024 1658   GLUCOSE 181 (H) 01/23/2024 1658   BUN 23 01/23/2024 1658   CREATININE 1.37 (H) 01/23/2024 1658   CREATININE 0.98 01/02/2014 1808   CALCIUM  9.7 01/23/2024 1658   GFRNONAA 41 (L) 01/23/2024 1658   GFRAA 46 (L) 01/30/2017 2008    BNP No results found for: BNP  ProBNP No results found for: PROBNP  Imaging: VAS US  AAA DUPLEX Result Date: 04/04/2024 ABDOMINAL AORTA STUDY Patient Name:  Shannon Roberts  Date of Exam:   04/01/2024 Medical Rec #: [id number]         Accession #:    [id number] Date of Birth: 11/12/1950         Patient Gender: F Patient Age:   40 years Exam Location:  [address] Procedure:      VAS US  AAA DUPLEX Referring Phys: DARRYLE O'NEAL --------------------------------------------------------------------------------  Indications: Follow up exam for known AAA. Risk Factors: Hypertension, hyperlipidemia, prior CVA. Limitations: Air/bowel gas, obesity and patient discomfort.  Comparison Study: 12/03/22 Performing Technologist: Duwaine Hives RVS  Examination Guidelines: A complete evaluation includes B-mode imaging, spectral Doppler, color Doppler, and power Doppler as needed of all accessible portions of each vessel. Bilateral testing is considered an integral part of a complete examination. Limited examinations for reoccurring indications may be performed as noted.  Abdominal Aorta Findings:  +-----------+-------+----------+----------+--------+--------+--------+ Location   AP (cm)Trans (cm)PSV (cm/s)WaveformThrombusComments +-----------+-------+----------+----------+--------+--------+--------+ Proximal   1.93   1.92      47                                 +-----------+-------+----------+----------+--------+--------+--------+ Mid        2.76   2.80      36                        ectatic  +-----------+-------+----------+----------+--------+--------+--------+ Distal     3.08   3.03      25                        fusiform +-----------+-------+----------+----------+--------+--------+--------+ RT CIA Prox0.9    0.9       148       biphasic                 +-----------+-------+----------+----------+--------+--------+--------+ LT CIA Prox0.9    0.9       108       biphasic                 +-----------+-------+----------+----------+--------+--------+--------+  Summary: Abdominal Aorta: There is evidence of abnormal dilatation of the distal Abdominal aorta. The largest aortic measurement is 3.1 cm. The largest aortic diameter remains essentially unchanged compared to prior exam. Previous diameter measurement was 3.1 cm obtained on 12/31/22. Stenosis: +------------------+-------------+ Location          Stenosis      +------------------+-------------+ Right Common Iliac<50% stenosis +------------------+-------------+ Left Common Iliac <50% stenosis +------------------+-------------+   *See table(s) above for measurements and observations. Suggest follow up study in 3 years. No significant change since 12/31/22.  Electronically signed by Gordy Bergamo MD on 04/04/2024 at 5:19:42 PM.    Final  VAS US  CAROTID Result Date: 04/04/2024 Carotid Arterial Duplex Study Patient Name:  Shannon Roberts  Date of Exam:   04/01/2024 Medical Rec #: [id number]         Accession #:    [id number] Date of Birth: 1950-12-05         Patient Gender: F Patient Age:   56 years Exam  Location:  [address] Procedure:      VAS US  CAROTID Referring Phys: DARRYLE O'NEAL --------------------------------------------------------------------------------  Indications:       Patient denies any cerebrovascular symptoms. Risk Factors:      Hypertension, hyperlipidemia, prior CVA. Other Factors:     Right carotid endarterectomy on 01/17/2014. COPD. Comparison Study:  12/03/22 Performing Technologist: Duwaine Hives RVS  Examination Guidelines: A complete evaluation includes B-mode imaging, spectral Doppler, color Doppler, and power Doppler as needed of all accessible portions of each vessel. Bilateral testing is considered an integral part of a complete examination. Limited examinations for reoccurring indications may be performed as noted.  Right Carotid Findings: +----------+--------+--------+--------+----------------------+--------+           PSV cm/sEDV cm/sStenosisPlaque Description    Comments +----------+--------+--------+--------+----------------------+--------+ CCA Prox  69      13                                             +----------+--------+--------+--------+----------------------+--------+ CCA Distal67      14                                             +----------+--------+--------+--------+----------------------+--------+ ICA Prox  99      19      1-39%   heterogenous and focals/p CEA  +----------+--------+--------+--------+----------------------+--------+ ICA Mid   60      13                                    tortuous +----------+--------+--------+--------+----------------------+--------+ ICA Distal51      16                                    tortuous +----------+--------+--------+--------+----------------------+--------+ ECA       67      11                                             +----------+--------+--------+--------+----------------------+--------+ +----------+--------+-------+--------+-------------------+           PSV  cm/sEDV cmsDescribeArm Pressure (mmHG) +----------+--------+-------+--------+-------------------+ Subclavian90      0              157                 +----------+--------+-------+--------+-------------------+ +---------+--------+--+--------+-+---------+ VertebralPSV cm/s40EDV cm/s8Antegrade +---------+--------+--+--------+-+---------+  Left Carotid Findings: +----------+--------+--------+--------+------------------+--------+           PSV cm/sEDV cm/sStenosisPlaque DescriptionComments +----------+--------+--------+--------+------------------+--------+ CCA Prox  79      19                                         +----------+--------+--------+--------+------------------+--------+  CCA Distal81      24                                         +----------+--------+--------+--------+------------------+--------+ ICA Prox  111     21                                         +----------+--------+--------+--------+------------------+--------+ ICA Mid   65      23                                         +----------+--------+--------+--------+------------------+--------+ ICA Distal64      20                                         +----------+--------+--------+--------+------------------+--------+ ECA       189     29                                         +----------+--------+--------+--------+------------------+--------+ +----------+--------+--------+--------+-------------------+           PSV cm/sEDV cm/sDescribeArm Pressure (mmHG) +----------+--------+--------+--------+-------------------+ Subclavian129     0               150                 +----------+--------+--------+--------+-------------------+ +---------+--------+--+--------+-+---------+ VertebralPSV cm/s25EDV cm/s9Antegrade +---------+--------+--+--------+-+---------+   Summary: Right Carotid: Velocities in the right ICA are consistent with a 1-39% stenosis. Left Carotid:  Velocities in the left ICA are consistent with a 1-39% stenosis.               The ECA appears <50% stenosed. Vertebrals: Bilateral vertebral arteries demonstrate antegrade flow. *See table(s) above for measurements and observations. Suggest follow up study in If clinically indicated. Right CEA appears patent (01/17/14). Electronically signed by Gordy Bergamo MD on 04/04/2024 at 5:14:58 PM.    Final      Assessment & Plan:   1. COPD with acute exacerbation (HCC) (Primary)  2. Centrilobular emphysema (HCC)   Assessment and Plan    COPD with emphysema COPD with emphysema, experiencing increased frequency of flare-ups. Previous treatment with Anoro. Recent exacerbations treated with antibiotics and prednisone . Significant bronchodilator response in past pulmonary function tests along with eosinophilia on lab work indicates an asthmatic component. Current symptoms include light yellow sputum and wheezing more pronounced on the right side.  - Discontinue Anoro after current supply is finished Production manager 200mcg with provided sample - Prescribe prednisone  for acute management of wheezing - Recommend use of Mucinex  or Robitussin as expectorants - Consider adding either Nucala or Dupixent if continues to have flare ups   Allergic rhinitis Allergic rhinitis with sinus congestion. Previous allergy  panel in 2018 was negative. Eosinophil elevation suggests possible allergic component. - Continue Flonase  as needed  Shannon LELON Ferrari, NP 04/22/2024

## 2024-04-22 NOTE — Patient Instructions (Addendum)
-  COPD WITH EMPHYSEMA: COPD with emphysema is a chronic lung condition that causes breathing difficulties and frequent respiratory flare-ups. Given your recent increase in flare-ups, we will discontinue your current medication, Anoro. You will start using Trelegy, which may help manage your symptoms better. For acute wheezing, you will take prednisone  as prescribed. Additionally, you can use Mucinex  or Robitussin to help clear mucus from your lungs.  -ALLERGIC RHINITIS: Allergic rhinitis is an allergic reaction that causes sneezing, congestion, and a runny nose. Although your allergy  panel was negative in 2018, your elevated eosinophil levels suggest a possible allergic component. Continue using Flonase  as needed to manage your sinus congestion.  INSTRUCTIONS: Please follow up with us  if your symptoms do not improve or if they worsen. Continue using your medications as prescribed and monitor your symptoms closely. If you have any questions or concerns, do not hesitate to contact our office.  Follow-up October with Dr. Kara / if continue to have flares ups we can discuss adding biologic such as Nucala

## 2024-05-31 ENCOUNTER — Encounter: Payer: Self-pay | Admitting: Cardiovascular Disease

## 2024-06-01 ENCOUNTER — Ambulatory Visit (HOSPITAL_COMMUNITY)
Admission: RE | Admit: 2024-06-01 | Discharge: 2024-06-01 | Disposition: A | Discharge status: Home / Self Care | Source: Ambulatory Visit | Attending: Cardiology | Admitting: Cardiology

## 2024-06-01 DIAGNOSIS — I2510 Atherosclerotic heart disease of native coronary artery without angina pectoris: Secondary | ICD-10-CM | POA: Diagnosis not present

## 2024-06-01 DIAGNOSIS — I4510 Unspecified right bundle-branch block: Secondary | ICD-10-CM | POA: Diagnosis not present

## 2024-06-01 LAB — ECHOCARDIOGRAM COMPLETE
Area-P 1/2: 4.74 cm2
Est EF: 55
S' Lateral: 2.7 cm

## 2024-06-02 ENCOUNTER — Encounter: Payer: Self-pay | Admitting: Cardiovascular Disease

## 2024-06-07 ENCOUNTER — Other Ambulatory Visit: Payer: Self-pay | Admitting: Cardiovascular Disease

## 2024-06-07 DIAGNOSIS — I2510 Atherosclerotic heart disease of native coronary artery without angina pectoris: Secondary | ICD-10-CM

## 2024-06-07 DIAGNOSIS — E782 Mixed hyperlipidemia: Secondary | ICD-10-CM

## 2024-06-07 DIAGNOSIS — I779 Disorder of arteries and arterioles, unspecified: Secondary | ICD-10-CM

## 2024-06-08 ENCOUNTER — Telehealth: Payer: Self-pay | Admitting: Cardiovascular Disease

## 2024-06-08 DIAGNOSIS — I2510 Atherosclerotic heart disease of native coronary artery without angina pectoris: Secondary | ICD-10-CM

## 2024-06-08 DIAGNOSIS — I779 Disorder of arteries and arterioles, unspecified: Secondary | ICD-10-CM

## 2024-06-08 DIAGNOSIS — E782 Mixed hyperlipidemia: Secondary | ICD-10-CM

## 2024-06-08 MED ORDER — REPATHA SURECLICK 140 MG/ML ~~LOC~~ SOAJ: 140.0000 mg | SUBCUTANEOUS | 3 refills | Status: AC

## 2024-06-08 NOTE — Telephone Encounter (Signed)
 Spoke with pt to let her know that prescription refill has been sent in to [facility]. Pt verbalizes understanding.

## 2024-06-08 NOTE — Telephone Encounter (Signed)
*  STAT* If patient is at the pharmacy, call can be transferred to refill team.   1. Which medications need to be refilled? (please list name of each medication and dose if known) Evolocumab  (REPATHA  SURECLICK) 140 MG/ML SOAJ   2. Which pharmacy/location (including street and city if local pharmacy) is medication to be sent to? [id number] - [place], [place] - [address]   3. Do they need a 30 day or 90 day supply? 90

## 2024-06-30 ENCOUNTER — Encounter: Payer: Self-pay | Admitting: Pulmonary Disease

## 2024-07-01 ENCOUNTER — Other Ambulatory Visit (HOSPITAL_BASED_OUTPATIENT_CLINIC_OR_DEPARTMENT_OTHER): Payer: Self-pay

## 2024-07-01 DIAGNOSIS — J449 Chronic obstructive pulmonary disease, unspecified: Secondary | ICD-10-CM

## 2024-07-01 MED ORDER — ALBUTEROL SULFATE (2.5 MG/3ML) 0.083% IN NEBU: 2.5000 mg | INHALATION_SOLUTION | Freq: Four times a day (QID) | RESPIRATORY_TRACT | 5 refills | Status: AC | PRN

## 2024-07-30 ENCOUNTER — Encounter: Payer: Self-pay | Admitting: Cardiovascular Disease

## 2024-08-01 ENCOUNTER — Encounter: Payer: Self-pay | Admitting: Cardiovascular Disease

## 2024-08-02 NOTE — Telephone Encounter (Signed)
 Patient MyCharted several time about the same question. Sent same reply that Dr. Barbaraann send in a previous message.

## 2024-08-11 ENCOUNTER — Ambulatory Visit (INDEPENDENT_AMBULATORY_CARE_PROVIDER_SITE_OTHER): Admitting: Pulmonary Disease

## 2024-08-11 ENCOUNTER — Encounter: Payer: Self-pay | Admitting: Pulmonary Disease

## 2024-08-11 VITALS — BP 126/83 | HR 73 | Ht 65.0 in | Wt 194.0 lb

## 2024-08-11 DIAGNOSIS — Z87891 Personal history of nicotine dependence: Secondary | ICD-10-CM | POA: Diagnosis not present

## 2024-08-11 DIAGNOSIS — J4489 Other specified chronic obstructive pulmonary disease: Secondary | ICD-10-CM

## 2024-08-11 NOTE — Patient Instructions (Addendum)
 Continue trelegy 1 puff daily - rinse mouth out after each use  Use albuterol  nebulizer or combivent  inhaler as needed every 4-6 hours as needed  Follow up in 6 months

## 2024-08-11 NOTE — Progress Notes (Signed)
 Synopsis: Referred in October 2022 for COPD. Former Dr. Gretta patient.  Subjective:   PATIENT ID: Shannon Roberts GENDER: female DOB: 05/07/1951, MRN: [id number]  HPI  Chief Complaint  Patient presents with   Medical Management of Chronic Issues    Pt states new INH works well for her    Shannon Roberts is a 73 year old woman, former smoker with history of COPD who returns to pulmonary clinic for follow up.  She was started on trelegy inhaler after visit with Landry Ferrari, NP on 04/22/24 for flare in her breathing. She reports this has been great for her breathing. She has not needed her albuterol  much since starting trelegy.  OV 02/12/24 She was treated for COPD exacerbation earlier this month in the ER. She reports an upper respiratory viral infection that she contracted from family. She is feeling better after course of steroids and antibiotics. She has not had trouble with her breathing for about 2 years until that episode. She remains on anoro ellipta  daily.   OV 12/04/22 She has been doing well on Anoro ellipta  1 puff daily and as needed combivent . She is using combivent  seldomly.   CT Angio Chest from 06/15/22 with no new nodules.   ONO from 2022 showed no SpO2 desturations.  OV 08/07/21 She is currently using Anoro ellipta  1 puff daily. She is using albuterol  nebulizer treatments as needed when home or using combivent  inhaler as needed when away from home. She is using combivent  at least once a day after doing exertional activity. She denies any night time awakenings. She has cough this time of year due to sinus congestion and drainage. She does use flonase  as needed. She reports having about 2 exacerbations on average per year.  She is receiving monitoring CT scans for an aneurysm which she does not have any concerning nodules or lesions noted from the most recent scan on 06/26/21.   She has a 70 pack year smoking history and quit in 2015.   Past Medical History:  Diagnosis Date    Allergic rhinitis    Anemia    per hosp. - recently- pt. told that she is anemic   Anxiety    Colitis    COPD (chronic obstructive pulmonary disease) (HCC)    no o2   Depression    Diverticulosis    Female bladder prolapse    Fibroids    GERD (gastroesophageal reflux disease)    Heart murmur    Hx of adenomatous colonic polyps 08/26/2019   Hyperlipemia    Hypertension    IBS (irritable bowel syndrome)    Insomnia    Lip abscess 04/05/2016   Mitral valve prolapse    Peripheral vascular disease    Pre-diabetes 08/03/2019   Diet controlled     Right bundle branch block    Sciatic nerve disease    Shortness of breath    Stroke [facility]) March 2015     Family History  Problem Relation Age of Onset   Breast cancer Mother    Ovarian cancer Mother    AAA (abdominal aortic aneurysm) Mother    Emphysema Mother    Lung cancer Father    Emphysema Paternal Aunt    Pancreatic cancer Maternal Grandmother    Diabetes Maternal Aunt    Breast cancer Maternal Aunt    Heart disease Paternal Grandfather    Diabetes Paternal Grandfather    Kidney disease Maternal Aunt    Colon cancer Paternal Aunt  Colon polyps Neg Hx    Esophageal cancer Neg Hx    Rectal cancer Neg Hx    Stomach cancer Neg Hx      Social History   Socioeconomic History   Marital status: Divorced    Spouse name: Not on file   Number of children: 2   Years of education: ASSOCIATES   Highest education level: Not on file  Occupational History   Occupation: Dance Movement Psychotherapist  Tobacco Use   Smoking status: Former    Current packs/day: 0.00    Average packs/day: 1.5 packs/day for 47.1 years (70.6 ttl pk-yrs)    Types: Cigarettes    Start date: 12/03/1966    Quit date: 01/02/2014    Years since quitting: 10.6   Smokeless tobacco: Never   Tobacco comments:    Stopped for 6 months once in 2007  Vaping Use   Vaping status: Never Used  Substance and Sexual Activity   Alcohol use: No    Alcohol/week: 0.0 standard drinks  of alcohol   Drug use: No   Sexual activity: Not Currently  Other Topics Concern   Not on file  Social History Narrative   Patient is divorced with 2 children.   Patient is right handed.   Patient has her Associates degree.   Patient drinks 3 cups daily.      [facility] (07/30/17):   Originally from [place] [place] [facility]. Has always lived in [place]. Previously worked as a catering manager. Has a foster hospice dog. Previously had cockatoo and a parakeet for months. Did have mold in her current home that was remediated. Enjoys playing on the computer, reading, & babysitting.     Social Drivers of Corporate Investment Banker Strain: Not on file  Food Insecurity: Not on file  Transportation Needs: Not on file  Physical Activity: Not on file  Stress: Not on file  Social Connections: Not on file  Intimate Partner Violence: Not on file     Allergies  Allergen Reactions   Carvedilol Palpitations    Other reaction(s): palpitations   Buspirone Other (See Comments)    Reaction not recalled Other reaction(s): did not work   Dapsone Other (See Comments)    Flushing  Other reaction(s): flushed   Niacin And Related Other (See Comments)    Flushing    Paroxetine Hcl     Other reaction(s): dizziness, dreams   Septra [Sulfamethoxazole-Trimethoprim] Diarrhea and Nausea And Vomiting   Sertraline Hcl     Other reaction(s): skin crawling fuzzy head   Amoxicillin Rash and Other (See Comments)    Has patient had a PCN reaction causing immediate rash, facial/tongue/throat swelling, SOB or lightheadedness with hypotension: No Has patient had a PCN reaction causing severe rash involving mucus membranes or skin necrosis: No Has patient had a PCN reaction that required hospitalization No Has patient had a PCN reaction occurring within the last 10 years: No If all of the above answers are NO, then may proceed with Cephalosporin use. Other reaction(s): FLUSHED FEELING   Azithromycin  Other (See Comments)    Pt states  she can not take this due to her heart medication and heart condition.    Wellbutrin [Bupropion] Anxiety and Other (See Comments)    Caused the patient to feel jittery     Outpatient Medications Prior to Visit  Medication Sig Dispense Refill   albuterol  (PROVENTIL ) (2.5 MG/3ML) 0.083% nebulizer solution Take 3 mLs (2.5 mg total) by nebulization every 6 (six) hours as needed for wheezing or shortness  of breath. 75 mL 5   amLODipine  (NORVASC ) 5 MG tablet Take 5 mg by mouth daily.     atenolol  (TENORMIN ) 50 MG tablet Take 50 mg by mouth daily.     Cholecalciferol (VITAMIN D3) 5000 units CAPS Take 5,000 Units by mouth every evening.      clopidogrel  (PLAVIX ) 75 MG tablet Take 75 mg by mouth daily.      DULoxetine  (CYMBALTA ) 30 MG capsule Take 60 mg by mouth daily at 12 noon. Patient take 2 tablets daily.     Evolocumab  (REPATHA  SURECLICK) 140 MG/ML SOAJ Inject 140 mg into the skin every 14 (fourteen) days. INJECT UNDER THE SKIN EVERY 14 DAYS. 6 mL 3   ferrous sulfate 325 (65 FE) MG tablet Take 325 mg by mouth daily with breakfast.     fluticasone  (FLONASE ) 50 MCG/ACT nasal spray Place 1 spray into both nostrils daily.     Fluticasone -Umeclidin-Vilant (TRELEGY ELLIPTA ) 200-62.5-25 MCG/ACT AEPB Inhale 1 puff into the lungs daily. 60 each 5   Ipratropium-Albuterol  (COMBIVENT  RESPIMAT) 20-100 MCG/ACT AERS respimat Inhale 1 puff into the lungs every 4 (four) hours as needed for wheezing or shortness of breath. 4 g 11   isosorbide  mononitrate (IMDUR ) 30 MG 24 hr tablet Take 30 mg by mouth in the morning and at bedtime.  0   levothyroxine (SYNTHROID) 50 MCG tablet Take 50 mcg by mouth every morning.     lisinopril  (PRINIVIL ,ZESTRIL ) 40 MG tablet Take 40 mg by mouth daily.     LORazepam  (ATIVAN ) 0.5 MG tablet Take 0.5 mg by mouth See admin instructions. Take 0.5 mg by mouth at bedtime and an additional 0.5 mg once a day as needed for anxiety  0   pantoprazole  (PROTONIX ) 40 MG tablet Take 40 mg by  mouth in the morning and at bedtime.     simvastatin  (ZOCOR ) 20 MG tablet Take 40 mg by mouth daily at 6 PM.     azithromycin  (ZITHROMAX ) 250 MG tablet Take as directed (Patient not taking: Reported on 04/22/2024) 6 tablet 0   doxycycline  (VIBRA -TABS) 100 MG tablet Take 1 tablet (100 mg total) by mouth 2 (two) times daily. (Patient not taking: Reported on 04/22/2024) 14 tablet 0   Fluticasone -Umeclidin-Vilant (TRELEGY ELLIPTA ) 200-62.5-25 MCG/ACT AEPB Inhale 1 puff into the lungs daily.     predniSONE  (DELTASONE ) 10 MG tablet 4 tabs for 2 days, then 3 tabs for 2 days, 2 tabs for 2 days, then 1 tab for 2 days, then stop 20 tablet 0   No facility-administered medications prior to visit.    Review of Systems  Constitutional:  Negative for chills, fever, malaise/fatigue and weight loss.  HENT:  Negative for congestion, sinus pain and sore throat.   Eyes: Negative.   Respiratory:  Positive for shortness of breath. Negative for cough, hemoptysis, sputum production and wheezing.   Cardiovascular:  Negative for chest pain, palpitations, orthopnea, claudication and leg swelling.  Gastrointestinal:  Negative for abdominal pain, heartburn, nausea and vomiting.  Genitourinary: Negative.   Musculoskeletal:  Negative for joint pain and myalgias.  Skin:  Negative for rash.  Neurological:  Negative for weakness.  Endo/Heme/Allergies: Negative.   Psychiatric/Behavioral: Negative.      Objective:   Vitals:   08/11/24 1604  BP: 126/83  Pulse: 73  SpO2: 98%  Weight: 194 lb (88 kg)  Height: 5' 5 (1.651 m)    Physical Exam Constitutional:      General: She is not in acute distress.  Appearance: She is not ill-appearing.  HENT:     Head: Normocephalic and atraumatic.  Eyes:     General: No scleral icterus.    Conjunctiva/sclera: Conjunctivae normal.  Cardiovascular:     Rate and Rhythm: Normal rate and regular rhythm.     Pulses: Normal pulses.     Heart sounds: Normal heart sounds. No murmur  heard. Pulmonary:     Effort: Pulmonary effort is normal.     Breath sounds: Normal breath sounds. No wheezing, rhonchi or rales.  Musculoskeletal:     Right lower leg: No edema.     Left lower leg: No edema.  Neurological:     Mental Status: She is alert.    CBC    Component Value Date/Time   WBC 8.0 01/23/2024 1658   RBC 3.60 (L) 01/23/2024 1658   HGB 11.6 (L) 01/23/2024 1658   HCT 34.4 (L) 01/23/2024 1658   PLT 299 01/23/2024 1658   MCV 95.6 01/23/2024 1658   MCV 96.0 01/02/2014 1825   MCH 32.2 01/23/2024 1658   MCHC 33.7 01/23/2024 1658   RDW 12.6 01/23/2024 1658   LYMPHSABS 1.4 01/23/2024 1658   MONOABS 0.8 01/23/2024 1658   EOSABS 0.3 01/23/2024 1658   BASOSABS 0.1 01/23/2024 1658      Latest Ref Rng & Units 01/23/2024    4:58 PM 06/15/2022    3:55 PM 04/17/2022    3:56 PM  BMP  Glucose 70 - 99 mg/dL 818  870  847   BUN 8 - 23 mg/dL 23  17  26    Creatinine 0.44 - 1.00 mg/dL 8.62  8.52  8.55   Sodium 135 - 145 mmol/L 129  127  132   Potassium 3.5 - 5.1 mmol/L 4.3  4.8  4.7   Chloride 98 - 111 mmol/L 96  94  100   CO2 22 - 32 mmol/L 23  24  22    Calcium  8.9 - 10.3 mg/dL 9.7  9.9  89.6    Chest imaging: CXR 01/23/24 The heart size and mediastinal contours are within normal limits. Both lungs are clear. The visualized skeletal structures are unremarkable.  CTA Chest 06/15/22 Lungs/Pleura: Moderate centrilobular and paraseptal emphysematous changes, upper lung predominant. No suspicious pulmonary nodules. No focal consolidation. No pleural effusion or pneumothorax.  CTA Chest 06/26/21 Mediastinum/Nodes: Unremarkable CT appearance of the thyroid  gland. No suspicious mediastinal or hilar adenopathy. No soft tissue mediastinal mass. The thoracic esophagus is unremarkable.   Lungs/Pleura: Moderately severe centrilobular pulmonary emphysema. No suspicious pulmonary mass or nodule.  PFT:    Latest Ref Rng & Units 09/01/2017   11:55 AM  PFT Results  FVC-Pre L  1.92   FVC-Predicted Pre % 61   FVC-Post L 2.23   FVC-Predicted Post % 71   Pre FEV1/FVC % % 52   Post FEV1/FCV % % 56   FEV1-Pre L 1.00   FEV1-Predicted Pre % 42   FEV1-Post L 1.25   DLCO uncorrected ml/min/mmHg 12.39   DLCO UNC% % 49   DLCO corrected ml/min/mmHg 13.68   DLCO COR %Predicted % 54   DLVA Predicted % 68   TLC L 5.89   TLC % Predicted % 114   RV % Predicted % 175   PFT 2018: Moderately Severe Obstruction. Air trapping present. Moderate Diffusion defect.   Labs: Alpha 1 antitrypsin 07/30/17 is MM, level 162  Path:  Echo 06/21/21: LV EF 60-65%. Grade I diastolic dysfunction. RV systolic function is normal. RV size  is normal.   Heart Catheterization:  Assessment & Plan:   Asthma-COPD overlap syndrome (HCC)  Discussion: Leanette Eutsler is a 72 year old woman, former smoker with history of COPD who returns to pulmonary clinic for follow up.  She is to continue with trelegy Ellipta  1 puff daily and as needed combivent  inhaler use.   Follow up in 6 months  Dorn Chill, MD [facility] Office: [phone]   Current Outpatient Medications:    albuterol  (PROVENTIL ) (2.5 MG/3ML) 0.083% nebulizer solution, Take 3 mLs (2.5 mg total) by nebulization every 6 (six) hours as needed for wheezing or shortness of breath., Disp: 75 mL, Rfl: 5   amLODipine  (NORVASC ) 5 MG tablet, Take 5 mg by mouth daily., Disp: , Rfl:    atenolol  (TENORMIN ) 50 MG tablet, Take 50 mg by mouth daily., Disp: , Rfl:    Cholecalciferol (VITAMIN D3) 5000 units CAPS, Take 5,000 Units by mouth every evening. , Disp: , Rfl:    clopidogrel  (PLAVIX ) 75 MG tablet, Take 75 mg by mouth daily. , Disp: , Rfl:    DULoxetine  (CYMBALTA ) 30 MG capsule, Take 60 mg by mouth daily at 12 noon. Patient take 2 tablets daily., Disp: , Rfl:    Evolocumab  (REPATHA  SURECLICK) 140 MG/ML SOAJ, Inject 140 mg into the skin every 14 (fourteen) days. INJECT UNDER THE SKIN EVERY 14 DAYS., Disp: 6 mL,  Rfl: 3   ferrous sulfate 325 (65 FE) MG tablet, Take 325 mg by mouth daily with breakfast., Disp: , Rfl:    fluticasone  (FLONASE ) 50 MCG/ACT nasal spray, Place 1 spray into both nostrils daily., Disp: , Rfl:    Fluticasone -Umeclidin-Vilant (TRELEGY ELLIPTA ) 200-62.5-25 MCG/ACT AEPB, Inhale 1 puff into the lungs daily., Disp: 60 each, Rfl: 5   Ipratropium-Albuterol  (COMBIVENT  RESPIMAT) 20-100 MCG/ACT AERS respimat, Inhale 1 puff into the lungs every 4 (four) hours as needed for wheezing or shortness of breath., Disp: 4 g, Rfl: 11   isosorbide  mononitrate (IMDUR ) 30 MG 24 hr tablet, Take 30 mg by mouth in the morning and at bedtime., Disp: , Rfl: 0   levothyroxine (SYNTHROID) 50 MCG tablet, Take 50 mcg by mouth every morning., Disp: , Rfl:    lisinopril  (PRINIVIL ,ZESTRIL ) 40 MG tablet, Take 40 mg by mouth daily., Disp: , Rfl:    LORazepam  (ATIVAN ) 0.5 MG tablet, Take 0.5 mg by mouth See admin instructions. Take 0.5 mg by mouth at bedtime and an additional 0.5 mg once a day as needed for anxiety, Disp: , Rfl: 0   pantoprazole  (PROTONIX ) 40 MG tablet, Take 40 mg by mouth in the morning and at bedtime., Disp: , Rfl:    simvastatin  (ZOCOR ) 20 MG tablet, Take 40 mg by mouth daily at 6 PM., Disp: , Rfl:

## 2024-08-16 ENCOUNTER — Encounter: Payer: Self-pay | Admitting: Pulmonary Disease

## 2024-08-17 ENCOUNTER — Emergency Department (HOSPITAL_COMMUNITY)

## 2024-08-17 ENCOUNTER — Other Ambulatory Visit: Payer: Self-pay

## 2024-08-17 ENCOUNTER — Emergency Department (HOSPITAL_COMMUNITY)
Admission: EM | Admit: 2024-08-17 | Discharge: 2024-08-17 | Disposition: A | Discharge status: Home / Self Care | Attending: Emergency Medicine | Admitting: Emergency Medicine

## 2024-08-17 DIAGNOSIS — J449 Chronic obstructive pulmonary disease, unspecified: Secondary | ICD-10-CM | POA: Diagnosis not present

## 2024-08-17 DIAGNOSIS — I10 Essential (primary) hypertension: Secondary | ICD-10-CM | POA: Insufficient documentation

## 2024-08-17 DIAGNOSIS — Y92009 Unspecified place in unspecified non-institutional (private) residence as the place of occurrence of the external cause: Secondary | ICD-10-CM | POA: Insufficient documentation

## 2024-08-17 DIAGNOSIS — S0003XA Contusion of scalp, initial encounter: Secondary | ICD-10-CM | POA: Diagnosis not present

## 2024-08-17 DIAGNOSIS — M47812 Spondylosis without myelopathy or radiculopathy, cervical region: Secondary | ICD-10-CM | POA: Diagnosis not present

## 2024-08-17 DIAGNOSIS — S43034A Inferior dislocation of right humerus, initial encounter: Secondary | ICD-10-CM | POA: Diagnosis not present

## 2024-08-17 DIAGNOSIS — S43004A Unspecified dislocation of right shoulder joint, initial encounter: Secondary | ICD-10-CM | POA: Diagnosis not present

## 2024-08-17 DIAGNOSIS — R110 Nausea: Secondary | ICD-10-CM | POA: Diagnosis not present

## 2024-08-17 DIAGNOSIS — M9961 Osseous and subluxation stenosis of intervertebral foramina of cervical region: Secondary | ICD-10-CM | POA: Diagnosis not present

## 2024-08-17 DIAGNOSIS — W19XXXA Unspecified fall, initial encounter: Secondary | ICD-10-CM | POA: Diagnosis not present

## 2024-08-17 DIAGNOSIS — Z79899 Other long term (current) drug therapy: Secondary | ICD-10-CM | POA: Insufficient documentation

## 2024-08-17 DIAGNOSIS — R9089 Other abnormal findings on diagnostic imaging of central nervous system: Secondary | ICD-10-CM | POA: Diagnosis not present

## 2024-08-17 DIAGNOSIS — W01198A Fall on same level from slipping, tripping and stumbling with subsequent striking against other object, initial encounter: Secondary | ICD-10-CM | POA: Insufficient documentation

## 2024-08-17 DIAGNOSIS — S43014A Anterior dislocation of right humerus, initial encounter: Secondary | ICD-10-CM | POA: Insufficient documentation

## 2024-08-17 DIAGNOSIS — S4991XA Unspecified injury of right shoulder and upper arm, initial encounter: Secondary | ICD-10-CM | POA: Diagnosis not present

## 2024-08-17 LAB — I-STAT CHEM 8, ED
BUN: 27 mg/dL — ABNORMAL HIGH (ref 8–23)
Calcium, Ion: 1.2 mmol/L (ref 1.15–1.40)
Chloride: 100 mmol/L (ref 98–111)
Creatinine, Ser: 1.6 mg/dL — ABNORMAL HIGH (ref 0.44–1.00)
Glucose, Bld: 168 mg/dL — ABNORMAL HIGH (ref 70–99)
HCT: 38 % (ref 36.0–46.0)
Hemoglobin: 12.9 g/dL (ref 12.0–15.0)
Potassium: 4.4 mmol/L (ref 3.5–5.1)
Sodium: 132 mmol/L — ABNORMAL LOW (ref 135–145)
TCO2: 22 mmol/L (ref 22–32)

## 2024-08-17 LAB — I-STAT CG4 LACTIC ACID, ED: Lactic Acid, Venous: 1.5 mmol/L (ref 0.5–1.9)

## 2024-08-17 MED ORDER — MORPHINE SULFATE (PF) 4 MG/ML IV SOLN
4.0000 mg | Freq: Once | INTRAVENOUS | Status: AC
  Administered 2024-08-17: 4 mg via INTRAVENOUS
  Filled 2024-08-17: qty 1

## 2024-08-17 MED ORDER — PROPOFOL 10 MG/ML IV BOLUS
INTRAVENOUS | Status: AC | PRN
  Administered 2024-08-17: 44 mg via INTRAVENOUS

## 2024-08-17 MED ORDER — PROPOFOL 10 MG/ML IV BOLUS
0.5000 mg/kg | Freq: Once | INTRAVENOUS | Status: DC
  Filled 2024-08-17: qty 20

## 2024-08-17 MED ORDER — ONDANSETRON HCL 4 MG/2ML IJ SOLN
4.0000 mg | Freq: Once | INTRAMUSCULAR | Status: AC
  Administered 2024-08-17: 4 mg via INTRAVENOUS
  Filled 2024-08-17: qty 2

## 2024-08-17 MED ORDER — TRAMADOL HCL 50 MG PO TABS: 50.0000 mg | ORAL_TABLET | Freq: Four times a day (QID) | ORAL | 0 refills | Status: DC | PRN

## 2024-08-17 NOTE — ED Notes (Signed)
Dr. Horton at bedside. 

## 2024-08-17 NOTE — Progress Notes (Signed)
 Orthopedic Tech Progress Note Patient Details:  WESLEE PRESTAGE 1951-08-12 [id number]  Ortho Devices Type of Ortho Device: Sling immobilizer Ortho Device/Splint Location: rue Ortho Device/Splint Interventions: Ordered, Adjustment  I applied sling post reduction. Post Interventions Patient Tolerated: Well Instructions Provided: Care of device, Adjustment of device  Chandra Dorn PARAS 08/17/2024, 2:30 AM

## 2024-08-17 NOTE — ED Provider Notes (Signed)
 [place] EMERGENCY DEPARTMENT AT [facility] Provider Note   CSN: [id number] Arrival date & time: 08/17/24  0021     Patient presents with: Fall, Head Injury, and Shoulder Injury   Shannon Roberts is a 73 y.o. female.   HPI     This is a 73 year old female who presents by EMS as a level 2 trauma.  Patient reports that she went to kill a bug when she lost her balance and fell forward hitting her head and striking her right shoulder.  She is right-handed.  She did not lose consciousness.  She is reporting a headache and right shoulder pain.  Got nauseous after fentanyl  and route.  Prior to Admission medications   Medication Sig Start Date End Date Taking? Authorizing Provider  traMADol  (ULTRAM ) 50 MG tablet Take 1 tablet (50 mg total) by mouth every 6 (six) hours as needed. 08/17/24  Yes Satin Boal, Charmaine FALCON, MD  albuterol  (PROVENTIL ) (2.5 MG/3ML) 0.083% nebulizer solution Take 3 mLs (2.5 mg total) by nebulization every 6 (six) hours as needed for wheezing or shortness of breath. 07/01/24   Kara Dorn NOVAK, MD  amLODipine  (NORVASC ) 5 MG tablet Take 5 mg by mouth daily. 03/11/21   [provider]  atenolol  (TENORMIN ) 50 MG tablet Take 50 mg by mouth daily.    [provider]  Cholecalciferol (VITAMIN D3) 5000 units CAPS Take 5,000 Units by mouth every evening.     [provider]  clopidogrel  (PLAVIX ) 75 MG tablet Take 75 mg by mouth daily.     [provider]  DULoxetine  (CYMBALTA ) 30 MG capsule Take 60 mg by mouth daily at 12 noon. Patient take 2 tablets daily.    [provider]  Evolocumab  (REPATHA  SURECLICK) 140 MG/ML SOAJ Inject 140 mg into the skin every 14 (fourteen) days. INJECT UNDER THE SKIN EVERY 14 DAYS. 06/08/24   O'NealDarryle Ned, MD  ferrous sulfate 325 (65 FE) MG tablet Take 325 mg by mouth daily with breakfast.    [provider]  fluticasone  (FLONASE ) 50 MCG/ACT nasal spray Place 1 spray into  both nostrils daily.    [provider]  Fluticasone -Umeclidin-Vilant (TRELEGY ELLIPTA ) 200-62.5-25 MCG/ACT AEPB Inhale 1 puff into the lungs daily. 04/22/24   Hope Almarie ORN, NP  Ipratropium-Albuterol  (COMBIVENT  RESPIMAT) 20-100 MCG/ACT AERS respimat Inhale 1 puff into the lungs every 4 (four) hours as needed for wheezing or shortness of breath. 08/07/21   Kara Dorn NOVAK, MD  isosorbide  mononitrate (IMDUR ) 30 MG 24 hr tablet Take 30 mg by mouth in the morning and at bedtime. 09/24/14   [provider]  levothyroxine (SYNTHROID) 50 MCG tablet Take 50 mcg by mouth every morning.    [provider]  lisinopril  (PRINIVIL ,ZESTRIL ) 40 MG tablet Take 40 mg by mouth daily.    [provider]  LORazepam  (ATIVAN ) 0.5 MG tablet Take 0.5 mg by mouth See admin instructions. Take 0.5 mg by mouth at bedtime and an additional 0.5 mg once a day as needed for anxiety    [provider]  pantoprazole  (PROTONIX ) 40 MG tablet Take 40 mg by mouth in the morning and at bedtime.    [provider]  simvastatin  (ZOCOR ) 20 MG tablet Take 40 mg by mouth daily at 6 PM. 06/06/22   [provider]    Allergies: Carvedilol, Buspirone, Dapsone, Niacin and related, Paroxetine hcl, Septra [sulfamethoxazole-trimethoprim], Sertraline hcl, Amoxicillin, Azithromycin , and Wellbutrin [bupropion]    Review of Systems  Constitutional:  Negative for fever.  Respiratory:  Negative for shortness of breath.   Cardiovascular:  Negative for chest pain.  Musculoskeletal:        Shoulder pain  Neurological:  Positive for headaches.  All other systems reviewed and are negative.   Updated Vital Signs BP 128/83   Pulse 83   Temp 98.7 F (37.1 C) (Oral)   Resp 17   SpO2 97%   Physical Exam Vitals and nursing note reviewed.  Constitutional:      Appearance: She is well-developed.  HENT:     Head: Normocephalic.     Comments: Hematoma left forehead    Mouth/Throat:      Mouth: Mucous membranes are moist.  Eyes:     Pupils: Pupils are equal, round, and reactive to light.  Cardiovascular:     Rate and Rhythm: Normal rate and regular rhythm.     Heart sounds: Normal heart sounds.  Pulmonary:     Effort: Pulmonary effort is normal. No respiratory distress.     Breath sounds: No wheezing.  Abdominal:     Palpations: Abdomen is soft.  Musculoskeletal:     Cervical back: Neck supple.     Comments: Anterior dislocation of the humeral head on the right, 2+ radial pulse, neurovascular intact distally  Skin:    General: Skin is warm and dry.  Neurological:     Mental Status: She is alert and oriented to person, place, and time.  Psychiatric:        Mood and Affect: Mood normal.     (all labs ordered are listed, but only abnormal results are displayed) Labs Reviewed  I-STAT CHEM 8, ED - Abnormal; Notable for the following components:      Result Value   Sodium 132 (*)    BUN 27 (*)    Creatinine, Ser 1.60 (*)    Glucose, Bld 168 (*)    All other components within normal limits  I-STAT CG4 LACTIC ACID, ED    EKG: None  Radiology: DG Shoulder 1 View Right Result Date: 08/17/2024 EXAM: 1 VIEW XRAY OF THE RIGHT SHOULDER 08/17/2024 02:14:00 AM COMPARISON: 3-view right shoulder series earlier today and a right shoulder series from 03/08/2024. CLINICAL HISTORY: reduction of right shoulder. Reduction right shoulder reduction of right shoulder. Reduction right shoulder FINDINGS: BONES AND JOINTS: A single AP view of the right shoulder demonstrates interval reduction of the prior glenohumeral dislocation. There is a Hill-Sachs wedge impaction deformity of the superolateral aspect of the right humeral head. This has increased in conspicuity compared to a right shoulder series from 03/08/2024. No glenoid fracture fragment is seen. The [facility] joint is intact with mild inferior spurring. SOFT TISSUES: No abnormal calcifications. Visualized lung is unremarkable.  IMPRESSION: 1. Successful interval reduction of the prior right glenohumeral dislocation. 2. Hill-Sachs impaction deformity of the superolateral right humeral head, increased in conspicuity compared to 03/08/2024. 3. No glenoid fracture fragment identified. Electronically signed by: Francis Quam MD 08/17/2024 02:26 AM EDT RP Workstation: [id number]   CT Head Wo Contrast Result Date: 08/17/2024 EXAM: CT HEAD AND CERVICAL SPINE 08/17/2024 01:32:52 AM TECHNIQUE: CT of the head and cervical spine was performed without the administration of intravenous contrast. Multiplanar reformatted images are provided for review. Automated exposure control, iterative reconstruction, and/or weight based adjustment of the mA/kV was utilized to reduce the radiation dose to as low as reasonably achievable. COMPARISON: None available. CLINICAL HISTORY: FINDINGS: CT HEAD BRAIN AND VENTRICLES: No acute intracranial hemorrhage. No  mass effect or midline shift. No abnormal extra-axial fluid collection. No evidence of acute infarct. No hydrocephalus. Cerebral and cerebellar volume loss. Patchy and confluent areas of decreased attenuation are noted throughout the deep and periventricular white matter of the cerebral hemispheres bilaterally, suggestive of chronic microvascular ischemic changes. ORBITS: Bilateral lens replacement. SINUSES AND MASTOIDS: No acute abnormality. SOFT TISSUES AND SKULL: 11 mm left frontal scalp hematoma. No acute skull fracture. CT CERVICAL SPINE BONES AND ALIGNMENT: No acute fracture or traumatic malalignment. DEGENERATIVE CHANGES: Small multilevel moderate degenerative changes of the spine. C3-C4: Moderate-to-severe osseous neural foraminal stenosis. No severe osseous central canal stenosis. SOFT TISSUES: Emphysematous changes with atherosclerotic plaque within the carotid arteries within the neck. No prevertebral soft tissue swelling. IMPRESSION: 1. No acute intracranial abnormality. 2. No acute fracture or  traumatic malalignment of the cervical spine. 3. Moderate-to-severe osseous neural foraminal stenosis at C3-C4. Electronically signed by: Morgane Naveau MD 08/17/2024 01:48 AM EDT RP Workstation: [id number]   CT Cervical Spine Wo Contrast Result Date: 08/17/2024 EXAM: CT HEAD AND CERVICAL SPINE 08/17/2024 01:32:52 AM TECHNIQUE: CT of the head and cervical spine was performed without the administration of intravenous contrast. Multiplanar reformatted images are provided for review. Automated exposure control, iterative reconstruction, and/or weight based adjustment of the mA/kV was utilized to reduce the radiation dose to as low as reasonably achievable. COMPARISON: None available. CLINICAL HISTORY: FINDINGS: CT HEAD BRAIN AND VENTRICLES: No acute intracranial hemorrhage. No mass effect or midline shift. No abnormal extra-axial fluid collection. No evidence of acute infarct. No hydrocephalus. Cerebral and cerebellar volume loss. Patchy and confluent areas of decreased attenuation are noted throughout the deep and periventricular white matter of the cerebral hemispheres bilaterally, suggestive of chronic microvascular ischemic changes. ORBITS: Bilateral lens replacement. SINUSES AND MASTOIDS: No acute abnormality. SOFT TISSUES AND SKULL: 11 mm left frontal scalp hematoma. No acute skull fracture. CT CERVICAL SPINE BONES AND ALIGNMENT: No acute fracture or traumatic malalignment. DEGENERATIVE CHANGES: Small multilevel moderate degenerative changes of the spine. C3-C4: Moderate-to-severe osseous neural foraminal stenosis. No severe osseous central canal stenosis. SOFT TISSUES: Emphysematous changes with atherosclerotic plaque within the carotid arteries within the neck. No prevertebral soft tissue swelling. IMPRESSION: 1. No acute intracranial abnormality. 2. No acute fracture or traumatic malalignment of the cervical spine. 3. Moderate-to-severe osseous neural foraminal stenosis at C3-C4. Electronically signed by:  Morgane Naveau MD 08/17/2024 01:48 AM EDT RP Workstation: [id number]   DG Shoulder Right Portable Result Date: 08/17/2024 EXAM: 1 VIEW XRAY OF THE RIGHT SHOULDER 08/17/2024 12:38:00 AM COMPARISON: None available. CLINICAL HISTORY: fall. fall fall. fall FINDINGS: BONES AND JOINTS: There is anterior inferior dislocation of the humeral head in relation to the glenoid. No fractures are identified. The [facility] joint is unremarkable in appearance. SOFT TISSUES: No abnormal calcifications. Visualized lung is unremarkable. IMPRESSION: 1. Anterior inferior dislocation of the right humeral head in relation to the glenoid. No fractures identified. Electronically signed by: Greig Pique MD 08/17/2024 12:44 AM EDT RP Workstation: [id number]     .Reduction of dislocation  Date/Time: 08/17/2024 3:55 AM  Performed by: Bari Charmaine FALCON, MD Authorized by: Bari Charmaine FALCON, MD  Consent: Written consent obtained Risks and benefits: risks, benefits and alternatives were discussed Consent given by: patient Patient understanding: patient states understanding of the procedure being performed Time out: Immediately prior to procedure a time out was called to verify the correct patient, procedure, equipment, support staff and site/side marked as required. Local anesthesia used: no  Anesthesia: Local anesthesia used: no  Sedation: Patient sedated: yes Sedation type: moderate (conscious) sedation Sedatives: propofol  Sedation start date/time: 08/17/2024 2:00 AM Sedation end date/time: 08/17/2024 2:15 AM Vitals: Vital signs were monitored during sedation.  Patient tolerance: patient tolerated the procedure well with no immediate complications      Medications Ordered in the ED  propofol  (DIPRIVAN ) 10 mg/mL bolus/IV push 44 mg (44 mg Intravenous Not Given 08/17/24 0212)  morphine  (PF) 4 MG/ML injection 4 mg (4 mg Intravenous Given 08/17/24 0118)  ondansetron  (ZOFRAN ) injection 4 mg (4 mg Intravenous Given  08/17/24 0118)  propofol  (DIPRIVAN ) 10 mg/mL bolus/IV push (44 mg Intravenous Given 08/17/24 0207)    Clinical Course as of 08/17/24 0355  Tue Aug 17, 2024  0218 Patient sedated for right shoulder dislocation reduction.  Reduction without any complication.  Bedside x-ray confirms reduction. [CH]    Clinical Course User Index [CH] Alyce Inscore, Charmaine FALCON, MD                                 Medical Decision Making Amount and/or Complexity of Data Reviewed Radiology: ordered.  Risk Prescription drug management.   This patient presents to the ED for concern of fall, this involves an extensive number of treatment options, and is a complaint that carries with it a high risk of complications and morbidity.  I considered the following differential and admission for this acute, potentially life threatening condition.  The differential diagnosis includes head injury, neck injury, long bone injury, dislocation, fracture  MDM:   This is a 73 year old female who presents following a fall.  Hematoma to forehead noted.  Also obvious right shoulder dislocation.  CT head neck obtained and showed no evidence of acute bleed.  Shoulder was relocated without difficulty under conscious sedation.  Postreduction films show persistent Hill-Sachs deformity from prior x-rays.  Patient was placed in sling.  Will have her follow-up with orthopedics.  She was ambulatory at her baseline.  Will discharge home with short course of pain medication.   (Labs, imaging, consults)  Labs: I Ordered, and personally interpreted labs.  The pertinent results include: None  Imaging Studies ordered: I ordered imaging studies including x-ray, postreduction films I independently visualized and interpreted imaging. I agree with the radiologist interpretation  Additional history obtained from chart review.  External records from outside source obtained and reviewed including prior evaluations  Cardiac Monitoring: The patient was  maintained on a cardiac monitor.  If on the cardiac monitor, I personally viewed and interpreted the cardiac monitored which showed an underlying rhythm of: Sinus  Reevaluation: After the interventions noted above, I reevaluated the patient and found that they have :improved  Social Determinants of Health:  lives independently  Disposition: Discharge  Co morbidities that complicate the patient evaluation  Past Medical History:  Diagnosis Date   Allergic rhinitis    Anemia    per hosp. - recently- pt. told that she is anemic   Anxiety    Colitis    COPD (chronic obstructive pulmonary disease) (HCC)    no o2   Depression    Diverticulosis    Female bladder prolapse    Fibroids    GERD (gastroesophageal reflux disease)    Heart murmur    Hx of adenomatous colonic polyps 08/26/2019   Hyperlipemia    Hypertension    IBS (irritable bowel syndrome)    Insomnia    Lip abscess 04/05/2016   Mitral valve prolapse  Peripheral vascular disease    Pre-diabetes 08/03/2019   Diet controlled     Right bundle branch block    Sciatic nerve disease    Shortness of breath    Stroke [facility]) March 2015     Medicines Meds ordered this encounter  Medications   morphine  (PF) 4 MG/ML injection 4 mg   ondansetron  (ZOFRAN ) injection 4 mg   propofol  (DIPRIVAN ) 10 mg/mL bolus/IV push 44 mg   propofol  (DIPRIVAN ) 10 mg/mL bolus/IV push   traMADol  (ULTRAM ) 50 MG tablet    Sig: Take 1 tablet (50 mg total) by mouth every 6 (six) hours as needed.    Dispense:  15 tablet    Refill:  0    I have reviewed the patients home medicines and have made adjustments as needed  Problem List / ED Course: Problem List Items Addressed This Visit   None Visit Diagnoses       Dislocation of right shoulder joint, initial encounter    -  Primary     Hematoma of scalp, initial encounter                    Final diagnoses:  Dislocation of right shoulder joint, initial encounter  Hematoma of scalp,  initial encounter    ED Discharge Orders          Ordered    traMADol  (ULTRAM ) 50 MG tablet  Every 6 hours PRN        08/17/24 0350               Bari Charmaine FALCON, MD 08/17/24 [phone]

## 2024-08-17 NOTE — ED Notes (Signed)
 Patient ambulated with no issues or assistance.

## 2024-08-17 NOTE — Progress Notes (Signed)
 Orthopedic Tech Progress Note Patient Details:  Shannon Roberts 1951-02-06 [id number]  Patient ID: Shannon Roberts, female   DOB: [birth year], 73 y.o.   MRN: [id number] I attended trauma page. Chandra Dorn PARAS 08/17/2024, 12:44 AM

## 2024-08-17 NOTE — Sedation Documentation (Signed)
 Consent signed by both EDP and pt; located at the bedside

## 2024-08-17 NOTE — ED Triage Notes (Addendum)
 Pt to ED via [facility] after fall at home. Pt fell while bending over to kill bug. Possible dislocation to right shoulder. Pt hit head, hematoma to left forehead. Denies LOC. Pt taking plavix  (prev stroke). 20 G L wrist. 50 mcg fentanyl  given en route. Reports pt getting nauseous and hot after fentanyl  injection. 4 mg zofran  given IV.

## 2024-08-17 NOTE — Discharge Instructions (Signed)
 You were seen today and had a shoulder dislocation after a fall.  Follow-up with your orthopedics or Dr. Beverley if you do not have an orthopedist.  Keep sling in place for comfort.  Avoid extreme postures with the right shoulder as you can have recurrent dislocation.

## 2024-08-25 DIAGNOSIS — S43014A Anterior dislocation of right humerus, initial encounter: Secondary | ICD-10-CM | POA: Diagnosis not present

## 2024-09-02 DIAGNOSIS — S43014D Anterior dislocation of right humerus, subsequent encounter: Secondary | ICD-10-CM | POA: Diagnosis not present

## 2024-09-08 DIAGNOSIS — S43014D Anterior dislocation of right humerus, subsequent encounter: Secondary | ICD-10-CM | POA: Diagnosis not present

## 2024-09-10 DIAGNOSIS — S43014D Anterior dislocation of right humerus, subsequent encounter: Secondary | ICD-10-CM | POA: Diagnosis not present

## 2024-09-13 DIAGNOSIS — M25311 Other instability, right shoulder: Secondary | ICD-10-CM | POA: Diagnosis not present

## 2024-09-13 DIAGNOSIS — S43014D Anterior dislocation of right humerus, subsequent encounter: Secondary | ICD-10-CM | POA: Diagnosis not present

## 2024-09-14 ENCOUNTER — Other Ambulatory Visit: Payer: Self-pay | Admitting: Orthopedic Surgery

## 2024-09-14 DIAGNOSIS — S43014D Anterior dislocation of right humerus, subsequent encounter: Secondary | ICD-10-CM | POA: Diagnosis not present

## 2024-09-14 DIAGNOSIS — M25111 Fistula, right shoulder: Secondary | ICD-10-CM

## 2024-09-22 DIAGNOSIS — S43014D Anterior dislocation of right humerus, subsequent encounter: Secondary | ICD-10-CM | POA: Diagnosis not present

## 2024-09-26 DIAGNOSIS — R0981 Nasal congestion: Secondary | ICD-10-CM | POA: Diagnosis not present

## 2024-09-26 DIAGNOSIS — J209 Acute bronchitis, unspecified: Secondary | ICD-10-CM | POA: Diagnosis not present

## 2024-09-26 DIAGNOSIS — U071 COVID-19: Secondary | ICD-10-CM | POA: Diagnosis not present

## 2024-09-26 DIAGNOSIS — R051 Acute cough: Secondary | ICD-10-CM | POA: Diagnosis not present

## 2024-09-27 ENCOUNTER — Telehealth: Payer: Self-pay

## 2024-09-27 NOTE — Telephone Encounter (Signed)
 Copied from CRM #[id number]. Topic: Clinical - Medical Advice >> Sep 27, 2024  9:21 AM Isabell A wrote: Reason for CRM: Patient requesting to speak with Dr.Dewalds nurse - patient was diagnosed with COVID yesterday, patient would like to confirm she is on enough antibiotics.  Callback number: [phone]

## 2024-09-27 NOTE — Telephone Encounter (Signed)
 Spoke with patient on phone.   She has tested postive for Covid 12/7 at visit with her primary care. She started with symptoms on 12/4 with sore throat. She started paxlovid and 40mg  prednisone  on 12/7. She also started doxycycline  twice daily. Recommended she reduced prednisone  to 20mg  daily from 40mg  daily as it has caused elevations in her blood pressure.   Let her know to go to the ER if her breathing worsens. She has oximiter at home to monitor her oxygen levels.  Dorn Chill, MD [facility] Office: [phone]   See Amion for personal pager PCCM on call pager [phone] until 7pm. Please call Elink 7p-7a. [phone]

## 2024-09-28 ENCOUNTER — Other Ambulatory Visit: Payer: Self-pay

## 2024-09-28 ENCOUNTER — Emergency Department (HOSPITAL_COMMUNITY)
Admission: EM | Admit: 2024-09-28 | Discharge: 2024-09-29 | Disposition: A | Attending: Emergency Medicine | Admitting: Emergency Medicine

## 2024-09-28 ENCOUNTER — Ambulatory Visit: Payer: Self-pay

## 2024-09-28 ENCOUNTER — Telehealth: Payer: Self-pay | Admitting: Cardiovascular Disease

## 2024-09-28 ENCOUNTER — Other Ambulatory Visit

## 2024-09-28 ENCOUNTER — Encounter (HOSPITAL_COMMUNITY): Payer: Self-pay

## 2024-09-28 DIAGNOSIS — I10 Essential (primary) hypertension: Secondary | ICD-10-CM | POA: Diagnosis not present

## 2024-09-28 DIAGNOSIS — R110 Nausea: Secondary | ICD-10-CM | POA: Diagnosis not present

## 2024-09-28 DIAGNOSIS — J449 Chronic obstructive pulmonary disease, unspecified: Secondary | ICD-10-CM | POA: Diagnosis not present

## 2024-09-28 DIAGNOSIS — R42 Dizziness and giddiness: Secondary | ICD-10-CM | POA: Diagnosis not present

## 2024-09-28 DIAGNOSIS — R112 Nausea with vomiting, unspecified: Secondary | ICD-10-CM | POA: Diagnosis not present

## 2024-09-28 DIAGNOSIS — U071 COVID-19: Secondary | ICD-10-CM | POA: Diagnosis not present

## 2024-09-28 DIAGNOSIS — R531 Weakness: Secondary | ICD-10-CM | POA: Diagnosis not present

## 2024-09-28 LAB — CBC WITH DIFFERENTIAL/PLATELET
Abs Immature Granulocytes: 0.11 K/uL — ABNORMAL HIGH (ref 0.00–0.07)
Basophils Absolute: 0 K/uL (ref 0.0–0.1)
Basophils Relative: 0 %
Eosinophils Absolute: 0 K/uL (ref 0.0–0.5)
Eosinophils Relative: 0 %
HCT: 36.1 % (ref 36.0–46.0)
Hemoglobin: 13 g/dL (ref 12.0–15.0)
Immature Granulocytes: 1 %
Lymphocytes Relative: 6 %
Lymphs Abs: 0.6 K/uL — ABNORMAL LOW (ref 0.7–4.0)
MCH: 31.8 pg (ref 26.0–34.0)
MCHC: 36 g/dL (ref 30.0–36.0)
MCV: 88.3 fL (ref 80.0–100.0)
Monocytes Absolute: 0.4 K/uL (ref 0.1–1.0)
Monocytes Relative: 4 %
Neutro Abs: 9.2 K/uL — ABNORMAL HIGH (ref 1.7–7.7)
Neutrophils Relative %: 89 %
Platelets: 332 K/uL (ref 150–400)
RBC: 4.09 MIL/uL (ref 3.87–5.11)
RDW: 12.2 % (ref 11.5–15.5)
WBC: 10.4 K/uL (ref 4.0–10.5)
nRBC: 0 % (ref 0.0–0.2)

## 2024-09-28 MED ORDER — ONDANSETRON 4 MG PO TBDP
4.0000 mg | ORAL_TABLET | Freq: Once | ORAL | Status: AC
  Administered 2024-09-28: 4 mg via ORAL
  Filled 2024-09-28: qty 1

## 2024-09-28 NOTE — Telephone Encounter (Signed)
 FYI Only or Action Required?: FYI only for provider: Advised pt to f/u with PCP office who prescribed medication for recent covid dx.  Patient is followed in Pulmonology for COPD and asthma, last seen on 08/11/2024 by Kara Dorn NOVAK, MD.  Called Nurse Triage reporting Nausea.  Symptoms began yesterday.  Interventions attempted: OTC medications: Pepto bismol, Rescue inhaler, and Maintenance inhaler. Combivent , trellegy  Symptoms are: gradually worsening.  Triage Disposition: Call PCP Within 24 Hours  Patient/caregiver understands and will follow disposition?:   E2C2 Pulmonary Triage - Initial Assessment Questions "Chief Complaint (e.g., cough, sob, wheezing, fever, chills, sweat or additional symptoms) *Go to specific symptom protocol after initial questions. Nausea after recent dx with covid and starting new meds paxlovid, prednisone  and doxycycline   "How long have symptoms been present?" Monday  Have you tested for COVID or Flu? Note: If not, ask patient if a home test can be taken. If so, instruct patient to call back for positive results. Yes, positive for covid on 12/7  MEDICINES:   "Have you used any OTC meds to help with symptoms?" Yes If yes, ask "What medications?" Pepto bismol  "Have you used your inhalers/maintenance medication?" Yes If yes, "What medications?" Trelegy   If inhaler, ask "How many puffs and how often?" Note: Review instructions on medication in the chart. Combivent  1x/daily  OXYGEN: "Do you wear supplemental oxygen?" No If yes, "How many liters are you supposed to use?" Denies  "Do you monitor your oxygen levels?" Yes If yes, What is your reading (oxygen level) today? 97%  What is your usual oxygen saturation reading?  (Note: Pulmonary O2 sats should be 90% or greater) High 90's   Message from [facility] sent at 09/28/2024  3:29 PM EST  Reason for Triage: pt state she started paxlovid on Sunday due to her having COVID stated she  has had  nausea since then. She is concerned with this   Reason for Disposition  Taking prescription medication that could cause nausea (e.g., narcotics/opiates, antibiotics, OCPs, many others)  Answer Assessment - Initial Assessment Questions Pt dx with covid on 12/7. Mild SOB with exertion, denies fever or CP.  BP 125/62 and 69 HR currently. Started on doxycycline , prednisone  and paxlovid on 12/7 by PCP with [facility]. Reports onset of severe nausea which has caused moderate intermittent dizziness Monday morning. Denies emesis. Pt concerned she is having a reaction to paxlovid as she has take the abx and steroid in the past w/o issue. Pt not established with  PCP, advised she call PCP office who prescribed the medication in next 24 hours for guidance. Pt agreeable to do do. Advised UC if unable to reach her providers office or for worsening symptoms   1. NAUSEA SEVERITY: How bad is the nausea? (e.g., mild, moderate, severe; dehydration, weight loss)     Severe  2. ONSET: When did the nausea begin?     Monday  3. VOMITING: Any vomiting? If Yes, ask: How many times today?     Denies  4. RECURRENT SYMPTOM: Have you had nausea before? If Yes, ask: When was the last time? What happened that time?     Denies  5. CAUSE: What do you think is causing the nausea?     Pt unsure, thought it was maybe new rx for paxlovid for covid  Protocols used: [facility]

## 2024-09-28 NOTE — ED Triage Notes (Signed)
 Pt BIB [facility] d/t Nausea,dizziness and weakness.  Pt has confirmed COVID on Sunday and taking Paxlovid. Now feels worse. Denies vomiting.  BP 202/110 HR 106 O2 99 RA Cbg 186 97.7 temporal temp

## 2024-09-29 DIAGNOSIS — U071 COVID-19: Secondary | ICD-10-CM | POA: Diagnosis not present

## 2024-09-29 MED ORDER — ONDANSETRON 4 MG PO TBDP: 4.0000 mg | ORAL_TABLET | Freq: Three times a day (TID) | ORAL | 0 refills | Status: AC | PRN

## 2024-09-29 MED ORDER — ONDANSETRON 4 MG PO TBDP
4.0000 mg | ORAL_TABLET | Freq: Once | ORAL | Status: AC
  Administered 2024-09-29: 4 mg via ORAL
  Filled 2024-09-29: qty 1

## 2024-09-29 MED ORDER — ACETAMINOPHEN 325 MG PO TABS
650.0000 mg | ORAL_TABLET | Freq: Once | ORAL | Status: AC
  Administered 2024-09-29: 650 mg via ORAL
  Filled 2024-09-29: qty 2

## 2024-09-29 NOTE — Discharge Instructions (Signed)
 Your symptoms this evening are consistent with your COVID-19 infection.  Please continue to drink fluids.  I have prescribed Zofran  which is a nausea medication which should help with your symptoms.  You may take Tylenol  to help with fever and body aches.  If you develop any life-threatening symptoms such as chest pain or shortness of breath, please return to the emergency department.

## 2024-09-29 NOTE — Telephone Encounter (Signed)
 Patient was advise on 12/08 -  by Dr. Kara

## 2024-09-29 NOTE — ED Provider Notes (Signed)
 Shannon Roberts   CSN: [id number] Arrival date & time: 09/28/24  2119     Patient presents with: Nausea   Shannon Roberts is a 73 y.o. female.  Patient with known COVID infection, history of hypertension, anxiety, COPD, stroke presents to the emergency department complaining of nausea and lightheadedness.  She was diagnosed with COVID on Sunday and started taking Paxlovid.  She states she feels worse at this time than before.  She denies emesis but endorses nausea, generalized weakness, and lightheadedness.   HPI     Prior to Admission medications   Medication Sig Start Date End Date Taking? Authorizing Provider  ondansetron  (ZOFRAN -ODT) 4 MG disintegrating tablet Take 1 tablet (4 mg total) by mouth every 8 (eight) hours as needed for nausea or vomiting. 09/29/24  Yes Logan Ubaldo NOVAK, PA-C  albuterol  (PROVENTIL ) (2.5 MG/3ML) 0.083% nebulizer solution Take 3 mLs (2.5 mg total) by nebulization every 6 (six) hours as needed for wheezing or shortness of breath. 07/01/24   Kara Dorn NOVAK, MD  amLODipine  (NORVASC ) 5 MG tablet Take 5 mg by mouth daily. 03/11/21   [provider]  atenolol  (TENORMIN ) 50 MG tablet Take 50 mg by mouth daily.    [provider]  Cholecalciferol (VITAMIN D3) 5000 units CAPS Take 5,000 Units by mouth every evening.     [provider]  clopidogrel  (PLAVIX ) 75 MG tablet Take 75 mg by mouth daily.     [provider]  DULoxetine  (CYMBALTA ) 30 MG capsule Take 60 mg by mouth daily at 12 noon. Patient take 2 tablets daily.    [provider]  Evolocumab  (REPATHA  SURECLICK) 140 MG/ML SOAJ Inject 140 mg into the skin every 14 (fourteen) days. INJECT UNDER THE SKIN EVERY 14 DAYS. 06/08/24   O'NealDarryle Ned, MD  ferrous sulfate 325 (65 FE) MG tablet Take 325 mg by mouth daily with breakfast.    [provider]  fluticasone  (FLONASE ) 50 MCG/ACT nasal  spray Place 1 spray into both nostrils daily.    [provider]  Fluticasone -Umeclidin-Vilant (TRELEGY ELLIPTA ) 200-62.5-25 MCG/ACT AEPB Inhale 1 puff into the lungs daily. 04/22/24   Hope Almarie ORN, NP  Ipratropium-Albuterol  (COMBIVENT  RESPIMAT) 20-100 MCG/ACT AERS respimat Inhale 1 puff into the lungs every 4 (four) hours as needed for wheezing or shortness of breath. 08/07/21   Kara Dorn NOVAK, MD  isosorbide  mononitrate (IMDUR ) 30 MG 24 hr tablet Take 30 mg by mouth in the morning and at bedtime. 09/24/14   [provider]  levothyroxine (SYNTHROID) 50 MCG tablet Take 50 mcg by mouth every morning.    [provider]  lisinopril  (PRINIVIL ,ZESTRIL ) 40 MG tablet Take 40 mg by mouth daily.    [provider]  LORazepam  (ATIVAN ) 0.5 MG tablet Take 0.5 mg by mouth See admin instructions. Take 0.5 mg by mouth at bedtime and an additional 0.5 mg once a day as needed for anxiety    [provider]  pantoprazole  (PROTONIX ) 40 MG tablet Take 40 mg by mouth in the morning and at bedtime.    [provider]  simvastatin  (ZOCOR ) 20 MG tablet Take 40 mg by mouth daily at 6 PM. 06/06/22   [provider]  traMADol  (ULTRAM ) 50 MG tablet Take 1 tablet (50 mg total) by mouth every 6 (six) hours as needed. 08/17/24   Horton, Charmaine FALCON, MD    Allergies: Carvedilol, Buspirone, Dapsone, Niacin and related, Paroxetine hcl,  Septra [sulfamethoxazole-trimethoprim], Sertraline hcl, Amoxicillin, Azithromycin , and Wellbutrin [bupropion]    Review of Systems  Updated Vital Signs BP (!) 163/83   Pulse 71   Temp 97.6 F (36.4 C) (Oral)   Resp 18   Ht 5' 5 (1.651 m)   Wt 88 kg   SpO2 100%   BMI 32.28 kg/m   Physical Exam Vitals and nursing Roberts reviewed.  Constitutional:      General: She is not in acute distress.    Appearance: She is well-developed.  HENT:     Head: Normocephalic and atraumatic.  Eyes:     Conjunctiva/sclera: Conjunctivae  normal.  Cardiovascular:     Rate and Rhythm: Normal rate and regular rhythm.  Pulmonary:     Effort: Pulmonary effort is normal. No respiratory distress.     Breath sounds: Normal breath sounds.  Abdominal:     Palpations: Abdomen is soft.     Tenderness: There is no abdominal tenderness.  Musculoskeletal:        General: No swelling.     Cervical back: Neck supple.  Skin:    General: Skin is warm and dry.     Capillary Refill: Capillary refill takes less than 2 seconds.  Neurological:     Mental Status: She is alert.     Gait: Gait normal.  Psychiatric:        Mood and Affect: Mood normal.     (all labs ordered are listed, but only abnormal results are displayed) Labs Reviewed  CBC WITH DIFFERENTIAL/PLATELET - Abnormal; Notable for the following components:      Result Value   Neutro Abs 9.2 (*)    Lymphs Abs 0.6 (*)    Abs Immature Granulocytes 0.11 (*)    All other components within normal limits  COMPREHENSIVE METABOLIC PANEL WITH GFR    EKG: EKG Interpretation Date/Time:  Tuesday September 28 2024 22:23:56 EST Ventricular Rate:  69 PR Interval:  180 QRS Duration:  157 QT Interval:  473 QTC Calculation: 507 R Axis:   -80  Text Interpretation: Sinus rhythm RBBB and LAFB Probable left ventricular hypertrophy Confirmed by Palumbo, April ([id number]) on 09/28/2024 11:10:41 PM  Radiology: No results found.   Procedures   Medications Ordered in the ED  ondansetron  (ZOFRAN -ODT) disintegrating tablet 4 mg (has no administration in time range)  acetaminophen  (TYLENOL ) tablet 650 mg (has no administration in time range)  ondansetron  (ZOFRAN -ODT) disintegrating tablet 4 mg (4 mg Oral Given 09/28/24 2220)                                    Medical Decision Making Amount and/or Complexity of Data Reviewed Labs: ordered.  Risk Prescription drug management.   This patient presents to the ED for concern of lightheadedness and nausea in the setting of COVID, this  involves an extensive number of treatment options, and is a complaint that carries with it a high risk of complications and morbidity.  The differential diagnosis includes effects of COVID, dehydration, others   Co morbidities / Chronic conditions that complicate the patient evaluation  As noted in HPI   Additional history obtained:  Additional history obtained from EMR   Lab Tests:  I Ordered, and personally interpreted labs.  The pertinent results include: Unremarkable CBC.  CMP hemolyzed twice.  As patient is tolerating oral intake will not perform further venipuncture at this time   Imaging Studies ordered:  Lungs are clear to auscultation with no wheezes, rales, rhonchi.  No indication for emergent chest x-ray   Cardiac Monitoring: / EKG:  The patient was maintained on a cardiac monitor.  I personally viewed and interpreted the cardiac monitored which showed an underlying rhythm of: Sinus rhythm   Problem List / ED Course / Critical interventions / Medication management   I ordered medication including Zofran  and Tylenol . Reevaluation of the patient after these medicines showed that the patient improved I have reviewed the patients home medicines and have made adjustments as needed   Social Determinants of Health:  Patient is a former smoker   Test / Admission - Considered:  Patient with symptoms consistent with COVID infection.  Zofran  has been prescribed to help with patient's nausea.  I have explained to patient that her vitals are reassuring and that her lungs are clear to auscultation.  She is tolerating oral intake after Zofran  administration.  Patient will need time for her body to continue to fight the infection.  She may take Zofran  at home to help with symptoms.  She should follow-up as needed with her primary care provider.  Return precautions were provided     Final diagnoses:  COVID-19  Nausea    ED Discharge Orders          Ordered     ondansetron  (ZOFRAN -ODT) 4 MG disintegrating tablet  Every 8 hours PRN        09/29/24 0106               Karigan Cloninger B, PA-C 09/29/24 0107    Palumbo, April, MD 09/29/24 0121

## 2024-09-30 ENCOUNTER — Telehealth: Payer: Self-pay | Admitting: Pulmonary Disease

## 2024-09-30 ENCOUNTER — Ambulatory Visit: Payer: Self-pay | Admitting: Pulmonary Disease

## 2024-09-30 DIAGNOSIS — R051 Acute cough: Secondary | ICD-10-CM

## 2024-09-30 MED ORDER — HYDROCODONE BIT-HOMATROP MBR 5-1.5 MG/5ML PO SOLN: 5.0000 mL | Freq: Four times a day (QID) | ORAL | 0 refills | Status: AC | PRN

## 2024-09-30 NOTE — Telephone Encounter (Signed)
 Covid positive, patient had a cough last night that concerned her--productive with clear phlegm, states shortness of breath worse than two months ago but unchanged since going to the ER two days ago---Patient wants a call back with pulm advice on further  FYI Only or Action Required?: Action required by provider: clinical question for provider and update on patient condition.  Patient is followed in Pulmonology for COPD, last seen on 08/11/2024 by Kara Dorn NOVAK, MD.  Called Nurse Triage reporting Cough.  Symptoms began several days ago.  Interventions attempted: Prescription medications: Paxlovid, Zofran , Rescue inhaler, Nebulizer treatments, and Increased fluids/rest.  Symptoms are: some symptoms better but patient states shortness of breath unchanged or worse than two months ago but unchanged since going to the ER two days ago.  Triage Disposition: See Physician Within 24 Hours  Patient/caregiver understands and will follow disposition?: Unsure           Copied from CRM #[id number]. Topic: Clinical - Red Word Triage >> Sep 30, 2024  8:14 AM Leila BROCKS wrote: Red Word that prompted transfer to Nurse Triage: Patient [phone] states spoke with Dr. Kara on Monday about having Covid last Sunday and talking Paxlovid, antibiotics, and nausea, patient has COPD. Patient has concerns and wants to speak with nurse. Patient states last night coughing phelgm and blowing mucous, oxygen is changing 98 to 96 range, shortness of breath, dizziness, hot and cold sensation, and weak. Patient denies pain, nor fever. Please advise. Reason for Disposition  [1] Known COPD or other severe lung disease (i.e., bronchiectasis, cystic fibrosis, lung surgery) AND [2] symptoms getting worse (i.e., increased sputum purulence or amount, increased breathing difficulty  Answer Assessment - Initial Assessment Questions Patient wants to be contacted back---Patient very back and forth about her symptoms but at  the end of conversation states that she doesn't know what to expect with COVID and she states her shortness of breath is worse than normal but her oxygen levels were 95-98% on room air. She states initially the cough last night is what concerned her and then the shortness of breath. Patient would like her PCP's advice on her situation at this time  Patient is advised to call us  back if anything changes or with any further questions/concerns. Patient is advised that if anything worsens to go to the Emergency Room or call 911. Patient verbalized understanding.    Patient states her cough is better and she hasn't coughed since 7am today  Patient diagnosed with COVID on 09/26/2024 Tuesday night patient went to the hospital 09/28/2024  95% was the lowest her oxygen got last night Oxygen levels bounced around 95-98% on room air  Patient states she coughed all night Patient states she is not any better  Patient is taking Paxlovid and states she missed two doses  Combivent  Respimat--Inhale 1 puff into the lungs every 4 (four) hours as needed for wheezing or shortness of breath.  Trelegy Ellipta ---Inhale 1 puff into the lungs daily  Oxygen 98% on room with a pulse 68  Nausea has gotten better & she states it is tolerable due to nausea medication from the ER 97.5 F temp this morning around 7am  Patient states she had upper back pain for 15 minutes but that does not hurt anymore  Patient states that her productive cough is new ---coughing up clear mucous most of the night  Patient states that her coughing was bad last night but it was off and on       E2C2 Pulmonary  Triage - Initial Assessment Questions Chief Complaint (e.g., cough, sob, wheezing, fever, chills, sweat or additional symptoms) *Go to specific symptom protocol after initial questions. Cough  Have you tested for COVID or Flu? Note: If not, ask patient if a home test can be taken. If so, instruct patient to call back  for positive results. Yes---diagnosed 09/26/2024 with COVID  MEDICINES:   Have you used any OTC meds to help with symptoms? No If yes, ask What medications? N/a   If inhaler, ask How many puffs and how often? Note: Review instructions on medication in the chart. Combivent  Respimat--Inhale 1 puff into the lungs every 4 (four) hours as needed for wheezing or shortness of breath.  Trelegy Ellipta ---Inhale 1 puff into the lungs daily  OXYGEN: Do you wear supplemental oxygen? Yes If yes, How many liters are you supposed to use?   Do you monitor your oxygen levels? Yes If yes, What is your reading (oxygen level) today? Oxygen 98% on room with a pulse 68 during triage  What is your usual oxygen saturation reading?  (Note: Pulmonary O2 sats should be 90% or greater) 98% on room air    3. SPUTUM: Describe the color of your sputum (e.g., none, dry cough; clear, white, yellow, green)     clear 5. DIFFICULTY BREATHING: Are you having difficulty breathing? If Yes, ask: How bad is it? (e.g., mild, moderate, severe)      Patient  6. FEVER: Do you have a fever? If Yes, ask: What is your temperature, how was it measured, and when did it start?     Not known 8. LUNG HISTORY: Do you have any history of lung disease?  (e.g., pulmonary embolus, asthma, emphysema)     COPD  Protocols used: Cough - Acute Productive-A-AH

## 2024-09-30 NOTE — Telephone Encounter (Signed)
 Will send in hycodan cough syrup for her to take a bedtime to reduce cough symptoms to help with getting better sleep. There is narcotic in this cough syrup which helps reduce the cough reflex and can cause some drowsiness to help with sleep.  Do not take tramadol  when taking the hycodan cough syrup.  Hope she feels better soon.  Dorn Chill, MD [facility] Office: [phone]   See Amion for personal pager PCCM on call pager [phone] until 7pm. Please call Elink 7p-7a. [phone]

## 2024-09-30 NOTE — Telephone Encounter (Signed)
 Spoke with patient , VBU

## 2024-09-30 NOTE — Telephone Encounter (Signed)
° °  FYI Only or Action Required?: Action required by provider: clinical question for provider. Can patient take hycodan and ativan  together  Patient is followed in Pulmonology for COPC, last seen on 08/11/2024 by Kara Dorn NOVAK, MD.  Called Nurse Triage reporting Medication Consultation.    Triage Disposition: Call PCP When Office is Open  Patient/caregiver understands and will follow disposition?: Yes Copied from CRM #[id number]. Topic: Clinical - Medical Advice >> Sep 30, 2024  5:26 PM Joesph PARAS wrote: Reason for CRM: Patient is calling with question about what medicine she can and cannot take with her new tylenol  codeine  rx. Reason for Disposition  [1] Caller has NON-URGENT medicine question about med that PCP prescribed AND [2] triager unable to answer question  Answer Assessment - Initial Assessment Questions 1. NAME of MEDICINE: What medicine(s) are you calling about?     Hycodan and Ativan  2. QUESTION: What is your question? (e.g., double dose of medicine, side effect)     Patient was cautioned by pharmacist not to take these medications together 3. PRESCRIBER: Who prescribed the medicine? Reason: if prescribed by specialist, call should be referred to that group.     [facility]   Patient will take Hycodan today, and will hold off on taking Ativan  until Dr. Kara confirms that it is safe to take these medications together.  Protocols used: Medication Question Call-A-AH

## 2024-09-30 NOTE — Telephone Encounter (Signed)
 Dr. Kara, Patient Covid positive 12/7, went to ED on 12/9.  Coughing all night, oxygen levels are 95-98%, Please advise.  Thank you.

## 2024-10-01 ENCOUNTER — Telehealth: Payer: Self-pay | Admitting: Cardiovascular Disease

## 2024-10-01 NOTE — Telephone Encounter (Signed)
 Shannon Roberts, Patient has already been cautioned not to take the Hycodan and Ativan  together by the pharmacist, however, she called to get confirmation with Dr. Kara.  Dr. Kara is not available today.  Please advise.  Thank you.

## 2024-10-01 NOTE — Telephone Encounter (Signed)
 Agree do not take sedating medications together, can magnify the potential side effects.

## 2024-10-01 NOTE — Telephone Encounter (Addendum)
 Pt was advised not to take Ativan  and Hycodan together and also advised me she is having issues with low BP and is going to contact Cardiology now about this.

## 2024-10-01 NOTE — Telephone Encounter (Signed)
 Pt c/o BP issue: STAT if pt c/o blurred vision, one-sided weakness or slurred speech  1. What are your last 5 BP readings? 111/56 for about an hr; 114/48; 111/58; 130/62  2. Are you having any other symptoms (ex. Dizziness, headache, blurred vision, passed out)? Cold   3. What is your BP issue?

## 2024-10-01 NOTE — Telephone Encounter (Signed)
 Pt c/o BP issue: STAT if pt c/o blurred vision, one-sided weakness or slurred speech.  STAT if BP is GREATER than 180/120 TODAY.  STAT if BP is LESS than 90/60 and SYMPTOMATIC TODAY  1. What is your BP concern?   See below  2. Have you taken any BP medication today?  Yes  3. What are your last 5 BP readings?  96/45  4. Are you having any other symptoms (ex. Dizziness, headache, blurred vision, passed out)?   Headache when standing   Patient called again stating her BP has been 96/45 when she lays down.

## 2024-10-01 NOTE — Telephone Encounter (Signed)
 Spoke to patient she stated she was diagnosed with covid this past Sunday.Stated she was prescribed antibiotics and prednisone .Stated her B/P is up and down.Stated B/P when she stands is high 180 systolic.Laying down is low 96/46.She is concerned about B/P.Stated she is trying to drink fluids.She is not able to eat.Stated she is not feeling any better after taking almost 1 week of antibiotics and prednisone .Stated she feels awful.She is sob.Advised she needs to go to ED.She will press her life alert and go to ED.I will make Dr.O'Neal aware.

## 2024-10-01 NOTE — Telephone Encounter (Signed)
 Pharmacy advise not to take the hycodan and lorazepam  together and I suggested to follow what the pharmacy told her.    Patient had stated that the hycodan makes her feel awful and I said I will see if  the DOD or Dr.Dewald will send something else in. then she states what the pharmacy told her, she is not being very clear on what she would like done.   But wants Dr. Kara to know.

## 2024-10-01 NOTE — Telephone Encounter (Signed)
 Pt called stating she'd like to be called back on her cell and home phone number because she's unsure of when her phone will die. She stated she's very worried and concerned so she'd like a callback as soon as possible. Please advise

## 2024-10-01 NOTE — Telephone Encounter (Signed)
 Patient already updated by staff regarding not taking cough syrup with benzodiazepine.   If she does not want to take cough syrup due to side effects that is ok.  JD

## 2024-10-01 NOTE — Telephone Encounter (Signed)
 Pt was diagnosed with Covid on Sunday. She went to the ER on Tuesday night due to dizziness, but she was d/c'd. She called the EMS to her house this morning and they checked her out- her BP was fine, lungs were clear, oxygen normal.   She reports that usually her BP is high, but starting today her BP has gotten lower, especially when lying down. She has not passed out, she reports some dizziness when she stands up. She says that she has been drinking plenty of fluids and eating as well as she can. She does not report any pain or difficulty breathing.   Recommended that she stay well hydrated and to move slowly when making position changes. Informed her that if her top number goes below 100, then let us  know.  Given ER precautions. She verbalized understanding.

## 2024-10-03 ENCOUNTER — Emergency Department (HOSPITAL_COMMUNITY)

## 2024-10-03 ENCOUNTER — Inpatient Hospital Stay (HOSPITAL_COMMUNITY)
Admission: EM | Admit: 2024-10-03 | Discharge: 2024-10-08 | DRG: 644 | Disposition: A | Discharge status: Home / Self Care | Attending: Internal Medicine | Admitting: Internal Medicine

## 2024-10-03 ENCOUNTER — Encounter (HOSPITAL_COMMUNITY): Payer: Self-pay | Admitting: Internal Medicine

## 2024-10-03 ENCOUNTER — Other Ambulatory Visit: Payer: Self-pay

## 2024-10-03 DIAGNOSIS — Z87891 Personal history of nicotine dependence: Secondary | ICD-10-CM

## 2024-10-03 DIAGNOSIS — R262 Difficulty in walking, not elsewhere classified: Secondary | ICD-10-CM | POA: Diagnosis present

## 2024-10-03 DIAGNOSIS — Z801 Family history of malignant neoplasm of trachea, bronchus and lung: Secondary | ICD-10-CM

## 2024-10-03 DIAGNOSIS — I4510 Unspecified right bundle-branch block: Secondary | ICD-10-CM | POA: Diagnosis not present

## 2024-10-03 DIAGNOSIS — Z9049 Acquired absence of other specified parts of digestive tract: Secondary | ICD-10-CM

## 2024-10-03 DIAGNOSIS — I739 Peripheral vascular disease, unspecified: Secondary | ICD-10-CM | POA: Diagnosis present

## 2024-10-03 DIAGNOSIS — Z860101 Personal history of adenomatous and serrated colon polyps: Secondary | ICD-10-CM

## 2024-10-03 DIAGNOSIS — E039 Hypothyroidism, unspecified: Secondary | ICD-10-CM | POA: Diagnosis present

## 2024-10-03 DIAGNOSIS — R7303 Prediabetes: Secondary | ICD-10-CM | POA: Diagnosis present

## 2024-10-03 DIAGNOSIS — Z833 Family history of diabetes mellitus: Secondary | ICD-10-CM

## 2024-10-03 DIAGNOSIS — E785 Hyperlipidemia, unspecified: Secondary | ICD-10-CM | POA: Diagnosis present

## 2024-10-03 DIAGNOSIS — F32A Depression, unspecified: Secondary | ICD-10-CM | POA: Diagnosis present

## 2024-10-03 DIAGNOSIS — Z881 Allergy status to other antibiotic agents status: Secondary | ICD-10-CM

## 2024-10-03 DIAGNOSIS — Z6831 Body mass index (BMI) 31.0-31.9, adult: Secondary | ICD-10-CM

## 2024-10-03 DIAGNOSIS — I129 Hypertensive chronic kidney disease with stage 1 through stage 4 chronic kidney disease, or unspecified chronic kidney disease: Secondary | ICD-10-CM | POA: Diagnosis present

## 2024-10-03 DIAGNOSIS — Z8041 Family history of malignant neoplasm of ovary: Secondary | ICD-10-CM

## 2024-10-03 DIAGNOSIS — E871 Hypo-osmolality and hyponatremia: Principal | ICD-10-CM | POA: Diagnosis present

## 2024-10-03 DIAGNOSIS — Z803 Family history of malignant neoplasm of breast: Secondary | ICD-10-CM

## 2024-10-03 DIAGNOSIS — Z79899 Other long term (current) drug therapy: Secondary | ICD-10-CM

## 2024-10-03 DIAGNOSIS — Z885 Allergy status to narcotic agent status: Secondary | ICD-10-CM

## 2024-10-03 DIAGNOSIS — N1831 Chronic kidney disease, stage 3a: Secondary | ICD-10-CM | POA: Diagnosis present

## 2024-10-03 DIAGNOSIS — Z880 Allergy status to penicillin: Secondary | ICD-10-CM

## 2024-10-03 DIAGNOSIS — K219 Gastro-esophageal reflux disease without esophagitis: Secondary | ICD-10-CM | POA: Diagnosis present

## 2024-10-03 DIAGNOSIS — E222 Syndrome of inappropriate secretion of antidiuretic hormone: Principal | ICD-10-CM | POA: Diagnosis present

## 2024-10-03 DIAGNOSIS — Z800 Family history of malignant neoplasm of digestive organs: Secondary | ICD-10-CM

## 2024-10-03 DIAGNOSIS — Z8673 Personal history of transient ischemic attack (TIA), and cerebral infarction without residual deficits: Secondary | ICD-10-CM

## 2024-10-03 DIAGNOSIS — R079 Chest pain, unspecified: Secondary | ICD-10-CM | POA: Diagnosis not present

## 2024-10-03 DIAGNOSIS — Z888 Allergy status to other drugs, medicaments and biological substances status: Secondary | ICD-10-CM

## 2024-10-03 DIAGNOSIS — R0602 Shortness of breath: Secondary | ICD-10-CM

## 2024-10-03 DIAGNOSIS — J449 Chronic obstructive pulmonary disease, unspecified: Secondary | ICD-10-CM | POA: Diagnosis present

## 2024-10-03 DIAGNOSIS — Z825 Family history of asthma and other chronic lower respiratory diseases: Secondary | ICD-10-CM

## 2024-10-03 DIAGNOSIS — J441 Chronic obstructive pulmonary disease with (acute) exacerbation: Secondary | ICD-10-CM | POA: Diagnosis present

## 2024-10-03 DIAGNOSIS — I2510 Atherosclerotic heart disease of native coronary artery without angina pectoris: Secondary | ICD-10-CM | POA: Diagnosis present

## 2024-10-03 DIAGNOSIS — Z7902 Long term (current) use of antithrombotics/antiplatelets: Secondary | ICD-10-CM

## 2024-10-03 DIAGNOSIS — I10 Essential (primary) hypertension: Secondary | ICD-10-CM | POA: Diagnosis present

## 2024-10-03 DIAGNOSIS — E869 Volume depletion, unspecified: Secondary | ICD-10-CM | POA: Diagnosis present

## 2024-10-03 DIAGNOSIS — Z8616 Personal history of COVID-19: Secondary | ICD-10-CM

## 2024-10-03 DIAGNOSIS — F419 Anxiety disorder, unspecified: Secondary | ICD-10-CM | POA: Diagnosis present

## 2024-10-03 DIAGNOSIS — Z79890 Hormone replacement therapy (postmenopausal): Secondary | ICD-10-CM

## 2024-10-03 DIAGNOSIS — E8770 Fluid overload, unspecified: Secondary | ICD-10-CM | POA: Diagnosis present

## 2024-10-03 DIAGNOSIS — Z8249 Family history of ischemic heart disease and other diseases of the circulatory system: Secondary | ICD-10-CM

## 2024-10-03 DIAGNOSIS — E66811 Obesity, class 1: Secondary | ICD-10-CM | POA: Diagnosis present

## 2024-10-03 LAB — I-STAT CHEM 8, ED
BUN: 20 mg/dL (ref 8–23)
Calcium, Ion: 1.19 mmol/L (ref 1.15–1.40)
Chloride: 84 mmol/L — ABNORMAL LOW (ref 98–111)
Creatinine, Ser: 1.3 mg/dL — ABNORMAL HIGH (ref 0.44–1.00)
Glucose, Bld: 118 mg/dL — ABNORMAL HIGH (ref 70–99)
HCT: 35 % — ABNORMAL LOW (ref 36.0–46.0)
Hemoglobin: 11.9 g/dL — ABNORMAL LOW (ref 12.0–15.0)
Potassium: 4.3 mmol/L (ref 3.5–5.1)
Sodium: 118 mmol/L — CL (ref 135–145)
TCO2: 22 mmol/L (ref 22–32)

## 2024-10-03 LAB — BASIC METABOLIC PANEL WITH GFR
Anion gap: 13 (ref 5–15)
Anion gap: 10 (ref 5–15)
BUN: 20 mg/dL (ref 8–23)
BUN: 18 mg/dL (ref 8–23)
CO2: 22 mmol/L (ref 22–32)
CO2: 21 mmol/L — ABNORMAL LOW (ref 22–32)
Calcium: 10 mg/dL (ref 8.9–10.3)
Calcium: 9.4 mg/dL (ref 8.9–10.3)
Chloride: 83 mmol/L — ABNORMAL LOW (ref 98–111)
Chloride: 87 mmol/L — ABNORMAL LOW (ref 98–111)
Creatinine, Ser: 1.31 mg/dL — ABNORMAL HIGH (ref 0.44–1.00)
Creatinine, Ser: 1.15 mg/dL — ABNORMAL HIGH (ref 0.44–1.00)
GFR, Estimated: 43 mL/min — ABNORMAL LOW (ref >60)
GFR, Estimated: 50 mL/min — ABNORMAL LOW (ref >60)
Glucose, Bld: 113 mg/dL — ABNORMAL HIGH (ref 70–99)
Glucose, Bld: 136 mg/dL — ABNORMAL HIGH (ref 70–99)
Potassium: 4.5 mmol/L (ref 3.5–5.1)
Potassium: 4.3 mmol/L (ref 3.5–5.1)
Sodium: 118 mmol/L — CL (ref 135–145)
Sodium: 118 mmol/L — CL (ref 135–145)

## 2024-10-03 LAB — URINALYSIS, ROUTINE W REFLEX MICROSCOPIC
Bilirubin Urine: NEGATIVE
Glucose, UA: NEGATIVE mg/dL
Hgb urine dipstick: NEGATIVE
Ketones, ur: NEGATIVE mg/dL
Leukocytes,Ua: NEGATIVE
Nitrite: NEGATIVE
Protein, ur: NEGATIVE mg/dL
Specific Gravity, Urine: 1.01 (ref 1.005–1.030)
pH: 6 (ref 5.0–8.0)

## 2024-10-03 LAB — CBC
HCT: 34.8 % — ABNORMAL LOW (ref 36.0–46.0)
Hemoglobin: 12.8 g/dL (ref 12.0–15.0)
MCH: 31.8 pg (ref 26.0–34.0)
MCHC: 36.8 g/dL — ABNORMAL HIGH (ref 30.0–36.0)
MCV: 86.4 fL (ref 80.0–100.0)
Platelets: 418 K/uL — ABNORMAL HIGH (ref 150–400)
RBC: 4.03 MIL/uL (ref 3.87–5.11)
RDW: 12 % (ref 11.5–15.5)
WBC: 14.9 K/uL — ABNORMAL HIGH (ref 4.0–10.5)
nRBC: 0 % (ref 0.0–0.2)

## 2024-10-03 LAB — SODIUM, URINE, RANDOM: Sodium, Ur: <30 mmol/L

## 2024-10-03 LAB — PRO BRAIN NATRIURETIC PEPTIDE: Pro Brain Natriuretic Peptide: 532 pg/mL — ABNORMAL HIGH (ref <300.0)

## 2024-10-03 LAB — D-DIMER, QUANTITATIVE: D-Dimer, Quant: 0.51 ug{FEU}/mL — ABNORMAL HIGH (ref 0.00–0.50)

## 2024-10-03 MED ORDER — SIMVASTATIN 40 MG PO TABS
40.0000 mg | ORAL_TABLET | Freq: Every day | ORAL | Status: DC
  Administered 2024-10-04: 18:00:00 40 mg via ORAL
  Filled 2024-10-03: qty 1

## 2024-10-03 MED ORDER — ONDANSETRON HCL 4 MG/2ML IJ SOLN
4.0000 mg | Freq: Four times a day (QID) | INTRAMUSCULAR | Status: DC | PRN
  Administered 2024-10-06: 01:00:00 4 mg via INTRAVENOUS
  Filled 2024-10-03: qty 2

## 2024-10-03 MED ORDER — ENOXAPARIN SODIUM 40 MG/0.4ML IJ SOSY
40.0000 mg | PREFILLED_SYRINGE | INTRAMUSCULAR | Status: DC
  Administered 2024-10-03 – 2024-10-07 (×5): 40 mg via SUBCUTANEOUS
  Filled 2024-10-03 (×5): qty 0.4

## 2024-10-03 MED ORDER — SODIUM CHLORIDE 0.9 % IV SOLN: Freq: Once | INTRAVENOUS | Status: AC

## 2024-10-03 MED ORDER — HYDROCODONE BIT-HOMATROP MBR 5-1.5 MG/5ML PO SOLN
5.0000 mL | Freq: Four times a day (QID) | ORAL | Status: DC | PRN
  Administered 2024-10-03 – 2024-10-07 (×2): 5 mL via ORAL
  Filled 2024-10-03 (×2): qty 5

## 2024-10-03 MED ORDER — ACETAMINOPHEN 650 MG RE SUPP: 650.0000 mg | Freq: Four times a day (QID) | RECTAL | Status: DC | PRN

## 2024-10-03 MED ORDER — ALBUTEROL SULFATE (2.5 MG/3ML) 0.083% IN NEBU
2.5000 mg | INHALATION_SOLUTION | Freq: Four times a day (QID) | RESPIRATORY_TRACT | Status: DC | PRN
  Administered 2024-10-05 – 2024-10-07 (×5): 2.5 mg via RESPIRATORY_TRACT
  Filled 2024-10-03 (×6): qty 3

## 2024-10-03 MED ORDER — ACETAMINOPHEN 325 MG PO TABS
650.0000 mg | ORAL_TABLET | Freq: Four times a day (QID) | ORAL | Status: DC | PRN
  Administered 2024-10-05 – 2024-10-06 (×3): 650 mg via ORAL
  Filled 2024-10-03 (×3): qty 2

## 2024-10-03 MED ORDER — PANTOPRAZOLE SODIUM 40 MG PO TBEC
40.0000 mg | DELAYED_RELEASE_TABLET | Freq: Two times a day (BID) | ORAL | Status: DC
  Administered 2024-10-03 – 2024-10-08 (×10): 40 mg via ORAL
  Filled 2024-10-03 (×10): qty 1

## 2024-10-03 MED ORDER — SODIUM CHLORIDE 0.9 % IV SOLN: INTRAVENOUS | Status: DC

## 2024-10-03 MED ORDER — ONDANSETRON HCL 4 MG PO TABS
4.0000 mg | ORAL_TABLET | Freq: Four times a day (QID) | ORAL | Status: DC | PRN
  Administered 2024-10-04: 12:00:00 4 mg via ORAL
  Filled 2024-10-03: qty 1

## 2024-10-03 MED ORDER — LORAZEPAM 2 MG/ML IJ SOLN
0.5000 mg | Freq: Once | INTRAMUSCULAR | Status: AC | PRN
  Administered 2024-10-03: 0.5 mg via INTRAVENOUS
  Filled 2024-10-03: qty 1

## 2024-10-03 MED ORDER — CHLORHEXIDINE GLUCONATE CLOTH 2 % EX PADS
6.0000 | MEDICATED_PAD | Freq: Every day | CUTANEOUS | Status: DC
  Administered 2024-10-03 – 2024-10-04 (×2): 6 via TOPICAL

## 2024-10-03 NOTE — H&P (Signed)
 History and Physical    Patient: Shannon Roberts FMW:[id number] DOB: 06-30-1951 DOA: 10/03/2024 DOS: the patient was seen and examined on 10/03/2024 PCP: Okey Carlin Redbird, MD  Patient coming from: Home  Chief Complaint:  Chief Complaint  Patient presents with   Shortness of Breath   HPI: Shannon Roberts is a 73 y.o. female with medical history significant of allergic rhinitis, normocytic anemia, anxiety, depression, colitis, diverticulosis, female bladder prolapse, fibroid, GERD, heart murmur, adenomatous colon polyps, hyperlipidemia, hypertension, IBS, insomnia abscess of the lip, mitral valve prolapse, peripheral vascular disease, diet-controlled prediabetes, right bundle branch block, sciatica, dyspnea, history of CVA, COPD who presented to the emergency department with complaint of dyspnea after being diagnosed with COVID-19 earlier this week. She has had a course of Paxlovid.  She has also felt very weak. She has been drinking close to 3 L of water  a day since then. No hemoptysis. No chest pain, palpitations, diaphoresis, PND, orthopnea or pitting edema of the lower extremities.  No abdominal pain,  constipation, melena or hematochezia.  No flank pain, dysuria, frequency or hematuria.  No polyuria, polydipsia, polyphagia or blurred vision.   Lab work: Urinalysis was normal urine sodium was less than 30 mmol/L.  CBC showed white count 14.9, hemoglobin 12.8 g/dL platelets 581.  D-dimer 0.51.  proBNP 532.0.  BMP shows sodium 118, potassium 4.5, chloride 83 and CO2 22 mmol/L.  Glucose 113, BUN 20, creatinine 1.31 and calcium  10.0 mg/dL.  Imaging: Portable 1 view chest radiograph with no active disease.  ED course: Initial vital signs were temperature 97.5 F, pulse 68, respirations 16, BP 128/66 mmHg O2 sat 100% on room air.  The patient received 500 mL of normal saline bolus.   Review of Systems: As mentioned in the history of present illness. All other systems reviewed and are  negative. Past Medical History:  Diagnosis Date   Allergic rhinitis    Anemia    per hosp. - recently- pt. told that she is anemic   Anxiety    Colitis    COPD (chronic obstructive pulmonary disease) (HCC)    no o2   Depression    Diverticulosis    Female bladder prolapse    Fibroids    GERD (gastroesophageal reflux disease)    Heart murmur    Hx of adenomatous colonic polyps 08/26/2019   Hyperlipemia    Hypertension    IBS (irritable bowel syndrome)    Insomnia    Lip abscess 04/05/2016   Mitral valve prolapse    Peripheral vascular disease    Pre-diabetes 08/03/2019   Diet controlled     Right bundle branch block    Sciatic nerve disease    Shortness of breath    Stroke [facility]) March 2015   Past Surgical History:  Procedure Laterality Date   CAROTID ENDARTERECTOMY Right January 17, 2014   CE   CHOLECYSTECTOMY     COLONOSCOPY     ENDARTERECTOMY Right 01/17/2014   Procedure: RIGHT CAROTID ARTERY ENDARTERECTOMY WITH DACRON PATCH ANGIOPLASTY;  Surgeon: Krystal JULIANNA Doing, MD;  Location: [facility] OR;  Service: Vascular;  Laterality: Right;   UPPER GASTROINTESTINAL ENDOSCOPY     Social History:  reports that she quit smoking about 10 years ago. Her smoking use included cigarettes. She started smoking about 57 years ago. She has a 70.6 pack-year smoking history. She has never used smokeless tobacco. She reports that she does not drink alcohol and does not use drugs.  Allergies[1]  Family History  Problem Relation Age of Onset   Breast cancer Mother    Ovarian cancer Mother    AAA (abdominal aortic aneurysm) Mother    Emphysema Mother    Lung cancer Father    Emphysema Paternal Aunt    Pancreatic cancer Maternal Grandmother    Diabetes Maternal Aunt    Breast cancer Maternal Aunt    Heart disease Paternal Grandfather    Diabetes Paternal Grandfather    Kidney disease Maternal Aunt    Colon cancer Paternal Aunt    Colon polyps Neg Hx    Esophageal cancer Neg Hx    Rectal cancer  Neg Hx    Stomach cancer Neg Hx     Prior to Admission medications  Medication Sig Start Date End Date Taking? Authorizing Provider  albuterol  (PROVENTIL ) (2.5 MG/3ML) 0.083% nebulizer solution Take 3 mLs (2.5 mg total) by nebulization every 6 (six) hours as needed for wheezing or shortness of breath. 07/01/24   Kara Dorn NOVAK, MD  amLODipine  (NORVASC ) 5 MG tablet Take 5 mg by mouth daily. 03/11/21   [provider]  atenolol  (TENORMIN ) 50 MG tablet Take 50 mg by mouth daily.    [provider]  Cholecalciferol (VITAMIN D3) 5000 units CAPS Take 5,000 Units by mouth every evening.     [provider]  clopidogrel  (PLAVIX ) 75 MG tablet Take 75 mg by mouth daily.     [provider]  DULoxetine  (CYMBALTA ) 30 MG capsule Take 60 mg by mouth daily at 12 noon. Patient take 2 tablets daily.    [provider]  Evolocumab  (REPATHA  SURECLICK) 140 MG/ML SOAJ Inject 140 mg into the skin every 14 (fourteen) days. INJECT UNDER THE SKIN EVERY 14 DAYS. 06/08/24   O'NealDarryle Ned, MD  ferrous sulfate  325 (65 FE) MG tablet Take 325 mg by mouth daily with breakfast.    [provider]  fluticasone  (FLONASE ) 50 MCG/ACT nasal spray Place 1 spray into both nostrils daily.    [provider]  Fluticasone -Umeclidin-Vilant (TRELEGY ELLIPTA ) 200-62.5-25 MCG/ACT AEPB Inhale 1 puff into the lungs daily. 04/22/24   Hope Almarie ORN, NP  HYDROcodone  bit-homatropine (HYCODAN) 5-1.5 MG/5ML syrup Take 5 mLs by mouth every 6 (six) hours as needed for cough. 09/30/24   Kara Dorn NOVAK, MD  Ipratropium-Albuterol  (COMBIVENT  RESPIMAT) 20-100 MCG/ACT AERS respimat Inhale 1 puff into the lungs every 4 (four) hours as needed for wheezing or shortness of breath. 08/07/21   Kara Dorn NOVAK, MD  isosorbide  mononitrate (IMDUR ) 30 MG 24 hr tablet Take 30 mg by mouth in the morning and at bedtime. 09/24/14   [provider]  levothyroxine  (SYNTHROID ) 50 MCG  tablet Take 50 mcg by mouth every morning.    [provider]  lisinopril  (PRINIVIL ,ZESTRIL ) 40 MG tablet Take 40 mg by mouth daily.    [provider]  LORazepam  (ATIVAN ) 0.5 MG tablet Take 0.5 mg by mouth See admin instructions. Take 0.5 mg by mouth at bedtime and an additional 0.5 mg once a day as needed for anxiety    [provider]  ondansetron  (ZOFRAN -ODT) 4 MG disintegrating tablet Take 1 tablet (4 mg total) by mouth every 8 (eight) hours as needed for nausea or vomiting. 09/29/24   Logan Ubaldo NOVAK, PA-C  pantoprazole  (PROTONIX ) 40 MG tablet Take 40 mg by mouth in the morning and at bedtime.    [provider]  simvastatin  (ZOCOR ) 20 MG tablet Take 40 mg by mouth daily at 6 PM.  06/06/22   [provider]  traMADol  (ULTRAM ) 50 MG tablet Take 1 tablet (50 mg total) by mouth every 6 (six) hours as needed. 08/17/24   Bari Charmaine FALCON, MD    Physical Exam: Vitals:   10/03/24 1229 10/03/24 1251 10/03/24 1532 10/03/24 1600  BP: 128/66  (!) 137/57 122/65  Pulse: 68  62 64  Resp: 16  17 17   Temp: (!) 97.5 F (36.4 C)  97.7 F (36.5 C)   TempSrc: Oral  Oral   SpO2: 100%  100% 99%  Weight: 86.2 kg 86.2 kg    Height: 5' 5 (1.651 m) 5' 5 (1.651 m)     Physical Exam Vitals and nursing note reviewed.  Constitutional:      General: She is awake. She is not in acute distress.    Appearance: She is ill-appearing.  HENT:     Head: Normocephalic.     Nose: No rhinorrhea.     Mouth/Throat:     Mouth: Mucous membranes are moist.  Eyes:     General: No scleral icterus.    Pupils: Pupils are equal, round, and reactive to light.  Neck:     Vascular: No JVD.  Cardiovascular:     Rate and Rhythm: Normal rate and regular rhythm.     Heart sounds: S1 normal and S2 normal.  Pulmonary:     Breath sounds: No wheezing, rhonchi or rales.  Abdominal:     General: Bowel sounds are normal. There is no distension.     Palpations: Abdomen is soft.      Tenderness: There is no abdominal tenderness. There is no right CVA tenderness or left CVA tenderness.  Musculoskeletal:     Cervical back: Neck supple.     Right lower leg: No edema.     Left lower leg: No edema.  Skin:    General: Skin is warm and dry.  Neurological:     General: No focal deficit present.     Mental Status: She is alert and oriented to person, place, and time.  Psychiatric:        Mood and Affect: Mood normal.        Behavior: Behavior normal. Behavior is cooperative.     Data Reviewed:  Results are pending, will review when available.  EKG: Vent. rate 67 BPM PR interval 193 ms QRS duration 156 ms QT/QTcB 472/499 ms P-R-T axes 42 -85 74 Sinus rhythm RBBB and LAFB Probable left ventricular hypertrophy  Assessment and Plan: Principal Problem:   Hyponatremia Observation/stepdown. Free water  restriction. Discussed at length with the patient.. Normal saline infusion. Check BMP every 4 hours x2 Replace other electrolytes as needed. Will follow sodium level in AM.  Active Problems:   GERD (gastroesophageal reflux disease) Continue home PPI.    COPD, severe (HCC) Continue albuterol  nebs as needed.    HTN (hypertension), malignant Will resume antihypertensives in a.m. after med rec.    Hypothyroidism Will order levothyroxine  pending med rec.    Hyperlipidemia Continue simvastatin  40 mg p.o. daily.    Pre-diabetes Diet controlled. Glucose in the low 100s.    Advance Care Planning:   Code Status: Full Code   Consults:   Family Communication:   Severity of Illness: The appropriate patient status for this patient is OBSERVATION. Observation status is judged to be reasonable and necessary in order to provide the required intensity of service to ensure the patient's safety. The patient's presenting symptoms, physical exam findings, and initial radiographic and  laboratory data in the context of their medical condition is felt to place them at  decreased risk for further clinical deterioration. Furthermore, it is anticipated that the patient will be medically stable for discharge from the hospital within 2 midnights of admission.   Author: Alm Dorn Castor, MD 10/03/2024 5:27 PM  For on call review [email or web address].   This document was prepared using Dragon voice recognition software and may contain some unintended transcription errors.     [1]  Allergies Allergen Reactions   Carvedilol Palpitations    Other reaction(s): palpitations   Buspirone Other (See Comments)    Reaction not recalled Other reaction(s): did not work   Dapsone Other (See Comments)    Flushing  Other reaction(s): flushed   Niacin And Related Other (See Comments)    Flushing    Paroxetine Hcl     Other reaction(s): dizziness, dreams   Septra [Sulfamethoxazole-Trimethoprim] Diarrhea and Nausea And Vomiting   Sertraline Hcl     Other reaction(s): skin crawling fuzzy head   Amoxicillin Rash and Other (See Comments)    Has patient had a PCN reaction causing immediate rash, facial/tongue/throat swelling, SOB or lightheadedness with hypotension: No Has patient had a PCN reaction causing severe rash involving mucus membranes or skin necrosis: No Has patient had a PCN reaction that required hospitalization No Has patient had a PCN reaction occurring within the last 10 years: No If all of the above answers are NO, then may proceed with Cephalosporin use. Other reaction(s): FLUSHED FEELING   Azithromycin  Other (See Comments)    Pt states she can not take this due to her heart medication and heart condition.    Wellbutrin [Bupropion] Anxiety and Other (See Comments)    Caused the patient to feel jittery

## 2024-10-03 NOTE — ED Notes (Signed)
 US  PIV placed in R forearm, 20g 1.88

## 2024-10-03 NOTE — ED Provider Notes (Signed)
 [facility] EM[facility] DEPARTMENT AT [facility] Provider Note   CSN: [id number] Arrival date & time: 10/03/24  1214     Patient presents with: Shortness of Breath   Shannon Roberts is a 73 y.o. female.   HPI Pt BIB by EMS and coming from home. Patient was dx with COVID about a week ago.  She has been on Paxlovid and finished her dose yesterday.  Today reports chest pain on inspiration and shortness of breath d/t pain.  EMS heard some wheezing on her upper left lobe and gave her a duoneb enroute.   HR: 60s O2: 98% RA BP: 145/77 RR: 18    Prior to Admission medications  Medication Sig Start Date End Date Taking? Authorizing Provider  albuterol  (PROVENTIL ) (2.5 MG/3ML) 0.083% nebulizer solution Take 3 mLs (2.5 mg total) by nebulization every 6 (six) hours as needed for wheezing or shortness of breath. 07/01/24   Kara Dorn NOVAK, MD  amLODipine  (NORVASC ) 5 MG tablet Take 5 mg by mouth daily. 03/11/21   [provider]  atenolol  (TENORMIN ) 50 MG tablet Take 50 mg by mouth daily.    [provider]  Cholecalciferol (VITAMIN D3) 5000 units CAPS Take 5,000 Units by mouth every evening.     [provider]  clopidogrel  (PLAVIX ) 75 MG tablet Take 75 mg by mouth daily.     [provider]  DULoxetine  (CYMBALTA ) 30 MG capsule Take 60 mg by mouth daily at 12 noon. Patient take 2 tablets daily.    [provider]  Evolocumab  (REPATHA  SURECLICK) 140 MG/ML SOAJ Inject 140 mg into the skin every 14 (fourteen) days. INJECT UNDER THE SKIN EVERY 14 DAYS. 06/08/24   O'NealDarryle Ned, MD  ferrous sulfate  325 (65 FE) MG tablet Take 325 mg by mouth daily with breakfast.    [provider]  fluticasone  (FLONASE ) 50 MCG/ACT nasal spray Place 1 spray into both nostrils daily.    [provider]  Fluticasone -Umeclidin-Vilant (TRELEGY ELLIPTA ) 200-62.5-25 MCG/ACT AEPB Inhale 1 puff into the lungs daily. 04/22/24   Hope Almarie ORN, NP  HYDROcodone  bit-homatropine (HYCODAN) 5-1.5 MG/5ML syrup Take 5 mLs by mouth every 6 (six) hours as needed for cough. 09/30/24   Kara Dorn NOVAK, MD  Ipratropium-Albuterol  (COMBIVENT  RESPIMAT) 20-100 MCG/ACT AERS respimat Inhale 1 puff into the lungs every 4 (four) hours as needed for wheezing or shortness of breath. 08/07/21   Kara Dorn NOVAK, MD  isosorbide  mononitrate (IMDUR ) 30 MG 24 hr tablet Take 30 mg by mouth in the morning and at bedtime. 09/24/14   [provider]  levothyroxine  (SYNTHROID ) 50 MCG tablet Take 50 mcg by mouth every morning.    [provider]  lisinopril  (PRINIVIL ,ZESTRIL ) 40 MG tablet Take 40 mg by mouth daily.    [provider]  LORazepam  (ATIVAN ) 0.5 MG tablet Take 0.5 mg by mouth See admin instructions. Take 0.5 mg by mouth at bedtime and an additional 0.5 mg once a day as needed for anxiety    [provider]  ondansetron  (ZOFRAN -ODT) 4 MG disintegrating tablet Take 1 tablet (4 mg total) by mouth every 8 (eight) hours as needed for nausea or vomiting. 09/29/24   Logan Ubaldo NOVAK, PA-C  pantoprazole  (PROTONIX ) 40 MG tablet Take 40 mg by mouth in the morning and at bedtime.    [provider]  simvastatin  (ZOCOR ) 20 MG tablet Take 40 mg by mouth daily at 6 PM. 06/06/22   [provider]  traMADol  (ULTRAM ) 50 MG tablet Take 1 tablet (50 mg total) by mouth every 6 (six) hours as needed. 08/17/24   Horton, Charmaine FALCON, MD    Allergies: Carvedilol, Buspirone, Dapsone, Niacin and related, Paroxetine hcl, Septra [sulfamethoxazole-trimethoprim], Sertraline hcl, Amoxicillin, Azithromycin , and Wellbutrin [bupropion]    Review of Systems  Updated Vital Signs BP 122/65   Pulse 64   Temp 97.7 F (36.5 C) (Oral)   Resp 17   Ht 1.651 m (5' 5)   Wt 86.2 kg   SpO2 99%   BMI 31.62 kg/m   Physical Exam Vitals and nursing note reviewed.  Constitutional:      General: She is not in acute distress.     Appearance: She is well-developed.  HENT:     Head: Normocephalic and atraumatic.  Eyes:     Conjunctiva/sclera: Conjunctivae normal.  Cardiovascular:     Rate and Rhythm: Normal rate and regular rhythm.  Pulmonary:     Effort: Pulmonary effort is normal. Tachypnea present.     Breath sounds: Decreased breath sounds present.  Abdominal:     General: There is no distension.  Skin:    General: Skin is warm and dry.  Neurological:     Mental Status: She is alert and oriented to person, place, and time.     Cranial Nerves: No cranial nerve deficit.  Psychiatric:        Mood and Affect: Mood normal.     (all labs ordered are listed, but only abnormal results are displayed) Labs Reviewed  CBC - Abnormal; Notable for the following components:      Result Value   WBC 14.9 (*)    HCT 34.8 (*)    MCHC 36.8 (*)    Platelets 418 (*)    All other components within normal limits  BASIC METABOLIC PANEL WITH GFR - Abnormal; Notable for the following components:   Sodium 118 (*)    Chloride 83 (*)    Glucose, Bld 113 (*)    Creatinine, Ser 1.31 (*)    GFR, Estimated 43 (*)    All other components within normal limits  D-DIMER, QUANTITATIVE - Abnormal; Notable for the following components:   D-Dimer, Quant 0.51 (*)    All other components within normal limits  PRO BRAIN NATRIURETIC PEPTIDE - Abnormal; Notable for the following components:   Pro Brain Natriuretic Peptide 532.0 (*)    All other components within normal limits    EKG: EKG Interpretation Date/Time:  Sunday October 03 2024 12:28:33 EST Ventricular Rate:  67 PR Interval:  193 QRS Duration:  156 QT Interval:  472 QTC Calculation: 499 R Axis:   -85  Text Interpretation: Sinus rhythm RBBB and LAFB Probable left ventricular hypertrophy Confirmed by Garrick Charleston [phone]) on 10/03/2024 1:07:01 PM  Radiology: [facility] Chest Port 1 View Result Date: 10/03/2024 CLINICAL DATA:  Shortness of breath.  Recent COVID. EXAM:  PORTABLE CHEST 1 VIEW COMPARISON:  01/23/2024. FINDINGS: The heart size and mediastinal contours are within normal limits. Atherosclerotic calcification of the aorta is noted. No consolidation, effusion, or pneumothorax is seen. No acute osseous abnormality. IMPRESSION: No active disease. Electronically Signed   By: Leita Birmingham M.D.   On: 10/03/2024 14:05     Procedures   Medications Ordered in the ED - No data to display  Medical Decision Making Elderly female, COPD, recent COVID now presents with chest tightness, worse with inspiration, and exertion. Patient received bronchodilator prior to my evaluation, has improved, but given her history, risk profile, differential including COPD exacerbation, pneumonia, pulmonary embolism all considered. Pulse ox 98% room air normal cardiac 70 sinus normal  Amount and/or Complexity of Data Reviewed Independent Historian: EMS External Data Reviewed: notes. Labs: ordered. Decision-making details documented in ED Course. Radiology: ordered and independent interpretation performed. Decision-making details documented in ED Course. ECG/medicine tests: ordered and independent interpretation performed. Decision-making details documented in ED Course.  Risk Prescription drug management. Decision regarding hospitalization. Diagnosis or treatment significantly limited by social determinants of health.   4:48 PM With daughter via telephone we discussed all findings, D-dimer age-appropriate negative, similar to multiple prior studies.  X-ray without evidence for pneumonia, sodium 118 - repeat test pending.  Repeat sodium 118.  With concern for critically abnormal sodium value I discussed patient case with our hospitalist colleagues, patient started fluid resuscitation, will require admission.  Possibilities include SIADH, COVID related hyponatremia as there are no overt precipitants in her med list. Patient admitted to  stepdown for further monitoring, management.  CRITICAL CARE Performed by: Lamar Salen Total critical care time: 40 minutes Critical care time was exclusive of separately billable procedures and treating other patients. Critical care was necessary to treat or prevent imminent or life-threatening deterioration. Critical care was time spent personally by me on the following activities: development of treatment plan with patient and/or surrogate as well as nursing, discussions with consultants, evaluation of patient's response to treatment, examination of patient, obtaining history from patient or surrogate, ordering and performing treatments and interventions, ordering and review of laboratory studies, ordering and review of radiographic studies, pulse oximetry and re-evaluation of patient's condition.   Final diagnoses:  Hyponatremia  Shortness of breath    ED Discharge Orders     None          Salen Lamar, MD 10/03/24 1745

## 2024-10-03 NOTE — ED Triage Notes (Signed)
 Pt BIB by EMS and coming from home. Patient was dx with COVID about a week ago.  She has been on Paxlovid and finished her dose yesterday.  Today reports chest pain on inspiration and shortness of breath d/t pain.  EMS heard some wheezing on her upper left lobe and gave her a duoneb enroute.  HR: 60s O2: 98% RA BP: 145/77 RR: 18

## 2024-10-04 DIAGNOSIS — I739 Peripheral vascular disease, unspecified: Secondary | ICD-10-CM | POA: Diagnosis present

## 2024-10-04 DIAGNOSIS — Z8249 Family history of ischemic heart disease and other diseases of the circulatory system: Secondary | ICD-10-CM | POA: Diagnosis not present

## 2024-10-04 DIAGNOSIS — R0602 Shortness of breath: Secondary | ICD-10-CM | POA: Diagnosis present

## 2024-10-04 DIAGNOSIS — I129 Hypertensive chronic kidney disease with stage 1 through stage 4 chronic kidney disease, or unspecified chronic kidney disease: Secondary | ICD-10-CM | POA: Diagnosis present

## 2024-10-04 DIAGNOSIS — E222 Syndrome of inappropriate secretion of antidiuretic hormone: Secondary | ICD-10-CM | POA: Diagnosis present

## 2024-10-04 DIAGNOSIS — Z79899 Other long term (current) drug therapy: Secondary | ICD-10-CM | POA: Diagnosis not present

## 2024-10-04 DIAGNOSIS — J441 Chronic obstructive pulmonary disease with (acute) exacerbation: Secondary | ICD-10-CM | POA: Diagnosis present

## 2024-10-04 DIAGNOSIS — Z79890 Hormone replacement therapy (postmenopausal): Secondary | ICD-10-CM | POA: Diagnosis not present

## 2024-10-04 DIAGNOSIS — E66811 Obesity, class 1: Secondary | ICD-10-CM | POA: Diagnosis present

## 2024-10-04 DIAGNOSIS — Z0389 Encounter for observation for other suspected diseases and conditions ruled out: Secondary | ICD-10-CM | POA: Diagnosis not present

## 2024-10-04 DIAGNOSIS — Z881 Allergy status to other antibiotic agents status: Secondary | ICD-10-CM | POA: Diagnosis not present

## 2024-10-04 DIAGNOSIS — E785 Hyperlipidemia, unspecified: Secondary | ICD-10-CM | POA: Diagnosis present

## 2024-10-04 DIAGNOSIS — Z880 Allergy status to penicillin: Secondary | ICD-10-CM | POA: Diagnosis not present

## 2024-10-04 DIAGNOSIS — E039 Hypothyroidism, unspecified: Secondary | ICD-10-CM | POA: Diagnosis present

## 2024-10-04 DIAGNOSIS — E871 Hypo-osmolality and hyponatremia: Secondary | ICD-10-CM | POA: Diagnosis not present

## 2024-10-04 DIAGNOSIS — R7303 Prediabetes: Secondary | ICD-10-CM | POA: Diagnosis present

## 2024-10-04 DIAGNOSIS — N1831 Chronic kidney disease, stage 3a: Secondary | ICD-10-CM | POA: Diagnosis present

## 2024-10-04 DIAGNOSIS — K219 Gastro-esophageal reflux disease without esophagitis: Secondary | ICD-10-CM | POA: Diagnosis present

## 2024-10-04 DIAGNOSIS — Z825 Family history of asthma and other chronic lower respiratory diseases: Secondary | ICD-10-CM | POA: Diagnosis not present

## 2024-10-04 DIAGNOSIS — I2510 Atherosclerotic heart disease of native coronary artery without angina pectoris: Secondary | ICD-10-CM | POA: Diagnosis present

## 2024-10-04 DIAGNOSIS — F419 Anxiety disorder, unspecified: Secondary | ICD-10-CM | POA: Diagnosis present

## 2024-10-04 DIAGNOSIS — Z6831 Body mass index (BMI) 31.0-31.9, adult: Secondary | ICD-10-CM | POA: Diagnosis not present

## 2024-10-04 DIAGNOSIS — Z885 Allergy status to narcotic agent status: Secondary | ICD-10-CM | POA: Diagnosis not present

## 2024-10-04 DIAGNOSIS — Z8616 Personal history of COVID-19: Secondary | ICD-10-CM | POA: Diagnosis not present

## 2024-10-04 DIAGNOSIS — Z7902 Long term (current) use of antithrombotics/antiplatelets: Secondary | ICD-10-CM | POA: Diagnosis not present

## 2024-10-04 DIAGNOSIS — F32A Depression, unspecified: Secondary | ICD-10-CM | POA: Diagnosis present

## 2024-10-04 DIAGNOSIS — Z87891 Personal history of nicotine dependence: Secondary | ICD-10-CM | POA: Diagnosis not present

## 2024-10-04 LAB — BASIC METABOLIC PANEL WITH GFR
Anion gap: 10 (ref 5–15)
Anion gap: 11 (ref 5–15)
Anion gap: 9 (ref 5–15)
BUN: 16 mg/dL (ref 8–23)
BUN: 16 mg/dL (ref 8–23)
BUN: 18 mg/dL (ref 8–23)
CO2: 22 mmol/L (ref 22–32)
CO2: 21 mmol/L — ABNORMAL LOW (ref 22–32)
CO2: 22 mmol/L (ref 22–32)
Calcium: 9.4 mg/dL (ref 8.9–10.3)
Calcium: 9.5 mg/dL (ref 8.9–10.3)
Calcium: 9.5 mg/dL (ref 8.9–10.3)
Chloride: 94 mmol/L — ABNORMAL LOW (ref 98–111)
Chloride: 95 mmol/L — ABNORMAL LOW (ref 98–111)
Chloride: 95 mmol/L — ABNORMAL LOW (ref 98–111)
Creatinine, Ser: 1.15 mg/dL — ABNORMAL HIGH (ref 0.44–1.00)
Creatinine, Ser: 1.11 mg/dL — ABNORMAL HIGH (ref 0.44–1.00)
Creatinine, Ser: 1.17 mg/dL — ABNORMAL HIGH (ref 0.44–1.00)
GFR, Estimated: 50 mL/min — ABNORMAL LOW (ref >60)
GFR, Estimated: 52 mL/min — ABNORMAL LOW (ref >60)
GFR, Estimated: 49 mL/min — ABNORMAL LOW (ref >60)
Glucose, Bld: 115 mg/dL — ABNORMAL HIGH (ref 70–99)
Glucose, Bld: 108 mg/dL — ABNORMAL HIGH (ref 70–99)
Glucose, Bld: 121 mg/dL — ABNORMAL HIGH (ref 70–99)
Potassium: 4.4 mmol/L (ref 3.5–5.1)
Potassium: 5 mmol/L (ref 3.5–5.1)
Potassium: 4.5 mmol/L (ref 3.5–5.1)
Sodium: 126 mmol/L — ABNORMAL LOW (ref 135–145)
Sodium: 126 mmol/L — ABNORMAL LOW (ref 135–145)
Sodium: 126 mmol/L — ABNORMAL LOW (ref 135–145)

## 2024-10-04 LAB — CBC
HCT: 33.4 % — ABNORMAL LOW (ref 36.0–46.0)
Hemoglobin: 11.8 g/dL — ABNORMAL LOW (ref 12.0–15.0)
MCH: 31 pg (ref 26.0–34.0)
MCHC: 35.3 g/dL (ref 30.0–36.0)
MCV: 87.7 fL (ref 80.0–100.0)
Platelets: 352 K/uL (ref 150–400)
RBC: 3.81 MIL/uL — ABNORMAL LOW (ref 3.87–5.11)
RDW: 12.3 % (ref 11.5–15.5)
WBC: 11.5 K/uL — ABNORMAL HIGH (ref 4.0–10.5)
nRBC: 0 % (ref 0.0–0.2)

## 2024-10-04 LAB — COMPREHENSIVE METABOLIC PANEL WITH GFR
ALT: 31 U/L (ref 0–44)
AST: 34 U/L (ref 15–41)
Albumin: 3.7 g/dL (ref 3.5–5.0)
Alkaline Phosphatase: 60 U/L (ref 38–126)
Anion gap: 9 (ref 5–15)
BUN: 17 mg/dL (ref 8–23)
CO2: 21 mmol/L — ABNORMAL LOW (ref 22–32)
Calcium: 9.3 mg/dL (ref 8.9–10.3)
Chloride: 90 mmol/L — ABNORMAL LOW (ref 98–111)
Creatinine, Ser: 1.14 mg/dL — ABNORMAL HIGH (ref 0.44–1.00)
GFR, Estimated: 50 mL/min — ABNORMAL LOW (ref >60)
Glucose, Bld: 114 mg/dL — ABNORMAL HIGH (ref 70–99)
Potassium: 4.4 mmol/L (ref 3.5–5.1)
Sodium: 120 mmol/L — ABNORMAL LOW (ref 135–145)
Total Bilirubin: 0.5 mg/dL (ref 0.0–1.2)
Total Protein: 6.4 g/dL — ABNORMAL LOW (ref 6.5–8.1)

## 2024-10-04 LAB — MRSA NEXT GEN BY PCR, NASAL: MRSA by PCR Next Gen: NOT DETECTED

## 2024-10-04 LAB — OSMOLALITY, URINE: Osmolality, Ur: 327 mosm/kg (ref 300–900)

## 2024-10-04 LAB — TSH: TSH: 1.43 u[IU]/mL (ref 0.350–4.500)

## 2024-10-04 MED ORDER — LORAZEPAM 0.5 MG PO TABS
0.5000 mg | ORAL_TABLET | Freq: Four times a day (QID) | ORAL | Status: DC | PRN
  Administered 2024-10-04 – 2024-10-07 (×8): 0.5 mg via ORAL
  Filled 2024-10-04 (×8): qty 1

## 2024-10-04 MED ORDER — LEVOTHYROXINE SODIUM 50 MCG PO TABS
50.0000 ug | ORAL_TABLET | Freq: Every day | ORAL | Status: DC
  Administered 2024-10-05 – 2024-10-08 (×4): 50 ug via ORAL
  Filled 2024-10-04 (×4): qty 1

## 2024-10-04 MED ORDER — DULOXETINE HCL 20 MG PO CPEP
60.0000 mg | ORAL_CAPSULE | Freq: Every day | ORAL | Status: DC
  Administered 2024-10-04 – 2024-10-08 (×5): 60 mg via ORAL
  Filled 2024-10-04: qty 2
  Filled 2024-10-04 (×4): qty 3

## 2024-10-04 NOTE — Plan of Care (Signed)

## 2024-10-04 NOTE — Progress Notes (Signed)
 PROGRESS NOTE  [place] FMW:[id number] DOB: 09/15/1951 DOA: 10/03/2024 PCP: Okey Carlin Redbird, MD  Hospital Course/Subjective: Shannon Roberts is a 73 y.o. female with medical history significant of allergic rhinitis, normocytic anemia, anxiety, depression, colitis, diverticulosis, female bladder prolapse, fibroid, GERD, heart murmur, adenomatous colon polyps, hyperlipidemia, hypertension, IBS, insomnia abscess of the lip, mitral valve prolapse, peripheral vascular disease, diet-controlled prediabetes, right bundle branch block, sciatica, dyspnea, history of CVA, COPD who presented to the emergency department with complaint of dyspnea after being diagnosed with COVID-19 earlier this week.  She was also drinking a lot of water , feeling weak and confused. Found to have severe hyponatremia and placed on fluid restriction.   Assessment/Plan: Principal Problem: Hyponatremia Continue free water  restriction, patient is feeling much better and Na up to 126 this AM. Discussed at length with the patient.. Monitor BMP q6 hours today. Replace other electrolytes as needed. Likely home in AM if has continued improvement.   Active Problems: GERD (gastroesophageal reflux disease) Continue home PPI.   COPD, severe (HCC) Continue albuterol  nebs as needed, but stable on RA this AM with no cough or wheezing.   HTN (hypertension), malignant Will resume antihypertensives   Hypothyroidism Will order levothyroxine  pending med rec.   Hyperlipidemia Continue simvastatin  40 mg p.o. daily.   Pre-diabetes Diet controlled. Glucose in the low 100s.  DVT Prophylaxis: Lovenox   Code Status: FULL   Family Communication: None   Disposition Plan: Home in AM   Antimicrobials: Anti-infectives (From admission, onward)    None       Objective: Vitals:   10/04/24 0600 10/04/24 0700 10/04/24 0800 10/04/24 0827  BP: (!) 131/50 (!) 119/55 (!) 136/54   Pulse: (!) 59 (!) 57 (!) 59 64  Resp: 13  13 13 18   Temp:    97.6 F (36.4 C)  TempSrc:    Axillary  SpO2: 93% 93% 95% 97%  Weight:      Height:        Intake/Output Summary (Last 24 hours) at 10/04/2024 0938 Last data filed at 10/04/2024 0500 Gross per 24 hour  Intake 2023.66 ml  Output 1000 ml  Net 1023.66 ml   Filed Weights   10/03/24 1229 10/03/24 1251 10/03/24 2200  Weight: 86.2 kg 86.2 kg 84.6 kg   Exam: General:  Alert, oriented, calm, in no acute distress on RA Cardiovascular: RRR, no murmurs or rubs, no peripheral edema  Respiratory: clear to auscultation bilaterally, no wheezes, no crackles  Abdomen: soft, nontender, nondistended, normal bowel tones heard  Skin: dry, no rashes  Musculoskeletal: no joint effusions, normal range of motion  Psychiatric: appropriate affect, normal speech  Neurologic: extraocular muscles intact, clear speech, moving all extremities with intact sensorium   Data Reviewed: CBC: Recent Labs  Lab 09/28/24 2230 10/03/24 1444 10/03/24 1704 10/04/24 0300  WBC 10.4 14.9*  --  11.5*  NEUTROABS 9.2*  --   --   --   HGB 13.0 12.8 11.9* 11.8*  HCT 36.1 34.8* 35.0* 33.4*  MCV 88.3 86.4  --  87.7  PLT 332 418*  --  352   Basic Metabolic Panel: Recent Labs  Lab 10/03/24 1444 10/03/24 1704 10/03/24 2157 10/04/24 0300 10/04/24 0845  NA 118* 118* 118* 120* 126*  K 4.5 4.3 4.3 4.4 4.4  CL 83* 84* 87* 90* 94*  CO2 22  --  21* 21* 22  GLUCOSE 113* 118* 136* 114* 115*  BUN 20 20 18 17 16   CREATININE 1.31* 1.30* 1.15*  1.14* 1.15*  CALCIUM  10.0  --  9.4 9.3 9.4   GFR: Estimated Creatinine Clearance: 46.1 mL/min (A) (by C-G formula based on SCr of 1.15 mg/dL (H)). Liver Function Tests: Recent Labs  Lab 10/04/24 0300  AST 34  ALT 31  ALKPHOS 60  BILITOT 0.5  PROT 6.4*  ALBUMIN  3.7   No results for input(s): LIPASE, AMYLASE in the last 168 hours. No results for input(s): AMMONIA in the last 168 hours. Coagulation Profile: No results for input(s): INR, PROTIME  in the last 168 hours. Cardiac Enzymes: No results for input(s): CKTOTAL, CKMB, CKMBINDEX, TROPONINI in the last 168 hours. BNP (last 3 results) Recent Labs    10/03/24 1444  PROBNP 532.0*   HbA1C: No results for input(s): HGBA1C in the last 72 hours. CBG: No results for input(s): GLUCAP in the last 168 hours. Lipid Profile: No results for input(s): CHOL, HDL, LDLCALC, TRIG, CHOLHDL, LDLDIRECT in the last 72 hours. Thyroid  Function Tests: No results for input(s): TSH, T4TOTAL, FREET4, T3FREE, THYROIDAB in the last 72 hours. Anemia Panel: No results for input(s): VITAMINB12, FOLATE, FERRITIN, TIBC, IRON, RETICCTPCT in the last 72 hours. Urine analysis:    Component Value Date/Time   COLORURINE YELLOW 10/03/2024 1847   APPEARANCEUR CLEAR 10/03/2024 1847   LABSPEC 1.010 10/03/2024 1847   PHURINE 6.0 10/03/2024 1847   GLUCOSEU NEGATIVE 10/03/2024 1847   HGBUR NEGATIVE 10/03/2024 1847   BILIRUBINUR NEGATIVE 10/03/2024 1847   KETONESUR NEGATIVE 10/03/2024 1847   PROTEINUR NEGATIVE 10/03/2024 1847   UROBILINOGEN 0.2 01/14/2014 1505   NITRITE NEGATIVE 10/03/2024 1847   LEUKOCYTESUR NEGATIVE 10/03/2024 1847   Sepsis Labs: @LABRCNTIP (procalcitonin:4,lacticidven:4)  ) Recent Results (from the past 240 hours)  MRSA Next Gen by PCR, Nasal     Status: None   Collection Time: 10/03/24  9:45 PM   Specimen: Nasal Mucosa; Nasal Swab  Result Value Ref Range Status   MRSA by PCR Next Gen NOT DETECTED NOT DETECTED Final    Comment: (NOTE) The GeneXpert MRSA Assay (FDA approved for NASAL specimens only), is one component of a comprehensive MRSA colonization surveillance program. It is not intended to diagnose MRSA infection nor to guide or monitor treatment for MRSA infections. Test performance is not FDA approved in patients less than 59 years old. Performed at [facility], [address], [place], [place] [address]       Studies: DG Chest Port 1 View Result Date: 10/03/2024 CLINICAL DATA:  Shortness of breath.  Recent COVID. EXAM: PORTABLE CHEST 1 VIEW COMPARISON:  01/23/2024. FINDINGS: The heart size and mediastinal contours are within normal limits. Atherosclerotic calcification of the aorta is noted. No consolidation, effusion, or pneumothorax is seen. No acute osseous abnormality. IMPRESSION: No active disease. Electronically Signed   By: Leita Birmingham M.D.   On: 10/03/2024 14:05    Scheduled Meds:  Chlorhexidine  Gluconate Cloth  6 each Topical Daily   enoxaparin  (LOVENOX ) injection  40 mg Subcutaneous Q24H   pantoprazole   40 mg Oral BID   simvastatin   40 mg Oral q1800    Continuous Infusions:   LOS: 0 days   Time spent: 31 minutes  Shannon Balik CHRISTELLA Gail, MD [facility] Pager [phone]  If 7PM-7AM, please contact night-coverage [email or web address] Password [email or web address] 10/04/2024, 9:38 AM

## 2024-10-04 NOTE — Plan of Care (Signed)

## 2024-10-05 ENCOUNTER — Telehealth: Payer: Self-pay

## 2024-10-05 ENCOUNTER — Inpatient Hospital Stay (HOSPITAL_COMMUNITY)

## 2024-10-05 LAB — BASIC METABOLIC PANEL WITH GFR
Anion gap: 10 (ref 5–15)
Anion gap: 10 (ref 5–15)
Anion gap: 12 (ref 5–15)
Anion gap: 12 (ref 5–15)
BUN: 19 mg/dL (ref 8–23)
BUN: 18 mg/dL (ref 8–23)
BUN: 19 mg/dL (ref 8–23)
BUN: 19 mg/dL (ref 8–23)
CO2: 22 mmol/L (ref 22–32)
CO2: 22 mmol/L (ref 22–32)
CO2: 19 mmol/L — ABNORMAL LOW (ref 22–32)
CO2: 21 mmol/L — ABNORMAL LOW (ref 22–32)
Calcium: 9.5 mg/dL (ref 8.9–10.3)
Calcium: 9.5 mg/dL (ref 8.9–10.3)
Calcium: 9.1 mg/dL (ref 8.9–10.3)
Calcium: 9.9 mg/dL (ref 8.9–10.3)
Chloride: 94 mmol/L — ABNORMAL LOW (ref 98–111)
Chloride: 94 mmol/L — ABNORMAL LOW (ref 98–111)
Chloride: 95 mmol/L — ABNORMAL LOW (ref 98–111)
Chloride: 95 mmol/L — ABNORMAL LOW (ref 98–111)
Creatinine, Ser: 1.32 mg/dL — ABNORMAL HIGH (ref 0.44–1.00)
Creatinine, Ser: 1.28 mg/dL — ABNORMAL HIGH (ref 0.44–1.00)
Creatinine, Ser: 1.24 mg/dL — ABNORMAL HIGH (ref 0.44–1.00)
Creatinine, Ser: 1.25 mg/dL — ABNORMAL HIGH (ref 0.44–1.00)
GFR, Estimated: 42 mL/min — ABNORMAL LOW (ref >60)
GFR, Estimated: 44 mL/min — ABNORMAL LOW (ref >60)
GFR, Estimated: 45 mL/min — ABNORMAL LOW (ref >60)
GFR, Estimated: 45 mL/min — ABNORMAL LOW (ref >60)
Glucose, Bld: 118 mg/dL — ABNORMAL HIGH (ref 70–99)
Glucose, Bld: 141 mg/dL — ABNORMAL HIGH (ref 70–99)
Glucose, Bld: 169 mg/dL — ABNORMAL HIGH (ref 70–99)
Glucose, Bld: 119 mg/dL — ABNORMAL HIGH (ref 70–99)
Potassium: 4.5 mmol/L (ref 3.5–5.1)
Potassium: 4.1 mmol/L (ref 3.5–5.1)
Potassium: 4.2 mmol/L (ref 3.5–5.1)
Potassium: 4.2 mmol/L (ref 3.5–5.1)
Sodium: 126 mmol/L — ABNORMAL LOW (ref 135–145)
Sodium: 126 mmol/L — ABNORMAL LOW (ref 135–145)
Sodium: 126 mmol/L — ABNORMAL LOW (ref 135–145)
Sodium: 128 mmol/L — ABNORMAL LOW (ref 135–145)

## 2024-10-05 LAB — SODIUM, URINE, RANDOM: Sodium, Ur: 104 mmol/L

## 2024-10-05 MED ORDER — FLUTICASONE PROPIONATE 50 MCG/ACT NA SUSP: 1.0000 | Freq: Every day | NASAL | Status: DC | PRN

## 2024-10-05 MED ORDER — SODIUM CHLORIDE 0.9 % IV SOLN: INTRAVENOUS | Status: DC

## 2024-10-05 MED ORDER — FERROUS SULFATE 325 (65 FE) MG PO TABS
325.0000 mg | ORAL_TABLET | Freq: Every day | ORAL | Status: DC
  Administered 2024-10-06 – 2024-10-08 (×3): 325 mg via ORAL
  Filled 2024-10-05 (×3): qty 1

## 2024-10-05 MED ORDER — GUAIFENESIN-DM 100-10 MG/5ML PO SYRP
5.0000 mL | ORAL_SOLUTION | ORAL | Status: DC | PRN
  Administered 2024-10-05 – 2024-10-07 (×4): 5 mL via ORAL
  Filled 2024-10-05 (×5): qty 5

## 2024-10-05 MED ORDER — BUDESON-GLYCOPYRROL-FORMOTEROL 160-9-4.8 MCG/ACT IN AERO
2.0000 | INHALATION_SPRAY | Freq: Two times a day (BID) | RESPIRATORY_TRACT | Status: DC
  Administered 2024-10-05 – 2024-10-08 (×6): 2 via RESPIRATORY_TRACT
  Filled 2024-10-05: qty 5.9

## 2024-10-05 MED ORDER — ATORVASTATIN CALCIUM 10 MG PO TABS
20.0000 mg | ORAL_TABLET | Freq: Every day | ORAL | Status: DC
  Administered 2024-10-05 – 2024-10-07 (×3): 20 mg via ORAL
  Filled 2024-10-05 (×3): qty 2

## 2024-10-05 MED ORDER — AMLODIPINE BESYLATE 5 MG PO TABS
5.0000 mg | ORAL_TABLET | Freq: Every day | ORAL | Status: DC
  Administered 2024-10-05 – 2024-10-08 (×4): 5 mg via ORAL
  Filled 2024-10-05 (×4): qty 1

## 2024-10-05 MED ORDER — CLOPIDOGREL BISULFATE 75 MG PO TABS
75.0000 mg | ORAL_TABLET | Freq: Every day | ORAL | Status: DC
  Administered 2024-10-05 – 2024-10-08 (×4): 75 mg via ORAL
  Filled 2024-10-05 (×4): qty 1

## 2024-10-05 MED ORDER — SODIUM CHLORIDE 1 G PO TABS
2.0000 g | ORAL_TABLET | Freq: Four times a day (QID) | ORAL | Status: AC
  Administered 2024-10-05 – 2024-10-06 (×3): 2 g via ORAL
  Filled 2024-10-05 (×3): qty 2

## 2024-10-05 MED ORDER — ATENOLOL 25 MG PO TABS
50.0000 mg | ORAL_TABLET | Freq: Every day | ORAL | Status: DC
  Administered 2024-10-05 – 2024-10-08 (×4): 50 mg via ORAL
  Filled 2024-10-05 (×4): qty 2

## 2024-10-05 NOTE — Progress Notes (Signed)
 PROGRESS NOTE  [place] FMW:[id number] DOB: 02-06-1951 DOA: 10/03/2024 PCP: Okey Carlin Redbird, MD  Hospital Course/Subjective: Shannon Roberts is a 73 y.o. female with medical history significant of allergic rhinitis, normocytic anemia, anxiety, depression, colitis, diverticulosis, female bladder prolapse, fibroid, GERD, heart murmur, adenomatous colon polyps, hyperlipidemia, hypertension, IBS, insomnia abscess of the lip, mitral valve prolapse, peripheral vascular disease, diet-controlled prediabetes, right bundle branch block, sciatica, dyspnea, history of CVA, COPD who presented to the emergency department with complaint of dyspnea after being diagnosed with COVID-19 earlier this week.  She was also drinking a lot of water , feeling weak and confused. Found to have severe hyponatremia and placed on fluid restriction, she has been feeling better but Na has stalled.  Assessment/Plan: Hypervolemic Hyponatremia- likely from copious water  intake with recent COVID Continue free water  restriction, patient is feeling much better and Na up to 126 but no longer improving. IVF was stopped yesterday as daughter was concerned about some transient hypoxia. Discussed at length with the patient and daughter at the bedside this AM. Monitor BMP q6 hours today Replace other electrolytes as needed. Consulted nephrology Dr. Geralynn today.   Active Problems: GERD (gastroesophageal reflux disease) Continue home PPI.   COPD, severe (HCC) Continue Breztri , flonase  and albuterol  nebs as needed, but stable on RA this AM with no cough or wheezing.   HTN (hypertension), malignant Will resume antihypertensives today but not all of them as BP is low normal   Hypothyroidism Home levothyroxine  resumed   Hyperlipidemia Continue simvastatin  40 mg p.o. daily.   Pre-diabetes Diet controlled. Glucose in the low 100s.  DVT Prophylaxis: Lovenox   Code Status: FULL   Family Communication: Discussed with  daughter at length at the bedside this AM.   Disposition Plan: Home in AM   Antimicrobials: Anti-infectives (From admission, onward)    None       Objective: Vitals:   10/05/24 0428 10/05/24 0502 10/05/24 1000 10/05/24 1039  BP:  121/83    Pulse:  80    Resp:  17 15   Temp: 97.7 F (36.5 C) 97.7 F (36.5 C)    TempSrc: Oral Oral    SpO2:  100%  99%  Weight:      Height:        Intake/Output Summary (Last 24 hours) at 10/05/2024 1138 Last data filed at 10/05/2024 1044 Gross per 24 hour  Intake --  Output 350 ml  Net -350 ml   Filed Weights   10/03/24 1229 10/03/24 1251 10/03/24 2200  Weight: 86.2 kg 86.2 kg 84.6 kg   Exam: General:  Alert, oriented, calm, in no acute distress on RA Cardiovascular: RRR, no murmurs or rubs, no peripheral edema  Respiratory: clear to auscultation bilaterally, no wheezes, no crackles  Abdomen: soft, nontender, nondistended, normal bowel tones heard  Skin: dry, no rashes  Musculoskeletal: no joint effusions, normal range of motion  Psychiatric: appropriate affect, normal speech  Neurologic: extraocular muscles intact, clear speech, moving all extremities with intact sensorium   Data Reviewed: CBC: Recent Labs  Lab 09/28/24 2230 10/03/24 1444 10/03/24 1704 10/04/24 0300  WBC 10.4 14.9*  --  11.5*  NEUTROABS 9.2*  --   --   --   HGB 13.0 12.8 11.9* 11.8*  HCT 36.1 34.8* 35.0* 33.4*  MCV 88.3 86.4  --  87.7  PLT 332 418*  --  352   Basic Metabolic Panel: Recent Labs  Lab 10/04/24 0845 10/04/24 1453 10/04/24 2115 10/05/24 0320 10/05/24 0849  NA 126* 126* 126* 126* 126*  K 4.4 5.0 4.5 4.5 4.1  CL 94* 95* 95* 94* 94*  CO2 22 21* 22 22 22   GLUCOSE 115* 108* 121* 118* 141*  BUN 16 16 18 19 18   CREATININE 1.15* 1.11* 1.17* 1.32* 1.28*  CALCIUM  9.4 9.5 9.5 9.5 9.5   GFR: Estimated Creatinine Clearance: 41.4 mL/min (A) (by C-G formula based on SCr of 1.28 mg/dL (H)). Liver Function Tests: Recent Labs  Lab  10/04/24 0300  AST 34  ALT 31  ALKPHOS 60  BILITOT 0.5  PROT 6.4*  ALBUMIN  3.7   No results for input(s): LIPASE, AMYLASE in the last 168 hours. No results for input(s): AMMONIA in the last 168 hours. Coagulation Profile: No results for input(s): INR, PROTIME in the last 168 hours. Cardiac Enzymes: No results for input(s): CKTOTAL, CKMB, CKMBINDEX, TROPONINI in the last 168 hours. BNP (last 3 results) Recent Labs    10/03/24 1444  PROBNP 532.0*   HbA1C: No results for input(s): HGBA1C in the last 72 hours. CBG: No results for input(s): GLUCAP in the last 168 hours. Lipid Profile: No results for input(s): CHOL, HDL, LDLCALC, TRIG, CHOLHDL, LDLDIRECT in the last 72 hours. Thyroid  Function Tests: Recent Labs    10/04/24 1121  TSH 1.430   Anemia Panel: No results for input(s): VITAMINB12, FOLATE, FERRITIN, TIBC, IRON, RETICCTPCT in the last 72 hours. Urine analysis:    Component Value Date/Time   COLORURINE YELLOW 10/03/2024 1847   APPEARANCEUR CLEAR 10/03/2024 1847   LABSPEC 1.010 10/03/2024 1847   PHURINE 6.0 10/03/2024 1847   GLUCOSEU NEGATIVE 10/03/2024 1847   HGBUR NEGATIVE 10/03/2024 1847   BILIRUBINUR NEGATIVE 10/03/2024 1847   KETONESUR NEGATIVE 10/03/2024 1847   PROTEINUR NEGATIVE 10/03/2024 1847   UROBILINOGEN 0.2 01/14/2014 1505   NITRITE NEGATIVE 10/03/2024 1847   LEUKOCYTESUR NEGATIVE 10/03/2024 1847   Sepsis Labs: @LABRCNTIP (procalcitonin:4,lacticidven:4)  ) Recent Results (from the past 240 hours)  MRSA Next Gen by PCR, Nasal     Status: None   Collection Time: 10/03/24  9:45 PM   Specimen: Nasal Mucosa; Nasal Swab  Result Value Ref Range Status   MRSA by PCR Next Gen NOT DETECTED NOT DETECTED Final    Comment: (NOTE) The GeneXpert MRSA Assay (FDA approved for NASAL specimens only), is one component of a comprehensive MRSA colonization surveillance program. It is not intended to diagnose MRSA  infection nor to guide or monitor treatment for MRSA infections. Test performance is not FDA approved in patients less than 78 years old. Performed at [facility], [address] [place], [place] [address]      Studies: No results found.   Scheduled Meds:  amLODipine   5 mg Oral Daily   atenolol   50 mg Oral Daily   budesonide -glycopyrrolate -formoterol   2 puff Inhalation BID   clopidogrel   75 mg Oral Daily   DULoxetine   60 mg Oral Daily   enoxaparin  (LOVENOX ) injection  40 mg Subcutaneous Q24H   [START ON 10/06/2024] ferrous sulfate   325 mg Oral Q breakfast   levothyroxine   50 mcg Oral Q0600   pantoprazole   40 mg Oral BID   simvastatin   40 mg Oral q1800    Continuous Infusions:  sodium chloride  150 mL/hr at 10/05/24 0818     LOS: 1 day   Time spent: 31 minutes  Jatara Huettner CHRISTELLA Gail, MD [facility] Pager [phone]  If 7PM-7AM, please contact night-coverage [email or web address] Password [email or web address] 10/05/2024, 11:38 AM

## 2024-10-05 NOTE — Plan of Care (Signed)
   Problem: Clinical Measurements: Goal: Ability to maintain clinical measurements within normal limits will improve Outcome: Progressing Goal: Will remain free from infection Outcome: Progressing Goal: Diagnostic test results will improve Outcome: Progressing Goal: Respiratory complications will improve Outcome: Progressing Goal: Cardiovascular complication will be avoided Outcome: Progressing   Problem: Coping: Goal: Level of anxiety will decrease Outcome: Progressing

## 2024-10-05 NOTE — Progress Notes (Signed)
 Urine sample sent to lab. 350 ml yellow clear output.

## 2024-10-05 NOTE — Consult Note (Signed)
 Renal Service Consult Note [facility]  NEVIAH BRAUD 10/05/2024 Lamar JONETTA Fret, MD Requesting Physician: Dr. Zella  Reason for Consult: Hyponatremia HPI: The patient is a 73 y.o. year-old w/ PMH as below who presented on 10/03/2024 coming from home reporting chest pain on inspiration and shortness of breath due to pain.  EMS heard some wheezing and gave her DuoNeb and route.  In ED BP 140/78, heart rate 60s, RR 18, 98% on room air.  Sodium was 118, CO2 22, BUN 20 and creatinine 1.31.  proBNP 532.  WBC 14K, Hgb 12.8.  Patient was admitted for hyponatremia.  She was given normal saline at 125 cc an hour on 12/14 and 12/15 with improvement of serum sodium up to 126 yesterday evening.  However it is now stalled at 126 and is not getting any better or worse with IV fluids.  We are asked to see for hyponatremia.   Pt seen in room.  She states he had COVID about 9 days ago and went most of that time without eating much solid food just drinking water .  Denies any history of sodium issues.  She is followed by [facility] for mild CKD and she is seen about once a year.   ROS - denies CP, no joint pain, no HA, no blurry vision, no rash, no diarrhea, no nausea/ vomiting   Past Medical History  Past Medical History:  Diagnosis Date   Allergic rhinitis    Anemia    per hosp. - recently- pt. told that she is anemic   Anxiety    Colitis    COPD (chronic obstructive pulmonary disease) (HCC)    no o2   Depression    Diverticulosis    Female bladder prolapse    Fibroids    GERD (gastroesophageal reflux disease)    Heart murmur    Hx of adenomatous colonic polyps 08/26/2019   Hyperlipemia    Hypertension    IBS (irritable bowel syndrome)    Insomnia    Lip abscess 04/05/2016   Mitral valve prolapse    Peripheral vascular disease    Pre-diabetes 08/03/2019   Diet controlled     Right bundle branch block    Sciatic nerve disease    Shortness of breath    Stroke  [facility]) March 2015   Past Surgical History  Past Surgical History:  Procedure Laterality Date   CAROTID ENDARTERECTOMY Right January 17, 2014   CE   CHOLECYSTECTOMY     COLONOSCOPY     ENDARTERECTOMY Right 01/17/2014   Procedure: RIGHT CAROTID ARTERY ENDARTERECTOMY WITH DACRON PATCH ANGIOPLASTY;  Surgeon: Krystal JULIANNA Doing, MD;  Location: [facility] OR;  Service: Vascular;  Laterality: Right;   UPPER GASTROINTESTINAL ENDOSCOPY     Family History  Family History  Problem Relation Age of Onset   Breast cancer Mother    Ovarian cancer Mother    AAA (abdominal aortic aneurysm) Mother    Emphysema Mother    Lung cancer Father    Emphysema Paternal Aunt    Pancreatic cancer Maternal Grandmother    Diabetes Maternal Aunt    Breast cancer Maternal Aunt    Heart disease Paternal Grandfather    Diabetes Paternal Grandfather    Kidney disease Maternal Aunt    Colon cancer Paternal Aunt    Colon polyps Neg Hx    Esophageal cancer Neg Hx    Rectal cancer Neg Hx    Stomach cancer Neg Hx    Social History  reports that she quit smoking about 10 years ago. Her smoking use included cigarettes. She started smoking about 57 years ago. She has a 70.6 pack-year smoking history. She has never used smokeless tobacco. She reports that she does not drink alcohol and does not use drugs. Allergies Allergies[1] Home medications Prior to Admission medications  Medication Sig Start Date End Date Taking? Authorizing Provider  albuterol  (PROVENTIL ) (2.5 MG/3ML) 0.083% nebulizer solution Take 3 mLs (2.5 mg total) by nebulization every 6 (six) hours as needed for wheezing or shortness of breath. 07/01/24  Yes Kara Dorn NOVAK, MD  amLODipine  (NORVASC ) 5 MG tablet Take 5 mg by mouth daily. 03/11/21  Yes [provider]  atenolol  (TENORMIN ) 50 MG tablet Take 50 mg by mouth daily.   Yes [provider]  Cholecalciferol (VITAMIN D3) 5000 units CAPS Take 5,000 Units by mouth See admin instructions. Take 5,000  units by mouth on Saturdays and Sundays   Yes [provider]  clopidogrel  (PLAVIX ) 75 MG tablet Take 75 mg by mouth daily.    Yes [provider]  doxycycline  (VIBRA -TABS) 100 MG tablet Take 100 mg by mouth 2 (two) times daily with a meal. 09/26/24 10/06/24 Yes [provider]  DULoxetine  (CYMBALTA ) 30 MG capsule Take 60 mg by mouth in the morning.   Yes [provider]  Evolocumab  (REPATHA  SURECLICK) 140 MG/ML SOAJ Inject 140 mg into the skin every 14 (fourteen) days. INJECT UNDER THE SKIN EVERY 14 DAYS. Patient taking differently: Inject 140 mg into the skin every 14 (fourteen) days. 06/08/24  Yes O'Neal, Darryle Ned, MD  ferrous sulfate  325 (65 FE) MG tablet Take 325 mg by mouth daily with breakfast.   Yes [provider]  fluticasone  (FLONASE ) 50 MCG/ACT nasal spray Place 1 spray into both nostrils 2 (two) times daily as needed for allergies or rhinitis.   Yes [provider]  Fluticasone -Umeclidin-Vilant (TRELEGY ELLIPTA ) 200-62.5-25 MCG/ACT AEPB Inhale 1 puff into the lungs daily. 04/22/24  Yes Hope Almarie ORN, NP  Ipratropium-Albuterol  (COMBIVENT  RESPIMAT) 20-100 MCG/ACT AERS respimat Inhale 1 puff into the lungs every 4 (four) hours as needed for wheezing or shortness of breath. 08/07/21  Yes Kara Dorn NOVAK, MD  isosorbide  mononitrate (IMDUR ) 30 MG 24 hr tablet Take 30 mg by mouth in the morning and at bedtime. 09/24/14  Yes [provider]  levothyroxine  (SYNTHROID ) 50 MCG tablet Take 50 mcg by mouth daily before breakfast.   Yes [provider]  lisinopril  (PRINIVIL ,ZESTRIL ) 40 MG tablet Take 40 mg by mouth daily.   Yes [provider]  LORazepam  (ATIVAN ) 0.5 MG tablet Take 0.5-0.75 mg by mouth every 6 (six) hours as needed for anxiety.   Yes [provider]  MUCINEX  600 MG 12 hr tablet Take 600 mg by mouth 2 (two) times daily as needed for to loosen phlegm.   Yes [provider]   ondansetron  (ZOFRAN -ODT) 4 MG disintegrating tablet Take 1 tablet (4 mg total) by mouth every 8 (eight) hours as needed for nausea or vomiting. Patient taking differently: Take 4 mg by mouth every 8 (eight) hours as needed for nausea or vomiting (dissolve orally). 09/29/24  Yes Logan Ubaldo NOVAK, PA-C  pantoprazole  (PROTONIX ) 40 MG tablet Take 40 mg by mouth daily before breakfast.   Yes [provider]  simvastatin  (ZOCOR ) 20 MG tablet Take 40 mg by mouth at bedtime. 06/06/22  Yes [provider]  HYDROcodone  bit-homatropine (HYCODAN) 5-1.5 MG/5ML syrup Take 5 mLs by  mouth every 6 (six) hours as needed for cough. Patient not taking: Reported on 10/05/2024 09/30/24   Kara Dorn NOVAK, MD  traMADol  (ULTRAM ) 50 MG tablet Take 1 tablet (50 mg total) by mouth every 6 (six) hours as needed. Patient not taking: Reported on 10/05/2024 08/17/24   Bari Charmaine FALCON, MD     Vitals:   10/05/24 0502 10/05/24 1000 10/05/24 1039 10/05/24 1200  BP: 121/83   (!) 121/57  Pulse: 80   66  Resp: 17 15  16   Temp: 97.7 F (36.5 C)   98.7 F (37.1 C)  TempSrc: Oral     SpO2: 100%  99% 96%  Weight:      Height:       Exam Gen alert, no distress No rash, cyanosis or gangrene Sclera anicteric, throat clear  No jvd or bruits Chest clear bilat to bases, no rales/ wheezing RRR no MRG Abd soft ntnd no mass or ascites +bs GU deferred MS no joint effusions or deformity Ext no LE or UE edema, no other edema Neuro is alert, Ox 3 , nf    Home meds of interest: Norvasc  Atenolol  Oplavix Cymbalta  Imdur  T4 Lisinopril   Ativan  prn  Protonix  Zocor   BP's have been wnl here UA- negative UNa on 12/14= < 30 UOsm on 12/14 = 327    Assessment/ Plan: Hyponatremia: Serum sodium 118 on arrival 2 days ago in the setting of a recent COVID infection at home.  During that time she ate very little and drank a lot of water  every day.  Patient also has mild CKD, and hypertension on 3  different agents.  She was treated with normal saline and sodium improved to 126 last evening, but today has stalled out at 126 and not getting better with further isotonic fluids.  On exam she is looks euvolemic, no history of heart or liver failure, no edema on exam.  Suspect she had combined volume depletion and SIADH picture possibly related to a viral lung infection.  Will switch over to SIADH management, will repeat urine sodium and urine osmolarity and stop isotonic fluids, start 1000 cc fluid restriction and start salt tablets 2 g 3 times daily.  Will follow COVID infection: Mostly resolved per the patient. onset was about a week and a half ago. Hypertension: Takes Norvasc , atenolol  and lisinopril  at home.  Lisinopril  is on hold and blood pressures are stable. CKD 3a: Followed by [facility], baseline creatinine appears to be 1.1-1.4.  Stable creatinine here, follow.     Myer Fret  MD [facility] 10/05/2024, 6:04 PM Recent Labs  Lab 10/03/24 1704 10/03/24 2157 10/04/24 0300 10/04/24 0845 10/05/24 0849 10/05/24 1435  HGB 11.9*  --  11.8*  --   --   --   ALBUMIN   --   --  3.7  --   --   --   CALCIUM   --    < > 9.3   < > 9.5 9.1  CREATININE 1.30*   < > 1.14*   < > 1.28* 1.24*  K 4.3   < > 4.4   < > 4.1 4.2   < > = values in this interval not displayed.   Recent Labs  Lab 10/05/24 0849 10/05/24 1435  CREATININE 1.28* 1.24*  K 4.1 4.2   Inpatient medications:  amLODipine   5 mg Oral Daily   atenolol   50 mg Oral Daily   atorvastatin   20 mg Oral q1800   budesonide -glycopyrrolate -formoterol   2 puff Inhalation  BID   clopidogrel   75 mg Oral Daily   DULoxetine   60 mg Oral Daily   enoxaparin  (LOVENOX ) injection  40 mg Subcutaneous Q24H   [START ON 10/06/2024] ferrous sulfate   325 mg Oral Q breakfast   levothyroxine   50 mcg Oral Q0600   pantoprazole   40 mg Oral BID    sodium chloride  Stopped (10/05/24 1517)   acetaminophen  **OR** acetaminophen , albuterol , fluticasone ,  guaiFENesin -dextromethorphan , HYDROcodone  bit-homatropine, LORazepam , ondansetron  **OR** ondansetron  (ZOFRAN ) IV      [1]  Allergies Allergen Reactions   Azithromycin  Other (See Comments)    Pt states she cannot take this due to her heart medication and heart condition   Carvedilol Palpitations   Codeine  Shortness Of Breath   Buspirone Other (See Comments)    Did not work   Dapsone Other (See Comments)    Flushing    Niacin And Related Other (See Comments)    Flushing    Paroxetine Hcl Other (See Comments)    Dizziness and dreams   Septra [Sulfamethoxazole-Trimethoprim] Diarrhea and Nausea And Vomiting   Sertraline Hcl Other (See Comments)    Reported skin crawling and a fuzzy head   Amoxicillin Rash and Other (See Comments)    FLUSHED FEELING   Wellbutrin [Bupropion] Anxiety and Other (See Comments)    Caused the patient to feel jittery

## 2024-10-05 NOTE — Progress Notes (Signed)
°   10/05/24 0923  TOC Brief Assessment  Insurance and Status Reviewed  Patient has primary care physician Yes  Home environment has been reviewed single family home  Prior level of function: independent  Prior/Current Home Services No current home services  Social Drivers of Health Review SDOH reviewed no interventions necessary  Readmission risk has been reviewed Yes  Transition of care needs no transition of care needs at this time    Signed: Heather Saltness, MSW, LCSW Clinical Social Worker Inpatient Care Management 10/05/2024 9:23 AM

## 2024-10-05 NOTE — Telephone Encounter (Signed)
 Copied from CRM #[id number]. Topic: Clinical - Medical Advice >> Sep 30, 2024 12:31 PM Joesph PARAS wrote: Reason for CRM: Patient has additional question following speaking to clinical staff. Patient is inquiring the significance of oxygen leves in their discussion. Please return call to patient.   This has already been address

## 2024-10-05 NOTE — Telephone Encounter (Signed)
 NFN

## 2024-10-06 DIAGNOSIS — U071 COVID-19: Secondary | ICD-10-CM | POA: Diagnosis present

## 2024-10-06 DIAGNOSIS — E6609 Other obesity due to excess calories: Secondary | ICD-10-CM

## 2024-10-06 DIAGNOSIS — E871 Hypo-osmolality and hyponatremia: Secondary | ICD-10-CM | POA: Diagnosis not present

## 2024-10-06 LAB — BASIC METABOLIC PANEL WITH GFR
Anion gap: 9 (ref 5–15)
Anion gap: 10 (ref 5–15)
BUN: 16 mg/dL (ref 8–23)
BUN: 16 mg/dL (ref 8–23)
CO2: 23 mmol/L (ref 22–32)
CO2: 21 mmol/L — ABNORMAL LOW (ref 22–32)
Calcium: 9.3 mg/dL (ref 8.9–10.3)
Calcium: 9.2 mg/dL (ref 8.9–10.3)
Chloride: 96 mmol/L — ABNORMAL LOW (ref 98–111)
Chloride: 96 mmol/L — ABNORMAL LOW (ref 98–111)
Creatinine, Ser: 1.26 mg/dL — ABNORMAL HIGH (ref 0.44–1.00)
Creatinine, Ser: 1.25 mg/dL — ABNORMAL HIGH (ref 0.44–1.00)
GFR, Estimated: 45 mL/min — ABNORMAL LOW (ref >60)
GFR, Estimated: 45 mL/min — ABNORMAL LOW (ref >60)
Glucose, Bld: 130 mg/dL — ABNORMAL HIGH (ref 70–99)
Glucose, Bld: 149 mg/dL — ABNORMAL HIGH (ref 70–99)
Potassium: 4.3 mmol/L (ref 3.5–5.1)
Potassium: 4.2 mmol/L (ref 3.5–5.1)
Sodium: 128 mmol/L — ABNORMAL LOW (ref 135–145)
Sodium: 128 mmol/L — ABNORMAL LOW (ref 135–145)

## 2024-10-06 LAB — SODIUM
Sodium: 127 mmol/L — ABNORMAL LOW (ref 135–145)
Sodium: 126 mmol/L — ABNORMAL LOW (ref 135–145)

## 2024-10-06 LAB — OSMOLALITY, URINE: Osmolality, Ur: 509 mosm/kg (ref 300–900)

## 2024-10-06 MED ORDER — DOCUSATE SODIUM 100 MG PO CAPS
100.0000 mg | ORAL_CAPSULE | Freq: Two times a day (BID) | ORAL | Status: DC
  Administered 2024-10-06 – 2024-10-08 (×5): 100 mg via ORAL
  Filled 2024-10-06 (×5): qty 1

## 2024-10-06 MED ORDER — SODIUM CHLORIDE 1 G PO TABS
2.0000 g | ORAL_TABLET | Freq: Three times a day (TID) | ORAL | Status: DC
  Administered 2024-10-07 – 2024-10-08 (×5): 2 g via ORAL
  Filled 2024-10-06 (×5): qty 2

## 2024-10-06 MED ORDER — SODIUM CHLORIDE 1 G PO TABS
2.0000 g | ORAL_TABLET | Freq: Four times a day (QID) | ORAL | Status: AC
  Administered 2024-10-06 (×2): 2 g via ORAL
  Filled 2024-10-06 (×3): qty 2

## 2024-10-06 MED ORDER — BISACODYL 5 MG PO TBEC: 5.0000 mg | DELAYED_RELEASE_TABLET | Freq: Every day | ORAL | Status: DC | PRN

## 2024-10-06 MED ORDER — POLYETHYLENE GLYCOL 3350 17 G PO PACK
17.0000 g | PACK | Freq: Every day | ORAL | Status: DC
  Administered 2024-10-06 – 2024-10-08 (×3): 17 g via ORAL
  Filled 2024-10-06 (×3): qty 1

## 2024-10-06 NOTE — Assessment & Plan Note (Addendum)
-  Appears to be stable at this time -Attempt to avoid nephrotoxic medications -Recheck BMP in AM  

## 2024-10-06 NOTE — Assessment & Plan Note (Addendum)
 Body mass index is 31.04 kg/m.SABRA  Weight loss should be encouraged Outpatient PCP/bariatric medicine f/u encouraged Significantly low or high BMI is associated with higher medical risk including morbidity and mortality

## 2024-10-06 NOTE — Assessment & Plan Note (Addendum)
 Continue Breztri , flonase  and albuterol  nebs as needed Stable on RA at this time

## 2024-10-06 NOTE — Assessment & Plan Note (Addendum)
 Continue Synthroid 

## 2024-10-06 NOTE — Hospital Course (Signed)
 73yo with h/o normocytic anemia, anxiety/depression, HTN, HLD, PVD, preDM, CVA, and COPD who presented on 12/14 with SOB in the setting of COVID infection.  Severe hyponatremia noted on presentation.  Free water  restricted with some improvement.  Nephrology consulted.

## 2024-10-06 NOTE — Assessment & Plan Note (Signed)
 Continue Plavix.  ?

## 2024-10-06 NOTE — Assessment & Plan Note (Addendum)
 Resume amlodipine , atenolol  Lisinopril  is on hold for now and blood pressures are stable

## 2024-10-06 NOTE — Plan of Care (Signed)

## 2024-10-06 NOTE — Assessment & Plan Note (Addendum)
 Completed Paxlovid Mostly resolved No longer on precautions

## 2024-10-06 NOTE — Assessment & Plan Note (Addendum)
 Diet controlled Glucose in the low 100s

## 2024-10-06 NOTE — Progress Notes (Addendum)
 [facility] Progress Note  Subjective:  Seen in room Na+ up to 128 UNa up to 104 (was previously < 30)  Presentation summary: 73 y.o. year-old w/ PMH as below who presented on 10/03/2024 coming from home reporting chest pain on inspiration and shortness of breath due to pain.  EMS heard some wheezing and gave her DuoNeb and route.  In ED BP 140/78, heart rate 60s, RR 18, 98% on room air.  Sodium was 118, CO2 22, BUN 20 and creatinine 1.31.  proBNP 532.  WBC 14K, Hgb 12.8.  Patient was admitted for hyponatremia.  She was given normal saline at 125 cc an hour on 12/14 and 12/15 with improvement of serum sodium up to 126 yesterday evening.  However it is now stalled at 126 and is not getting any better or worse with IV fluids.  We are asked to see for hyponatremia.  Pt seen in room.  She states he had COVID about 9 days ago and went most of that time without eating much solid food, just drinking lots of water .  Denies any history of sodium issues.  She is followed by [facility] for mild CKD and she is seen about once a year.  Vitals:   10/05/24 2003 10/05/24 2005 10/06/24 0511 10/06/24 1218  BP: 137/73  (!) 142/77 130/66  Pulse: 65  79 75  Resp: 18  18 15   Temp: 98.1 F (36.7 C)  98.4 F (36.9 C) 98.2 F (36.8 C)  TempSrc: Oral     SpO2: 100% 100% 98% 96%  Weight:      Height:        Exam: Gen alert, no distress No jvd or bruits Chest clear bilat to bases RRR no MRG Abd soft ntnd no mass or ascites +bs Ext no LE or UE edema Neuro is alert, Ox 3 , nf       Home meds of interest: Norvasc  Atenolol  Oplavix Cymbalta  Imdur  T4 Lisinopril   Ativan  prn  Protonix  Zocor    BP's have been wnl here UA- negative UNa on 12/14= < 30 UOsm on 12/14 = 327      Assessment/ Plan: Hyponatremia: Serum sodium 118 on arrival 2 days ago in the setting of a recent COVID infection at home.  During that time she ate very little and drank a lot of water  every day.  Patient  also has mild CKD, and hypertension on 3 different agents.  She was treated with normal saline and sodium improved to 126 last evening, but today has stalled out at 126 and not getting better with further isotonic fluids.  On exam she is looks euvolemic, no history of heart or liver failure, no edema on exam.  Suspect she had combined volume depletion and SIADH picture (d/t viral lung infection).  Repeat UNa sodium is high and confirms SIADH type picture.  Na+ up to 128 today, will cont 1000 cc fluid restriction and continue NaCl tabs 2 g 3 times daily. If Na+ improves to 129 or higher later today or tomorrow morning, she can be dc'd home. Upon dc would give her NaCl at lower dose of 1 gm tid x 7 days, + fluid restriction 1000 cc/ day for another 7 days.  Then she should see her PCP in 7-10 days for f/u labs to see if this has resolved. Will follow.  COVID infection: Mostly resolved per the patient. Onset was about a week and a half ago. Hypertension: Takes Norvasc , atenolol  and lisinopril  at home.  Lisinopril  is on hold and blood pressures are stable. I would keep the ACEi off for about 1 month until the SIADH has resolved as this can be troublesome while trying to correct.  CKD 3a: Followed by [facility], baseline creatinine appears to be 1.1-1.4.  Stable creatinine here, follow.    Myer Fret MD  [facility] 10/06/2024, 2:54 PM  Recent Labs  Lab 10/03/24 1704 10/03/24 2157 10/04/24 0300 10/04/24 0845 10/06/24 0623 10/06/24 0903  HGB 11.9*  --  11.8*  --   --   --   ALBUMIN   --   --  3.7  --   --   --   CALCIUM   --    < > 9.3   < > 9.3 9.2  CREATININE 1.30*   < > 1.14*   < > 1.26* 1.25*  K 4.3   < > 4.4   < > 4.3 4.2   < > = values in this interval not displayed.   No results for input(s): IRON, TIBC, FERRITIN in the last 168 hours. Inpatient medications:  amLODipine   5 mg Oral Daily   atenolol   50 mg Oral Daily   atorvastatin   20 mg Oral q1800    budesonide -glycopyrrolate -formoterol   2 puff Inhalation BID   clopidogrel   75 mg Oral Daily   docusate sodium   100 mg Oral BID   DULoxetine   60 mg Oral Daily   enoxaparin  (LOVENOX ) injection  40 mg Subcutaneous Q24H   ferrous sulfate   325 mg Oral Q breakfast   levothyroxine   50 mcg Oral Q0600   pantoprazole   40 mg Oral BID   polyethylene glycol  17 g Oral Daily    acetaminophen  **OR** acetaminophen , albuterol , bisacodyl , fluticasone , guaiFENesin -dextromethorphan , HYDROcodone  bit-homatropine, LORazepam , ondansetron  **OR** ondansetron  (ZOFRAN ) IV

## 2024-10-06 NOTE — Progress Notes (Signed)
 Progress Note   Patient: Shannon Roberts FMW:[id number] DOB: [birth year] DOA: 10/03/2024     2 DOS: the patient was seen and examined on 10/06/2024   Brief hospital course: 73yo with h/o normocytic anemia, anxiety/depression, HTN, HLD, PVD, preDM, CVA, and COPD who presented on 12/14 with SOB in the setting of COVID infection.  Severe hyponatremia noted on presentation.  Free water  restricted with some improvement.  Nephrology consulted.     Assessment & Plan Hyponatremia Na++ 118 on presentation Likely from copious water  intake with recent COVID Has been on free water  restriction Patient is feeling much better but Na++ has stalled at [facility] nephrology Dr. Geralynn  Switched over to SIADH management - stop isotonic fluids, start 1000 cc fluid restriction and start salt tablets 2 g 3 times daily Recheck BMP in AM GERD (gastroesophageal reflux disease) Continue PPI COPD, severe (HCC) Continue Breztri , flonase  and albuterol  nebs as needed Stable on RA at this time HTN (hypertension), malignant Resume amlodipine , atenolol  Lisinopril  is on hold for now and blood pressures are stable Hyperlipidemia Continue simvastatin  Pre-diabetes Diet controlled Glucose in the low 100s Hypothyroidism Continue Synthroid  History of CVA (cerebrovascular accident) Continue Plavix  COVID-19 virus infection Completed Paxlovid Mostly resolved No longer on precautions Chronic kidney disease, stage 3a (HCC) Appears to be stable at this time Attempt to avoid nephrotoxic medications Recheck BMP in AM  Class 1 obesity due to excess calories with body mass index (BMI) of 31.0 to 31.9 in adult Body mass index is 31.04 kg/m.SABRA  Weight loss should be encouraged Outpatient PCP/bariatric medicine f/u encouraged Significantly low or high BMI is associated with higher medical risk including morbidity and mortality       Consultants: Nephrology Inpatient case  management  Procedures: None  Antibiotics: None  30 Day Unplanned Readmission Risk Score    Flowsheet Row ED to Hosp-Admission (Current) from 10/03/2024 in [facility]  30 Day Unplanned Readmission Risk Score (%) 19.7 Filed at 10/06/2024 0801    This score is the patient's risk of an unplanned readmission within 30 days of being discharged (0 -100%). The score is based on dignosis, age, lab data, medications, orders, and past utilization.   Low:  0-14.9   Medium: 15-21.9   High: 22-29.9   Extreme: 30 and above           Subjective: Anxious but otherwise no significant concerns.  Responded well to reassurance.   Objective: Vitals:   10/06/24 1218 10/06/24 1758  BP: 130/66 (!) 140/68  Pulse: 75 84  Resp: 15 17  Temp: 98.2 F (36.8 C) 98 F (36.7 C)  SpO2: 96% 98%    Intake/Output Summary (Last 24 hours) at 10/06/2024 1852 Last data filed at 10/06/2024 1700 Gross per 24 hour  Intake 1205.65 ml  Output 700 ml  Net 505.65 ml   Filed Weights   10/03/24 1229 10/03/24 1251 10/03/24 2200  Weight: 86.2 kg 86.2 kg 84.6 kg    Exam:  General:  Appears calm and comfortable and is in NAD, on neb treatment Eyes:  normal lids, iris ENT:  grossly normal hearing, lips & tongue, mmm Cardiovascular:  RRR. No LE edema.  Respiratory:   CTA bilaterally with no wheezes/rales/rhonchi, globally decreased air movement.  Normal respiratory effort. Abdomen:  soft, NT, ND Skin:  no rash or induration seen on limited exam Musculoskeletal:  grossly normal tone BUE/BLE, good ROM, no bony abnormality Psychiatric:  anxious mood and affect, speech fluent  and appropriate, AOx3 Neurologic:  CN 2-12 grossly intact, moves all extremities in coordinated fashion  Data Reviewed: I have reviewed the patient's lab results since admission.  Pertinent labs for today include:   Na++ 128 CO2 21 Glucose 149 BUN 16/Creatinine 1.25/GFR 45     Family  Communication: None present     Code Status: Full Code    Disposition: Status is: Inpatient Remains inpatient appropriate because: ongoing monitoring     Time spent: 50 minutes  Unresulted Labs (From admission, onward)     Start     Ordered   10/07/24 0900  Sodium  Now then every 6 hours,   R     Question:  Specimen collection method  Answer:  Lab=Lab collect   10/06/24 1506   10/07/24 0500  Basic metabolic panel with GFR  Tomorrow morning,   R       Question:  Specimen collection method  Answer:  Lab=Lab collect   10/06/24 1506   10/06/24 1505  Sodium  Now then every 6 hours,   R (with TIMED occurrences)     Question:  Specimen collection method  Answer:  Lab=Lab collect   10/06/24 1506             Author: Delon Herald, MD 10/06/2024 6:52 PM  For on call review [email or web address].

## 2024-10-06 NOTE — Assessment & Plan Note (Addendum)
 Na++ 118 on presentation Likely from copious water  intake with recent COVID Has been on free water  restriction Patient is feeling much better but Na++ has stalled at [facility] nephrology Dr. Geralynn  Switched over to SIADH management - stop isotonic fluids, start 1000 cc fluid restriction and start salt tablets 2 g 3 times daily Recheck BMP in AM

## 2024-10-06 NOTE — Assessment & Plan Note (Addendum)
 Continue PPI.

## 2024-10-06 NOTE — Assessment & Plan Note (Addendum)
 Continue simvastatin .

## 2024-10-07 ENCOUNTER — Telehealth: Payer: Self-pay

## 2024-10-07 DIAGNOSIS — Z87891 Personal history of nicotine dependence: Secondary | ICD-10-CM

## 2024-10-07 DIAGNOSIS — J441 Chronic obstructive pulmonary disease with (acute) exacerbation: Secondary | ICD-10-CM

## 2024-10-07 DIAGNOSIS — E871 Hypo-osmolality and hyponatremia: Secondary | ICD-10-CM | POA: Diagnosis not present

## 2024-10-07 DIAGNOSIS — R262 Difficulty in walking, not elsewhere classified: Secondary | ICD-10-CM | POA: Insufficient documentation

## 2024-10-07 LAB — BASIC METABOLIC PANEL WITH GFR
Anion gap: 12 (ref 5–15)
BUN: 18 mg/dL (ref 8–23)
CO2: 20 mmol/L — ABNORMAL LOW (ref 22–32)
Calcium: 9.8 mg/dL (ref 8.9–10.3)
Chloride: 98 mmol/L (ref 98–111)
Creatinine, Ser: 1.29 mg/dL — ABNORMAL HIGH (ref 0.44–1.00)
GFR, Estimated: 43 mL/min — ABNORMAL LOW (ref >60)
Glucose, Bld: 125 mg/dL — ABNORMAL HIGH (ref 70–99)
Potassium: 4.7 mmol/L (ref 3.5–5.1)
Sodium: 130 mmol/L — ABNORMAL LOW (ref 135–145)

## 2024-10-07 LAB — SODIUM: Sodium: 128 mmol/L — ABNORMAL LOW (ref 135–145)

## 2024-10-07 MED ORDER — HYDRALAZINE HCL 20 MG/ML IJ SOLN
5.0000 mg | Freq: Once | INTRAMUSCULAR | Status: AC
  Administered 2024-10-07: 06:00:00 5 mg via INTRAVENOUS
  Filled 2024-10-07: qty 1

## 2024-10-07 MED ORDER — BISACODYL 10 MG RE SUPP
10.0000 mg | Freq: Once | RECTAL | Status: AC
  Administered 2024-10-08: 10 mg via RECTAL
  Filled 2024-10-07: qty 1

## 2024-10-07 MED ORDER — LORAZEPAM 0.5 MG PO TABS
0.5000 mg | ORAL_TABLET | Freq: Four times a day (QID) | ORAL | Status: DC
  Administered 2024-10-07 – 2024-10-08 (×4): 0.5 mg via ORAL
  Filled 2024-10-07 (×4): qty 1

## 2024-10-07 NOTE — Telephone Encounter (Signed)
 Oupt pulm appnt requested.

## 2024-10-07 NOTE — Assessment & Plan Note (Deleted)
 Resume amlodipine , atenolol  Hold lisinopril  for at least 1 month

## 2024-10-07 NOTE — Assessment & Plan Note (Deleted)
 Continue Plavix.  ?

## 2024-10-07 NOTE — Assessment & Plan Note (Addendum)
 Resume amlodipine , atenolol  Lisinopril  is on hold for at least 1 month, per nephrology

## 2024-10-07 NOTE — Progress Notes (Signed)
 [facility] Progress Note  Subjective:  Seen in room Na+ up to 130, then 128   Presentation summary: 73 y.o. year-old w/ PMH as below who presented on 10/03/2024 coming from home reporting chest pain on inspiration and shortness of breath due to pain.  EMS heard some wheezing and gave her DuoNeb and route.  In ED BP 140/78, heart rate 60s, RR 18, 98% on room air.  Sodium was 118, CO2 22, BUN 20 and creatinine 1.31.  proBNP 532.  WBC 14K, Hgb 12.8.  Patient was admitted for hyponatremia.  She was given normal saline at 125 cc an hour on 12/14 and 12/15 with improvement of serum sodium up to 126 yesterday evening.  However it is now stalled at 126 and is not getting any better or worse with IV fluids.  We are asked to see for hyponatremia.  Pt seen in room.  She states he had COVID about 9 days ago and went most of that time without eating much solid food, just drinking lots of water .  Denies any history of sodium issues.  She is followed by [facility] for mild CKD and she is seen about once a year.  Vitals:   10/07/24 0500 10/07/24 0516 10/07/24 0833 10/07/24 1226  BP:  (!) 165/101  124/70  Pulse:  (!) 109  93  Resp:  18  (!) 21  Temp:  98.5 F (36.9 C)  98.4 F (36.9 C)  TempSrc:      SpO2:  97% 97% 95%  Weight: 85.2 kg     Height:        Exam: Gen alert, no distress No jvd or bruits Chest clear bilat to bases RRR no MRG Abd soft ntnd no mass or ascites +bs Ext no LE or UE edema Neuro is alert, Ox 3 , nf       Home meds of interest: Norvasc  Atenolol  Oplavix Cymbalta  Imdur  T4 Lisinopril   Ativan  prn  Protonix  Zocor    BP's have been wnl here UA- negative UNa on 12/14= < 30 UOsm on 12/14 = 327      Assessment/ Plan: Hyponatremia: Serum sodium 118 on arrival 2 days ago in the setting of a recent COVID infection at home.  During that time she ate very little and drank a lot of water  every day.  Patient also has mild CKD, and hypertension on 3  different agents.  She was treated with normal saline and sodium improved to 126, but then stalled at 126. By exam she looked euvolemic. This hyponatremia was due to a combination of volume depletion (rx'd w/ isotonic IVF's) and now SIADH picture (d/t viral lung infection) being rx'd w/ fluid restriction and salt tablets. Repeat UNa sodium was high confirming SIADH type.  Na+ was up to 130 this am. Have dc'd q 6 Na+ levels and ordered bmet for am tomorrow. Ok for dc from renal standpoint, have d/w pmd. Will sign off.  - upon dc would lower NaCl to 1 gm tid and take for 7 days - cont fluid restriction 1000 cc/ day also for 7 days - should get labs per PCP in 7-10 days - hold off on ACEi/ ARB for 3-4 wks please  COVID infection: Mostly resolved per the patient. Onset was about a week and a half ago. Hypertension: Takes Norvasc , atenolol  and lisinopril  at home.  Lisinopril  is on hold and blood pressures are stable. Holding acei as above.  CKD 3a: Followed by [facility], baseline creatinine  appears to be 1.1-1.4.  Stable creatinine here.     Myer Fret MD  [facility] 10/07/2024, 2:26 PM  Recent Labs  Lab 10/03/24 1704 10/03/24 2157 10/04/24 0300 10/04/24 0845 10/06/24 0903 10/07/24 0550  HGB 11.9*  --  11.8*  --   --   --   ALBUMIN   --   --  3.7  --   --   --   CALCIUM   --    < > 9.3   < > 9.2 9.8  CREATININE 1.30*   < > 1.14*   < > 1.25* 1.29*  K 4.3   < > 4.4   < > 4.2 4.7   < > = values in this interval not displayed.   No results for input(s): IRON, TIBC, FERRITIN in the last 168 hours. Inpatient medications:  amLODipine   5 mg Oral Daily   atenolol   50 mg Oral Daily   atorvastatin   20 mg Oral q1800   budesonide -glycopyrrolate -formoterol   2 puff Inhalation BID   clopidogrel   75 mg Oral Daily   docusate sodium   100 mg Oral BID   DULoxetine   60 mg Oral Daily   enoxaparin  (LOVENOX ) injection  40 mg Subcutaneous Q24H   ferrous sulfate   325 mg Oral Q breakfast    levothyroxine   50 mcg Oral Q0600   pantoprazole   40 mg Oral BID   polyethylene glycol  17 g Oral Daily   sodium chloride   2 g Oral TID WC    acetaminophen  **OR** acetaminophen , albuterol , bisacodyl , fluticasone , guaiFENesin -dextromethorphan , HYDROcodone  bit-homatropine, LORazepam , ondansetron  **OR** ondansetron  (ZOFRAN ) IV

## 2024-10-07 NOTE — Assessment & Plan Note (Addendum)
 Body mass index is 31.26 kg/m.SABRA  Weight loss should be encouraged Outpatient PCP/bariatric medicine f/u encouraged Significantly low or high BMI is associated with higher medical risk including morbidity and mortality

## 2024-10-07 NOTE — Assessment & Plan Note (Addendum)
-  Appears to be stable at this time -Attempt to avoid nephrotoxic medications

## 2024-10-07 NOTE — Assessment & Plan Note (Deleted)
 Diet controlled Glucose in the low 100s

## 2024-10-07 NOTE — Assessment & Plan Note (Deleted)
 Completed Paxlovid Mostly resolved No longer on precautions

## 2024-10-07 NOTE — Assessment & Plan Note (Deleted)
 Body mass index is 31.26 kg/m.SABRA  Weight loss should be encouraged Outpatient PCP/bariatric medicine f/u encouraged Significantly low or high BMI is associated with higher medical risk including morbidity and mortality

## 2024-10-07 NOTE — Assessment & Plan Note (Addendum)
 Completed Paxlovid Mostly resolved No longer on precautions

## 2024-10-07 NOTE — Plan of Care (Signed)

## 2024-10-07 NOTE — Assessment & Plan Note (Addendum)
 Na++ 118 on presentation Likely from copious water  intake with recent COVID Has been on free water  restriction Patient is feeling much better but Na++ stalled at [facility] nephrology Dr. Geralynn  Thought to have a combination of both volume depletion and SIADH at the time of admission due to viral illness Switched over to SIADH management - stopped isotonic fluids, started 1000 cc fluid restriction and salt tablets  Na++ is up to 130 this AM and patient can be discharged per nephrology She is recommended to discharge on NaCl 1 gram TID x 7 days with fluid restriction of 1000cc/day x 7 days Will need PCP follow up with repeat labs in 7-10 days

## 2024-10-07 NOTE — Assessment & Plan Note (Deleted)
 Continue PPI.

## 2024-10-07 NOTE — Assessment & Plan Note (Deleted)
 Continue simvastatin .

## 2024-10-07 NOTE — Consult Note (Addendum)
 NAME:  Shannon Roberts, MRN:  [id number], DOB:  [birth year], LOS: 3 ADMISSION DATE:  10/03/2024 CHIEF COMPLAINT:  COPD exac   REFERRING MD :  Dr Barbarann  History of Present Illness:  73 year old female with anemia, anxiety/depression, hypertension, hyperlipidemia, CVA, severe COPD presents on 12/14 with shortness of breath.  Patient was positive for COVID.  In addition she had hyponatremia.  Pulmonary is being consulted at the request of family for further recommendations. Follows Dr. Kara.  Last seen on 08/11/2024; PFT 2018: Moderate-severe obstruction with air trapping and moderate decrease in DLCO.  Echo 2022: Grade 1 diastolic dysfunction.  Stable and was advised to continue with Trelegy Ellipta  along with Combivent . Tested positive for COVID on 12/4.  Was given Paxlovid.  Also started on doxycycline  and prednisone . Subsequently presented to the hospital on 10/03/2024 for worsening symptoms and hyponatremia.  Hyponatremia is being managed by primary team.  Her symptoms have improved without further prednisone .  She has not been on prednisone  during the hospitalization.  Has been on room air during the hospitalization.  Talking to the patient she says she feels much better than when she was diagnosed with COVID.  She used total of 40 mg prednisone  which she was prescribed for 5 days, over a period of 7-8 days as per the guidance of her physicians.  She now has improved cough without any phlegm.  Previously she has greenish cough.  Chest x-ray on admission without infiltrates.  70-pack-year smoking history and quit in 2015.  Interim History / Subjective:  See above.   Significant Hospital Events: 12/18 pulm consulted.  PAST MEDICAL HISTORY :   has a past medical history of Allergic rhinitis, Anemia, Anxiety, Colitis, COPD (chronic obstructive pulmonary disease) (HCC), Depression, Diverticulosis, Female bladder prolapse, Fibroids, GERD (gastroesophageal reflux disease), Heart murmur,  adenomatous colonic polyps (08/26/2019), Hyperlipemia, Hypertension, IBS (irritable bowel syndrome), Insomnia, Lip abscess (04/05/2016), Mitral valve prolapse, Peripheral vascular disease, Pre-diabetes (08/03/2019), Right bundle branch block, Sciatic nerve disease, Shortness of breath, and Stroke [facility]) (March 2015).  has a past surgical history that includes Cholecystectomy; Endarterectomy (Right, 01/17/2014); Carotid endarterectomy (Right, January 17, 2014); Colonoscopy; and Upper gastrointestinal endoscopy. Prior to Admission medications  Medication Sig Start Date End Date Taking? Authorizing Provider  albuterol  (PROVENTIL ) (2.5 MG/3ML) 0.083% nebulizer solution Take 3 mLs (2.5 mg total) by nebulization every 6 (six) hours as needed for wheezing or shortness of breath. 07/01/24  Yes Kara Dorn NOVAK, MD  amLODipine  (NORVASC ) 5 MG tablet Take 5 mg by mouth daily. 03/11/21  Yes [provider]  atenolol  (TENORMIN ) 50 MG tablet Take 50 mg by mouth daily.   Yes [provider]  Cholecalciferol (VITAMIN D3) 5000 units CAPS Take 5,000 Units by mouth See admin instructions. Take 5,000 units by mouth on Saturdays and Sundays   Yes [provider]  clopidogrel  (PLAVIX ) 75 MG tablet Take 75 mg by mouth daily.    Yes [provider]  DULoxetine  (CYMBALTA ) 30 MG capsule Take 60 mg by mouth in the morning.   Yes [provider]  Evolocumab  (REPATHA  SURECLICK) 140 MG/ML SOAJ Inject 140 mg into the skin every 14 (fourteen) days. INJECT UNDER THE SKIN EVERY 14 DAYS. Patient taking differently: Inject 140 mg into the skin every 14 (fourteen) days. 06/08/24  Yes O'Neal, Darryle Ned, MD  ferrous sulfate  325 (65 FE) MG tablet Take 325 mg by mouth daily with breakfast.   Yes [provider]  fluticasone  (FLONASE ) 50 MCG/ACT nasal spray  Place 1 spray into both nostrils 2 (two) times daily as needed for allergies or rhinitis.   Yes [provider]   Fluticasone -Umeclidin-Vilant (TRELEGY ELLIPTA ) 200-62.5-25 MCG/ACT AEPB Inhale 1 puff into the lungs daily. 04/22/24  Yes Hope Almarie ORN, NP  Ipratropium-Albuterol  (COMBIVENT  RESPIMAT) 20-100 MCG/ACT AERS respimat Inhale 1 puff into the lungs every 4 (four) hours as needed for wheezing or shortness of breath. 08/07/21  Yes Kara Dorn NOVAK, MD  isosorbide  mononitrate (IMDUR ) 30 MG 24 hr tablet Take 30 mg by mouth in the morning and at bedtime. 09/24/14  Yes [provider]  levothyroxine  (SYNTHROID ) 50 MCG tablet Take 50 mcg by mouth daily before breakfast.   Yes [provider]  lisinopril  (PRINIVIL ,ZESTRIL ) 40 MG tablet Take 40 mg by mouth daily.   Yes [provider]  LORazepam  (ATIVAN ) 0.5 MG tablet Take 0.5-0.75 mg by mouth every 6 (six) hours as needed for anxiety.   Yes [provider]  MUCINEX  600 MG 12 hr tablet Take 600 mg by mouth 2 (two) times daily as needed for to loosen phlegm.   Yes [provider]  ondansetron  (ZOFRAN -ODT) 4 MG disintegrating tablet Take 1 tablet (4 mg total) by mouth every 8 (eight) hours as needed for nausea or vomiting. Patient taking differently: Take 4 mg by mouth every 8 (eight) hours as needed for nausea or vomiting (dissolve orally). 09/29/24  Yes Logan Ubaldo NOVAK, PA-C  pantoprazole  (PROTONIX ) 40 MG tablet Take 40 mg by mouth daily before breakfast.   Yes [provider]  simvastatin  (ZOCOR ) 20 MG tablet Take 40 mg by mouth at bedtime. 06/06/22  Yes [provider]  HYDROcodone  bit-homatropine (HYCODAN) 5-1.5 MG/5ML syrup Take 5 mLs by mouth every 6 (six) hours as needed for cough. Patient not taking: Reported on 10/05/2024 09/30/24   Kara Dorn NOVAK, MD  traMADol  (ULTRAM ) 50 MG tablet Take 1 tablet (50 mg total) by mouth every 6 (six) hours as needed. Patient not taking: Reported on 10/05/2024 08/17/24   Horton, Charmaine FALCON, MD    FAMILY HISTORY:  family history includes AAA (abdominal  aortic aneurysm) in her mother; Breast cancer in her maternal aunt and mother; Colon cancer in her paternal aunt; Diabetes in her maternal aunt and paternal grandfather; Emphysema in her mother and paternal aunt; Heart disease in her paternal grandfather; Kidney disease in her maternal aunt; Lung cancer in her father; Ovarian cancer in her mother; Pancreatic cancer in her maternal grandmother.  SOCIAL HISTORY:  reports that she quit smoking about 10 years ago. Her smoking use included cigarettes. She started smoking about 57 years ago. She has a 70.6 pack-year smoking history. She has never used smokeless tobacco. She reports that she does not drink alcohol and does not use drugs.  REVIEW OF SYSTEMS:   Pertinent ROS as per HPI  VITAL SIGNS: Temp:  [98 F (36.7 C)-98.6 F (37 C)] 98.5 F (36.9 C) (12/18 1651) Pulse Rate:  [84-109] 90 (12/18 1651) Resp:  [15-21] 15 (12/18 1651) BP: (124-165)/(60-101) 131/60 (12/18 1651) SpO2:  [95 %-98 %] 95 % (12/18 1226) Weight:  [85.2 kg] 85.2 kg (12/18 0500)  PHYSICAL EXAMINATION: General: Elderly female not in distress. Lungs: Clear to auscultation bilaterally, some faint rales in bilateral lower lobes posteriorly. Heart: regular rate rhythm, no murmur appreciated.  Abdomen: non tender, non distended. Normal BS.  Neuro: axox 3.  Moving all extremities.   Recent Labs  Lab 10/06/24 [phone] 10/06/24 [phone] 10/06/24 1524 10/06/24 2053 10/07/24  0550 10/07/24 1041  NA 128* 128*   < > 126* 130* 128*  K 4.3 4.2  --   --  4.7  --   CL 96* 96*  --   --  98  --   CO2 23 21*  --   --  20*  --   BUN 16 16  --   --  18  --   CREATININE 1.26* 1.25*  --   --  1.29*  --   GLUCOSE 130* 149*  --   --  125*  --    < > = values in this interval not displayed.   Recent Labs  Lab 10/03/24 1444 10/03/24 1704 10/04/24 0300  HGB 12.8 11.9* 11.8*  HCT 34.8* 35.0* 33.4*  WBC 14.9*  --  11.5*  PLT 418*  --  352   No results found.  STUDIES:  CXR/CT  scan/ECHO see HPI.  ASSESSMENT / PLAN:  Moderate-severe COPD with exacerbation: Recent COVID status post Paxlovid: -COPD exacerbation has improved.  She will continue to get better. -Continue with Breztri  during the hospitalization.  Continue with as needed DuoNebs. -On discharge home she should resume her home Trelegy along with albuterol  nebs. -Continue I-S. -Will schedule outpatient follow-up appointment with pulmonary within 4 weeks.  Hyponatremia: - Improving. - Nephrology following. - On fluid restriction and salt tabs.  Defer further treatment to primary team.  Pulmonary will sign off.  Please call with questions.  Total care time: 55 minutes   Care time was exclusive of separately billable procedures and treating other patients.  Care was necessary to treat or prevent imminent or life-threatening deterioration.   Care was time spent personally by me on the following activities: development of treatment plan with patient and/or surrogate as well as nursing, discussions with consultants, evaluation of patient's response to treatment, examination of patient, obtaining history from patient or surrogate, ordering and performing treatments and interventions, ordering and review of laboratory studies, ordering and review of radiographic studies and pulse oximetry.   Sammi JONETTA Fredericks, MD Pulmonary, Critical Care and Sleep Attending.   10/07/2024, 5:23 PM

## 2024-10-07 NOTE — Evaluation (Signed)
 Physical Therapy Evaluation Patient Details Name: Shannon Roberts MRN: [id number] DOB: 08/07/1951 Today's Date: 10/07/2024  History of Present Illness  Shannon Roberts is a 73 y.o. female  presented on 12/14 with SOB in the setting of COVID infection; Severe hyponatremia noted on presentation. PMH: allergic rhinitis, normocytic anemia, anxiety, depression, colitis, diverticulosis, female bladder prolapse, fibroid, GERD, heart murmur, adenomatous colon polyps, hyperlipidemia, HTN, IBS, insomnia abscess of the lip, mitral valve prolapse, peripheral vascular disease, diet-controlled prediabetes, right bundle branch block, sciatica, dyspnea, CVA, COPD  Clinical Impression  Pt admitted with above diagnosis. PTA, pt reports ind with household and community ambulation, reports ind with ADLs/IADLs, reports good family support, reports 1 fall in October 2025 where she dislocated her shoulder and required surgery and post-op PT, began driving again Thanksgiving. On eval, pt with good sensation and strength 4+/5-5/5 throughout BLE. Pt with decreased activity tolerance, fatiguing with gait requiring cues for pursed lip breathing and 1 standing rest break. Educated pt on pursed lip breathing, pacing activity as appropriate while returning to baseline, AD use for safety and pt verbalizes understanding. Pt on RA throughout eval with SpO2 95-99%. Pt's daughter present for eval, reports previous RN at this hospital and also worked for [employer], offering pt encouragement during session. Pt appears overwhelmed during eval, becomes tearful, reporting high anxiety and agreeable to chaplin consult; notified nurse. Recommend HHPT and continued family support at d/c.  Pt currently with functional limitations due to the deficits listed below (see PT Problem List). Pt will benefit from acute skilled PT to increase their independence and safety with mobility to allow discharge.           If plan is discharge home, recommend  the following: Assistance with cooking/housework;Assist for transportation;Help with stairs or ramp for entrance   Can travel by private vehicle        Equipment Recommendations Rolling walker (2 wheels)  Recommendations for Other Services       Functional Status Assessment Patient has had a recent decline in their functional status and demonstrates the ability to make significant improvements in function in a reasonable and predictable amount of time.     Precautions / Restrictions Precautions Precautions: Fall Recall of Precautions/Restrictions: Intact Restrictions Weight Bearing Restrictions Per Provider Order: No      Mobility  Bed Mobility               General bed mobility comments: seated at bedside upon arrival    Transfers Overall transfer level: Needs assistance Equipment used: Rolling walker (2 wheels) Transfers: Sit to/from Stand Sit to Stand: Supervision           General transfer comment: BUE assisting to power to stand    Ambulation/Gait Ambulation/Gait assistance: Contact guard assist, Supervision Gait Distance (Feet): 80 Feet Assistive device: Rolling walker (2 wheels) Gait Pattern/deviations: Step-through pattern, Decreased stride length Gait velocity: decreased     General Gait Details: step through gait pattern, decreased heel-toe pattern with flat foot posture, reliant on RW for steadying support, reports dyspnea while on RA with SpO2 95-99%, cues to maintain body within RW frame, limited by fatigue  Stairs            Wheelchair Mobility     Tilt Bed    Modified Rankin (Stroke Patients Only)       Balance Overall balance assessment: Needs assistance Sitting-balance support: Feet supported Sitting balance-Leahy Scale: Fair Sitting balance - Comments: not challenged   Standing balance support: During  functional activity, Bilateral upper extremity supported, Reliant on assistive device for balance Standing  balance-Leahy Scale: Poor                               Pertinent Vitals/Pain Pain Assessment Pain Assessment: No/denies pain    Home Living Family/patient expects to be discharged to:: Private residence Living Arrangements: Alone Available Help at Discharge: Family;Available PRN/intermittently Type of Home: House Home Access: Stairs to enter Entrance Stairs-Rails: Left Entrance Stairs-Number of Steps: 3   Home Layout: One level Home Equipment: Cane - single point      Prior Function Prior Level of Function : Independent/Modified Independent;Driving             Mobility Comments: pt reports ind with household and community amb; reports 1 fall which was Oct 2025 where she dislocated her shoulder requiring surgery and post-op PT ADLs Comments: pt reports ind, began driving again on Thanksgiving     Extremity/Trunk Assessment   Upper Extremity Assessment Upper Extremity Assessment: Defer to OT evaluation (R shoulder limitations, reports biceps tendonitis)    Lower Extremity Assessment Lower Extremity Assessment: Overall WFL for tasks assessed;RLE deficits/detail;LLE deficits/detail RLE Deficits / Details: AROM WFL throughout, ankle 5/5, knee extension 4+/5, hip abduction 4+/5, hip adduction 4+/5, hip flexion 3+/5 RLE Sensation: WNL RLE Coordination: WNL LLE Deficits / Details: AROM WFL throughout, ankle 5/5, knee extension 4+/5, hip abduction 4+/5, hip adduction 4+/5, hip flexion 3+/5 LLE Sensation: WNL LLE Coordination: WNL    Cervical / Trunk Assessment Cervical / Trunk Assessment: Normal  Communication   Communication Communication: No apparent difficulties    Cognition Arousal: Alert Behavior During Therapy: Lability   PT - Cognitive impairments: No apparent impairments                       PT - Cognition Comments: pt a&o x4, appears overwhelmed when recounting recent shoulder surgery and covid, verbalizes high anxiety, tearful but  responds well to encouragement and agreeable to chaplin consult Following commands: Intact       Cueing       General Comments General comments (skin integrity, edema, etc.): Pt on RA with Spo2 95-99%    Exercises     Assessment/Plan    PT Assessment Patient needs continued PT services  PT Problem List Decreased strength;Decreased activity tolerance;Decreased balance;Decreased mobility;Decreased cognition       PT Treatment Interventions DME instruction;Gait training;Stair training;Functional mobility training;Therapeutic activities;Therapeutic exercise;Balance training;Patient/family education    PT Goals (Current goals can be found in the Care Plan section)  Acute Rehab PT Goals Patient Stated Goal: agreeable to [facility] services PT Goal Formulation: With patient/family Time For Goal Achievement: 10/21/24 Potential to Achieve Goals: Good    Frequency Min 3X/week     Co-evaluation               AM-PAC PT 6 Clicks Mobility  Outcome Measure Help needed turning from your back to your side while in a flat bed without using bedrails?: A Little Help needed moving from lying on your back to sitting on the side of a flat bed without using bedrails?: A Little Help needed moving to and from a bed to a chair (including a wheelchair)?: A Little Help needed standing up from a chair using your arms (e.g., wheelchair or bedside chair)?: A Little Help needed to walk in hospital room?: A Little Help needed climbing 3-5 steps with a  railing? : A Little 6 Click Score: 18    End of Session Equipment Utilized During Treatment: Gait belt Activity Tolerance: Patient tolerated treatment well Patient left: in bed;with call bell/phone within reach;with family/visitor present Nurse Communication: Mobility status;Other (comment) (chaplin consult) PT Visit Diagnosis: Other abnormalities of gait and mobility (R26.89)    Time: 8475-8446 PT Time Calculation (min) (ACUTE ONLY): 29  min   Charges:   PT Evaluation $PT Eval Low Complexity: 1 Low PT Treatments $Gait Training: 8-22 mins PT General Charges $$ ACUTE PT VISIT: 1 Visit         Tori Makaylia Hewett PT, DPT 10/07/2024, 4:17 PM

## 2024-10-07 NOTE — Assessment & Plan Note (Deleted)
-  Appears to be stable at this time -Attempt to avoid nephrotoxic medications -Recheck BMP in AM  

## 2024-10-07 NOTE — Assessment & Plan Note (Deleted)
 Continue Synthroid 

## 2024-10-07 NOTE — Assessment & Plan Note (Addendum)
 This is a significant reason for ongoing hospitalization - she is exceedingly anxious Will change Ativan  from prn to q6h standing Ongoing reassurance provided Follow up with Dr. Okey (her psychiatrist for the last 40 years!)

## 2024-10-07 NOTE — Assessment & Plan Note (Addendum)
 Continue Synthroid 

## 2024-10-07 NOTE — Assessment & Plan Note (Addendum)
 Remains deconditioned following recent COVID infection Still with some ambulatory SOB, which is likely to improve over time PT/OT consulting and recommending home health

## 2024-10-07 NOTE — Assessment & Plan Note (Deleted)
 Continue Breztri , flonase  and albuterol  nebs as needed Stable on RA at this time

## 2024-10-07 NOTE — Assessment & Plan Note (Addendum)
 Continue simvastatin .

## 2024-10-07 NOTE — Assessment & Plan Note (Addendum)
 Continue Plavix.  ?

## 2024-10-07 NOTE — Evaluation (Signed)
 Occupational Therapy Evaluation Patient Details Name: Shannon Roberts MRN: [id number] DOB: 1951/08/10 Today's Date: 10/07/2024   History of Present Illness   Shannon Roberts is a 73 yr old female  who presented on 12/14 with SOB in the setting of recent COVID infection; hyponatremia noted on presentation. PMH: allergic rhinitis, normocytic anemia, anxiety, depression, colitis, diverticulosis, female bladder prolapse, fibroid, GERD, heart murmur, adenomatous colon polyps, hyperlipidemia, HTN, IBS, insomnia abscess of the lip, mitral valve prolapse, peripheral vascular disease, diet-controlled prediabetes, right bundle branch block, sciatica, dyspnea, CVA, COPD     Clinical Impressions The pt was received seated in the bedside chair. She was pleasant and motivated to participate in the session. Her daughter was also present. During the session, the pt required supervision to CGA for tasks, including lower body dressing/doffing and donning her socks seated in the chair, sit to stand using a RW, and for short distance ambulation using a RW. She did report feelings of shortness of breath with activity, which she stated was worse than typical of her. She was instructed on implementing rest breaks as needed. OT also recommended use of an incentive spirometer to promote lung expansion and deep breathing. She will benefit from further OT services in the hospital setting to maximize her independence with self-care tasks and to decrease the risk for restricted participation in meaningful activities. OT recommends the pt return home at discharge.      If plan is discharge home, recommend the following:   Help with stairs or ramp for entrance     Functional Status Assessment   Patient has had a recent decline in their functional status and demonstrates the ability to make significant improvements in function in a reasonable and predictable amount of time.     Equipment Recommendations   Other  (comment) (Rolling walker)     Recommendations for Other Services         Precautions/Restrictions   Restrictions Weight Bearing Restrictions Per Provider Order: No     Mobility Bed Mobility      General bed mobility comments: pt was received seated in the bedside chair    Transfers Overall transfer level: Needs assistance Equipment used: Rolling walker (2 wheels) Transfers: Sit to/from Stand Sit to Stand: Supervision                  Balance     Sitting balance-Leahy Scale: Good       Standing balance-Leahy Scale: Fair         ADL either performed or assessed with clinical judgement   ADL Overall ADL's : Needs assistance/impaired Eating/Feeding: Independent;Sitting   Grooming: Set up;Standing   Upper Body Bathing: Set up;Sitting   Lower Body Bathing: Set up;Sitting/lateral leans;Sit to/from stand   Upper Body Dressing : Set up;Sitting   Lower Body Dressing: Set up;Sitting/lateral leans Lower Body Dressing Details (indicate cue type and reason): She doffed then donned her socks seated in the bedside chair Toilet Transfer: Set up;Supervision/safety;Ambulation;Rolling walker (2 wheels)   Toileting- Clothing Manipulation and Hygiene: Set up;Sit to/from stand Toileting - Clothing Manipulation Details (indicate cue type and reason): at bathroom level, based on clinical judgement             Vision Baseline Vision/History: 1 Wears glasses              Pertinent Vitals/Pain Pain Assessment Pain Assessment: No/denies pain     Extremity/Trunk Assessment Upper Extremity Assessment Upper Extremity Assessment: Right hand dominant;LUE deficits/detail;RUE deficits/detail RUE Deficits /  Details: AROM WFL. Grip strength 4+/5 LUE Deficits / Details: AROM WFL. Grip strength 4+/5   Lower Extremity Assessment Lower Extremity Assessment: Overall WFL for tasks assessed l   Communication Communication Communication: No apparent difficulties    Cognition Arousal: Alert Behavior During Therapy: WFL for tasks assessed/performed               OT - Cognition Comments: Oriented x4, friendly                 Following commands: Intact       Cueing  General Comments   Cueing Techniques: Verbal cues             Home Living Family/patient expects to be discharged to:: Private residence Living Arrangements: Alone Available Help at Discharge: Family;Available PRN/intermittently Type of Home: House Home Access: Stairs to enter [employer] of Steps: 3 Entrance Stairs-Rails: Left Home Layout: One level     Bathroom Shower/Tub: Tub/shower unit         Home Equipment: Shower seat;Cane - single point          Prior Functioning/Environment Prior Level of Function : Independent/Modified Independent;Driving             Mobility Comments:  (She is typically independent with ambulation. She had 1 fall in October resulting in a R shoulder dislocation.) ADLs Comments: She was independent with ADLs, cooking, cleaning and driving.    OT Problem List: Decreased strength;Impaired balance (sitting and/or standing);Decreased knowledge of use of DME or AE;Cardiopulmonary status limiting activity   OT Treatment/Interventions: Self-care/ADL training;Therapeutic exercise;Energy conservation;DME and/or AE instruction;Therapeutic activities;Balance training;Patient/family education      OT Goals(Current goals can be found in the care plan section)   Acute Rehab OT Goals OT Goal Formulation: With patient/family Time For Goal Achievement: 10/21/24 Potential to Achieve Goals: Good ADL Goals Pt Will Perform Grooming: with modified independence;standing Pt Will Perform Lower Body Dressing: with modified independence;sit to/from stand Pt Will Transfer to Toilet: with modified independence;ambulating Pt Will Perform Toileting - Clothing Manipulation and hygiene: with modified independence;sit to/from stand    OT Frequency:  Min 1X/week       AM-PAC OT 6 Clicks Daily Activity     Outcome Measure Help from another person eating meals?: None Help from another person taking care of personal grooming?: A Little Help from another person toileting, which includes using toliet, bedpan, or urinal?: A Little Help from another person bathing (including washing, rinsing, drying)?: A Little Help from another person to put on and taking off regular upper body clothing?: None Help from another person to put on and taking off regular lower body clothing?: A Little 6 Click Score: 20   End of Session Equipment Utilized During Treatment: Rolling walker (2 wheels) Nurse Communication: Mobility status  Activity Tolerance: Patient tolerated treatment well Patient left: in chair;with call bell/phone within reach;Other (comment) (MD present in the room)  OT Visit Diagnosis: Muscle weakness (generalized) (M62.81);Other abnormalities of gait and mobility (R26.89)                Time: 8357-8298 OT Time Calculation (min): 19 min Charges:  OT General Charges $OT Visit: 1 Visit OT Evaluation $OT Eval Low Complexity: 1 Low    Delanna Lesches, OTR/L 10/07/2024, 6:07 PM

## 2024-10-07 NOTE — Assessment & Plan Note (Addendum)
 Continue Breztri , flonase  and albuterol  nebs as needed Stable on RA at this time She is very anxious about her breathing and is requesting pulmonology consult; I have reached out to pulmonology accordingly

## 2024-10-07 NOTE — Assessment & Plan Note (Addendum)
 Continue PPI.

## 2024-10-07 NOTE — Assessment & Plan Note (Deleted)
 Na++ 118 on presentation Likely from copious water  intake with recent COVID Has been on free water  restriction Patient is feeling much better but Na++ has stalled at [facility] nephrology Dr. Geralynn  Thought to have a combination of both volume depletion and SIADH at the time of admission due to viral illness Switched over to SIADH management - stop isotonic fluids, start 1000 cc fluid restriction and start salt tablets 2 g 3 times daily Na++ is up to 130 this AM and patient can be discharged per nephrology She is recommended to discharge on NaCl 1 gram TID x 7 days with fluid restriction of 1000cc/day x 7 days Will need PCP follow up with repeat labs in 7-10 days

## 2024-10-07 NOTE — Progress Notes (Signed)
 Progress Note   Patient: Shannon Roberts FMW:[id number] DOB: 1951-03-10 DOA: 10/03/2024     3 DOS: the patient was seen and examined on 10/07/2024   Brief hospital course: 73yo with h/o normocytic anemia, anxiety/depression, HTN, HLD, PVD, preDM, CVA, and COPD who presented on 12/14 with SOB in the setting of COVID infection.  Severe hyponatremia noted on presentation.  Free water  restricted with some improvement.  Nephrology consulted.  Added SIADH management.   Assessment & Plan Hyponatremia Na++ 118 on presentation Likely from copious water  intake with recent COVID Has been on free water  restriction Patient is feeling much better but Na++ stalled at [facility] nephrology Dr. Geralynn  Thought to have a combination of both volume depletion and SIADH at the time of admission due to viral illness Switched over to SIADH management - stopped isotonic fluids, started 1000 cc fluid restriction and salt tablets  Na++ is up to 130 this AM and patient can be discharged per nephrology She is recommended to discharge on NaCl 1 gram TID x 7 days with fluid restriction of 1000cc/day x 7 days Will need PCP follow up with repeat labs in 7-10 days Anxiety disorder This is a significant reason for ongoing hospitalization - she is exceedingly anxious Will change Ativan  from prn to q6h standing Ongoing reassurance provided Follow up with Dr. Okey (her psychiatrist for the last 40 years!) Ambulatory dysfunction Remains deconditioned following recent COVID infection Still with some ambulatory SOB, which is likely to improve over time PT/OT consulting and recommending home health GERD (gastroesophageal reflux disease) Continue PPI COPD, severe (HCC) Continue Breztri , flonase  and albuterol  nebs as needed Stable on RA at this time She is very anxious about her breathing and is requesting pulmonology consult; I have reached out to pulmonology accordingly HTN (hypertension), malignant Resume  amlodipine , atenolol  Lisinopril  is on hold for at least 1 month, per nephrology Hyperlipidemia Continue simvastatin  Pre-diabetes Diet controlled Glucose in the low 100s Hypothyroidism Continue Synthroid  History of CVA (cerebrovascular accident) Continue Plavix  COVID-19 virus infection Completed Paxlovid Mostly resolved No longer on precautions Chronic kidney disease, stage 3a (HCC) Appears to be stable at this time Attempt to avoid nephrotoxic medications Class 1 obesity due to excess calories with body mass index (BMI) of 31.0 to 31.9 in adult Body mass index is 31.26 kg/m.SABRA  Weight loss should be encouraged Outpatient PCP/bariatric medicine f/u encouraged Significantly low or high BMI is associated with higher medical risk including morbidity and mortality       Consultants: Nephrology Pulmonology PT OT Inpatient case management   Procedures: None   Antibiotics: None  30 Day Unplanned Readmission Risk Score    Flowsheet Row ED to Hosp-Admission (Current) from 10/03/2024 in [facility]  30 Day Unplanned Readmission Risk Score (%) 18.95 Filed at 10/07/2024 1600    This score is the patient's risk of an unplanned readmission within 30 days of being discharged (0 -100%). The score is based on dignosis, age, lab data, medications, orders, and past utilization.   Low:  0-14.9   Medium: 15-21.9   High: 22-29.9   Extreme: 30 and above           Subjective: Feeling better but very anxious.  She feels mild dyspnea without hypoxia with ambulation and wants to see pulmonology.   Objective: Vitals:   10/07/24 1226 10/07/24 1651  BP: 124/70 131/60  Pulse: 93 90  Resp: (!) 21 15  Temp: 98.4 F (36.9 C) 98.5  F (36.9 C)  SpO2: 95%     Intake/Output Summary (Last 24 hours) at 10/07/2024 1656 Last data filed at 10/07/2024 1527 Gross per 24 hour  Intake 594 ml  Output 750 ml  Net -156 ml   Filed Weights   10/03/24  1251 10/03/24 2200 10/07/24 0500  Weight: 86.2 kg 84.6 kg 85.2 kg    Exam:  General:  Appears calm and comfortable and is in NAD Eyes:  normal lids, iris ENT:  grossly normal hearing, lips & tongue, mmm Cardiovascular:  RRR. No LE edema.  Respiratory:   CTA bilaterally with no wheezes/rales/rhonchi.  Normal respiratory effort. Abdomen:  soft, NT, ND Skin:  no rash or induration seen on limited exam Musculoskeletal:  grossly normal tone BUE/BLE, good ROM, no bony abnormality Psychiatric:  anxious mood and affect, speech fluent and appropriate, AOx3 Neurologic:  CN 2-12 grossly intact, moves all extremities in coordinated fashion  Data Reviewed: I have reviewed the patient's lab results since admission.  Pertinent labs for today include:   Na++ 130 CO2 20 Glucose 125 BUN 18/Creatinine 1.29/GFR 43, stable     Family Communication: Daughter and son were present  Mobility: PT/OT Consulted and are recommending - Home Health Pt12/18/2025 1614    Code Status: Full Code   Disposition: Status is: Inpatient Remains inpatient appropriate because: ongoing monitoring, anticipate early bird dc tomorrow     Time spent: 50 minutes  Unresulted Labs (From admission, onward)     Start     Ordered   10/08/24 0500  Basic metabolic panel  Tomorrow morning,   R       Question:  Specimen collection method  Answer:  Lab=Lab collect   10/07/24 1427             Author: Delon Herald, MD 10/07/2024 4:56 PM  For on call review [email or web address].

## 2024-10-07 NOTE — Assessment & Plan Note (Addendum)
 Diet controlled Glucose in the low 100s

## 2024-10-08 ENCOUNTER — Telehealth: Payer: Self-pay

## 2024-10-08 ENCOUNTER — Other Ambulatory Visit (HOSPITAL_COMMUNITY): Payer: Self-pay

## 2024-10-08 ENCOUNTER — Inpatient Hospital Stay (HOSPITAL_COMMUNITY)

## 2024-10-08 DIAGNOSIS — I2510 Atherosclerotic heart disease of native coronary artery without angina pectoris: Secondary | ICD-10-CM | POA: Insufficient documentation

## 2024-10-08 DIAGNOSIS — E871 Hypo-osmolality and hyponatremia: Secondary | ICD-10-CM | POA: Diagnosis not present

## 2024-10-08 MED ORDER — POLYETHYLENE GLYCOL 3350 17 GM/SCOOP PO POWD
17.0000 g | Freq: Every day | ORAL | 0 refills | Status: AC
  Filled 2024-10-08: qty 238, 14d supply, fill #0

## 2024-10-08 MED ORDER — ALBUTEROL SULFATE (2.5 MG/3ML) 0.083% IN NEBU
2.5000 mg | INHALATION_SOLUTION | Freq: Three times a day (TID) | RESPIRATORY_TRACT | Status: DC
  Administered 2024-10-08 (×2): 2.5 mg via RESPIRATORY_TRACT
  Filled 2024-10-08 (×2): qty 3

## 2024-10-08 MED ORDER — SODIUM CHLORIDE 1 G PO TABS
2.0000 g | ORAL_TABLET | Freq: Three times a day (TID) | ORAL | 0 refills | Status: DC
  Filled 2024-10-08: qty 42, 7d supply, fill #0

## 2024-10-08 MED ORDER — ACETAMINOPHEN 325 MG PO TABS: 650.0000 mg | ORAL_TABLET | Freq: Four times a day (QID) | ORAL | Status: AC | PRN

## 2024-10-08 NOTE — Assessment & Plan Note (Addendum)
 Continue Synthroid 

## 2024-10-08 NOTE — Assessment & Plan Note (Addendum)
 Remains deconditioned following recent COVID infection Still with some ambulatory SOB, which is likely to improve over time PT/OT consulting and recommending home health PT and RN

## 2024-10-08 NOTE — Progress Notes (Signed)
 SATURATION QUALIFICATIONS: (This note is used to comply with regulatory documentation for home oxygen)  Patient Saturations on Room Air at Rest = 91%  Patient Saturations on Room Air while Ambulating = 88%  Patient Saturations on 2 Liters of oxygen while Ambulating = 95%  Please briefly explain why patient needs home oxygen: Patient drops to 88% on room air with ambulation.

## 2024-10-08 NOTE — Assessment & Plan Note (Addendum)
 Resume amlodipine , atenolol  Lisinopril  is on hold for at least 1 month, per nephrology

## 2024-10-08 NOTE — Telephone Encounter (Signed)
 Spoke with patient let her know it did send a message to Dr. Kara and she should ask the nuse at discharged when they go over her paperwork.      Copied from CRM [id number]. Topic: Clinical - Medical Advice >> Oct 07, 2024  2:44 PM Corean SAUNDERS wrote: Reason for CRM: Patient is in hospital since 12/14 and is requesting a call back from Dr. Ples nurse as the hospital is keeping her there because of her lungs but patient states she has not seen a lung specialist since being there. >> Oct 08, 2024 11:39 AM Corean SAUNDERS wrote: Patient states she had an additional question on if she should still be taking the HYDROcodone  bit-homatropine (HYCODAN) 5-1.5 MG/5ML syrup

## 2024-10-08 NOTE — Assessment & Plan Note (Addendum)
 Continue PPI.

## 2024-10-08 NOTE — Assessment & Plan Note (Addendum)
-  Appears to be stable at this time -Attempt to avoid nephrotoxic medications

## 2024-10-08 NOTE — TOC Transition Note (Addendum)
 Transition of Care [facility]) - Discharge Note   Patient Details  Name: Shannon Roberts MRN: [id number] Date of Birth: 04/11/1951  [facility]) CM/SW Contact:  Sonda Manuella Quill, RN Phone Number: 10/08/2024, 12:42 PM   Clinical Narrative:    D/C orders received; HHPT/RN set up w/ [facility]; RW has been delivered to room by [employer]; no IP CM needs.  -1554- orders received for home oxygen; spoke w/ pt's dtr Angie; she declined having this RN, CM set up home oxygen until she has spoke w/ Dr Barbarann; explained MD will be notified but d/t time, would she like for oxygen to include travel tank be set up; she again declined and would like to speak w/ MD; Dr Barbarann notified via secure chat.  -1653- spoke w/ pt's dtr Angie; she agreed for pt to receive recc home oxygen; she does not have agency preference; explained travel tank will be delivered to room, and her contact info will be given; LVM for Jollany at [facility]; spoke w/ Mitch Caine at [employer]; he said agency can provide oxygen and travel tank will be delivered to room before d/c; he was also given dtr's contact info; agency contact info placed in follow up provider section of d/c instructions; no IP CM needs.  Final next level of care: Home w Home Health Services Barriers to Discharge: No Barriers Identified   Patient Goals and CMS Choice Patient states their goals for this hospitalization and ongoing recovery are:: home CMS Medicare.gov Compare Post Acute Care list provided to:: Patient   [place] ownership interest in [facility].provided to:: Patient    Discharge Placement                       Discharge Plan and Services Additional resources added to the After Visit Summary for     Discharge Planning Services: CM Consult                      HH Arranged: RN, PT [facility] Agency: [facility] Date [facility] Agency Contacted: 10/08/24 Time HH Agency Contacted: 1235 Representative spoke with at [facility]  Agency: [facility]  Social Drivers of Health (SDOH) Interventions SDOH Screenings   Food Insecurity: No Food Insecurity (10/07/2024)  Housing: Low Risk (10/07/2024)  Transportation Needs: No Transportation Needs (10/07/2024)  Utilities: Not At Risk (10/07/2024)  Social Connections: Socially Isolated (10/03/2024)  Tobacco Use: Medium Risk (10/03/2024)     Readmission Risk Interventions    10/07/2024    5:40 PM 10/05/2024    9:23 AM  Readmission Risk Prevention Plan  Transportation Screening Complete Complete  PCP or Specialist Appt within 5-7 Days Complete Complete  Home Care Screening Complete Complete  Medication Review (RN CM) Complete Complete

## 2024-10-08 NOTE — TOC Progression Note (Addendum)
 Transition of Care [facility]) - Progression Note    Patient Details  Name: Shannon Roberts MRN: [id number] Date of Birth: 19-Sep-1951  Transition of Care [facility]) CM/SW Contact  Sonda Manuella Quill, RN Phone Number: 10/08/2024, 9:24 AM  Clinical Narrative:    Beatris w/ Mitch at [employer]; he said agency can accept referral for RW and DME will be delivered to pt's room prior to d/c; HHPT/RN referral accepted by: Home Medical Care  Service Provider Request Status Services Address Phone Fax Patient Preferred  [facility]  Accepted -- [address], [place] [address] [phone] --  [facility] : [facility])  Accepted -- [address] [place], [place] [address] [phone] --  [facility])  Accepted -- [address], [place] [address] [phone]   She will discuss offers w/ dtr; IP CM will follow up.  -1134- received call from [place] at [facility]; she said pt's dtr Angie Arreola-Locke contacted her regarding acceptance referral; she said explained to pt's dtr that agency did accept referral however on Medicare.gov the agency is listed [facility].  -1145- LVM for pt's dtr Angie Linam-Locke to discuss HHPT agency; awaiting return call.  -1219- return call from pt's dtr Angie; pt's dtr expressed displeasure that RN CM presented list for choice but not when agency start date; explained that referrals are sent electronically and choice is given from accepting agencies; the selected agency will then contact pt/family to set up start of care; pt's dtr said she has selected [facility]; [facility] at [employer] notified; agency contact info placed in follow up provider section of d/c instructions; Delon Lesches, IP CM supervisor notified.  Expected Discharge Plan: Home w Home Health Services Barriers to Discharge: Continued Medical  Work up               Expected Discharge Plan and Services   Discharge Planning Services: CM Consult   Living arrangements for the past 2 months: Single Family Home Expected Discharge Date: 10/08/24                                     Social Drivers of Health (SDOH) Interventions SDOH Screenings   Food Insecurity: No Food Insecurity (10/07/2024)  Housing: Low Risk (10/07/2024)  Transportation Needs: No Transportation Needs (10/07/2024)  Utilities: Not At Risk (10/07/2024)  Social Connections: Socially Isolated (10/03/2024)  Tobacco Use: Medium Risk (10/03/2024)    Readmission Risk Interventions    10/07/2024    5:40 PM 10/05/2024    9:23 AM  Readmission Risk Prevention Plan  Transportation Screening Complete Complete  PCP or Specialist Appt within 5-7 Days Complete Complete  Home Care Screening Complete Complete  Medication Review (RN CM) Complete Complete

## 2024-10-08 NOTE — Assessment & Plan Note (Addendum)
 This has been a significant contributor to ongoing hospitalization - she is exceedingly anxious Resume home Ativan  as needed Continue duloxetine  Ongoing reassurance provided Follow up with Dr. Okey (her physician for the last 40 years!)

## 2024-10-08 NOTE — Assessment & Plan Note (Addendum)
 Continue Plavix , Imdur 

## 2024-10-08 NOTE — Telephone Encounter (Signed)
 NFN

## 2024-10-08 NOTE — Progress Notes (Signed)
 I was given a referral from physical therapist to provide emotional and spiritual support to Shannon Roberts.  She shared about her health journey and about the things that overwhelm her.  We explored some strategies for coping with anxiety and what she described as being inpatient.  She has a daughter and a son, but is understanding that they both have busy lives with their kids. I provided social support as well as emotional support.  She was very adult nurse.

## 2024-10-08 NOTE — Assessment & Plan Note (Addendum)
 Completed Paxlovid Mostly resolved No longer on precautions

## 2024-10-08 NOTE — Assessment & Plan Note (Addendum)
 Continue simvastatin , Repatha 

## 2024-10-08 NOTE — Assessment & Plan Note (Addendum)
 Na++ 118 on presentation Likely from copious water  intake with recent COVID Has been on free water  restriction Patient is feeling much better but Na++ stalled at [facility] nephrology Dr. Geralynn  Thought to have a combination of both volume depletion and SIADH at the time of admission due to viral illness Switched over to SIADH management - stopped isotonic fluids, started 1000 cc fluid restriction and salt tablets  Na++ is up to 130 this AM and patient can be discharged per nephrology She is recommended to discharge on NaCl 1 gram TID x 7 days with fluid restriction of 1000cc/day x 7 days Will need PCP follow up with repeat labs in 7-10 days

## 2024-10-08 NOTE — Progress Notes (Signed)
 Attempted to call daughter for transport for dc. No answer.

## 2024-10-08 NOTE — Telephone Encounter (Signed)
 Copied from CRM [id number]. Topic: Clinical - Medical Advice >> Oct 07, 2024  2:44 PM Corean SAUNDERS wrote: Reason for CRM: Patient is in hospital since 12/14 and is requesting a call back from Dr. Ples nurse as the hospital is keeping her there because of her lungs but patient states she has not seen a lung specialist since being there.  Pt has been scheduled HFU with Dr. Kara

## 2024-10-08 NOTE — Plan of Care (Addendum)
 Pt with small BM following ordered medication from on call provider  Problem: Education: Goal: Knowledge of General Education information will improve Description: Including pain rating scale, medication(s)/side effects and non-pharmacologic comfort measures Outcome: Progressing   Problem: Health Behavior/Discharge Planning: Goal: Ability to manage health-related needs will improve Outcome: Progressing   Problem: Clinical Measurements: Goal: Ability to maintain clinical measurements within normal limits will improve Outcome: Progressing Goal: Will remain free from infection Outcome: Progressing   Problem: Activity: Goal: Risk for activity intolerance will decrease Outcome: Progressing   Problem: Nutrition: Goal: Adequate nutrition will be maintained Outcome: Progressing   Problem: Coping: Goal: Level of anxiety will decrease Outcome: Progressing   Problem: Elimination: Goal: Will not experience complications related to urinary retention Outcome: Progressing   Problem: Pain Managment: Goal: General experience of comfort will improve and/or be controlled Outcome: Progressing   Problem: Safety: Goal: Ability to remain free from injury will improve Outcome: Progressing   Problem: Skin Integrity: Goal: Risk for impaired skin integrity will decrease Outcome: Progressing   Problem: Elimination: Goal: Will not experience complications related to bowel motility Outcome: Not Progressing

## 2024-10-08 NOTE — Discharge Summary (Addendum)
 " Physician Discharge Summary   Patient: Shannon Roberts MRN: [id number] DOB: 09-19-1951  Admit date:     10/03/2024  Discharge date: 10/08/2024  Discharge Physician: Delon Herald   PCP: Okey Carlin Redbird, MD   Recommendations at discharge:   You are being discharged with home health physical therapy and nursing services You are being discharged home with oxygen Take salt tablets 3 times daily for the next 7 days Limit fluid intake to 1000 cc (1 liter, about 32 ounces of total fluid) daily for the next 7 days Follow up with Dr. Okey in 1 week for hospital follow up and lab recheck Hold lisinopril  for at least 1 month You are being referred for lung cancer screening and should be contacted to schedule an appointment   Discharge Diagnoses: Principal Problem:   Hyponatremia Active Problems:   GERD (gastroesophageal reflux disease)   COPD, severe (HCC)   HTN (hypertension), malignant   Hyperlipidemia   Anxiety disorder   History of CVA (cerebrovascular accident)   Pre-diabetes   Chronic kidney disease, stage 3a (HCC)   Hypothyroidism   COVID-19 virus infection   Class 1 obesity due to excess calories with body mass index (BMI) of 31.0 to 31.9 in adult   Ambulatory dysfunction   CAD (coronary artery disease)   Hospital Course: 73yo with h/o normocytic anemia, anxiety/depression, HTN, HLD, PVD, preDM, CVA, and COPD who presented on 12/14 with SOB in the setting of COVID infection.  Severe hyponatremia noted on presentation.  Free water  restricted with some improvement.  Nephrology consulted.  Added SIADH management.  Assessment and Plan:  Assessment & Plan Hyponatremia Na++ 118 on presentation Likely from copious water  intake with recent COVID Has been on free water  restriction Patient is feeling much better but Na++ stalled at [facility] nephrology Dr. Geralynn  Thought to have a combination of both volume depletion and SIADH at the time of admission due to viral  illness Switched over to SIADH management - stopped isotonic fluids, started 1000 cc fluid restriction and salt tablets  Na++ is up to 130 this AM and patient can be discharged per nephrology She is recommended to discharge on NaCl 1 gram TID x 7 days with fluid restriction of 1000cc/day x 7 days Will need PCP follow up with repeat labs in 7-10 days Anxiety disorder This has been a significant contributor to ongoing hospitalization - she is exceedingly anxious Resume home Ativan  as needed Continue duloxetine  Ongoing reassurance provided Follow up with Dr. Okey (her physician for the last 40 years!) Ambulatory dysfunction Remains deconditioned following recent COVID infection Still with some ambulatory SOB, which is likely to improve over time PT/OT consulting and recommending home health PT and RN GERD (gastroesophageal reflux disease) Continue PPI COPD, severe (HCC) She was seen by Dr. Theodoro on 12/18 COPD exacerbated by COVID, improving Resume Trelegy and albuterol  nebs as needed Stable on RA but sats dropped to 88% with ambulation today so she will dc home with O2 Follow up with pulmonology in 4 weeks HTN (hypertension), malignant Resume amlodipine , atenolol  Lisinopril  is on hold for at least 1 month, per nephrology Hyperlipidemia Continue simvastatin , Repatha  Pre-diabetes Diet controlled Glucose in the low 100s Hypothyroidism Continue Synthroid  History of CVA (cerebrovascular accident) CAD (coronary artery disease) Continue Plavix , Imdur  COVID-19 virus infection Completed Paxlovid Mostly resolved No longer on precautions Chronic kidney disease, stage 3a (HCC) Appears to be stable at this time Attempt to avoid nephrotoxic medications Class 1 obesity due to excess calories  with body mass index (BMI) of 31.0 to 31.9 in adult Body mass index is 30.34 kg/m.Shannon Roberts  Weight loss should be encouraged Outpatient PCP/bariatric medicine f/u encouraged Significantly low or high BMI  is associated with higher medical risk including morbidity and mortality       Consultants: Nephrology Pulmonology PT OT Inpatient case management   Procedures: None   Antibiotics: None   Pain control - [place]  Controlled Substance Reporting System database was reviewed. and patient was instructed, not to drive, operate heavy machinery, perform activities at heights, swimming or participation in water  activities or provide baby-sitting services while on Pain, Sleep and Anxiety Medications; until their outpatient Physician has advised to do so again. Also recommended to not to take more than prescribed Pain, Sleep and Anxiety Medications.   Disposition: Home Diet recommendation:  Cardiac and Carb modified diet DISCHARGE MEDICATION: Allergies as of 10/08/2024       Reactions   Azithromycin  Other (See Comments)   Pt states she cannot take this due to her heart medication and heart condition   Carvedilol Palpitations   Codeine  Shortness Of Breath   Buspirone Other (See Comments)   Did not work   Dapsone Other (See Comments)   Flushing    Niacin And Related Other (See Comments)   Flushing    Paroxetine Hcl Other (See Comments)   Dizziness and dreams   Septra [sulfamethoxazole-trimethoprim] Diarrhea, Nausea And Vomiting   Sertraline Hcl Other (See Comments)   Reported skin crawling and a fuzzy head   Amoxicillin Rash, Other (See Comments)   FLUSHED FEELING   Wellbutrin [bupropion] Anxiety, Other (See Comments)   Caused the patient to feel jittery        Medication List     PAUSE taking these medications    lisinopril  40 MG tablet Wait to take this until your doctor or other care provider tells you to start again. Commonly known as: ZESTRIL  Take 40 mg by mouth daily.       STOP taking these medications    doxycycline  100 MG tablet Commonly known as: VIBRA -TABS   HYDROcodone  bit-homatropine 5-1.5 MG/5ML syrup Commonly known as: HYCODAN    traMADol  50 MG tablet Commonly known as: ULTRAM        TAKE these medications    acetaminophen  325 MG tablet Commonly known as: TYLENOL  Take 2 tablets (650 mg total) by mouth every 6 (six) hours as needed for mild pain (pain score 1-3) or fever (or Fever >/= 101).   albuterol  (2.5 MG/3ML) 0.083% nebulizer solution Commonly known as: PROVENTIL  Take 3 mLs (2.5 mg total) by nebulization every 6 (six) hours as needed for wheezing or shortness of breath.   amLODipine  5 MG tablet Commonly known as: NORVASC  Take 5 mg by mouth daily.   atenolol  50 MG tablet Commonly known as: TENORMIN  Take 50 mg by mouth daily.   clopidogrel  75 MG tablet Commonly known as: PLAVIX  Take 75 mg by mouth daily.   Combivent  Respimat 20-100 MCG/ACT Aers respimat Generic drug: Ipratropium-Albuterol  Inhale 1 puff into the lungs every 4 (four) hours as needed for wheezing or shortness of breath.   DULoxetine  30 MG capsule Commonly known as: CYMBALTA  Take 60 mg by mouth in the morning.   ferrous sulfate  325 (65 FE) MG tablet Take 325 mg by mouth daily with breakfast.   fluticasone  50 MCG/ACT nasal spray Commonly known as: FLONASE  Place 1 spray into both nostrils 2 (two) times daily as needed for allergies or rhinitis.  isosorbide  mononitrate 30 MG 24 hr tablet Commonly known as: IMDUR  Take 30 mg by mouth in the morning and at bedtime.   levothyroxine  50 MCG tablet Commonly known as: SYNTHROID  Take 50 mcg by mouth daily before breakfast.   LORazepam  0.5 MG tablet Commonly known as: ATIVAN  Take 0.5-0.75 mg by mouth every 6 (six) hours as needed for anxiety.   Mucinex  600 MG 12 hr tablet Generic drug: guaiFENesin  Take 600 mg by mouth 2 (two) times daily as needed for to loosen phlegm.   ondansetron  4 MG disintegrating tablet Commonly known as: ZOFRAN -ODT Take 1 tablet (4 mg total) by mouth every 8 (eight) hours as needed for nausea or vomiting. What changed: reasons to take this    pantoprazole  40 MG tablet Commonly known as: PROTONIX  Take 40 mg by mouth daily before breakfast.   polyethylene glycol powder 17 GM/SCOOP powder Commonly known as: GLYCOLAX /MIRALAX  Take 17 g by mouth daily. Dissolve 1 capful (17g) in 4-8 ounces of liquid and take by mouth daily.   Repatha  SureClick 140 MG/ML Soaj Generic drug: Evolocumab  Inject 140 mg into the skin every 14 (fourteen) days. INJECT UNDER THE SKIN EVERY 14 DAYS. What changed: additional instructions   simvastatin  20 MG tablet Commonly known as: ZOCOR  Take 40 mg by mouth at bedtime.   sodium chloride  1 g tablet Take 2 tablets (2 g total) by mouth 3 (three) times daily with meals for 7 days.   Trelegy Ellipta  200-62.5-25 MCG/ACT Aepb Generic drug: Fluticasone -Umeclidin-Vilant Inhale 1 puff into the lungs daily.   Vitamin D3 125 MCG (5000 UT) Caps Take 5,000 Units by mouth See admin instructions. Take 5,000 units by mouth on Saturdays and Sundays               Durable Medical Equipment  (From admission, onward)           Start     Ordered   10/08/24 0000  For home use only DME oxygen       Question Answer Comment  Length of Need 6 Months   Mode or (Route) Nasal cannula   Liters per Minute 2   Frequency Continuous (stationary and portable oxygen unit needed)   Oxygen conserving device Yes   Oxygen delivery system: Gas   Oxygen delivery system: Concentrator   Oxygen delivery system: Portable concentrator (POC)      10/08/24 1334   10/07/24 1725  For home use only DME Walker rolling  Once       Question Answer Comment  Walker: With 5 Inch Wheels   Patient needs a walker to treat with the following condition Walker as ambulation aid      12 /18/25 1725            Contact information for follow-up providers     Okey Carlin Redbird, MD. Schedule an appointment as soon as possible for a visit in 1 week(s).   Specialty: Family Medicine Contact information: [address] [place] [address] [phone]         Kara Dorn NOVAK, MD Follow up in 4 week(s).   Specialty: Pulmonary Disease Contact information: [address] [place] [address] [phone]              Contact information for after-discharge care     Durable Medical Equipment     [facility] (DME) .   Service: Durable Medical Equipment Contact information: [address] [place]  [address]  [phone]             Home Medical Care     [facility] [place] [facility]) .   Service: Home Health Services Contact information: [address] [place]  [address] [phone]                    Discharge Exam:   Subjective: Very anxious this AM and feeling SOB (has not had AM neb).  Had a suppository with small BM but no significant BM yet.  Anxious about going home.   Objective: Vitals:   10/08/24 0436 10/08/24 0907  BP: (!) 139/92   Pulse: (!) 108   Resp: 18   Temp: 99.3 F (37.4 C)   SpO2: 91% 92%    Intake/Output Summary (Last 24 hours) at 10/08/2024 1334 Last data filed at 10/07/2024 1527 Gross per 24 hour  Intake --  Output 250 ml  Net -250 ml   Filed Weights   10/03/24 2200 10/07/24 0500 10/08/24 0500  Weight: 84.6 kg 85.2 kg 82.7 kg    Exam:  General:  Appears calm and comfortable and is in NAD Eyes:  normal lids, iris ENT:  grossly normal hearing, lips & tongue, mmm Cardiovascular:  RRR. No LE edema.  Respiratory:   Mildly diminished air movement with mild expiratory wheezing.  Normal respiratory effort. Abdomen:  soft, NT, ND Skin:  no rash or induration seen on limited exam Musculoskeletal:  grossly normal tone BUE/BLE, good ROM, no bony abnormality Psychiatric:  anxious mood and affect, speech fluent and appropriate, AOx3 Neurologic:  CN 2-12 grossly intact, moves all extremities in coordinated  fashion  Data Reviewed: I have reviewed the patient's lab results since admission.  Pertinent labs for today include:   None    Condition at discharge: improving  The results of significant diagnostics from this hospitalization (including imaging, microbiology, ancillary and laboratory) are listed below for reference.   Imaging Studies: DG CHEST PORT 1 VIEW Result Date: 10/05/2024 CLINICAL DATA:  Follow-up exam. EXAM: PORTABLE CHEST 1 VIEW COMPARISON:  10/03/2024 FINDINGS: Lungs are hypoinflated without focal airspace consolidation or effusion. Cardiomediastinal silhouette and remainder of the exam is unchanged. IMPRESSION: Hypoinflation without acute cardiopulmonary disease. Electronically Signed   By: Toribio Agreste M.D.   On: 10/05/2024 15:16   DG Chest Port 1 View Result Date: 10/03/2024 CLINICAL DATA:  Shortness of breath.  Recent COVID. EXAM: PORTABLE CHEST 1 VIEW COMPARISON:  01/23/2024. FINDINGS: The heart size and mediastinal contours are within normal limits. Atherosclerotic calcification of the aorta is noted. No consolidation, effusion, or pneumothorax is seen. No acute osseous abnormality. IMPRESSION: No active disease. Electronically Signed   By: Leita Birmingham M.D.   On: 10/03/2024 14:05    Microbiology: Results for orders placed or performed during the hospital encounter of 10/03/24  MRSA Next Gen by PCR, Nasal     Status: None   Collection Time: 10/03/24  9:45 PM   Specimen: Nasal Mucosa; Nasal Swab  Result Value Ref Range Status   MRSA by PCR Next Gen NOT DETECTED NOT DETECTED Final    Comment: (NOTE) The GeneXpert MRSA Assay (FDA approved for NASAL specimens only), is one component of a comprehensive MRSA colonization surveillance program. It is not intended to diagnose MRSA infection nor to guide or monitor treatment for MRSA infections. Test performance is not FDA approved in patients less than 80 years old. Performed at [facility], [address]  [place], [place] [address]     Labs: CBC: Recent Labs  Lab 10/03/24 1444 10/03/24 1704 10/04/24 0300  WBC 14.9*  --  11.5*  HGB 12.8 11.9* 11.8*  HCT 34.8* 35.0* 33.4*  MCV 86.4  --  87.7  PLT 418*  --  352   Basic Metabolic Panel: Recent Labs  Lab 10/05/24 1435 10/05/24 2053 10/06/24 0623 10/06/24 0903 10/06/24 1524 10/06/24 2053 10/07/24 0550 10/07/24 1041  NA 126* 128* 128* 128* 127* 126* 130* 128*  K 4.2 4.2 4.3 4.2  --   --  4.7  --   CL 95* 95* 96* 96*  --   --  98  --   CO2 19* 21* 23 21*  --   --  20*  --   GLUCOSE 169* 119* 130* 149*  --   --  125*  --   BUN 19 19 16 16   --   --  18  --   CREATININE 1.24* 1.25* 1.26* 1.25*  --   --  1.29*  --   CALCIUM  9.1 9.9 9.3 9.2  --   --  9.8  --    Liver Function Tests: Recent Labs  Lab 10/04/24 0300  AST 34  ALT 31  ALKPHOS 60  BILITOT 0.5  PROT 6.4*  ALBUMIN  3.7   CBG: No results for input(s): GLUCAP in the last 168 hours.  Discharge time spent: greater than 30 minutes.  Signed: Delon Herald, MD [facility] 10/08/2024 "

## 2024-10-08 NOTE — Care Management Important Message (Signed)
 Important Message  Patient Details IM Letter given. Name: Shannon Roberts MRN: [id number] Date of Birth: [birth year]   Important Message Given:  Yes - Medicare IM     [name] 10/08/2024, 11:52 AM

## 2024-10-08 NOTE — Assessment & Plan Note (Addendum)
 Body mass index is 30.34 kg/m.SABRA  Weight loss should be encouraged Outpatient PCP/bariatric medicine f/u encouraged Significantly low or high BMI is associated with higher medical risk including morbidity and mortality

## 2024-10-08 NOTE — Progress Notes (Signed)
 Discharge meds in a secure bag delivered to patient by this RN

## 2024-10-08 NOTE — Progress Notes (Signed)
 Mobility Specialist Progress Note:  [place] 2 L  10/08/24 1313  Mobility  Activity Ambulated with assistance  Level of Assistance Contact guard assist, steadying assist  Assistive Device Front wheel walker  Distance Ambulated (ft) 55 ft  Activity Response Tolerated fair  Mobility Referral Yes  Mobility visit 1 Mobility  Mobility Specialist Start Time (ACUTE ONLY) 1251  Mobility Specialist Stop Time (ACUTE ONLY) 1304  Mobility Specialist Time Calculation (min) (ACUTE ONLY) 13 min   Nurse requested Mobility Specialist to perform oxygen saturation test with pt which includes removing pt from oxygen both at rest and while ambulating.  Below are the results from that testing.     Patient Saturations on Room Air at Rest = spO2 91%  Patient Saturations on Room Air while Ambulating = sp02 88% .  Rested and performed pursed lip breathing for 1 minute with sp02 at 88%.  Patient Saturations on 2 Liters of oxygen while Ambulating = sp02 95%  At end of testing pt left in room on 2  Liters of oxygen.  Limited ambulation due to Pt stating shortness of breath and weakness. Returned to bed with all needs met.  Reported results to nurse.    [employer] - Mobility Specialist

## 2024-10-08 NOTE — Assessment & Plan Note (Addendum)
 She was seen by Dr. Theodoro on 12/18 COPD exacerbated by COVID, improving Resume Trelegy and albuterol  nebs as needed Stable on RA but sats dropped to 88% with ambulation today so she will dc home with O2 Follow up with pulmonology in 4 weeks

## 2024-10-08 NOTE — TOC Initial Note (Signed)
 Transition of Care [facility]) - Initial/Assessment Note    Patient Details  Name: Shannon Roberts MRN: [id number] Date of Birth: 22-Mar-1951  Transition [facility]) CM/SW Contact:    Sonda Manuella Quill, RN Phone Number: 10/08/2024, 8:52 AM  Clinical Narrative:                 Recc for HHPT and RW; spoke w/ pt and dtr Jon Smalls [phone]) in room; pt said she lives at home; she plans w/ her dtr's support; she will provide transportation; pt verified insurance/PCP; she denied SDOH risks; pt has cane, shower chair, and nebulizer; she does not have home oxygen; they agree to received recc services, and they do not have an agency preference for either; referrals faxed out in hub; awaiting offers then choice.  Expected Discharge Plan: Home w Home Health Services Barriers to Discharge: Continued Medical Work up   Patient Goals and CMS Choice Patient states their goals for this hospitalization and ongoing recovery are:: home CMS Medicare.gov Compare Post Acute Care list provided to:: Patient   [place] ownership interest in [facility].provided to:: Patient    Expected Discharge Plan and Services   Discharge Planning Services: CM Consult   Living arrangements for the past 2 months: Single Family Home Expected Discharge Date: 10/08/24                                    Prior Living Arrangements/Services Living arrangements for the past 2 months: Single Family Home Lives with:: Self Patient language and need for interpreter reviewed:: Yes Do you feel safe going back to the place where you live?: Yes      Need for Family Participation in Patient Care: Yes (Comment) Care giver support system in place?: Yes (comment) Current home services: DME (cane, shower chair, nebulizer) Criminal Activity/Legal Involvement Pertinent to Current Situation/Hospitalization: No - Comment as needed  Activities of Daily Living   ADL Screening (condition at time of  admission) Independently performs ADLs?: Yes (appropriate for developmental age) Is the patient deaf or have difficulty hearing?: No Does the patient have difficulty seeing, even when wearing glasses/contacts?: No Does the patient have difficulty concentrating, remembering, or making decisions?: No  Permission Sought/Granted Permission sought to share information with : Case Manager Permission granted to share information with : Yes, Verbal Permission Granted  Share Information with NAME: Case Manager     Permission granted to share info w Relationship: Euleta Belson (dtr) [phone]     Emotional Assessment Appearance:: Appears stated age Attitude/Demeanor/Rapport: Gracious Affect (typically observed): Accepting Orientation: : Oriented to Self, Oriented to Place, Oriented to  Time, Oriented to Situation Alcohol / Substance Use: Not Applicable Psych Involvement: No (comment)  Admission diagnosis:  Shortness of breath [R06.02] Hyponatremia [E87.1] Patient Active Problem List   Diagnosis Date Noted   CAD (coronary artery disease) 10/08/2024   Ambulatory dysfunction 10/07/2024   COVID-19 virus infection 10/06/2024   Class 1 obesity due to excess calories with body mass index (BMI) of 31.0 to 31.9 in adult 10/06/2024   Hyponatremia 10/03/2024   Essential hypertension 10/03/2024   Hypothyroidism 10/03/2024   Agatston coronary artery calcium  score greater than 400 03/14/2022   Elevated LFTs 07/27/2021   Pre-diabetes 08/03/2019   Abnormal urine 08/03/2019   Cyst of ovary 08/03/2019   Endometriosis of the cul-de-sac 08/03/2019   Female bladder prolapse 08/03/2019   Pelvic mass 08/03/2019  Pneumatouria 08/03/2019   Chronic kidney disease, stage 3a (HCC) 08/03/2019   Anemia 07/09/2019   Chronic constipation 07/09/2019   Chronic allergic rhinitis 07/30/2017   RBBB 01/31/2017   History of CVA (cerebrovascular accident) 01/31/2017   Anemia due to chronic kidney disease  01/31/2017   Occlusion and stenosis of carotid artery without mention of cerebral infarction 02/08/2014   Carotid stenosis 01/17/2014   Cerebral infarction (HCC) 01/03/2014   Stroke (HCC) 01/02/2014   Tobacco abuse 10/07/2011   HTN (hypertension), malignant 10/07/2011   Hyperlipidemia 10/07/2011   Anxiety disorder 10/07/2011   GERD (gastroesophageal reflux disease) 05/13/2011   Irritable bowel syndrome 05/13/2011   COPD, severe (HCC) 05/13/2011   PCP:  Okey Carlin Redbird, MD Pharmacy:   [id number] - [place], [place] - [address] AT [facility] [address] [place] [address] Phone: [phone] Fax: [phone]  [id number] [place] [facility] [address] [place] [address] Phone: [phone] Fax: [phone]     Social Drivers of Health (SDOH) Social History: SDOH Screenings   Food Insecurity: No Food Insecurity (10/07/2024)  Housing: Low Risk (10/07/2024)  Transportation Needs: No Transportation Needs (10/07/2024)  Utilities: Not At Risk (10/07/2024)  Social Connections: Socially Isolated (10/03/2024)  Tobacco Use: Medium Risk (10/03/2024)   SDOH Interventions: Food Insecurity Interventions: Intervention Not Indicated, Inpatient TOC Housing Interventions: Intervention Not Indicated, Inpatient TOC Transportation Interventions: Intervention Not Indicated, Inpatient TOC Utilities Interventions: Intervention Not Indicated, Inpatient TOC   Readmission Risk Interventions    10/07/2024    5:40 PM 10/05/2024    9:23 AM  Readmission Risk Prevention Plan  Transportation Screening Complete Complete  PCP or Specialist Appt within 5-7 Days Complete Complete  Home Care Screening Complete Complete  Medication Review (RN CM) Complete Complete

## 2024-10-08 NOTE — Assessment & Plan Note (Addendum)
 Diet controlled Glucose in the low 100s

## 2024-10-09 DIAGNOSIS — E871 Hypo-osmolality and hyponatremia: Secondary | ICD-10-CM | POA: Diagnosis not present

## 2024-10-10 ENCOUNTER — Emergency Department (HOSPITAL_COMMUNITY)

## 2024-10-10 ENCOUNTER — Inpatient Hospital Stay (HOSPITAL_COMMUNITY)
Admission: EM | Admit: 2024-10-10 | Discharge: 2024-10-23 | DRG: 190 | Disposition: A | Discharge status: Skilled Nursing Facility | Attending: Internal Medicine | Admitting: Internal Medicine

## 2024-10-10 ENCOUNTER — Other Ambulatory Visit: Payer: Self-pay

## 2024-10-10 ENCOUNTER — Encounter (HOSPITAL_COMMUNITY): Payer: Self-pay

## 2024-10-10 DIAGNOSIS — Z8041 Family history of malignant neoplasm of ovary: Secondary | ICD-10-CM

## 2024-10-10 DIAGNOSIS — E222 Syndrome of inappropriate secretion of antidiuretic hormone: Secondary | ICD-10-CM | POA: Diagnosis present

## 2024-10-10 DIAGNOSIS — J449 Chronic obstructive pulmonary disease, unspecified: Secondary | ICD-10-CM

## 2024-10-10 DIAGNOSIS — J441 Chronic obstructive pulmonary disease with (acute) exacerbation: Principal | ICD-10-CM | POA: Diagnosis present

## 2024-10-10 DIAGNOSIS — E66811 Obesity, class 1: Secondary | ICD-10-CM | POA: Diagnosis present

## 2024-10-10 DIAGNOSIS — E039 Hypothyroidism, unspecified: Secondary | ICD-10-CM | POA: Diagnosis present

## 2024-10-10 DIAGNOSIS — Z888 Allergy status to other drugs, medicaments and biological substances status: Secondary | ICD-10-CM

## 2024-10-10 DIAGNOSIS — E6609 Other obesity due to excess calories: Secondary | ICD-10-CM

## 2024-10-10 DIAGNOSIS — Z1152 Encounter for screening for COVID-19: Secondary | ICD-10-CM

## 2024-10-10 DIAGNOSIS — Z801 Family history of malignant neoplasm of trachea, bronchus and lung: Secondary | ICD-10-CM

## 2024-10-10 DIAGNOSIS — K219 Gastro-esophageal reflux disease without esophagitis: Secondary | ICD-10-CM | POA: Diagnosis present

## 2024-10-10 DIAGNOSIS — R638 Other symptoms and signs concerning food and fluid intake: Secondary | ICD-10-CM | POA: Insufficient documentation

## 2024-10-10 DIAGNOSIS — F419 Anxiety disorder, unspecified: Secondary | ICD-10-CM | POA: Diagnosis present

## 2024-10-10 DIAGNOSIS — I452 Bifascicular block: Secondary | ICD-10-CM | POA: Diagnosis present

## 2024-10-10 DIAGNOSIS — I10 Essential (primary) hypertension: Secondary | ICD-10-CM | POA: Diagnosis present

## 2024-10-10 DIAGNOSIS — Z79890 Hormone replacement therapy (postmenopausal): Secondary | ICD-10-CM | POA: Diagnosis not present

## 2024-10-10 DIAGNOSIS — D631 Anemia in chronic kidney disease: Secondary | ICD-10-CM | POA: Diagnosis present

## 2024-10-10 DIAGNOSIS — J9601 Acute respiratory failure with hypoxia: Secondary | ICD-10-CM | POA: Diagnosis present

## 2024-10-10 DIAGNOSIS — Z8616 Personal history of COVID-19: Secondary | ICD-10-CM | POA: Diagnosis not present

## 2024-10-10 DIAGNOSIS — D72829 Elevated white blood cell count, unspecified: Secondary | ICD-10-CM | POA: Diagnosis not present

## 2024-10-10 DIAGNOSIS — Z825 Family history of asthma and other chronic lower respiratory diseases: Secondary | ICD-10-CM

## 2024-10-10 DIAGNOSIS — Z8673 Personal history of transient ischemic attack (TIA), and cerebral infarction without residual deficits: Secondary | ICD-10-CM

## 2024-10-10 DIAGNOSIS — I2510 Atherosclerotic heart disease of native coronary artery without angina pectoris: Secondary | ICD-10-CM | POA: Diagnosis present

## 2024-10-10 DIAGNOSIS — Z800 Family history of malignant neoplasm of digestive organs: Secondary | ICD-10-CM

## 2024-10-10 DIAGNOSIS — I5032 Chronic diastolic (congestive) heart failure: Secondary | ICD-10-CM | POA: Diagnosis present

## 2024-10-10 DIAGNOSIS — E871 Hypo-osmolality and hyponatremia: Secondary | ICD-10-CM | POA: Diagnosis not present

## 2024-10-10 DIAGNOSIS — R600 Localized edema: Secondary | ICD-10-CM | POA: Diagnosis not present

## 2024-10-10 DIAGNOSIS — Z803 Family history of malignant neoplasm of breast: Secondary | ICD-10-CM

## 2024-10-10 DIAGNOSIS — Z7902 Long term (current) use of antithrombotics/antiplatelets: Secondary | ICD-10-CM | POA: Diagnosis not present

## 2024-10-10 DIAGNOSIS — D649 Anemia, unspecified: Secondary | ICD-10-CM | POA: Diagnosis not present

## 2024-10-10 DIAGNOSIS — Z885 Allergy status to narcotic agent status: Secondary | ICD-10-CM

## 2024-10-10 DIAGNOSIS — I2584 Coronary atherosclerosis due to calcified coronary lesion: Secondary | ICD-10-CM

## 2024-10-10 DIAGNOSIS — E876 Hypokalemia: Secondary | ICD-10-CM | POA: Diagnosis present

## 2024-10-10 DIAGNOSIS — Z833 Family history of diabetes mellitus: Secondary | ICD-10-CM

## 2024-10-10 DIAGNOSIS — I739 Peripheral vascular disease, unspecified: Secondary | ICD-10-CM | POA: Diagnosis present

## 2024-10-10 DIAGNOSIS — N1831 Chronic kidney disease, stage 3a: Secondary | ICD-10-CM | POA: Diagnosis present

## 2024-10-10 DIAGNOSIS — E785 Hyperlipidemia, unspecified: Secondary | ICD-10-CM | POA: Diagnosis present

## 2024-10-10 DIAGNOSIS — Z860101 Personal history of adenomatous and serrated colon polyps: Secondary | ICD-10-CM

## 2024-10-10 DIAGNOSIS — Z751 Person awaiting admission to adequate facility elsewhere: Secondary | ICD-10-CM

## 2024-10-10 DIAGNOSIS — Z9981 Dependence on supplemental oxygen: Secondary | ICD-10-CM | POA: Diagnosis not present

## 2024-10-10 DIAGNOSIS — R5381 Other malaise: Secondary | ICD-10-CM | POA: Diagnosis present

## 2024-10-10 DIAGNOSIS — R609 Edema, unspecified: Secondary | ICD-10-CM | POA: Diagnosis not present

## 2024-10-10 DIAGNOSIS — R7303 Prediabetes: Secondary | ICD-10-CM | POA: Diagnosis present

## 2024-10-10 DIAGNOSIS — Z880 Allergy status to penicillin: Secondary | ICD-10-CM

## 2024-10-10 DIAGNOSIS — I130 Hypertensive heart and chronic kidney disease with heart failure and stage 1 through stage 4 chronic kidney disease, or unspecified chronic kidney disease: Secondary | ICD-10-CM | POA: Diagnosis present

## 2024-10-10 DIAGNOSIS — Z8249 Family history of ischemic heart disease and other diseases of the circulatory system: Secondary | ICD-10-CM

## 2024-10-10 DIAGNOSIS — Z87891 Personal history of nicotine dependence: Secondary | ICD-10-CM

## 2024-10-10 DIAGNOSIS — Z881 Allergy status to other antibiotic agents status: Secondary | ICD-10-CM

## 2024-10-10 DIAGNOSIS — Z6831 Body mass index (BMI) 31.0-31.9, adult: Secondary | ICD-10-CM | POA: Diagnosis not present

## 2024-10-10 DIAGNOSIS — Z9049 Acquired absence of other specified parts of digestive tract: Secondary | ICD-10-CM

## 2024-10-10 DIAGNOSIS — Z79899 Other long term (current) drug therapy: Secondary | ICD-10-CM

## 2024-10-10 DIAGNOSIS — Z7951 Long term (current) use of inhaled steroids: Secondary | ICD-10-CM | POA: Diagnosis not present

## 2024-10-10 LAB — CBC WITH DIFFERENTIAL/PLATELET
Abs Immature Granulocytes: 0.14 K/uL — ABNORMAL HIGH (ref 0.00–0.07)
Basophils Absolute: 0 K/uL (ref 0.0–0.1)
Basophils Relative: 1 %
Eosinophils Absolute: 0.1 K/uL (ref 0.0–0.5)
Eosinophils Relative: 2 %
HCT: 32 % — ABNORMAL LOW (ref 36.0–46.0)
Hemoglobin: 10.5 g/dL — ABNORMAL LOW (ref 12.0–15.0)
Immature Granulocytes: 2 %
Lymphocytes Relative: 15 %
Lymphs Abs: 0.9 K/uL (ref 0.7–4.0)
MCH: 31.3 pg (ref 26.0–34.0)
MCHC: 32.8 g/dL (ref 30.0–36.0)
MCV: 95.2 fL (ref 80.0–100.0)
Monocytes Absolute: 0.5 K/uL (ref 0.1–1.0)
Monocytes Relative: 8 %
Neutro Abs: 4.5 K/uL (ref 1.7–7.7)
Neutrophils Relative %: 72 %
Platelets: 161 K/uL (ref 150–400)
RBC: 3.36 MIL/uL — ABNORMAL LOW (ref 3.87–5.11)
RDW: 12.6 % (ref 11.5–15.5)
WBC: 6.2 K/uL (ref 4.0–10.5)
nRBC: 0 % (ref 0.0–0.2)

## 2024-10-10 LAB — I-STAT CHEM 8, ED
BUN: 22 mg/dL (ref 8–23)
Calcium, Ion: 1.24 mmol/L (ref 1.15–1.40)
Chloride: 101 mmol/L (ref 98–111)
Creatinine, Ser: 1.1 mg/dL — ABNORMAL HIGH (ref 0.44–1.00)
Glucose, Bld: 121 mg/dL — ABNORMAL HIGH (ref 70–99)
HCT: 31 % — ABNORMAL LOW (ref 36.0–46.0)
Hemoglobin: 10.5 g/dL — ABNORMAL LOW (ref 12.0–15.0)
Potassium: 4 mmol/L (ref 3.5–5.1)
Sodium: 136 mmol/L (ref 135–145)
TCO2: 21 mmol/L — ABNORMAL LOW (ref 22–32)

## 2024-10-10 LAB — BASIC METABOLIC PANEL WITH GFR
Anion gap: 9 (ref 5–15)
BUN: 14 mg/dL (ref 8–23)
CO2: 14 mmol/L — ABNORMAL LOW (ref 22–32)
Calcium: 6.1 mg/dL — CL (ref 8.9–10.3)
Chloride: 120 mmol/L — ABNORMAL HIGH (ref 98–111)
Creatinine, Ser: 0.58 mg/dL (ref 0.44–1.00)
GFR, Estimated: >60 mL/min
Glucose, Bld: 83 mg/dL (ref 70–99)
Potassium: 2.6 mmol/L — CL (ref 3.5–5.1)
Sodium: 143 mmol/L (ref 135–145)

## 2024-10-10 LAB — RESP PANEL BY RT-PCR (RSV, FLU A&B, COVID)  RVPGX2
Influenza A by PCR: NEGATIVE
Influenza B by PCR: NEGATIVE
Resp Syncytial Virus by PCR: NEGATIVE
SARS Coronavirus 2 by RT PCR: NEGATIVE

## 2024-10-10 MED ORDER — POTASSIUM CHLORIDE 10 MEQ/100ML IV SOLN
10.0000 meq | INTRAVENOUS | Status: DC
  Administered 2024-10-10: 10 meq via INTRAVENOUS
  Filled 2024-10-10: qty 100

## 2024-10-10 MED ORDER — POTASSIUM CHLORIDE CRYS ER 20 MEQ PO TBCR
40.0000 meq | EXTENDED_RELEASE_TABLET | Freq: Once | ORAL | Status: AC
  Administered 2024-10-10: 40 meq via ORAL
  Filled 2024-10-10: qty 2

## 2024-10-10 MED ORDER — CALCIUM GLUCONATE-NACL 1-0.675 GM/50ML-% IV SOLN
1.0000 g | Freq: Once | INTRAVENOUS | Status: DC
  Administered 2024-10-10: 1000 mg via INTRAVENOUS
  Filled 2024-10-10: qty 50

## 2024-10-10 MED ORDER — ALBUTEROL SULFATE (2.5 MG/3ML) 0.083% IN NEBU
10.0000 mg/h | INHALATION_SOLUTION | Freq: Once | RESPIRATORY_TRACT | Status: AC
  Administered 2024-10-10: 10 mg/h via RESPIRATORY_TRACT
  Filled 2024-10-10: qty 3

## 2024-10-10 MED ORDER — MAGNESIUM SULFATE 2 GM/50ML IV SOLN
2.0000 g | Freq: Once | INTRAVENOUS | Status: AC
  Administered 2024-10-10: 2 g via INTRAVENOUS
  Filled 2024-10-10: qty 50

## 2024-10-10 MED ORDER — METHYLPREDNISOLONE SODIUM SUCC 125 MG IJ SOLR
125.0000 mg | Freq: Once | INTRAMUSCULAR | Status: AC
  Administered 2024-10-10: 125 mg via INTRAVENOUS
  Filled 2024-10-10: qty 2

## 2024-10-10 MED ORDER — IPRATROPIUM-ALBUTEROL 0.5-2.5 (3) MG/3ML IN SOLN
3.0000 mL | Freq: Once | RESPIRATORY_TRACT | Status: AC
  Administered 2024-10-10: 3 mL via RESPIRATORY_TRACT
  Filled 2024-10-10: qty 3

## 2024-10-10 NOTE — Assessment & Plan Note (Signed)
-  chronic avoid nephrotoxic medications such as NSAIDs, Vanco Zosyn combo,  avoid hypotension, continue to follow renal function

## 2024-10-10 NOTE — Assessment & Plan Note (Signed)
Continue Zocor 20 mg a day.

## 2024-10-10 NOTE — Assessment & Plan Note (Signed)
 Continue salt tablets and fluid restrictions

## 2024-10-10 NOTE — Assessment & Plan Note (Signed)
" -  -   Will initiate: Steroid taper  -  Antibiotics   Doxycycline , - Albuterol   PRN, - scheduled duoneb,    -  Mucinex .  Titrate O2 to saturation >90%. Follow patients respiratory status.  influenza PCR negative  VBG pending   Currently mentating well no evidence of symptomatic hypercarbia   "

## 2024-10-10 NOTE — Assessment & Plan Note (Signed)
 Continue atenolol  50 mg daily due Zocor  20 mg daily and Plavix 

## 2024-10-10 NOTE — Assessment & Plan Note (Signed)
 Contributing to comorbidity and complicating medical management  Body mass index is 30.34 kg/m.  Nutritional follow up as an out pt would be recommended

## 2024-10-10 NOTE — ED Triage Notes (Signed)
 Pt BIB EMS from home. Pt c/o SOB on exertion, lives on her own and unable to maintain O2 sats up even on oxygen. Per EMS pt 90% on 2L of oxygen.    EMS admin.  Albuterol  99% Neb

## 2024-10-10 NOTE — Assessment & Plan Note (Signed)
 Obtain anemia panel  Transfuse for Hg <7 , rapidly dropping or  if symptomatic

## 2024-10-10 NOTE — ED Notes (Signed)
 Pt assisted with bedpan

## 2024-10-10 NOTE — Assessment & Plan Note (Signed)
-   Continue as needed Ativan 

## 2024-10-10 NOTE — Assessment & Plan Note (Signed)
Continue Plavix and Zocor 

## 2024-10-10 NOTE — ED Notes (Signed)
 Paused Magnesium  due to IV burning. Iv site is tender but no signs on being infiltrated. Will get new IV placed.

## 2024-10-10 NOTE — Assessment & Plan Note (Signed)
-   Check TSH continue home medications Synthroid at 50 mcg po q day

## 2024-10-10 NOTE — ED Provider Notes (Signed)
 " [place] EMERGENCY DEPARTMENT AT [facility] Provider Note   CSN: [id number] Arrival date & time: 10/10/24  1709     Patient presents with: No chief complaint on file.   Shannon Roberts is a 73 y.o. female.  Past medical history significant for COPD, hypertension, CAD, and anemia presents today for shortness of breath.  Patient was evaluated on 12/14 and found to be hyponatremic at 118 and was admitted for 5 days.  Patient was not previously on oxygen at home but was discharged on 2 L for low saturations.  Patient was found by her home health nurse and SpO2 of 90% on 2 L nasal cannula.  EMS states patient had decreased breath sounds all throughout prior to DuoNeb treatment.  Patient now satting at approximately 96% on 2 L nasal cannula. Patient does live alone.    HPI     Prior to Admission medications  Medication Sig Start Date End Date Taking? Authorizing Provider  acetaminophen  (TYLENOL ) 325 MG tablet Take 2 tablets (650 mg total) by mouth every 6 (six) hours as needed for mild pain (pain score 1-3) or fever (or Fever >/= 101). 10/08/24   Barbarann Nest, MD  albuterol  (PROVENTIL ) (2.5 MG/3ML) 0.083% nebulizer solution Take 3 mLs (2.5 mg total) by nebulization every 6 (six) hours as needed for wheezing or shortness of breath. 07/01/24   Kara Dorn NOVAK, MD  amLODipine  (NORVASC ) 5 MG tablet Take 5 mg by mouth daily. 03/11/21   [provider]  atenolol  (TENORMIN ) 50 MG tablet Take 50 mg by mouth daily.    [provider]  Cholecalciferol (VITAMIN D3) 5000 units CAPS Take 5,000 Units by mouth See admin instructions. Take 5,000 units by mouth on Saturdays and Sundays    [provider]  clopidogrel  (PLAVIX ) 75 MG tablet Take 75 mg by mouth daily.     [provider]  DULoxetine  (CYMBALTA ) 30 MG capsule Take 60 mg by mouth in the morning.    [provider]  Evolocumab  (REPATHA  SURECLICK) 140 MG/ML SOAJ Inject 140 mg into the skin  every 14 (fourteen) days. INJECT UNDER THE SKIN EVERY 14 DAYS. Patient taking differently: Inject 140 mg into the skin every 14 (fourteen) days. 06/08/24   O'NealDarryle Ned, MD  ferrous sulfate  325 (65 FE) MG tablet Take 325 mg by mouth daily with breakfast.    [provider]  fluticasone  (FLONASE ) 50 MCG/ACT nasal spray Place 1 spray into both nostrils 2 (two) times daily as needed for allergies or rhinitis.    [provider]  Fluticasone -Umeclidin-Vilant (TRELEGY ELLIPTA ) 200-62.5-25 MCG/ACT AEPB Inhale 1 puff into the lungs daily. 04/22/24   Hope Almarie ORN, NP  Ipratropium-Albuterol  (COMBIVENT  RESPIMAT) 20-100 MCG/ACT AERS respimat Inhale 1 puff into the lungs every 4 (four) hours as needed for wheezing or shortness of breath. 08/07/21   Kara Dorn NOVAK, MD  isosorbide  mononitrate (IMDUR ) 30 MG 24 hr tablet Take 30 mg by mouth in the morning and at bedtime. 09/24/14   [provider]  levothyroxine  (SYNTHROID ) 50 MCG tablet Take 50 mcg by mouth daily before breakfast.    [provider]  [Paused] lisinopril  (PRINIVIL ,ZESTRIL ) 40 MG tablet Take 40 mg by mouth daily. Wait to take this until your doctor or other care provider tells you to start again.    [provider]  LORazepam  (ATIVAN ) 0.5 MG tablet Take 0.5-0.75 mg by mouth every 6 (six) hours as needed for anxiety.  [provider]  MUCINEX  600 MG 12 hr tablet Take 600 mg by mouth 2 (two) times daily as needed for to loosen phlegm.    [provider]  ondansetron  (ZOFRAN -ODT) 4 MG disintegrating tablet Take 1 tablet (4 mg total) by mouth every 8 (eight) hours as needed for nausea or vomiting. Patient taking differently: Take 4 mg by mouth every 8 (eight) hours as needed for nausea or vomiting (dissolve orally). 09/29/24   Logan Ubaldo NOVAK, PA-C  pantoprazole  (PROTONIX ) 40 MG tablet Take 40 mg by mouth daily before breakfast.    [provider]  polyethylene  glycol powder (GLYCOLAX /MIRALAX ) 17 GM/SCOOP powder Take 17 g by mouth daily. Dissolve 1 capful (17g) in 4-8 ounces of liquid and take by mouth daily. 10/08/24   Barbarann Nest, MD  simvastatin  (ZOCOR ) 20 MG tablet Take 40 mg by mouth at bedtime. 06/06/22   [provider]  sodium chloride  1 g tablet Take 2 tablets (2 g total) by mouth 3 (three) times daily with meals for 7 days. 10/08/24 10/15/24  Barbarann Nest, MD    Allergies: Azithromycin , Carvedilol, Codeine , Buspirone, Dapsone, Niacin and related, Paroxetine hcl, Septra [sulfamethoxazole-trimethoprim], Sertraline hcl, Amoxicillin, and Wellbutrin [bupropion]    Review of Systems  Respiratory:  Positive for shortness of breath.     Updated Vital Signs BP 135/68   Pulse 78   Temp 97.7 F (36.5 C) (Oral)   Resp 12   Ht 5' 5 (1.651 m)   Wt 82.7 kg   SpO2 96%   BMI 30.34 kg/m   Physical Exam Vitals and nursing note reviewed.  Constitutional:      General: She is not in acute distress.    Appearance: She is well-developed. She is not toxic-appearing.  HENT:     Head: Normocephalic and atraumatic.     Right Ear: External ear normal.     Left Ear: External ear normal.     Mouth/Throat:     Mouth: Mucous membranes are moist.     Pharynx: Oropharynx is clear.  Eyes:     Conjunctiva/sclera: Conjunctivae normal.  Cardiovascular:     Rate and Rhythm: Normal rate and regular rhythm.     Heart sounds: No murmur heard. Pulmonary:     Effort: Pulmonary effort is normal. No respiratory distress.     Breath sounds: Examination of the right-upper field reveals wheezing. Examination of the left-upper field reveals wheezing. Examination of the right-middle field reveals wheezing. Examination of the left-middle field reveals wheezing. Examination of the right-lower field reveals decreased breath sounds. Examination of the left-lower field reveals decreased breath sounds. Decreased breath sounds and wheezing present.  Abdominal:      Palpations: Abdomen is soft.     Tenderness: There is no abdominal tenderness.  Musculoskeletal:        General: No swelling.     Cervical back: Neck supple.  Skin:    General: Skin is warm and dry.     Capillary Refill: Capillary refill takes less than 2 seconds.  Neurological:     General: No focal deficit present.     Mental Status: She is alert and oriented to person, place, and time.  Psychiatric:        Mood and Affect: Mood normal.     (all labs ordered are listed, but only abnormal results are displayed) Labs Reviewed  BASIC METABOLIC PANEL WITH GFR - Abnormal; Notable for the following components:      Result Value  Potassium 2.6 (*)    Chloride 120 (*)    CO2 14 (*)    Calcium  6.1 (*)    All other components within normal limits  CBC WITH DIFFERENTIAL/PLATELET - Abnormal; Notable for the following components:   RBC 3.36 (*)    Hemoglobin 10.5 (*)    HCT 32.0 (*)    Abs Immature Granulocytes 0.14 (*)    All other components within normal limits  I-STAT CHEM 8, ED - Abnormal; Notable for the following components:   Creatinine, Ser 1.10 (*)    Glucose, Bld 121 (*)    TCO2 21 (*)    Hemoglobin 10.5 (*)    HCT 31.0 (*)    All other components within normal limits  RESP PANEL BY RT-PCR (RSV, FLU A&B, COVID)  RVPGX2  MAGNESIUM   COMPREHENSIVE METABOLIC PANEL WITH GFR  BLOOD GAS, VENOUS    EKG: EKG Interpretation Date/Time:  Sunday October 10 2024 17:36:46 EST Ventricular Rate:  79 PR Interval:  174 QRS Duration:  142 QT Interval:  422 QTC Calculation: 484 R Axis:   -58  Text Interpretation: Sinus rhythm RBBB and LAFB Probable left ventricular hypertrophy No significant change since last tracing Confirmed by Towana Sharper [phone]) on 10/10/2024 5:40:33 PM  Radiology: [facility] Chest Port 1 View Result Date: 10/10/2024 CLINICAL DATA:  Shortness of breath. EXAM: PORTABLE CHEST 1 VIEW COMPARISON:  Chest radiograph dated 10/08/2024. FINDINGS: Background of  emphysema. No focal consolidation, pleural effusion, or pneumothorax. The cardiac silhouette is within normal limits. No acute osseous pathology. IMPRESSION: No active disease. Electronically Signed   By: Vanetta Chou M.D.   On: 10/10/2024 17:54     Procedures   Medications Ordered in the ED  methylPREDNISolone  sodium succinate (SOLU-MEDROL ) 125 mg/2 mL injection 125 mg (125 mg Intravenous Given 10/10/24 1823)  magnesium  sulfate IVPB 2 g 50 mL (0 g Intravenous Stopped 10/10/24 1929)  ipratropium-albuterol  (DUONEB) 0.5-2.5 (3) MG/3ML nebulizer solution 3 mL (3 mLs Nebulization Given 10/10/24 1833)  potassium chloride  SA (KLOR-CON  M) CR tablet 40 mEq (40 mEq Oral Given 10/10/24 2026)  albuterol  (PROVENTIL ) (2.5 MG/3ML) 0.083% nebulizer solution (10 mg/hr Nebulization Given 10/10/24 2037)                                    Medical Decision Making Amount and/or Complexity of Data Reviewed Labs: ordered. Radiology: ordered.  Risk Prescription drug management. Decision regarding hospitalization.   This patient presents to the ED for concern of shortness of breath, this involves an extensive number of treatment options, and is a complaint that carries with it a high risk of complications and morbidity.  The differential diagnosis includes COVID, flu, RSV, COPD exacerbation, pneumonia   Co morbidities / Chronic conditions that complicate the patient evaluation  COPD   Additional history obtained:  Additional history obtained from EMR External records from outside source obtained and reviewed including previous admission documents   Lab Tests:  I Ordered, and personally interpreted labs.  The pertinent results include: Hypokalemia at 2.6, elevated chloride at 120, reduced CO2 at 14, hypocalcemia 6.1, anemia at 10.5, i-STAT with normal potassium, elevated glucose at 121, creatinine at 1.10 which is around patient's baseline   Imaging Studies ordered:  I ordered imaging  studies including chest x-ray I independently visualized and interpreted imaging which showed no active disease I agree with the radiologist interpretation   Cardiac Monitoring: / EKG:  The  patient was maintained on a cardiac monitor.  I personally viewed and interpreted the cardiac monitored which showed an underlying rhythm of: Sinus rhythm, RBBB   Problem List / ED Course / Critical interventions / Medication management I ordered medication including DuoNeb, Solu-Medrol , magnesium  I have reviewed the patients home medicines and have made adjustments as needed Reassessment of patient after continuous neb showed decreased airway movement and wheezing throughout.  Patient dyspneic and pursed lip breathing just from sitting up in bed.   Consultations Obtained:  I requested consultation with the Hospitalist, Dr. Silvester and discussed lab and imaging findings as well as pertinent plan - they recommend: Admission   Test / Admission - Considered:  Admit     Final diagnoses:  COPD exacerbation [facility])    ED Discharge Orders     None          Francis Ileana SAILOR, PA-C 10/10/24 2245  "

## 2024-10-10 NOTE — Assessment & Plan Note (Signed)
 Obtain Doppler to further evaluate Given risk factors of decreased mobility and recent COVID

## 2024-10-10 NOTE — Subjective & Objective (Signed)
 Patient comes in from home with shortness of breath worse with exertion noted to be 90% on 2 L she has history of COPD  Recent admission for hyponatremia on the 14th when she was found to have a sodium 118 at that time she was admitted for 5 days and discharged at the time of discharge she was discharged on 2 L of nasal cannula  Today it seems that she has been having trouble shortness of breath and was having low oxygen saturations even on 2 L She has been having more wheezing

## 2024-10-10 NOTE — H&P (Signed)
 "    Shannon Roberts FMW:[id number] DOB: 01-Nov-1950 DOA: 10/10/2024     PCP: Okey Carlin Redbird, MD     Patient arrived to ER on 10/10/24 at 1709 Referred by Attending Towana Ozell BROCKS, MD   Patient coming from:    home Lives alone,      Chief Complaint: Shortness of breath dyspnea on exertion   HPI: Shannon Roberts is a 73 y.o. female with medical history significant of COPD hyponatremia, GERD, hypertension, HLD and anxiety, history of stroke CKD 3 AA, hypothyroidism, obesity CAD, SIADH,,    Presented with   worsening shortness of breath wheezing Patient comes in from home with shortness of breath worse with exertion noted to be 90% on 2 L she has history of COPD  Recent admission for hyponatremia on the 14th when she was found to have a sodium 118 at that time she was admitted for 5 days and discharged at the time of discharge she was discharged on 2 L of nasal cannula  Today it seems that she has been having trouble shortness of breath and was having low oxygen saturations even on 2 L She has been having more wheezing    During recent admission her hyponatremia felt to be secondary to SIADH and volume depletion secondary to COVID infection She was started on 1000 cc fluid restriction and salt tablets and sodium went up to 130 at the time of discharge on 819 She was discharged on sodium tablets 3 times daily 1 g and continue to fluid restrict to at least a week  Her recent COVID infection has contributed to COPD exacerbation and during hospitalization was not that she was desatting down to 88% with ambulation that is why she was discharged on oxygen she was supposed to follow-up with pulmonology in 4 weeks unfortunately she presented back to emergency department in 2 days of discharge  Patient felt like she was not ready to go home after her discharge never been on oxygen before feels like that made it worse.  When she got home at her she did okay but very shortly after became  more short of breath.  Reports increased wheezing she feels that this is all after she has had hard time recovering from her COVID.  Denies any new sick contacts Worsening cough no fevers or chills no chest pain  Denies significant ETOH intake   Does not smoke     Regarding pertinent Chronic problems:     Hyperlipidemia -  on statins Repatha  simvastatin  Lipid Panel     Component Value Date/Time   CHOL 92 (L) 05/30/2022 0822   TRIG 106 05/30/2022 0822   HDL 44 05/30/2022 0822   CHOLHDL 2.1 05/30/2022 0822   CHOLHDL 3.4 01/02/2014 2255   VLDL 20 01/02/2014 2255   LDLCALC 28 05/30/2022 0822   LABVLDL 20 05/30/2022 0822     HTN on Norvasc  atenolol    chronic CHF diastolic - last echo  Recent Results (from the past 56199 hours)  ECHOCARDIOGRAM COMPLETE   Collection Time: 06/01/24  8:22 AM  Result Value   Area-P 1/2 4.74   S' Lateral 2.70   Est EF 55   Narrative      ECHOCARDIOGRAM REPORT      IMPRESSIONS    1. Left ventricular ejection fraction, by estimation, is 55%. The left ventricle has normal function. The left ventricle demonstrates regional wall motion abnormalities with possible apical septal hypokinesis. Left ventricular diastolic parameters are  consistent with Grade  I diastolic dysfunction (impaired relaxation).  2. Right ventricular systolic function is normal. The right ventricular size is normal. Tricuspid regurgitation signal is inadequate for assessing PA pressure.  3. The mitral valve is normal in structure. No evidence of mitral valve regurgitation. No evidence of mitral stenosis.  4. The aortic valve is tricuspid. There is mild calcification of the aortic valve. Aortic valve regurgitation is not visualized. No aortic stenosis is present.  5. The inferior vena cava is normal in size with greater than 50% respiratory variability, suggesting right atrial pressure of 3 mmHg.            CAD  - On Aspirin , statin, betablocker, Plavix                  -  *followed by cardiology                - last cardiac cath        DM 2 -  Lab Results  Component Value Date   HGBA1C 6.0 (H) 01/02/2014    PO meds only    Hypothyroidism:   Lab Results  Component Value Date   TSH 1.430 10/04/2024   on synthroid     obesity-   BMI Readings from Last 1 Encounters:  10/10/24 30.34 kg/m        COPD -  not  on baseline oxygen only started since discharge      Hx of CVA - *with/out residual deficits on  Plavix       CKD stage IIIa  baseline Cr 1.3 Estimated Creatinine Clearance: 47.7 mL/min (A) (by C-G formula based on SCr of 1.1 mg/dL (H)).  Lab Results  Component Value Date   CREATININE 1.10 (H) 10/10/2024   CREATININE 0.58 10/10/2024   CREATININE 1.29 (H) 10/07/2024   Lab Results  Component Value Date   NA 136 10/10/2024   CL 101 10/10/2024   K 4.0 10/10/2024   CO2 14 (L) 10/10/2024   BUN 22 10/10/2024   CREATININE 1.10 (H) 10/10/2024   GFRNONAA >60 10/10/2024   CALCIUM  6.1 (LL) 10/10/2024   ALBUMIN  3.7 10/04/2024   GLUCOSE 121 (H) 10/10/2024    Chronic anemia - baseline hg Hemoglobin & Hematocrit  Recent Labs    10/04/24 0300 10/10/24 1838 10/10/24 2021  HGB 11.8* 10.5* 10.5*   Iron/TIBC/Ferritin/ %Sat    Component Value Date/Time   IRON 65 07/05/2019 1230   FERRITIN 35.4 07/05/2019 1230   IRONPCTSAT 17.1 (L) 07/05/2019 1230      While in ER:   Was given Solu-Medrol  and magnesium  as well as DuoNebs COVID-negative Noted to have potassium down to 2.6 she was given IV potassium Continue to have significant wheezing and hospitalist was consulted for COPD exacerbation    Lab Orders         Resp panel by RT-PCR (RSV, Flu A&B, Covid) Anterior Nasal Swab         Basic metabolic panel         CBC with Differential/Platelet         Magnesium          I-stat chem 8, ED (not at [facility], [facility] or [facility])       CXR -  NON acute    Following Medications were ordered in ER: Medications  methylPREDNISolone  sodium succinate  (SOLU-MEDROL ) 125 mg/2 mL injection 125 mg (125 mg Intravenous Given 10/10/24 1823)  magnesium  sulfate IVPB 2 g 50 mL (0 g Intravenous Stopped 10/10/24 1929)  ipratropium-albuterol  (DUONEB)  0.5-2.5 (3) MG/3ML nebulizer solution 3 mL (3 mLs Nebulization Given 10/10/24 1833)  potassium chloride  SA (KLOR-CON  M) CR tablet 40 mEq (40 mEq Oral Given 10/10/24 2026)  albuterol  (PROVENTIL ) (2.5 MG/3ML) 0.083% nebulizer solution (10 mg/hr Nebulization Given 10/10/24 2037)    _______    ED Triage Vitals  Encounter Vitals Group     BP 10/10/24 1723 (!) 147/72     Girls Systolic BP Percentile --      Girls Diastolic BP Percentile --      Boys Systolic BP Percentile --      Boys Diastolic BP Percentile --      Pulse Rate 10/10/24 1723 83     Resp 10/10/24 1723 19     Temp 10/10/24 1723 97.9 F (36.6 C)     Temp Source 10/10/24 1723 Oral     SpO2 10/10/24 1723 96 %     Weight 10/10/24 1736 182 lb 5.1 oz (82.7 kg)     Height 10/10/24 1736 5' 5 (1.651 m)     Head Circumference --      Peak Flow --      Pain Score 10/10/24 1736 0     Pain Loc --      Pain Education --      Exclude from Growth Chart --   UFJK(75)@     _________________________________________ Significant initial  Findings: Abnormal Labs Reviewed  BASIC METABOLIC PANEL WITH GFR - Abnormal; Notable for the following components:      Result Value   Potassium 2.6 (*)    Chloride 120 (*)    CO2 14 (*)    Calcium  6.1 (*)    All other components within normal limits  CBC WITH DIFFERENTIAL/PLATELET - Abnormal; Notable for the following components:   RBC 3.36 (*)    Hemoglobin 10.5 (*)    HCT 32.0 (*)    Abs Immature Granulocytes 0.14 (*)    All other components within normal limits  I-STAT CHEM 8, ED - Abnormal; Notable for the following components:   Creatinine, Ser 1.10 (*)    Glucose, Bld 121 (*)    TCO2 21 (*)    Hemoglobin 10.5 (*)    HCT 31.0 (*)    All other components within normal limits     ECG:  Ordered Personally reviewed and interpreted by me showing: HR : 79 Rhythm: Sinus rhythm RBBB and LAFB Probable left ventricular hypertrophy No significant change since last tracing QTC 484    The recent clinical data is shown below. Vitals:   10/10/24 2030 10/10/24 2041 10/10/24 2045 10/10/24 2100  BP: (!) 145/64  132/77 135/68  Pulse: 76  80 78  Resp: 12 12 16 12   Temp:  97.7 F (36.5 C)    TempSrc:  Oral    SpO2: 93%  99% 96%  Weight:      Height:         WBC     Component Value Date/Time   WBC 6.2 10/10/2024 1838   LYMPHSABS 0.9 10/10/2024 1838   MONOABS 0.5 10/10/2024 1838   EOSABS 0.1 10/10/2024 1838   BASOSABS 0.0 10/10/2024 1838         Results for orders placed or performed during the hospital encounter of 10/10/24  Resp panel by RT-PCR (RSV, Flu A&B, Covid) Anterior Nasal Swab     Status: None   Collection Time: 10/10/24  6:35 PM   Specimen: Anterior Nasal Swab  Result Value Ref Range Status  SARS Coronavirus 2 by RT PCR NEGATIVE NEGATIVE Final         Influenza A by PCR NEGATIVE NEGATIVE Final   Influenza B by PCR NEGATIVE NEGATIVE Final         Resp Syncytial Virus by PCR NEGATIVE NEGATIVE Final          VBG pending   __________________________________________________________ Recent Labs  Lab 10/05/24 2053 10/06/24 9376 10/06/24 0903 10/06/24 1524 10/06/24 2053 10/07/24 0550 10/07/24 1041 10/10/24 1838 10/10/24 2021  NA 128* 128* 128*   < > 126* 130* 128* 143 136  K 4.2 4.3 4.2  --   --  4.7  --  2.6* 4.0  CO2 21* 23 21*  --   --  20*  --  14*  --   GLUCOSE 119* 130* 149*  --   --  125*  --  83 121*  BUN 19 16 16   --   --  18  --  14 22  CREATININE 1.25* 1.26* 1.25*  --   --  1.29*  --  0.58 1.10*  CALCIUM  9.9 9.3 9.2  --   --  9.8  --  6.1*  --    < > = values in this interval not displayed.    Cr    stable,    Lab Results  Component Value Date   CREATININE 1.10 (H) 10/10/2024   CREATININE 0.58 10/10/2024   CREATININE 1.29 (H)  10/07/2024    Recent Labs  Lab 10/04/24 0300  AST 34  ALT 31  ALKPHOS 60  BILITOT 0.5  PROT 6.4*  ALBUMIN  3.7   Lab Results  Component Value Date   CALCIUM  6.1 (LL) 10/10/2024    Plt: Lab Results  Component Value Date   PLT 161 10/10/2024       Recent Labs  Lab 10/04/24 0300 10/10/24 1838 10/10/24 2021  WBC 11.5* 6.2  --   NEUTROABS  --  4.5  --   HGB 11.8* 10.5* 10.5*  HCT 33.4* 32.0* 31.0*  MCV 87.7 95.2  --   PLT 352 161  --     HG/HCT   stable,      Component Value Date/Time   HGB 10.5 (L) 10/10/2024 2021   HCT 31.0 (L) 10/10/2024 2021   MCV 95.2 10/10/2024 1838   MCV 96.0 01/02/2014 1825    _______________________________________________ Hospitalist was called for admission for   COPD exacerbation     The following Work up has been ordered so far:  Orders Placed This Encounter  Procedures   Resp panel by RT-PCR (RSV, Flu A&B, Covid) Anterior Nasal Swab   DG Chest Port 1 View   Basic metabolic panel   CBC with Differential/Platelet   Magnesium    ED Cardiac monitoring   Initiate Carrier Fluid Protocol   Consult to hospitalist   ED Pulse oximetry, continuous   I-stat chem 8, ED (not at [facility], [facility] or [facility])   ED EKG - No EKG has been ordered for this patient.   EKG 12-Lead   Insert peripheral IV     OTHER Significant initial  Findings:  labs showing:     DM  labs:  HbA1C: No results for input(s): HGBA1C in the last 8760 hours.     CBG (last 3)  No results for input(s): GLUCAP in the last 72 hours.        Cultures: No results found for: SDES, SPECREQUEST, CULT, REPTSTATUS   Radiological Exams on Admission: DG Chest  Port 1 View Result Date: 10/10/2024 CLINICAL DATA:  Shortness of breath. EXAM: PORTABLE CHEST 1 VIEW COMPARISON:  Chest radiograph dated 10/08/2024. FINDINGS: Background of emphysema. No focal consolidation, pleural effusion, or pneumothorax. The cardiac silhouette is within normal limits. No acute osseous  pathology. IMPRESSION: No active disease. Electronically Signed   By: Vanetta Chou M.D.   On: 10/10/2024 17:54   _______________________________________________________________________________________________________ Latest  Blood pressure 135/68, pulse 78, temperature 97.7 F (36.5 C), temperature source Oral, resp. rate 12, height 5' 5 (1.651 m), weight 82.7 kg, SpO2 96%.   Vitals  labs and radiology finding personally reviewed  Review of Systems:    Pertinent positives include:  shortness of breath at rest dyspnea on exertion,  Constitutional:  No weight loss, night sweats, Fevers, chills, fatigue, weight loss  HEENT:  No headaches, Difficulty swallowing,Tooth/dental problems,Sore throat,  No sneezing, itching, ear ache, nasal congestion, post nasal drip,  Cardio-vascular:  No chest pain, Orthopnea, PND, anasarca, dizziness, palpitations.no Bilateral lower extremity swelling  GI:  No heartburn, indigestion, abdominal pain, nausea, vomiting, diarrhea, change in bowel habits, loss of appetite, melena, blood in stool, hematemesis Resp:  no  No excess mucus, no productive cough, No non-productive cough, No coughing up of blood.No change in color of mucus.No wheezing. Skin:  no rash or lesions. No jaundice GU:  no dysuria, change in color of urine, no urgency or frequency. No straining to urinate.  No flank pain.  Musculoskeletal:  No joint pain or no joint swelling. No decreased range of motion. No back pain.  Psych:  No change in mood or affect. No depression or anxiety. No memory loss.  Neuro: no localizing neurological complaints, no tingling, no weakness, no double vision, no gait abnormality, no slurred speech, no confusion  All systems reviewed and apart from HOPI all are negative _______________________________________________________________________________________________ Past Medical History:   Past Medical History:  Diagnosis Date   Allergic rhinitis    Anemia     per hosp. - recently- pt. told that she is anemic   Anxiety    Colitis    COPD (chronic obstructive pulmonary disease) (HCC)    no o2   Depression    Diverticulosis    Female bladder prolapse    Fibroids    GERD (gastroesophageal reflux disease)    Heart murmur    Hx of adenomatous colonic polyps 08/26/2019   Hyperlipemia    Hypertension    IBS (irritable bowel syndrome)    Insomnia    Lip abscess 04/05/2016   Mitral valve prolapse    Peripheral vascular disease    Pre-diabetes 08/03/2019   Diet controlled     Right bundle branch block    Sciatic nerve disease    Shortness of breath    Stroke [facility]) March 2015      Past Surgical History:  Procedure Laterality Date   CAROTID ENDARTERECTOMY Right January 17, 2014   CE   CHOLECYSTECTOMY     COLONOSCOPY     ENDARTERECTOMY Right 01/17/2014   Procedure: RIGHT CAROTID ARTERY ENDARTERECTOMY WITH DACRON PATCH ANGIOPLASTY;  Surgeon: Krystal JULIANNA Doing, MD;  Location: [facility] OR;  Service: Vascular;  Laterality: Right;   UPPER GASTROINTESTINAL ENDOSCOPY      Social History:  Ambulatory  walker    reports that she quit smoking about 10 years ago. Her smoking use included cigarettes. She started smoking about 57 years ago. She has a 70.6 pack-year smoking history. She has never used smokeless tobacco. She reports that she  does not drink alcohol and does not use drugs.     Family History:   Family History  Problem Relation Age of Onset   Breast cancer Mother    Ovarian cancer Mother    AAA (abdominal aortic aneurysm) Mother    Emphysema Mother    Lung cancer Father    Emphysema Paternal Aunt    Pancreatic cancer Maternal Grandmother    Diabetes Maternal Aunt    Breast cancer Maternal Aunt    Heart disease Paternal Grandfather    Diabetes Paternal Grandfather    Kidney disease Maternal Aunt    Colon cancer Paternal Aunt    Colon polyps Neg Hx    Esophageal cancer Neg Hx    Rectal cancer Neg Hx    Stomach cancer Neg Hx     ______________________________________________________________________________________________ Allergies: Allergies[1]   Prior to Admission medications  Medication Sig Start Date End Date Taking? Authorizing Provider  acetaminophen  (TYLENOL ) 325 MG tablet Take 2 tablets (650 mg total) by mouth every 6 (six) hours as needed for mild pain (pain score 1-3) or fever (or Fever >/= 101). 10/08/24   Barbarann Nest, MD  albuterol  (PROVENTIL ) (2.5 MG/3ML) 0.083% nebulizer solution Take 3 mLs (2.5 mg total) by nebulization every 6 (six) hours as needed for wheezing or shortness of breath. 07/01/24   Kara Dorn NOVAK, MD  amLODipine  (NORVASC ) 5 MG tablet Take 5 mg by mouth daily. 03/11/21   [provider]  atenolol  (TENORMIN ) 50 MG tablet Take 50 mg by mouth daily.    [provider]  Cholecalciferol (VITAMIN D3) 5000 units CAPS Take 5,000 Units by mouth See admin instructions. Take 5,000 units by mouth on Saturdays and Sundays    [provider]  clopidogrel  (PLAVIX ) 75 MG tablet Take 75 mg by mouth daily.     [provider]  DULoxetine  (CYMBALTA ) 30 MG capsule Take 60 mg by mouth in the morning.    [provider]  Evolocumab  (REPATHA  SURECLICK) 140 MG/ML SOAJ Inject 140 mg into the skin every 14 (fourteen) days. INJECT UNDER THE SKIN EVERY 14 DAYS. Patient taking differently: Inject 140 mg into the skin every 14 (fourteen) days. 06/08/24   O'NealDarryle Ned, MD  ferrous sulfate  325 (65 FE) MG tablet Take 325 mg by mouth daily with breakfast.    [provider]  fluticasone  (FLONASE ) 50 MCG/ACT nasal spray Place 1 spray into both nostrils 2 (two) times daily as needed for allergies or rhinitis.    [provider]  Fluticasone -Umeclidin-Vilant (TRELEGY ELLIPTA ) 200-62.5-25 MCG/ACT AEPB Inhale 1 puff into the lungs daily. 04/22/24   Hope Almarie ORN, NP  Ipratropium-Albuterol  (COMBIVENT  RESPIMAT) 20-100 MCG/ACT AERS respimat Inhale  1 puff into the lungs every 4 (four) hours as needed for wheezing or shortness of breath. 08/07/21   Kara Dorn NOVAK, MD  isosorbide  mononitrate (IMDUR ) 30 MG 24 hr tablet Take 30 mg by mouth in the morning and at bedtime. 09/24/14   [provider]  levothyroxine  (SYNTHROID ) 50 MCG tablet Take 50 mcg by mouth daily before breakfast.    [provider]  [Paused] lisinopril  (PRINIVIL ,ZESTRIL ) 40 MG tablet Take 40 mg by mouth daily. Wait to take this until your doctor or other care provider tells you to start again.    [provider]  LORazepam  (ATIVAN ) 0.5 MG tablet Take 0.5-0.75 mg by mouth every 6 (six) hours as needed for anxiety.    [provider]  MUCINEX  600 MG 12 hr  tablet Take 600 mg by mouth 2 (two) times daily as needed for to loosen phlegm.    [provider]  ondansetron  (ZOFRAN -ODT) 4 MG disintegrating tablet Take 1 tablet (4 mg total) by mouth every 8 (eight) hours as needed for nausea or vomiting. Patient taking differently: Take 4 mg by mouth every 8 (eight) hours as needed for nausea or vomiting (dissolve orally). 09/29/24   Logan Ubaldo NOVAK, PA-C  pantoprazole  (PROTONIX ) 40 MG tablet Take 40 mg by mouth daily before breakfast.    [provider]  polyethylene glycol powder (GLYCOLAX /MIRALAX ) 17 GM/SCOOP powder Take 17 g by mouth daily. Dissolve 1 capful (17g) in 4-8 ounces of liquid and take by mouth daily. 10/08/24   Barbarann Nest, MD  simvastatin  (ZOCOR ) 20 MG tablet Take 40 mg by mouth at bedtime. 06/06/22   [provider]  sodium chloride  1 g tablet Take 2 tablets (2 g total) by mouth 3 (three) times daily with meals for 7 days. 10/08/24 10/15/24  Barbarann Nest, MD    ___________________________________________________________________________________________________ Physical Exam:    10/10/2024    9:00 PM 10/10/2024    8:45 PM 10/10/2024    8:30 PM  Vitals with BMI  Systolic 135 132 854  Diastolic 68  77 64  Pulse 78 80 76     1. General:  in No  Acute distress   Chronically ill   -appearing 2. Psychological: Alert and   Oriented 3. Head/ENT:   Dry Mucous Membranes                          Head Non traumatic, neck supple                    Poor Dentition 4. SKIN:  decreased Skin turgor,  Skin clean Dry and intact no rash    5. Heart: Regular rate and rhythm no  Murmur, no Rub or gallop 6. Lungs: some wheezes or crackles   7. Abdomen: Soft,  non-tender, Non distended  bowel sounds present 8. Lower extremities: no clubbing, cyanosis,  edema right > left 9. Neurologically Grossly intact, moving all 4 extremities equally   10. MSK: Normal range of motion    Chart has been reviewed  ______________________________________________________________________________________________  Assessment/Plan 73 y.o. female with medical history significant of COPD hyponatremia, GERD, hypertension, HLD and anxiety, history of stroke CKD 3 AA, hypothyroidism, obesity CAD, SIADH,,   Admitted for   COPD exacerbation    Present on Admission:  COPD exacerbation (HCC)  Anemia  Anxiety disorder  CAD (coronary artery disease)  Chronic kidney disease, stage 3a (HCC)  Hyperlipidemia  Hyponatremia  Hypothyroidism     Anemia Obtain anemia panel  Transfuse for Hg <7 , rapidly dropping or  if symptomatic   Anxiety disorder Continue as needed Ativan   CAD (coronary artery disease) Continue atenolol  50 mg daily due Zocor  20 mg daily and Plavix   Chronic kidney disease, stage 3a (HCC)  -chronic avoid nephrotoxic medications such as NSAIDs, Vanco Zosyn combo,  avoid hypotension, continue to follow renal function   COPD exacerbation (HCC)  -  - Will initiate: Steroid taper  -  Antibiotics   Doxycycline , - Albuterol   PRN, - scheduled duoneb,    -  Mucinex .  Titrate O2 to saturation >90%. Follow patients respiratory status.  influenza PCR negative  VBG pending   Currently mentating well no  evidence of symptomatic hypercarbia    Class 1 obesity due  to excess calories with body mass index (BMI) of 31.0 to 31.9 in adult Contributing to comorbidity and complicating medical management  Body mass index is 30.34 kg/m.  Nutritional follow up as an out pt would be recommended   Hyperlipidemia Continue Zocor  20 mg a day  Hyponatremia Continue salt tablets and fluid restrictions  Hypothyroidism - Check TSH continue home medications Synthroid  at 50mcg po q day   History of CVA (cerebrovascular accident) Continue Plavix  and Zocor   Leg edema, right Obtain Doppler to further evaluate Given risk factors of decreased mobility and recent COVID    Other plan as per orders.  DVT prophylaxis:  SCD    Code Status:    Code Status: Prior FULL CODE  as per patient   I had personally discussed CODE STATUS with patient   ACP   none  Family Communication:   Family  at  Bedside  plan of care was discussed on the phone with  , Daughter,    Diet heart healthy with fluid restrictions   Disposition Plan:      To home once workup is complete and patient is stable   Following barriers for discharge:                                                          Electrolytes corrected                                                           Will need to be able to tolerate PO                            Will likely need home health, home O2, set up     Consult Orders  (From admission, onward)           Start     Ordered   10/10/24 2202  Consult to hospitalist  Once       Provider:  (Not yet assigned)  Question Answer Comment  Place call to: [facility]   Reason for Consult Admit      10/10/24 2202                               Would benefit from PT/OT eval prior to DC  Ordered                                       Consults called:    NONE   Admission status:  ED Disposition     ED Disposition  Admit   Condition  --   Comment  Hospital Area: [facility] [[id number]]  Level of Care: Progressive [102]  Admit to Progressive based on following criteria: RESPIRATORY PROBLEMS hypoxemic/hypercapnic respiratory failure that is responsive to NIPPV (BiPAP) or High Flow Nasal Cannula (6-80 lpm). Frequent assessment/intervention, no > Q2 hrs < Q4 hrs, to maintain oxygenation and pulmonary hygiene.  May admit patient  to [facility] or [facility] if equivalent level of care is available:: No  Diagnosis: COPD exacerbation [facility]) [[id number]]  Admitting Physician: Yaziel Brandon [[id number]]  Attending Physician: Pavel Gadd [[id number]]  Certification:: I certify this patient will need inpatient services for at least 2 midnights  Expected Medical Readiness: 10/13/2024            inpatient     I Expect 2 midnight stay secondary to severity of patient's current illness need for inpatient interventions justified by the following:  hemodynamic instability despite optimal treatment (  tachypnea  hypoxia,    Severe lab/radiological/exam abnormalities including:    COPD exacerbation    and extensive comorbidities including:  COPD/asthma   Obesity   That are currently affecting medical management.   I expect  patient to be hospitalized for 2 midnights requiring inpatient medical care.  Patient is at high risk for adverse outcome (such as loss of life or disability) if not treated.  Indication for inpatient stay as follows:   New or worsening hypoxia    Need for IV antibiotics, IV fluids    Level of care       progressive      Elisandra Deshmukh 10/10/2024, 11:22 PM    [facility]     after 2 AM please page floor coverage   If 7AM-7PM, please contact the day team taking care of the patient using [email or web address]        [1]  Allergies Allergen Reactions   Azithromycin  Other (See Comments)    Pt states she cannot take this due to her heart medication and heart condition   Carvedilol Palpitations   Codeine  Shortness  Of Breath   Buspirone Other (See Comments)    Did not work   Dapsone Other (See Comments)    Flushing    Niacin And Related Other (See Comments)    Flushing    Paroxetine Hcl Other (See Comments)    Dizziness and dreams   Septra [Sulfamethoxazole-Trimethoprim] Diarrhea and Nausea And Vomiting   Sertraline Hcl Other (See Comments)    Reported skin crawling and a fuzzy head   Amoxicillin Rash and Other (See Comments)    FLUSHED FEELING   Wellbutrin [Bupropion] Anxiety and Other (See Comments)    Caused the patient to feel jittery   "

## 2024-10-11 ENCOUNTER — Inpatient Hospital Stay (HOSPITAL_COMMUNITY)

## 2024-10-11 DIAGNOSIS — R609 Edema, unspecified: Secondary | ICD-10-CM

## 2024-10-11 DIAGNOSIS — J441 Chronic obstructive pulmonary disease with (acute) exacerbation: Secondary | ICD-10-CM | POA: Diagnosis not present

## 2024-10-11 LAB — COMPREHENSIVE METABOLIC PANEL WITH GFR
ALT: 28 U/L (ref 0–44)
ALT: 24 U/L (ref 0–44)
AST: 23 U/L (ref 15–41)
AST: 20 U/L (ref 15–41)
Albumin: 3.9 g/dL (ref 3.5–5.0)
Albumin: 3.8 g/dL (ref 3.5–5.0)
Alkaline Phosphatase: 61 U/L (ref 38–126)
Alkaline Phosphatase: 59 U/L (ref 38–126)
Anion gap: 13 (ref 5–15)
Anion gap: 12 (ref 5–15)
BUN: 21 mg/dL (ref 8–23)
BUN: 21 mg/dL (ref 8–23)
CO2: 22 mmol/L (ref 22–32)
CO2: 23 mmol/L (ref 22–32)
Calcium: 10.1 mg/dL (ref 8.9–10.3)
Calcium: 10.2 mg/dL (ref 8.9–10.3)
Chloride: 100 mmol/L (ref 98–111)
Chloride: 100 mmol/L (ref 98–111)
Creatinine, Ser: 1.1 mg/dL — ABNORMAL HIGH (ref 0.44–1.00)
Creatinine, Ser: 1.03 mg/dL — ABNORMAL HIGH (ref 0.44–1.00)
GFR, Estimated: 52 mL/min — ABNORMAL LOW
GFR, Estimated: 57 mL/min — ABNORMAL LOW
Glucose, Bld: 164 mg/dL — ABNORMAL HIGH (ref 70–99)
Glucose, Bld: 154 mg/dL — ABNORMAL HIGH (ref 70–99)
Potassium: 4.4 mmol/L (ref 3.5–5.1)
Potassium: 4.7 mmol/L (ref 3.5–5.1)
Sodium: 135 mmol/L (ref 135–145)
Sodium: 136 mmol/L (ref 135–145)
Total Bilirubin: 0.5 mg/dL (ref 0.0–1.2)
Total Bilirubin: 0.4 mg/dL (ref 0.0–1.2)
Total Protein: 7 g/dL (ref 6.5–8.1)
Total Protein: 6.7 g/dL (ref 6.5–8.1)

## 2024-10-11 LAB — BLOOD GAS, VENOUS
Acid-base deficit: 1.2 mmol/L (ref 0.0–2.0)
Bicarbonate: 23.6 mmol/L (ref 20.0–28.0)
O2 Saturation: 84.7 %
Patient temperature: 37
pCO2, Ven: 39 mmHg — ABNORMAL LOW (ref 44–60)
pH, Ven: 7.39 (ref 7.25–7.43)
pO2, Ven: 52 mmHg — ABNORMAL HIGH (ref 32–45)

## 2024-10-11 LAB — CBC
HCT: 30.9 % — ABNORMAL LOW (ref 36.0–46.0)
Hemoglobin: 10.4 g/dL — ABNORMAL LOW (ref 12.0–15.0)
MCH: 31.2 pg (ref 26.0–34.0)
MCHC: 33.7 g/dL (ref 30.0–36.0)
MCV: 92.8 fL (ref 80.0–100.0)
Platelets: 257 K/uL (ref 150–400)
RBC: 3.33 MIL/uL — ABNORMAL LOW (ref 3.87–5.11)
RDW: 12.4 % (ref 11.5–15.5)
WBC: 6.2 K/uL (ref 4.0–10.5)
nRBC: 0 % (ref 0.0–0.2)

## 2024-10-11 LAB — PHOSPHORUS: Phosphorus: 4 mg/dL (ref 2.5–4.6)

## 2024-10-11 LAB — MAGNESIUM
Magnesium: 2.4 mg/dL (ref 1.7–2.4)
Magnesium: 2.3 mg/dL (ref 1.7–2.4)

## 2024-10-11 LAB — D-DIMER, QUANTITATIVE: D-Dimer, Quant: 0.99 ug{FEU}/mL — ABNORMAL HIGH (ref 0.00–0.50)

## 2024-10-11 MED ORDER — BUDESON-GLYCOPYRROL-FORMOTEROL 160-9-4.8 MCG/ACT IN AERO
2.0000 | INHALATION_SPRAY | Freq: Two times a day (BID) | RESPIRATORY_TRACT | Status: DC
  Administered 2024-10-11 – 2024-10-23 (×24): 2 via RESPIRATORY_TRACT
  Filled 2024-10-11: qty 5.9

## 2024-10-11 MED ORDER — SODIUM CHLORIDE 0.9% FLUSH
3.0000 mL | Freq: Two times a day (BID) | INTRAVENOUS | Status: DC
  Administered 2024-10-11 – 2024-10-23 (×24): 3 mL via INTRAVENOUS

## 2024-10-11 MED ORDER — SODIUM CHLORIDE 0.9% FLUSH
3.0000 mL | INTRAVENOUS | Status: DC | PRN
  Administered 2024-10-12 – 2024-10-17 (×3): 3 mL via INTRAVENOUS

## 2024-10-11 MED ORDER — DULOXETINE HCL 60 MG PO CPEP
60.0000 mg | ORAL_CAPSULE | Freq: Every morning | ORAL | Status: DC
  Administered 2024-10-11 – 2024-10-23 (×13): 60 mg via ORAL
  Filled 2024-10-11 (×7): qty 1

## 2024-10-11 MED ORDER — ISOSORBIDE MONONITRATE ER 30 MG PO TB24
30.0000 mg | ORAL_TABLET | Freq: Every day | ORAL | Status: DC
  Administered 2024-10-11: 30 mg via ORAL
  Filled 2024-10-11: qty 1

## 2024-10-11 MED ORDER — SODIUM CHLORIDE 1 G PO TABS
2.0000 g | ORAL_TABLET | Freq: Three times a day (TID) | ORAL | Status: DC
  Administered 2024-10-11 – 2024-10-18 (×19): 2 g via ORAL
  Filled 2024-10-11 (×19): qty 2

## 2024-10-11 MED ORDER — ACETAMINOPHEN 650 MG RE SUPP: 650.0000 mg | Freq: Four times a day (QID) | RECTAL | Status: DC | PRN

## 2024-10-11 MED ORDER — ONDANSETRON HCL 4 MG/2ML IJ SOLN: 4.0000 mg | Freq: Four times a day (QID) | INTRAMUSCULAR | Status: DC | PRN

## 2024-10-11 MED ORDER — AMLODIPINE BESYLATE 10 MG PO TABS
5.0000 mg | ORAL_TABLET | Freq: Every day | ORAL | Status: DC
  Administered 2024-10-11 – 2024-10-23 (×13): 5 mg via ORAL
  Filled 2024-10-11 (×7): qty 1

## 2024-10-11 MED ORDER — FENTANYL CITRATE (PF) 50 MCG/ML IJ SOSY: 12.5000 ug | PREFILLED_SYRINGE | INTRAMUSCULAR | Status: DC | PRN

## 2024-10-11 MED ORDER — METHYLPREDNISOLONE SODIUM SUCC 40 MG IJ SOLR
40.0000 mg | Freq: Two times a day (BID) | INTRAMUSCULAR | Status: AC
  Administered 2024-10-11 (×2): 40 mg via INTRAVENOUS
  Filled 2024-10-11 (×2): qty 1

## 2024-10-11 MED ORDER — SODIUM CHLORIDE 0.9 % IV SOLN: 250.0000 mL | INTRAVENOUS | Status: AC | PRN

## 2024-10-11 MED ORDER — LEVOTHYROXINE SODIUM 50 MCG PO TABS
50.0000 ug | ORAL_TABLET | Freq: Every day | ORAL | Status: DC
  Administered 2024-10-11 – 2024-10-23 (×12): 50 ug via ORAL
  Filled 2024-10-11 (×6): qty 1

## 2024-10-11 MED ORDER — HYDROCODONE-ACETAMINOPHEN 5-325 MG PO TABS
1.0000 | ORAL_TABLET | ORAL | Status: DC | PRN
  Administered 2024-10-11: 1 via ORAL
  Filled 2024-10-11: qty 1

## 2024-10-11 MED ORDER — POLYETHYLENE GLYCOL 3350 17 G PO PACK
17.0000 g | PACK | Freq: Every day | ORAL | Status: DC
  Administered 2024-10-11 – 2024-10-17 (×6): 17 g via ORAL
  Filled 2024-10-11 (×7): qty 1

## 2024-10-11 MED ORDER — ALBUTEROL SULFATE (2.5 MG/3ML) 0.083% IN NEBU
2.5000 mg | INHALATION_SOLUTION | RESPIRATORY_TRACT | Status: DC | PRN
  Administered 2024-10-19: 2.5 mg via RESPIRATORY_TRACT

## 2024-10-11 MED ORDER — ACETAMINOPHEN 325 MG PO TABS
650.0000 mg | ORAL_TABLET | Freq: Four times a day (QID) | ORAL | Status: DC | PRN
  Administered 2024-10-11 – 2024-10-23 (×8): 650 mg via ORAL
  Filled 2024-10-11 (×5): qty 2

## 2024-10-11 MED ORDER — GUAIFENESIN ER 600 MG PO TB12
600.0000 mg | ORAL_TABLET | Freq: Two times a day (BID) | ORAL | Status: DC | PRN
  Administered 2024-10-20 – 2024-10-21 (×4): 600 mg via ORAL

## 2024-10-11 MED ORDER — CLOPIDOGREL BISULFATE 75 MG PO TABS
75.0000 mg | ORAL_TABLET | Freq: Every day | ORAL | Status: DC
  Administered 2024-10-11 – 2024-10-23 (×13): 75 mg via ORAL
  Filled 2024-10-11 (×7): qty 1

## 2024-10-11 MED ORDER — PANTOPRAZOLE SODIUM 40 MG PO TBEC
40.0000 mg | DELAYED_RELEASE_TABLET | Freq: Every day | ORAL | Status: DC
  Administered 2024-10-11 – 2024-10-23 (×13): 40 mg via ORAL
  Filled 2024-10-11 (×7): qty 1

## 2024-10-11 MED ORDER — ISOSORBIDE MONONITRATE ER 30 MG PO TB24
30.0000 mg | ORAL_TABLET | Freq: Two times a day (BID) | ORAL | Status: DC
  Administered 2024-10-11 – 2024-10-23 (×24): 30 mg via ORAL
  Filled 2024-10-11 (×13): qty 1

## 2024-10-11 MED ORDER — ONDANSETRON HCL 4 MG PO TABS: 4.0000 mg | ORAL_TABLET | Freq: Four times a day (QID) | ORAL | Status: DC | PRN

## 2024-10-11 MED ORDER — FERROUS SULFATE 325 (65 FE) MG PO TABS
325.0000 mg | ORAL_TABLET | Freq: Every day | ORAL | Status: DC
  Administered 2024-10-11 – 2024-10-23 (×13): 325 mg via ORAL
  Filled 2024-10-11 (×7): qty 1

## 2024-10-11 MED ORDER — LORAZEPAM 0.5 MG PO TABS
0.5000 mg | ORAL_TABLET | Freq: Four times a day (QID) | ORAL | Status: DC | PRN
  Administered 2024-10-11: 0.5 mg via ORAL
  Administered 2024-10-11 – 2024-10-12 (×3): 0.75 mg via ORAL
  Administered 2024-10-13: 0.5 mg via ORAL
  Administered 2024-10-13 – 2024-10-15 (×4): 0.75 mg via ORAL
  Administered 2024-10-15: 0.5 mg via ORAL
  Administered 2024-10-16 – 2024-10-18 (×4): 0.75 mg via ORAL
  Administered 2024-10-18: 0.5 mg via ORAL
  Administered 2024-10-19 – 2024-10-22 (×5): 0.75 mg via ORAL
  Filled 2024-10-11 (×4): qty 2
  Filled 2024-10-11: qty 1
  Filled 2024-10-11 (×3): qty 2
  Filled 2024-10-11 (×2): qty 1
  Filled 2024-10-11 (×3): qty 2

## 2024-10-11 MED ORDER — IPRATROPIUM-ALBUTEROL 0.5-2.5 (3) MG/3ML IN SOLN
3.0000 mL | Freq: Four times a day (QID) | RESPIRATORY_TRACT | Status: DC
  Administered 2024-10-11 – 2024-10-12 (×4): 3 mL via RESPIRATORY_TRACT
  Filled 2024-10-11 (×5): qty 3

## 2024-10-11 MED ORDER — DOXYCYCLINE HYCLATE 100 MG PO TABS
100.0000 mg | ORAL_TABLET | Freq: Two times a day (BID) | ORAL | Status: AC
  Administered 2024-10-11 – 2024-10-17 (×14): 100 mg via ORAL
  Filled 2024-10-11 (×14): qty 1

## 2024-10-11 MED ORDER — PREDNISONE 20 MG PO TABS
40.0000 mg | ORAL_TABLET | Freq: Every day | ORAL | Status: AC
  Administered 2024-10-12 – 2024-10-15 (×4): 40 mg via ORAL
  Filled 2024-10-11 (×4): qty 2

## 2024-10-11 MED ORDER — ATENOLOL 50 MG PO TABS
50.0000 mg | ORAL_TABLET | Freq: Every day | ORAL | Status: DC
  Administered 2024-10-11 – 2024-10-23 (×13): 50 mg via ORAL
  Filled 2024-10-11 (×7): qty 1

## 2024-10-11 MED ORDER — SIMVASTATIN 20 MG PO TABS
40.0000 mg | ORAL_TABLET | Freq: Every day | ORAL | Status: DC
  Administered 2024-10-11 – 2024-10-22 (×12): 40 mg via ORAL
  Filled 2024-10-11 (×7): qty 2

## 2024-10-11 NOTE — Evaluation (Signed)
 Physical Therapy Evaluation Patient Details Name: Shannon Roberts MRN: [id number] DOB: 01/02/1951 Today's Date: 10/11/2024  History of Present Illness  Pt is a 73 y/o female admitted for COPD exacerbation, acute resp failure PMH: COPD, hyponatremia, GERD, hypertension, HLD, anxiety, CVA, CKD, hypothyroidism, CAD, falls, shoulder surgery  Clinical Impression  On eval, pt was CGA for mobility. She ambulated ~10 feet around the room without a device. Pt is unsteady-will require continued use of RW for ambulation safety once she is ready to progress activity into hallway. Pt reports she had just returned to bed after receiving ativan . She become quite anxious when asked to mobilize. O2 92% on 2L [place] O2 with activity during session, 96% on 2L [place] end of session. Will continue to follow pt during this hospital stay. Will recommend HHPT.          If plan is discharge home, recommend the following: Assistance with cooking/housework;Assist for transportation;Help with stairs or ramp for entrance   Can travel by private vehicle        Equipment Recommendations None recommended by PT  Recommendations for Other Services       Functional Status Assessment Patient has had a recent decline in their functional status and demonstrates the ability to make significant improvements in function in a reasonable and predictable amount of time.     Precautions / Restrictions Precautions Precautions: Fall Precaution/Restrictions Comments: monitor O2 Restrictions Weight Bearing Restrictions Per Provider Order: No      Mobility  Bed Mobility Overal bed mobility: Needs Assistance Bed Mobility: Supine to Sit, Sit to Supine     Supine to sit: Supervision, HOB elevated, Used rails Sit to supine: Supervision, HOB elevated, Used rails   General bed mobility comments: Supv for lines    Transfers Overall transfer level: Needs assistance Equipment used: [place] Transfers: Sit to/from Stand Sit to  Stand: Contact guard assist           General transfer comment: mildly unsteady. cues for pursed lip/deep breathing.    Ambulation/Gait Ambulation/Gait assistance: Contact guard assist Gait Distance (Feet): 10 Feet Assistive device: None Gait Pattern/deviations: Step-through pattern, Decreased stride length       General Gait Details: Pt ambulated a short distance around room without an assistive device. She is unsteady. O2 92% on 2L, audible wheezing with activity. Cues for pursed lip breathing.  Stairs            Wheelchair Mobility     Tilt Bed    Modified Rankin (Stroke Patients Only)       Balance Overall balance assessment: Needs assistance, History of Falls         Standing balance support: During functional activity, No upper extremity supported Standing balance-Leahy Scale: Poor Standing balance comment: will still require RW for ambulation safety once ready to progress activity                             Pertinent Vitals/Pain Pain Assessment Pain Assessment: No/denies pain    Home Living Family/patient expects to be discharged to:: Private residence Living Arrangements: Alone Available Help at Discharge: Family;Available PRN/intermittently Type of Home: House Home Access: Stairs to enter Entrance Stairs-Rails: Left Entrance Stairs-Number of Steps: 3   Home Layout: One level Home Equipment: Shower seat;Cane - single point;Rolling Walker (2 wheels)      Prior Function Prior Level of Function : Independent/Modified Independent;Driving  Mobility Comments: using RW, O2 most recently-since last admission. ADLs Comments: She was independent with ADLs, cooking, cleaning and driving.     Extremity/Trunk Assessment   Upper Extremity Assessment Upper Extremity Assessment: Defer to OT evaluation    Lower Extremity Assessment Lower Extremity Assessment: Generalized weakness    Cervical / Trunk  Assessment Cervical / Trunk Assessment: Normal  Communication   Communication Communication: No apparent difficulties    Cognition Arousal: Alert Behavior During Therapy: WFL for tasks assessed/performed, Anxious                             Following commands: Intact       Cueing Cueing Techniques: Verbal cues     General Comments General comments (skin integrity, edema, etc.): SpO2 94% on 3 L O2. pt endorsing some SOB but recovering well with seated rest break    Exercises     Assessment/Plan    PT Assessment Patient needs continued PT services  PT Problem List Decreased strength;Decreased activity tolerance;Decreased balance;Decreased mobility;Decreased cognition;Decreased knowledge of use of DME       PT Treatment Interventions DME instruction;Gait training;Stair training;Functional mobility training;Therapeutic activities;Therapeutic exercise;Balance training;Patient/family education    PT Goals (Current goals can be found in the Care Plan section)  Acute Rehab PT Goals Patient Stated Goal: breathe better PT Goal Formulation: With patient Time For Goal Achievement: 10/25/24 Potential to Achieve Goals: Good    Frequency Min 3X/week     Co-evaluation               AM-PAC PT 6 Clicks Mobility  Outcome Measure Help needed turning from your back to your side while in a flat bed without using bedrails?: None Help needed moving from lying on your back to sitting on the side of a flat bed without using bedrails?: A Little Help needed moving to and from a bed to a chair (including a wheelchair)?: A Little Help needed standing up from a chair using your arms (e.g., wheelchair or bedside chair)?: A Little Help needed to walk in hospital room?: A Little Help needed climbing 3-5 steps with a railing? : A Lot 6 Click Score: 18    End of Session Equipment Utilized During Treatment: Oxygen Activity Tolerance: Patient limited by fatigue (limited by  anxiety) Patient left: in bed;with call bell/phone within reach;with bed alarm set   PT Visit Diagnosis: History of falling (Z91.81);Unsteadiness on feet (R26.81);Difficulty in walking, not elsewhere classified (R26.2)    Time: 8567-8552 PT Time Calculation (min) (ACUTE ONLY): 15 min   Charges:   PT Evaluation $PT Eval Low Complexity: 1 Low   PT General Charges $$ ACUTE PT VISIT: 1 Visit           Dannial SQUIBB, PT Acute Rehabilitation  Office: [phone]

## 2024-10-11 NOTE — Progress Notes (Signed)
 VASCULAR LAB    Right lower extremity venous duplex has been performed.  See CV proc for preliminary results.   Odester Nilson, RVT 10/11/2024, 10:38 AM

## 2024-10-11 NOTE — Progress Notes (Signed)
 " PROGRESS NOTE    [name]  FMW:[id number] DOB: [birth year] DOA: 10/10/2024 PCP: Okey Carlin Redbird, MD    Brief Narrative:   Shannon Roberts is a 73 y.o. female with past medical history significant for HTN, HLD, CKD stage IIIa, hypothyroidism, COPD, SIADH, obesity, anxiety, recent COVID-19 viral infection who presented to [facility] ED on 10/10/2024 via EMS from home with complaints of progressive shortness of breath, worse with exertion.  Denies any sick contacts, no fevers, no chills, no chest pain.  Patient recently admitted for hyponatremia secondary to SIADH in which she was found to have a sodium of 119, patient was started on salt tablets and fluid restriction with improvement of sodium to 130 at time of discharge.  Additionally, patient was also discharged on 2 L nasal cannula related to her COPD and recent COVID infection.  In the ED, temperature 97.9 F, HR 83, RR 19, BP 147/72, SpO2 96% on 2L [place].  WBC 6.2, hemoglobin 10.5, plate count 838.  Sodium 143, potassium 2.6, chloride 120, CO2 14, glucose 83, BUN 14, creatinine 0.58.  Calcium  6.1.  Anion gap 9.  COVID/influenza/RSV PCR negative.  Chest x-ray with no active cardiopulmonary disease process.  Patient was started on IV Solu-Medrol , given IV magnesium  DuoNebs, potassium supplements and albuterol  neb.  TRH consulted for admission for further evaluation and management of COPD exacerbation, concern for community-acquired pneumonia.  Assessment & Plan:   Acute hypoxic respiratory failure, POA COPD exacerbation Patient presenting with progressive shortness of breath, not oxygen dependent at baseline although was recently discharged on home oxygen from recent Covid-19 viral infection.  Has underlying COPD, follows with pulmonology Dr. Kara outpatient.  Patient is afebrile without leukocytosis.  Chest x-ray with no focal consolidation. -- Doxycycline  100 mg p.o. twice daily -- Breztri  2 puffs twice daily (substituted  for home Trelegy Ellipta ) -- Solu-Medrol  40 mg IV q12h x 1 day; followed by prednisone  40 mg p.o. daily -- DuoNeb every 6 hours scheduled -- Albuterol  neb every 2 hours.  Wheezing/shortness of breath -- Continue supplemental oxygen to maintain SpO2 greater than 88%; O2 walk screen tomorrow  HTN CAD HLD -- Atenolol  50 g p.o. daily --Isosorbide  mononitrate 30 m p.o. daily -- Plavix  75 mg p.o. daily -- Simvastatin  40 mg p.o. daily  CKD stage IIIa -- Cr 1.10; stable  Hypothyroidism -- Levothyroxine  50 mcg p.o. daily  SIADH -- Na 135>136 -- Sodium chloride  2 g p.o. 3 times daily AC -- 1200 mL fluid restriction -- BMP in am  GERD -- Protonix  40 mg p.o. daily  Anxiety -- Lorazepam  0.5-0.75 mg p.o. every 6 hours as needed anxiety  Obesity, class I Body mass index is 30.34 kg/m.     DVT prophylaxis: SCDs Start: 10/11/24 0343    Code Status: Full Code Family Communication: No family present at bedside this morning  Disposition Plan:  Level of care: Progressive Status is: Inpatient Remains inpatient appropriate because: Scheduled neb treatments, weaning from supplemental oxygen    Consultants:  None  Procedures:  Vas duplex ultrasound right lower extremity: Negative for DVT  Antimicrobials:  Doxycycline  12/22   Subjective: Patient seen examined bedside, lying in bed.  Remains on 2 L nasal cannula.  Reports dyspnea slightly improved.  Continues on breathing treatments, IV steroids.  Asking about her anxiety medicine, discussed with her that it is ordered as needed.  No other questions or concerns at this time.  Denies headache, no dizziness, no chest pain, no palpitations,  no fever/chills/night sweats, no nausea/vomiting/diarrhea, no focal weakness, no fatigue, no paresthesias.  No acute events overnight per nurse staff.  Objective: Vitals:   10/11/24 0056 10/11/24 0505 10/11/24 0507 10/11/24 0919  BP: 136/71 (!) 155/93 (!) 150/78 134/65  Pulse: 85 89  82  Resp:  18 19 15 18   Temp: (!) 97.5 F (36.4 C) 97.6 F (36.4 C)  97.9 F (36.6 C)  TempSrc: Oral Oral  Oral  SpO2: 94% 98%  99%  Weight:      Height:        Intake/Output Summary (Last 24 hours) at 10/11/2024 1201 Last data filed at 10/11/2024 0856 Gross per 24 hour  Intake 69.24 ml  Output --  Net 69.24 ml   Filed Weights   10/10/24 1736  Weight: 82.7 kg    Examination:  Physical Exam: GEN: NAD, alert and oriented x 3, obese HEENT: NCAT, PERRL, EOMI, sclera clear, MMM PULM: Breath sounds diminished bilateral bases with mildly expiratory wheezing, no crackles, normal respiratory effort without accessory muscle use, on 2 L nasal cannula SpO2 99% at rest CV: RRR w/o M/G/R GI: abd soft, NTND, + BS MSK: Trace bilateral lower extremity peripheral edema, moves all extremities independently with preserved muscle strength NEURO: No focal neurological deficit PSYCH: normal mood/affect Integumentary: No concerning rashes/lesions/wounds noted on exposed skin surfaces    Data Reviewed: I have personally reviewed following labs and imaging studies  CBC: Recent Labs  Lab 10/10/24 1838 10/10/24 2021 10/11/24 0658  WBC 6.2  --  6.2  NEUTROABS 4.5  --   --   HGB 10.5* 10.5* 10.4*  HCT 32.0* 31.0* 30.9*  MCV 95.2  --  92.8  PLT 161  --  257   Basic Metabolic Panel: Recent Labs  Lab 10/06/24 0903 10/06/24 1524 10/07/24 0550 10/07/24 1041 10/10/24 1838 10/10/24 2021 10/11/24 0020 10/11/24 0658  NA 128*   < > 130* 128* 143 136 135 136  K 4.2  --  4.7  --  2.6* 4.0 4.4 4.7  CL 96*  --  98  --  120* 101 100 100  CO2 21*  --  20*  --  14*  --  22 23  GLUCOSE 149*  --  125*  --  83 121* 164* 154*  BUN 16  --  18  --  14 22 21 21   CREATININE 1.25*  --  1.29*  --  0.58 1.10* 1.10* 1.03*  CALCIUM  9.2  --  9.8  --  6.1*  --  10.1 10.2  MG  --   --   --   --   --   --  2.4 2.3  PHOS  --   --   --   --   --   --   --  4.0   < > = values in this interval not displayed.    GFR: Estimated Creatinine Clearance: 50.9 mL/min (A) (by C-G formula based on SCr of 1.03 mg/dL (H)). Liver Function Tests: Recent Labs  Lab 10/11/24 0020 10/11/24 0658  AST 23 20  ALT 28 24  ALKPHOS 61 59  BILITOT 0.5 0.4  PROT 7.0 6.7  ALBUMIN  3.9 3.8   No results for input(s): LIPASE, AMYLASE in the last 168 hours. No results for input(s): AMMONIA in the last 168 hours. Coagulation Profile: No results for input(s): INR, PROTIME in the last 168 hours. Cardiac Enzymes: No results for input(s): CKTOTAL, CKMB, CKMBINDEX, TROPONINI in the last 168 hours. BNP (  last 3 results) Recent Labs    10/03/24 1444  PROBNP 532.0*   HbA1C: No results for input(s): HGBA1C in the last 72 hours. CBG: No results for input(s): GLUCAP in the last 168 hours. Lipid Profile: No results for input(s): CHOL, HDL, LDLCALC, TRIG, CHOLHDL, LDLDIRECT in the last 72 hours. Thyroid  Function Tests: No results for input(s): TSH, T4TOTAL, FREET4, T3FREE, THYROIDAB in the last 72 hours. Anemia Panel: No results for input(s): VITAMINB12, FOLATE, FERRITIN, TIBC, IRON, RETICCTPCT in the last 72 hours. Sepsis Labs: No results for input(s): PROCALCITON, LATICACIDVEN in the last 168 hours.  Recent Results (from the past 240 hours)  MRSA Next Gen by PCR, Nasal     Status: None   Collection Time: 10/03/24  9:45 PM   Specimen: Nasal Mucosa; Nasal Swab  Result Value Ref Range Status   MRSA by PCR Next Gen NOT DETECTED NOT DETECTED Final    Comment: (NOTE) The GeneXpert MRSA Assay (FDA approved for NASAL specimens only), is one component of a comprehensive MRSA colonization surveillance program. It is not intended to diagnose MRSA infection nor to guide or monitor treatment for MRSA infections. Test performance is not FDA approved in patients less than 4 years old. Performed at [facility], [address], [place], [place]  [address]   Resp panel by RT-PCR (RSV, Flu A&B, Covid) Anterior Nasal Swab     Status: None   Collection Time: 10/10/24  6:35 PM   Specimen: Anterior Nasal Swab  Result Value Ref Range Status   SARS Coronavirus 2 by RT PCR NEGATIVE NEGATIVE Final    Comment: (NOTE) SARS-CoV-2 target nucleic acids are NOT DETECTED.  The SARS-CoV-2 RNA is generally detectable in upper respiratory specimens during the acute phase of infection. The lowest concentration of SARS-CoV-2 viral copies this assay can detect is 138 copies/mL. A negative result does not preclude SARS-Cov-2 infection and should not be used as the sole basis for treatment or other patient management decisions. A negative result may occur with  improper specimen collection/handling, submission of specimen other than nasopharyngeal swab, presence of viral mutation(s) within the areas targeted by this assay, and inadequate number of viral copies(<138 copies/mL). A negative result must be combined with clinical observations, patient history, and epidemiological information. The expected result is Negative.  Fact Sheet for Patients:  bloggercourse.com  Fact Sheet for Healthcare Providers:  seriousbroker.it  This test is no t yet approved or cleared by the [place]  FDA and  has been authorized for detection and/or diagnosis of SARS-CoV-2 by FDA under an Emergency Use Authorization (EUA). This EUA will remain  in effect (meaning this test can be used) for the duration of the COVID-19 declaration under Section 564(b)(1) of the Act, 21 U.S.C.section 360bbb-3(b)(1), unless the authorization is terminated  or revoked sooner.       Influenza A by PCR NEGATIVE NEGATIVE Final   Influenza B by PCR NEGATIVE NEGATIVE Final    Comment: (NOTE) The Xpert Xpress SARS-CoV-2/FLU/RSV plus assay is intended as an aid in the diagnosis of influenza from Nasopharyngeal swab specimens and should not  be used as a sole basis for treatment. Nasal washings and aspirates are unacceptable for Xpert Xpress SARS-CoV-2/FLU/RSV testing.  Fact Sheet for Patients: bloggercourse.com  Fact Sheet for Healthcare Providers: seriousbroker.it  This test is not yet approved or cleared by the [place]  FDA and has been authorized for detection and/or diagnosis of SARS-CoV-2 by FDA under an Emergency Use Authorization (EUA). This EUA will  remain in effect (meaning this test can be used) for the duration of the COVID-19 declaration under Section 564(b)(1) of the Act, 21 U.S.C. section 360bbb-3(b)(1), unless the authorization is terminated or revoked.     Resp Syncytial Virus by PCR NEGATIVE NEGATIVE Final    Comment: (NOTE) Fact Sheet for Patients: bloggercourse.com  Fact Sheet for Healthcare Providers: seriousbroker.it  This test is not yet approved or cleared by the [place]  FDA and has been authorized for detection and/or diagnosis of SARS-CoV-2 by FDA under an Emergency Use Authorization (EUA). This EUA will remain in effect (meaning this test can be used) for the duration of the COVID-19 declaration under Section 564(b)(1) of the Act, 21 U.S.C. section 360bbb-3(b)(1), unless the authorization is terminated or revoked.  Performed at [facility], [address], [place], [place] [address]          Radiology Studies: VAS US  LOWER EXTREMITY VENOUS (DVT) Result Date: 10/11/2024  Lower Venous DVT Study Patient Name:  ANNALEESE GUIER  Date of Exam:   10/11/2024 Medical Rec #: [id number]         Accession #:    [id number] Date of Birth: [birth year]         Patient Gender: F Patient Age:   50 years Exam Location:  [facility] Procedure:      VAS US  LOWER EXTREMITY VENOUS (DVT) Referring Phys: BLEASE DOUTOVA  --------------------------------------------------------------------------------  Indications: Swelling, and Recent Covid infection.  Comparison Study: Prior negative bilateral LEV done 01/31/17 Performing Technologist: Alberta Lis RVS  Examination Guidelines: A complete evaluation includes B-mode imaging, spectral Doppler, color Doppler, and power Doppler as needed of all accessible portions of each vessel. Bilateral testing is considered an integral part of a complete examination. Limited examinations for reoccurring indications may be performed as noted. The reflux portion of the exam is performed with the patient in reverse Trendelenburg.  +---------+---------------+---------+-----------+----------+--------------+ RIGHT    CompressibilityPhasicitySpontaneityPropertiesThrombus Aging +---------+---------------+---------+-----------+----------+--------------+ CFV      Full           Yes      Yes                                 +---------+---------------+---------+-----------+----------+--------------+ SFJ      Full                                                        +---------+---------------+---------+-----------+----------+--------------+ FV Prox  Full           Yes      Yes                                 +---------+---------------+---------+-----------+----------+--------------+ FV Mid   Full           Yes      Yes                                 +---------+---------------+---------+-----------+----------+--------------+ FV DistalFull                                                        +---------+---------------+---------+-----------+----------+--------------+  PFV      Full                                                        +---------+---------------+---------+-----------+----------+--------------+ POP      Full           Yes      Yes                                 +---------+---------------+---------+-----------+----------+--------------+  PTV      Full                                                        +---------+---------------+---------+-----------+----------+--------------+ PERO     Full                                                        +---------+---------------+---------+-----------+----------+--------------+ Gastroc  Full                                                        +---------+---------------+---------+-----------+----------+--------------+ SSV      Full                                                        +---------+---------------+---------+-----------+----------+--------------+   +----+---------------+---------+-----------+----------+--------------+ LEFTCompressibilityPhasicitySpontaneityPropertiesThrombus Aging +----+---------------+---------+-----------+----------+--------------+ CFV Full           Yes      Yes                                 +----+---------------+---------+-----------+----------+--------------+ SFJ Full                                                        +----+---------------+---------+-----------+----------+--------------+    Summary: RIGHT: - There is no evidence of deep vein thrombosis in the lower extremity.  - No cystic structure found in the popliteal fossa.  LEFT: - No evidence of common femoral vein obstruction.   *See table(s) above for measurements and observations.    Preliminary    DG Chest Port 1 View Result Date: 10/10/2024 CLINICAL DATA:  Shortness of breath. EXAM: PORTABLE CHEST 1 VIEW COMPARISON:  Chest radiograph dated 10/08/2024. FINDINGS: Background of emphysema. No focal consolidation, pleural effusion, or pneumothorax. The cardiac silhouette is within normal limits. No acute osseous pathology. IMPRESSION: No active disease. Electronically Signed   By: Vanetta Shelia HERO.D.  On: 10/10/2024 17:54        Scheduled Meds:  atenolol   50 mg Oral Daily   budesonide -glycopyrrolate -formoterol   2 puff Inhalation BID    clopidogrel   75 mg Oral Daily   doxycycline   100 mg Oral Q12H   DULoxetine   60 mg Oral q AM   ferrous sulfate   325 mg Oral Q breakfast   ipratropium-albuterol   3 mL Nebulization Q6H   isosorbide  mononitrate  30 mg Oral Daily   levothyroxine   50 mcg Oral QAC breakfast   methylPREDNISolone  (SOLU-MEDROL ) injection  40 mg Intravenous Q12H   Followed by   NOREEN ON 10/12/2024] predniSONE   40 mg Oral Q breakfast   pantoprazole   40 mg Oral QAC breakfast   polyethylene glycol  17 g Oral Daily   simvastatin   40 mg Oral QHS   sodium chloride  flush  3 mL Intravenous Q12H   sodium chloride   2 g Oral TID WC   Continuous Infusions:  sodium chloride        LOS: 1 day    Time spent: 52 minutes spent on 10/11/2024 caring for this patient face-to-face including chart review, ordering labs/tests, documenting, discussion with nursing staff, consultants, updating family and interview/physical exam    Camellia PARAS Pershing Skidmore, DO [facility] Available via Epic secure chat 7am-7pm After these hours, please refer to coverage provider listed on [email or web address] 10/11/2024, 12:01 PM   "

## 2024-10-11 NOTE — TOC Initial Note (Signed)
 Transition of Care [facility]) - Initial/Assessment Note   Patient Details  Name: Shannon Roberts MRN: [id number] Date of Birth: 12/26/1950  Transition of Care [facility]) CM/SW Contact:    Duwaine GORMAN Aran, LCSW Phone Number: 10/11/2024, 2:47 PM  Clinical Narrative: Patient is from home. Patient is active with [facility] for HHPT/RN and will need HH orders at discharge. Patient was set up with home oxygen through [employer] last admisison. OT evaluation recommended HH. Hospitalist placed HHPT/OT/RN orders. CSW notified [place] with [facility]. Care management to follow.  Expected Discharge Plan: Home w Home Health Services Barriers to Discharge: Continued Medical Work up  Patient Goals and CMS Choice CMS Medicare.gov Compare Post Acute Care list provided to:: Patient Choice offered to / list presented to : Patient [place] ownership interest in [facility].provided to:: Patient   Expected Discharge Plan and Services In-house Referral: Clinical Social Work Post Acute Care Choice: Resumption of Svcs/PTA Provider            DME Arranged: N/A DME Agency: NA  Prior Living Arrangements/Services Lives with:: Self Patient language and need for interpreter reviewed:: Yes Do you feel safe going back to the place where you live?: Yes      Need for Family Participation in Patient Care: Yes (Comment) Care giver support system in place?: Yes (comment) Current home services: DME (Rolling walker, cane, shower chair, nebulizer, home O2 thru [employer]) Criminal Activity/Legal Involvement Pertinent to Current Situation/Hospitalization: No - Comment as needed  Activities of Daily Living ADL Screening (condition at time of admission) Independently performs ADLs?: Yes (appropriate for developmental age) Is the patient deaf or have difficulty hearing?: No Does the patient have difficulty seeing, even when wearing glasses/contacts?: No Does the patient have difficulty concentrating, remembering, or making  decisions?: No  Emotional Assessment Alcohol / Substance Use: Not Applicable Psych Involvement: No (comment)  Admission diagnosis:  COPD exacerbation (HCC) [J44.1] Patient Active Problem List   Diagnosis Date Noted   COPD exacerbation (HCC) 10/10/2024   Leg edema, right 10/10/2024   CAD (coronary artery disease) 10/08/2024   Ambulatory dysfunction 10/07/2024   COVID-19 virus infection 10/06/2024   Class 1 obesity due to excess calories with body mass index (BMI) of 31.0 to 31.9 in adult 10/06/2024   Hyponatremia 10/03/2024   Essential hypertension 10/03/2024   Hypothyroidism 10/03/2024   Agatston coronary artery calcium  score greater than 400 03/14/2022   Elevated LFTs 07/27/2021   Pre-diabetes 08/03/2019   Abnormal urine 08/03/2019   Cyst of ovary 08/03/2019   Endometriosis of the cul-de-sac 08/03/2019   Female bladder prolapse 08/03/2019   Pelvic mass 08/03/2019   Pneumatouria 08/03/2019   Chronic kidney disease, stage 3a (HCC) 08/03/2019   Anemia 07/09/2019   Chronic constipation 07/09/2019   Chronic allergic rhinitis 07/30/2017   RBBB 01/31/2017   History of CVA (cerebrovascular accident) 01/31/2017   Anemia due to chronic kidney disease 01/31/2017   Occlusion and stenosis of carotid artery without mention of cerebral infarction 02/08/2014   Carotid stenosis 01/17/2014   Cerebral infarction (HCC) 01/03/2014   Stroke (HCC) 01/02/2014   Tobacco abuse 10/07/2011   HTN (hypertension), malignant 10/07/2011   Hyperlipidemia 10/07/2011   Anxiety disorder 10/07/2011   GERD (gastroesophageal reflux disease) 05/13/2011   Irritable bowel syndrome 05/13/2011   COPD, severe (HCC) 05/13/2011   PCP:  Okey Carlin Redbird, MD Pharmacy:   [id number] - [place], [place] - [address] AT [facility] [phone] [place] [address] Phone: [phone] Fax: [phone]  [id number] [place] [facility] - [address] [place] [address] Phone: [phone] Fax: [phone]  Social Drivers of Health (SDOH) Social History: SDOH Screenings   Food Insecurity: No Food Insecurity (10/11/2024)  Housing: Low Risk (10/11/2024)  Transportation Needs: No Transportation Needs (10/11/2024)  Utilities: Not At Risk (10/11/2024)  Social Connections: Socially Isolated (10/11/2024)  Tobacco Use: Medium Risk (10/10/2024)   SDOH Interventions:    Readmission Risk Interventions    10/11/2024    2:25 PM 10/07/2024    5:40 PM 10/05/2024    9:23 AM  Readmission Risk Prevention Plan  Transportation Screening Complete Complete Complete  PCP or Specialist Appt within 5-7 Days  Complete Complete  Home Care Screening  Complete Complete  Medication Review (RN CM)  Complete Complete  HRI or Home Care Consult Complete    Social Work Consult for Recovery Care Planning/Counseling Complete    Palliative Care Screening Not Applicable    Medication Review Oceanographer) Complete

## 2024-10-11 NOTE — Evaluation (Signed)
 Occupational Therapy Evaluation Patient Details Name: Shannon Roberts MRN: [id number] DOB: 28-Nov-1950 Today's Date: 10/11/2024   History of Present Illness   Pt is a 73 y/o female admitted for COPD exacerbation in setting of recent COVID-19 infection. PMH: COPD, hyponatremia, GERD, hypertension, HLD, anxiety, CVA, CKD, hypothyroidism, CAD     Clinical Impressions PTA, pt lives alone, typically completely independent with ADLs, IADLs, driving and mobility without AD. Pt endorses increased difficulty managing daily tasks at home given progressive SOB. Pt presents now with deficits in cardiopulmonary endurance and strength. Pt opted for [facility] transfers for toileting rather than mobilizing to bathroom d/t SOB. Overall, pt able to manage ADLs and transfers with Setup-Supervision assist. Seated rest breaks needed d/t dyspnea. Provided energy conservation handout and initiated education. Pt reports an interest in [facility] therapies upon DC to maximize independence in ADLs, IADLs and mobility at home.   SpO2 WFL on 3 L O2     If plan is discharge home, recommend the following:   Help with stairs or ramp for entrance;Assistance with cooking/housework     Functional Status Assessment   Patient has had a recent decline in their functional status and demonstrates the ability to make significant improvements in function in a reasonable and predictable amount of time.     Equipment Recommendations   None recommended by OT     Recommendations for Other Services         Precautions/Restrictions   Precautions Precautions: Fall Recall of Precautions/Restrictions: Intact Precaution/Restrictions Comments: monitor O2 (does not wear at baseline) Restrictions Weight Bearing Restrictions Per Provider Order: No     Mobility Bed Mobility Overal bed mobility: Modified Independent                  Transfers Overall transfer level: Needs assistance Equipment used: None Transfers: Sit  to/from Stand, Bed to chair/wheelchair/BSC Sit to Stand: Supervision     Step pivot transfers: Supervision     General transfer comment: to [facility] and then to recliner, no AD.      Balance Overall balance assessment: Needs assistance Sitting-balance support: Feet supported Sitting balance-Leahy Scale: Good     Standing balance support: During functional activity, Bilateral upper extremity supported, Reliant on assistive device for balance Standing balance-Leahy Scale: Fair                             ADL either performed or assessed with clinical judgement   ADL Overall ADL's : Needs assistance/impaired Eating/Feeding: Independent;Sitting   Grooming: Set up;Sitting   Upper Body Bathing: Set up;Sitting   Lower Body Bathing: Set up;Sitting/lateral leans;Sit to/from stand   Upper Body Dressing : Set up;Sitting   Lower Body Dressing: Set up;Sitting/lateral leans   Toilet Transfer: Set up;Supervision/safety;Stand-pivot;BSC/3in1 Toilet Transfer Details (indicate cue type and reason): Pt opting for BSC to avoid overexertion. able to pivot without assist to/from [facility] without AD Toileting- Clothing Manipulation and Hygiene: Set up;Sit to/from stand Toileting - Clothing Manipulation Details (indicate cue type and reason): able to manage hygiene and clothing without issues       General ADL Comments: Emphasis on energy conservation ideas for daily routine. also provided energy conservation handout for pt reference.     Vision Baseline Vision/History: 1 Wears glasses Ability to See in Adequate Light: 0 Adequate Patient Visual Report: No change from baseline Vision Assessment?: No apparent visual deficits     Perception  Praxis         Pertinent Vitals/Pain Pain Assessment Pain Assessment: No/denies pain     Extremity/Trunk Assessment Upper Extremity Assessment Upper Extremity Assessment: Overall WFL for tasks assessed;Right hand dominant   Lower  Extremity Assessment Lower Extremity Assessment: Defer to PT evaluation   Cervical / Trunk Assessment Cervical / Trunk Assessment: Normal   Communication Communication Communication: No apparent difficulties   Cognition Arousal: Alert Behavior During Therapy: WFL for tasks assessed/performed Cognition: No apparent impairments                               Following commands: Intact       Cueing  General Comments   Cueing Techniques: Verbal cues  SpO2 94% on 3 L O2. pt endorsing some SOB but recovering well with seated rest break   Exercises     Shoulder Instructions      Home Living Family/patient expects to be discharged to:: Private residence Living Arrangements: Alone Available Help at Discharge: Family;Available PRN/intermittently Type of Home: House Home Access: Stairs to enter Entrance Stairs-Number of Steps: 3 Entrance Stairs-Rails: Left Home Layout: One level     Bathroom Shower/Tub: Chief Strategy Officer: Standard     Home Equipment: Shower seat;Cane - single point;Rolling Environmental Consultant (2 wheels)          Prior Functioning/Environment Prior Level of Function : Independent/Modified Independent;Driving             Mobility Comments: pt reports ind with household and community amb; reports 1 fall which was Oct 2025 where she dislocated her shoulder requiring surgery and post-op PT ADLs Comments: She was independent with ADLs, cooking, cleaning and driving.    OT Problem List: Decreased strength;Impaired balance (sitting and/or standing);Decreased knowledge of use of DME or AE;Cardiopulmonary status limiting activity   OT Treatment/Interventions: Self-care/ADL training;Therapeutic exercise;Energy conservation;DME and/or AE instruction;Therapeutic activities;Balance training;Patient/family education      OT Goals(Current goals can be found in the care plan section)   Acute Rehab OT Goals Patient Stated Goal: improve  breathing, not need O2 anymore OT Goal Formulation: With patient Time For Goal Achievement: 10/25/24 Potential to Achieve Goals: Good ADL Goals Pt Will Perform Lower Body Bathing: with modified independence;sit to/from stand;sitting/lateral leans Pt Will Transfer to Toilet: with modified independence;ambulating Pt Will Perform Tub/Shower Transfer: Tub transfer;with set-up;ambulating;rolling walker Additional ADL Goal #1: Pt to verbalize at least 3 energy conservation strategies to implement during ADLs, IADLs and mobility Additional ADL Goal #2: Pt to demo ability to gather ADL/IADL items MOD I without LOB or safety concerns   OT Frequency:  Min 2X/week    Co-evaluation              AM-PAC OT 6 Clicks Daily Activity     Outcome Measure Help from another person eating meals?: None Help from another person taking care of personal grooming?: A Little Help from another person toileting, which includes using toliet, bedpan, or urinal?: A Little Help from another person bathing (including washing, rinsing, drying)?: A Little Help from another person to put on and taking off regular upper body clothing?: None Help from another person to put on and taking off regular lower body clothing?: A Little 6 Click Score: 20   End of Session Equipment Utilized During Treatment: Oxygen Nurse Communication: Mobility status;Other (comment) (pt inquiring about incentive spirometer)  Activity Tolerance: Patient tolerated treatment well Patient left: in chair;with call bell/phone  within reach;with chair alarm set  OT Visit Diagnosis: Muscle weakness (generalized) (M62.81);Other abnormalities of gait and mobility (R26.89)                Time: 1105-1130 OT Time Calculation (min): 25 min Charges:  OT General Charges $OT Visit: 1 Visit OT Evaluation $OT Eval Low Complexity: 1 Low OT Treatments $Self Care/Home Management : 8-22 mins  Mliss NOVAK, OTR/L Acute Rehab Services Office: [phone]    Mliss Fish 10/11/2024, 11:59 AM

## 2024-10-11 NOTE — Plan of Care (Signed)

## 2024-10-12 DIAGNOSIS — J441 Chronic obstructive pulmonary disease with (acute) exacerbation: Secondary | ICD-10-CM | POA: Diagnosis not present

## 2024-10-12 LAB — BASIC METABOLIC PANEL WITH GFR
Anion gap: 10 (ref 5–15)
BUN: 24 mg/dL — ABNORMAL HIGH (ref 8–23)
CO2: 25 mmol/L (ref 22–32)
Calcium: 10 mg/dL (ref 8.9–10.3)
Chloride: 104 mmol/L (ref 98–111)
Creatinine, Ser: 1.12 mg/dL — ABNORMAL HIGH (ref 0.44–1.00)
GFR, Estimated: 51 mL/min — ABNORMAL LOW
Glucose, Bld: 180 mg/dL — ABNORMAL HIGH (ref 70–99)
Potassium: 5 mmol/L (ref 3.5–5.1)
Sodium: 138 mmol/L (ref 135–145)

## 2024-10-12 MED ORDER — IPRATROPIUM-ALBUTEROL 0.5-2.5 (3) MG/3ML IN SOLN
3.0000 mL | Freq: Three times a day (TID) | RESPIRATORY_TRACT | Status: DC
  Administered 2024-10-12 – 2024-10-23 (×31): 3 mL via RESPIRATORY_TRACT
  Filled 2024-10-12 (×16): qty 3

## 2024-10-12 MED ORDER — ADULT MULTIVITAMIN W/MINERALS CH
1.0000 | ORAL_TABLET | Freq: Every day | ORAL | Status: DC
  Administered 2024-10-13 – 2024-10-23 (×11): 1 via ORAL
  Filled 2024-10-12 (×5): qty 1

## 2024-10-12 MED ORDER — ENSURE PLUS HIGH PROTEIN PO LIQD
237.0000 mL | Freq: Two times a day (BID) | ORAL | Status: DC
  Administered 2024-10-13 – 2024-10-16 (×7): 237 mL via ORAL

## 2024-10-12 MED ORDER — ENOXAPARIN SODIUM 40 MG/0.4ML IJ SOSY
40.0000 mg | PREFILLED_SYRINGE | INTRAMUSCULAR | Status: DC
  Administered 2024-10-12 – 2024-10-22 (×10): 40 mg via SUBCUTANEOUS
  Filled 2024-10-12 (×5): qty 0.4

## 2024-10-12 NOTE — Discharge Instructions (Signed)
 Chronic Obstructive Pulmonary Disease (COPD) Nutrition Therapy  Symptoms of COPD such as cough, shortness of breath, and fatigue can make it more difficult for you to eat enough. It is important that you conserve your energy and make sure you have enough calories and protein in your diet. This nutrition therapy for COPD may help you improve your food choices and meal patterns to help meet your nutrition goals.  Tips Mealtime strategies Eat when you are hungry. Experiment with timing meals to find out when you have a larger appetite. Eat small meals and snacks 5-6 times per day. Try to eat even when you are not feeling hungry. Reduce activity around mealtimes to preserve energy levels. Eat slowly. Select foods that are easy to chew and swallow and be sure to chew your foods well. Limit carbonated beverages like soda and seltzer to help reduce symptoms of bloating and fullness. Let others (family, friends, neighbors) help, including with shopping, preparation, and clean up. Use supplemental oxygen around mealtimes if prescribed. Use good posture while eating to make it easier to eat and breathe. Choose Foods and Beverages High in Nutrients and Calories Replace light or diet foods and beverages and low-calorie beverages and broths with nutrient-dense choices. These light and diet foods and liquids dont provide much benefit. Choose a variety of easy-to-prepare and easy-to-eat fruits, vegetables, and whole grains to meet your vitamin, mineral, and fiber needs. Drink beverages high in calories and nutrients such as full fat dairy products and oral nutrition supplements. Choose a variety of high-calorie and high-protein foods that you enjoy such as nuts, soy nuts, nut butter, seed butter, cheese, fish, poultry, tofu, and tempeh and prepare with oil or sauces. Take medical food supplements as recommended. Stay Hydrated Keep a water  bottle with you at all times to help you meet your fluid goals. Drink  fluids, including water , throughout the day and evening.    Foods to Choose Food Group Foods to Choose  Grains Breads, tortillas, crackers, pasta Rice, quinoa, barley, corn Breakfast cereals such as cold cereals, oatmeal, and grits  Protein Foods Meats--beef or pork Chicken and turkey - with or without skin, light meat or dark meat Fish, especially fatty fish like tuna, mackerel, and salmon Nuts, nut butters Eggs Beans, peas, and lentils Soy products such as tofu  Dairy and Dairy Alternatives Milkshakes, whole milk, fortified milk (powdered nonfat milk added to fluid milk) Fortified non-dairy milk (almond, rice, soy, etc.) Cheese Full-fat yogurt or Greek yogurt Cream cheese Sour cream  Vegetables All fresh, frozen, or canned vegetables.  Fruit All fresh, frozen, or canned fruit.  Fats and Oils Vegetable oils (canola, olive, etc.) Butter or margarine, Salad dressings   Chronic Obstructive Pulmonary Disease (COPD) Sample 1-Day Menu View Nutrient Info Breakfast 2 egg omelet made with:  cup bell pepper, chopped 1 tablespoon onion, chopped  cup tomato, chopped 2 slices whole wheat toast 1 tablespoon butter  cup orange juice fortified with calcium   Lunch 3 ounces tuna, canned 2 tablespoons mayonnaise 7 whole wheat crackers  cup canned pear halves  Afternoon Snack Smoothie made with: 1 banana  cup frozen strawberries 2 tablespoons peanut butter  cup whole milk yogurt  cup whole milk  Evening Meal 3 ounces ground beef patty 1 whole wheat hamburger bun 1 tablespoon ketchup  cup broccoli, cooked 1 baked potato 2 tablespoons sour cream 1 tablespoon cheddar cheese  Evening Snack  cup ice cream  cup peach halves  Daily Sum Nutrient Unit Value  Macronutrients  Energy kcal 2468  Energy kJ 10328  Protein g 99  Total lipid (fat) g 110  Carbohydrate, by difference g 284  Fiber, total dietary g 30  Sugars, total g 140  Minerals  Calcium , Ca mg 1740  Iron, Fe mg  15  Sodium, Na mg 1766  Vitamins  Vitamin C, total ascorbic acid mg 351  Vitamin A, IU IU 4638  Vitamin D IU 198  Lipids  Fatty acids, total saturated g 38  Fatty acids, total monounsaturated g 35  Fatty acids, total polyunsaturated g 26  Cholesterol mg 546     Chronic Obstructive Pulmonary Disease (COPD) Vegan Sample 1-Day Menu View Nutrient Info Breakfast 1/3 cup tofu scramble 1 tablespoon margarine, soft, tub 2 slices whole wheat toast  cup orange juice  Lunch  cup hummus 8 whole wheat crackers 2 peach halves, canned 4 walnut halves 1 cup soymilk fortified with calcium , vitamin B12, and vitamin D  Afternoon Snack Smoothie made with: 1 banana  cup frozen strawberries 1 scoop soy protein powder  cup apple juice  Evening Meal 1 cup meatless meatballs  cup tomato sauce 1 baked potato 1 tablespoon margarine, soft, tub  cup broccoli 1 whole wheat dinner roll  Evening Snack 6 ounces soy yogurt  Daily Sum Nutrient Unit Value  Macronutrients  Energy kcal 2151  Energy kJ 9003  Protein g 106  Total lipid (fat) g 70  Carbohydrate, by difference g 292  Fiber, total dietary g 42  Sugars, total g 120  Minerals  Calcium , Ca mg 1307  Iron, Fe mg 21  Sodium, Na mg 2530  Vitamins  Vitamin C, total ascorbic acid mg 260  Vitamin A, IU IU 3976  Vitamin D IU 194  Lipids  Fatty acids, total saturated g 12  Fatty acids, total monounsaturated g 21  Fatty acids, total polyunsaturated g 31  Cholesterol mg 0     Chronic Obstructive Pulmonary Disease (COPD) Vegetarian (Lacto-Ovo) Sample 1-Day Menu View Nutrient Info Breakfast 2 scrambled eggs 1 tablespoon butter 2 slices whole wheat toast  cup orange juice  Lunch  cup hummus 8 whole wheat crackers 2 tablespoons cottage cheese 2 peach halves, canned 4 walnut halves 1 cup whole milk  Afternoon Snack Smoothie made with: 1 banana  cup frozen strawberries  cup fat-free dry milk  cup apple juice  Evening Meal 1  veggie burger 1 whole wheat hamburger bun 1 tablespoon mayonnaise 1 baked potato 1 tablespoon butter  cup broccoli 1 tablespoon shredded cheese  Evening Snack 6 ounces yogurt  Daily Sum Nutrient Unit Value  Macronutrients  Energy kcal 2300  Energy kJ 9619  Protein g 90  Total lipid (fat) g 88  Carbohydrate, by difference g 304  Fiber, total dietary g 37  Sugars, total g 139  Minerals  Calcium , Ca mg 1738  Iron, Fe mg 15  Sodium, Na mg 2431  Vitamins  Vitamin C, total ascorbic acid mg 233  Vitamin A, IU IU 4577  Vitamin D IU 466  Lipids  Fatty acids, total saturated g 31  Fatty acids, total monounsaturated g 25  Fatty acids, total polyunsaturated g 24  Cholesterol mg 282     Pulmonary Disease Sample 1-Day Menu View Nutrient Info Breakfast 2 scrambled eggs 1 tablespoon butter 2 slices whole wheat toast 6 oz orange juice  Lunch 1/2 cup tuna salad 2 canned peach halves 2 tablespoons cottage cheese 4 walnut halves  Afternoon Snack 1/2 cup apple juice, for smoothie 1  banana, for smoothie 1/2 cup frozen strawberries, for smoothie 1/4 cup fat-free dry milk, for smoothie  Evening Meal 3 oz ground beef patty 1/4 cup gravy 1 baked potato 1 tablespoon butter 1/2 cup broccoli 1 tablespoon melted cheese, for broccoli 2 slices whole wheat bread  Evening Snack 1/2 cup ice cream  Daily Sum Nutrient Unit Value  Macronutrients  Energy kcal 2644  Energy kJ 11060  Protein g 136  Total lipid (fat) g 109  Carbohydrate, by difference g 287  Fiber, total dietary g 23  Sugars, total g 99  Minerals  Calcium , Ca mg 1533  Iron, Fe mg 13  Sodium, Na mg 4912  Vitamins  Vitamin C, total ascorbic acid mg 189  Vitamin A, IU IU 5134  Vitamin D IU 191  Lipids  Fatty acids, total saturated g 50  Fatty acids, total monounsaturated g 34  Fatty acids, total polyunsaturated g 16  Cholesterol mg 637    Copyright 2020  Academy of Nutrition and Dietetics. All rights reserved

## 2024-10-12 NOTE — Progress Notes (Addendum)
 Initial Nutrition Assessment  DOCUMENTATION CODES:   Obesity unspecified  INTERVENTION:   -Liberalize diet to regular for wider variety of meal selections -MVI with minerals daily -Ensure Plus High Protein po BID, each supplement provides 350 kcal and 20 grams of protein  -RD provided COPD Nutrition Therapy handout from [facility] Nutrition Care Manual; attached to AVS/ discharge summary   NUTRITION DIAGNOSIS:   Increased nutrient needs related to chronic illness (COPD) as evidenced by estimated needs.  GOAL:   Patient will meet greater than or equal to 90% of their needs  MONITOR:   PO intake, Supplement acceptance  REASON FOR ASSESSMENT:   Consult Assessment of nutrition requirement/status  ASSESSMENT:   73 y.o. female with past medical history significant for HTN, HLD, CKD stage IIIa, hypothyroidism, COPD, SIADH, obesity, anxiety, recent COVID-19 viral infection who presented  with complaints of progressive shortness of breath, worse with exertion  Patient admitted with COPD exacerbation.   Reviewed I/O's: +723 ml x 24 hours and +789 ml since admission  Patient unavailable at time of visit. Attempted to speak with patient via call to hospital room phone, however, unable to reach. RD unable to obtain further nutrition-related history or complete nutrition-focused physical exam at this time.    Per H&P, patient was recently admitted with hyponatremia secondary to SIADH; she was discharged on salt tablets, fluid restriction, and 2 L oxygen.   Patient currently on a heart healthy diet. Noted meal completions 5-75%. Case discussed with RN; she is unsure how much patient ate for lunch, but did request graham crackers earlier.   Reviewed weight history. Noted patient has experienced a 6% weight loss over the past 2 months, which is not significant for time frame. Patient also with mild edema per nursing assessment which may be masking true weight loss as well as fat and muscle  depletions.   Medications reviewed and include prednisone , miralax , and sodium chloride .  Per TOC notes, patient is active with home health services via [facility].    Obesity is a complex, chronic medical condition that is optimally managed by a multidisciplinary care team. Weight loss is not an ideal goal for an acute inpatient hospitalization. However, if further work-up for obesity is warranted, consider outpatient referral to [facility].    Labs reviewed.    Diet Order:   Diet Order             Diet Heart Room service appropriate? Yes; Fluid consistency: Thin; Fluid restriction: 1200 mL Fluid  Diet effective now                   EDUCATION NEEDS:   Education needs have been addressed  Skin:  Skin Assessment: Reviewed RN Assessment  Last BM:  10/10/24  Height:   Ht Readings from Last 1 Encounters:  10/10/24 5' 5 (1.651 m)    Weight:   Wt Readings from Last 1 Encounters:  10/10/24 82.7 kg    Ideal Body Weight:  56.8 kg  BMI:  Body mass index is 30.34 kg/m.  Estimated Nutritional Needs:   Kcal:  1700-1900  Protein:  90-105 grams  Fluid:  1.7-1.9 L    Margery ORN, RD, LDN, CDCES Registered Dietitian III Certified Diabetes Care and Education Specialist If unable to reach this RD, please use RD Inpatient group chat on secure chat between hours of 8am-4 pm daily

## 2024-10-12 NOTE — Progress Notes (Signed)
 " PROGRESS NOTE    Shannon Roberts  FMW:[id number] DOB: 1951-03-29 DOA: 10/10/2024 PCP: Shannon Carlin Redbird, MD    Brief Narrative:   Shannon Roberts is a 73 y.o. female with past medical history significant for HTN, HLD, CKD stage IIIa, hypothyroidism, COPD, SIADH, obesity, anxiety, recent COVID-19 viral infection who presented to [facility] ED on 10/10/2024 via EMS from home with complaints of progressive shortness of breath, worse with exertion.  Denies any sick contacts, no fevers, no chills, no chest pain.  Patient recently admitted for hyponatremia secondary to SIADH in which she was found to have a sodium of 119, patient was started on salt tablets and fluid restriction with improvement of sodium to 130 at time of discharge.  Additionally, patient was also discharged on 2 L nasal cannula related to her COPD and recent COVID infection.  In the ED, temperature 97.9 F, HR 83, RR 19, BP 147/72, SpO2 96% on 2L [place].  WBC 6.2, hemoglobin 10.5, plate count 838.  Sodium 143, potassium 2.6, chloride 120, CO2 14, glucose 83, BUN 14, creatinine 0.58.  Calcium  6.1.  Anion gap 9.  COVID/influenza/RSV PCR negative.  Chest x-ray with no active cardiopulmonary disease process.  Patient was started on IV Solu-Medrol , given IV magnesium  DuoNebs, potassium supplements and albuterol  neb.  TRH consulted for admission for further evaluation and management of COPD exacerbation, concern for community-acquired pneumonia.  Assessment & Plan:   Acute hypoxic respiratory failure, POA COPD exacerbation Patient presenting with progressive shortness of breath, not oxygen dependent at baseline although was recently discharged on home oxygen from recent Covid-19 viral infection.  Has underlying COPD, follows with pulmonology Dr. Kara outpatient.  Patient is afebrile without leukocytosis.  Chest x-ray with no focal consolidation. -- Doxycycline  100 mg p.o. twice daily -- Breztri  2 puffs twice daily (substituted  for home Trelegy Ellipta ) -- Solu-Medrol  40 mg IV q12h x 1 day; followed by prednisone  40 mg p.o. daily -- DuoNeb every 6 hours scheduled -- Albuterol  neb every 2 hours.  Wheezing/shortness of breath -- Continue supplemental oxygen to maintain SpO2 greater than 88%; O2 walk screen today  HTN CAD HLD -- Atenolol  50 g p.o. daily -- Isosorbide  mononitrate 30 m p.o. daily -- Plavix  75 mg p.o. daily -- Simvastatin  40 mg p.o. daily  CKD stage IIIa -- Cr 1.10>1.12; stable  Hypothyroidism -- Levothyroxine  50 mcg p.o. daily  SIADH -- Na 864>863>861 -- Sodium chloride  2 g p.o. 3 times daily AC -- 1200 mL fluid restriction  GERD -- Protonix  40 mg p.o. daily  Anxiety -- Lorazepam  0.5 - 0.75 mg p.o. every 6 hours as needed anxiety  Obesity, class I Body mass index is 30.34 kg/m.     DVT prophylaxis: enoxaparin  (LOVENOX ) injection 40 mg Start: 10/12/24 1200 SCDs Start: 10/11/24 0343    Code Status: Full Code Family Communication: No family present at bedside this morning  Disposition Plan:  Level of care: Progressive Status is: Inpatient Remains inpatient appropriate because: Scheduled neb treatments, weaning from supplemental oxygen    Consultants:  None  Procedures:  Vas duplex ultrasound right lower extremity: Negative for DVT  Antimicrobials:  Doxycycline  12/22   Subjective: Patient seen examined bedside, lying in bed.  Remains on 2 L nasal cannula, with SpO2 97% at rest.  Dyspnea continues to slowly improve daily.  Remains on antibiotics, scheduled neb treatments, steroids.  Will attempt O2 walk screen today.  No other questions or concerns at this time.  Denies headache,  no dizziness, no chest pain, no palpitations, no fever/chills/night sweats, no nausea/vomiting/diarrhea, no focal weakness, no fatigue, no paresthesias.  No acute events overnight per nurse staff.  Objective: Vitals:   10/11/24 1734 10/11/24 1956 10/12/24 0417 10/12/24 0751  BP:  130/64 (!)  128/91   Pulse:  84 78   Resp:  20 17   Temp:  98 F (36.7 C) 97.9 F (36.6 C)   TempSrc:  Oral Oral   SpO2: 96% 93% 94% 97%  Weight:      Height:        Intake/Output Summary (Last 24 hours) at 10/12/2024 1342 Last data filed at 10/12/2024 1212 Gross per 24 hour  Intake 720 ml  Output --  Net 720 ml   Filed Weights   10/10/24 1736  Weight: 82.7 kg    Examination:  Physical Exam: GEN: NAD, alert and oriented x 3, obese HEENT: NCAT, PERRL, EOMI, sclera clear, MMM PULM: Breath sounds diminished bilateral bases without wheezes/crackles, normal respiratory effort without accessory muscle use, on 2 L nasal cannula SpO2 97% at rest CV: RRR w/o M/G/R GI: abd soft, NTND, + BS MSK: Trace bilateral lower extremity peripheral edema, moves all extremities independently with preserved muscle strength NEURO: No focal neurological deficit PSYCH: normal mood/affect Integumentary: No concerning rashes/lesions/wounds noted on exposed skin surfaces    Data Reviewed: I have personally reviewed following labs and imaging studies  CBC: Recent Labs  Lab 10/10/24 1838 10/10/24 2021 10/11/24 0658  WBC 6.2  --  6.2  NEUTROABS 4.5  --   --   HGB 10.5* 10.5* 10.4*  HCT 32.0* 31.0* 30.9*  MCV 95.2  --  92.8  PLT 161  --  257   Basic Metabolic Panel: Recent Labs  Lab 10/07/24 0550 10/07/24 1041 10/10/24 1838 10/10/24 2021 10/11/24 0020 10/11/24 0658 10/12/24 0410  NA 130*   < > 143 136 135 136 138  K 4.7  --  2.6* 4.0 4.4 4.7 5.0  CL 98  --  120* 101 100 100 104  CO2 20*  --  14*  --  22 23 25   GLUCOSE 125*  --  83 121* 164* 154* 180*  BUN 18  --  14 22 21 21  24*  CREATININE 1.29*  --  0.58 1.10* 1.10* 1.03* 1.12*  CALCIUM  9.8  --  6.1*  --  10.1 10.2 10.0  MG  --   --   --   --  2.4 2.3  --   PHOS  --   --   --   --   --  4.0  --    < > = values in this interval not displayed.   GFR: Estimated Creatinine Clearance: 46.8 mL/min (A) (by C-G formula based on SCr of 1.12  mg/dL (H)). Liver Function Tests: Recent Labs  Lab 10/11/24 0020 10/11/24 0658  AST 23 20  ALT 28 24  ALKPHOS 61 59  BILITOT 0.5 0.4  PROT 7.0 6.7  ALBUMIN  3.9 3.8   No results for input(s): LIPASE, AMYLASE in the last 168 hours. No results for input(s): AMMONIA in the last 168 hours. Coagulation Profile: No results for input(s): INR, PROTIME in the last 168 hours. Cardiac Enzymes: No results for input(s): CKTOTAL, CKMB, CKMBINDEX, TROPONINI in the last 168 hours. BNP (last 3 results) Recent Labs    10/03/24 1444  PROBNP 532.0*   HbA1C: No results for input(s): HGBA1C in the last 72 hours. CBG: No results for input(s): GLUCAP  in the last 168 hours. Lipid Profile: No results for input(s): CHOL, HDL, LDLCALC, TRIG, CHOLHDL, LDLDIRECT in the last 72 hours. Thyroid  Function Tests: No results for input(s): TSH, T4TOTAL, FREET4, T3FREE, THYROIDAB in the last 72 hours. Anemia Panel: No results for input(s): VITAMINB12, FOLATE, FERRITIN, TIBC, IRON, RETICCTPCT in the last 72 hours. Sepsis Labs: No results for input(s): PROCALCITON, LATICACIDVEN in the last 168 hours.  Recent Results (from the past 240 hours)  MRSA Next Gen by PCR, Nasal     Status: None   Collection Time: 10/03/24  9:45 PM   Specimen: Nasal Mucosa; Nasal Swab  Result Value Ref Range Status   MRSA by PCR Next Gen NOT DETECTED NOT DETECTED Final    Comment: (NOTE) The GeneXpert MRSA Assay (FDA approved for NASAL specimens only), is one component of a comprehensive MRSA colonization surveillance program. It is not intended to diagnose MRSA infection nor to guide or monitor treatment for MRSA infections. Test performance is not FDA approved in patients less than 68 years old. Performed at [facility], [address] [place], [place] [address]   Resp panel by RT-PCR (RSV, Flu A&B, Covid) Anterior Nasal Swab     Status: None    Collection Time: 10/10/24  6:35 PM   Specimen: Anterior Nasal Swab  Result Value Ref Range Status   SARS Coronavirus 2 by RT PCR NEGATIVE NEGATIVE Final    Comment: (NOTE) SARS-CoV-2 target nucleic acids are NOT DETECTED.  The SARS-CoV-2 RNA is generally detectable in upper respiratory specimens during the acute phase of infection. The lowest concentration of SARS-CoV-2 viral copies this assay can detect is 138 copies/mL. A negative result does not preclude SARS-Cov-2 infection and should not be used as the sole basis for treatment or other patient management decisions. A negative result may occur with  improper specimen collection/handling, submission of specimen other than nasopharyngeal swab, presence of viral mutation(s) within the areas targeted by this assay, and inadequate number of viral copies(<138 copies/mL). A negative result must be combined with clinical observations, patient history, and epidemiological information. The expected result is Negative.  Fact Sheet for Patients:  bloggercourse.com  Fact Sheet for Healthcare Providers:  seriousbroker.it  This test is no t yet approved or cleared by the [place]  FDA and  has been authorized for detection and/or diagnosis of SARS-CoV-2 by FDA under an Emergency Use Authorization (EUA). This EUA will remain  in effect (meaning this test can be used) for the duration of the COVID-19 declaration under Section 564(b)(1) of the Act, 21 U.S.C.section 360bbb-3(b)(1), unless the authorization is terminated  or revoked sooner.       Influenza A by PCR NEGATIVE NEGATIVE Final   Influenza B by PCR NEGATIVE NEGATIVE Final    Comment: (NOTE) The Xpert Xpress SARS-CoV-2/FLU/RSV plus assay is intended as an aid in the diagnosis of influenza from Nasopharyngeal swab specimens and should not be used as a sole basis for treatment. Nasal washings and aspirates are unacceptable for  Xpert Xpress SARS-CoV-2/FLU/RSV testing.  Fact Sheet for Patients: bloggercourse.com  Fact Sheet for Healthcare Providers: seriousbroker.it  This test is not yet approved or cleared by the [place]  FDA and has been authorized for detection and/or diagnosis of SARS-CoV-2 by FDA under an Emergency Use Authorization (EUA). This EUA will remain in effect (meaning this test can be used) for the duration of the COVID-19 declaration under Section 564(b)(1) of the Act, 21 U.S.C. section 360bbb-3(b)(1), unless the authorization is terminated or  revoked.     Resp Syncytial Virus by PCR NEGATIVE NEGATIVE Final    Comment: (NOTE) Fact Sheet for Patients: bloggercourse.com  Fact Sheet for Healthcare Providers: seriousbroker.it  This test is not yet approved or cleared by the [place]  FDA and has been authorized for detection and/or diagnosis of SARS-CoV-2 by FDA under an Emergency Use Authorization (EUA). This EUA will remain in effect (meaning this test can be used) for the duration of the COVID-19 declaration under Section 564(b)(1) of the Act, 21 U.S.C. section 360bbb-3(b)(1), unless the authorization is terminated or revoked.  Performed at [facility], [address], [place], [place] [address]          Radiology Studies: VAS US  LOWER EXTREMITY VENOUS (DVT) Result Date: 10/11/2024  Lower Venous DVT Study Patient Name:  SIMRIN VEGH  Date of Exam:   10/11/2024 Medical Rec #: [id number]         Accession #:    [id number] Date of Birth: 1951/09/12         Patient Gender: F Patient Age:   23 years Exam Location:  [facility] Procedure:      VAS US  LOWER EXTREMITY VENOUS (DVT) Referring Phys: BLEASE DOUTOVA --------------------------------------------------------------------------------  Indications: Swelling, and Recent Covid infection.   Comparison Study: Prior negative bilateral LEV done 01/31/17 Performing Technologist: Alberta Lis RVS  Examination Guidelines: A complete evaluation includes B-mode imaging, spectral Doppler, color Doppler, and power Doppler as needed of all accessible portions of each vessel. Bilateral testing is considered an integral part of a complete examination. Limited examinations for reoccurring indications may be performed as noted. The reflux portion of the exam is performed with the patient in reverse Trendelenburg.  +---------+---------------+---------+-----------+----------+--------------+ RIGHT    CompressibilityPhasicitySpontaneityPropertiesThrombus Aging +---------+---------------+---------+-----------+----------+--------------+ CFV      Full           Yes      Yes                                 +---------+---------------+---------+-----------+----------+--------------+ SFJ      Full                                                        +---------+---------------+---------+-----------+----------+--------------+ FV Prox  Full           Yes      Yes                                 +---------+---------------+---------+-----------+----------+--------------+ FV Mid   Full           Yes      Yes                                 +---------+---------------+---------+-----------+----------+--------------+ FV DistalFull                                                        +---------+---------------+---------+-----------+----------+--------------+ PFV      Full                                                        +---------+---------------+---------+-----------+----------+--------------+  POP      Full           Yes      Yes                                 +---------+---------------+---------+-----------+----------+--------------+ PTV      Full                                                         +---------+---------------+---------+-----------+----------+--------------+ PERO     Full                                                        +---------+---------------+---------+-----------+----------+--------------+ Gastroc  Full                                                        +---------+---------------+---------+-----------+----------+--------------+ SSV      Full                                                        +---------+---------------+---------+-----------+----------+--------------+   +----+---------------+---------+-----------+----------+--------------+ LEFTCompressibilityPhasicitySpontaneityPropertiesThrombus Aging +----+---------------+---------+-----------+----------+--------------+ CFV Full           Yes      Yes                                 +----+---------------+---------+-----------+----------+--------------+ SFJ Full                                                        +----+---------------+---------+-----------+----------+--------------+     Summary: RIGHT: - There is no evidence of deep vein thrombosis in the lower extremity.  - No cystic structure found in the popliteal fossa.  LEFT: - No evidence of common femoral vein obstruction.   *See table(s) above for measurements and observations. Electronically signed by Lonni Gaskins MD on 10/11/2024 at 2:56:58 PM.    Final    DG Chest Port 1 View Result Date: 10/10/2024 CLINICAL DATA:  Shortness of breath. EXAM: PORTABLE CHEST 1 VIEW COMPARISON:  Chest radiograph dated 10/08/2024. FINDINGS: Background of emphysema. No focal consolidation, pleural effusion, or pneumothorax. The cardiac silhouette is within normal limits. No acute osseous pathology. IMPRESSION: No active disease. Electronically Signed   By: Vanetta Chou M.D.   On: 10/10/2024 17:54        Scheduled Meds:  amLODipine   5 mg Oral Daily   atenolol   50 mg Oral Daily   budesonide -glycopyrrolate -formoterol   2 puff  Inhalation BID   clopidogrel   75 mg Oral Daily   doxycycline   100 mg Oral  Q12H   DULoxetine   60 mg Oral q AM   enoxaparin  (LOVENOX ) injection  40 mg Subcutaneous Q24H   feeding supplement  237 mL Oral BID BM   ferrous sulfate   325 mg Oral Q breakfast   ipratropium-albuterol   3 mL Nebulization Q6H   isosorbide  mononitrate  30 mg Oral BID   levothyroxine   50 mcg Oral QAC breakfast   multivitamin with minerals  1 tablet Oral Daily   pantoprazole   40 mg Oral QAC breakfast   polyethylene glycol  17 g Oral Daily   predniSONE   40 mg Oral Q breakfast   simvastatin   40 mg Oral QHS   sodium chloride  flush  3 mL Intravenous Q12H   sodium chloride   2 g Oral TID WC   Continuous Infusions:     LOS: 2 days    Time spent: 46 minutes spent on 10/12/2024 caring for this patient face-to-face including chart review, ordering labs/tests, documenting, discussion with nursing staff, consultants, updating family and interview/physical exam    Camellia PARAS Elliemae Braman, DO [facility] Available via Epic secure chat 7am-7pm After these hours, please refer to coverage provider listed on [email or web address] 10/12/2024, 1:42 PM   "

## 2024-10-12 NOTE — Progress Notes (Signed)
 NGT clamped pt aware she is being transfused today

## 2024-10-13 DIAGNOSIS — J441 Chronic obstructive pulmonary disease with (acute) exacerbation: Secondary | ICD-10-CM | POA: Diagnosis not present

## 2024-10-13 MED ORDER — BISACODYL 10 MG RE SUPP
10.0000 mg | Freq: Every day | RECTAL | Status: DC | PRN
  Administered 2024-10-13 – 2024-10-14 (×2): 10 mg via RECTAL
  Filled 2024-10-13 (×2): qty 1

## 2024-10-13 MED ORDER — SENNOSIDES-DOCUSATE SODIUM 8.6-50 MG PO TABS
1.0000 | ORAL_TABLET | Freq: Two times a day (BID) | ORAL | Status: DC
  Administered 2024-10-13 – 2024-10-21 (×11): 1 via ORAL
  Filled 2024-10-13 (×10): qty 1

## 2024-10-13 NOTE — Plan of Care (Signed)

## 2024-10-13 NOTE — Plan of Care (Signed)
" °  Problem: Education: Goal: Knowledge of General Education information will improve Description: Including pain rating scale, medication(s)/side effects and non-pharmacologic comfort measures 10/13/2024 0908 by Addie Ronal NOVAK, RN Outcome: Progressing 10/13/2024 0906 by Addie Ronal NOVAK, RN Outcome: Progressing   Problem: Health Behavior/Discharge Planning: Goal: Ability to manage health-related needs will improve 10/13/2024 0908 by Addie Ronal NOVAK, RN Outcome: Progressing 10/13/2024 0906 by Addie Ronal NOVAK, RN Outcome: Progressing   Problem: Clinical Measurements: Goal: Ability to maintain clinical measurements within normal limits will improve 10/13/2024 0908 by Addie Ronal NOVAK, RN Outcome: Progressing 10/13/2024 0906 by Addie Ronal NOVAK, RN Outcome: Progressing Goal: Will remain free from infection 10/13/2024 0908 by Addie Ronal NOVAK, RN Outcome: Progressing 10/13/2024 0906 by Addie Ronal NOVAK, RN Outcome: Progressing Goal: Diagnostic test results will improve 10/13/2024 0908 by Addie Ronal NOVAK, RN Outcome: Progressing 10/13/2024 0906 by Addie Ronal NOVAK, RN Outcome: Progressing Goal: Respiratory complications will improve 10/13/2024 0908 by Addie Ronal NOVAK, RN Outcome: Progressing 10/13/2024 0906 by Addie Ronal NOVAK, RN Outcome: Progressing Goal: Cardiovascular complication will be avoided 10/13/2024 0908 by Addie Ronal NOVAK, RN Outcome: Progressing 10/13/2024 0906 by Addie Ronal NOVAK, RN Outcome: Progressing   "

## 2024-10-13 NOTE — Progress Notes (Signed)
 " PROGRESS NOTE    Shannon Roberts  FMW:[id number] DOB: 31-Mar-1951 DOA: 10/10/2024 PCP: Okey Carlin Redbird, MD    Brief Narrative:   Shannon Roberts is a 73 y.o. female with past medical history significant for HTN, HLD, CKD stage IIIa, hypothyroidism, COPD, SIADH, obesity, anxiety, recent COVID-19 viral infection who presented to [facility] ED on 10/10/2024 via EMS from home with complaints of progressive shortness of breath, worse with exertion.  Denies any sick contacts, no fevers, no chills, no chest pain.  Patient recently admitted for hyponatremia secondary to SIADH in which she was found to have a sodium of 119, patient was started on salt tablets and fluid restriction with improvement of sodium to 130 at time of discharge.  Additionally, patient was also discharged on 2 L nasal cannula related to her COPD and recent COVID infection.  In the ED, temperature 97.9 F, HR 83, RR 19, BP 147/72, SpO2 96% on 2L [place].  WBC 6.2, hemoglobin 10.5, plate count 838.  Sodium 143, potassium 2.6, chloride 120, CO2 14, glucose 83, BUN 14, creatinine 0.58.  Calcium  6.1.  Anion gap 9.  COVID/influenza/RSV PCR negative.  Chest x-ray with no active cardiopulmonary disease process.  Patient was started on IV Solu-Medrol , given IV magnesium  DuoNebs, potassium supplements and albuterol  neb.  TRH consulted for admission for further evaluation and management of COPD exacerbation, concern for community-acquired pneumonia.  Assessment & Plan:   Acute hypoxic respiratory failure, POA COPD exacerbation Patient presenting with progressive shortness of breath, not oxygen dependent at baseline although was recently discharged on home oxygen from recent Covid-19 viral infection.  Has underlying COPD, follows with pulmonology Dr. Kara outpatient.  Patient is afebrile without leukocytosis.  Chest x-ray with no focal consolidation. -- Doxycycline  100 mg p.o. twice daily -- Breztri  2 puffs twice daily (substituted  for home Trelegy Ellipta ) -- Prednisone  40 mg p.o. daily -- DuoNeb every 6 hours scheduled -- Albuterol  neb every 2 hours.  Wheezing/shortness of breath -- Continue supplemental oxygen to maintain SpO2 greater than 88%; O2 walk screen today  HTN CAD HLD -- Atenolol  50 g p.o. daily -- Isosorbide  mononitrate 30 m p.o. daily -- Plavix  75 mg p.o. daily -- Simvastatin  40 mg p.o. daily  CKD stage IIIa -- Cr 1.10>1.12; stable  Hypothyroidism -- Levothyroxine  50 mcg p.o. daily  SIADH -- Na 864>863>861 -- Sodium chloride  2 g p.o. 3 times daily AC -- 1200 mL fluid restriction  GERD -- Protonix  40 mg p.o. daily  Anxiety -- Lorazepam  0.5 - 0.75 mg p.o. every 6 hours as needed anxiety  Obesity, class I Body mass index is 30.34 kg/m.   Weakness/debility/deconditioning: --  PT now recommending SNF placement -- TOC consultation   DVT prophylaxis: enoxaparin  (LOVENOX ) injection 40 mg Start: 10/12/24 1200 SCDs Start: 10/11/24 0343    Code Status: Full Code Family Communication: No family present at bedside this morning  Disposition Plan:  Level of care: Progressive Status is: Inpatient Remains inpatient appropriate because: Scheduled neb treatments, weaning from supplemental oxygen    Consultants:  None  Procedures:  Vas duplex ultrasound right lower extremity: Negative for DVT  Antimicrobials:  Doxycycline  12/22   Subjective: Patient seen examined bedside, lying in bed.  Remains on 2 L nasal cannula, with SpO2 97% at rest.  Dyspnea continues to slowly improve daily.  Remains on antibiotics, scheduled neb treatments, steroids.  Will attempt O2 walk screen today.  No other questions or concerns at this time.  Denies  headache, no dizziness, no chest pain, no palpitations, no fever/chills/night sweats, no nausea/vomiting/diarrhea, no focal weakness, no fatigue, no paresthesias.  No acute events overnight per nursing staff.  Objective: Vitals:   10/12/24 2116 10/13/24  0551 10/13/24 0906 10/13/24 1314  BP: 135/74 137/81  134/67  Pulse: 83 74  79  Resp: 18 18    Temp: 98 F (36.7 C) 98 F (36.7 C)  98.2 F (36.8 C)  TempSrc: Oral Oral  Oral  SpO2: 94% 95% 96% 95%  Weight:      Height:        Intake/Output Summary (Last 24 hours) at 10/13/2024 1337 Last data filed at 10/13/2024 1314 Gross per 24 hour  Intake 360 ml  Output 850 ml  Net -490 ml   Filed Weights   10/10/24 1736  Weight: 82.7 kg    Examination:  Physical Exam: GEN: NAD, alert and oriented x 3, obese HEENT: NCAT, PERRL, EOMI, sclera clear, MMM PULM: Breath sounds diminished bilateral bases without wheezes/crackles, normal respiratory effort without accessory muscle use, on 2 L nasal cannula SpO2 95% at rest CV: RRR w/o M/G/R GI: abd soft, NTND, + BS MSK: Trace bilateral lower extremity peripheral edema, moves all extremities independently with preserved muscle strength NEURO: No focal neurological deficit PSYCH: normal mood/affect Integumentary: No concerning rashes/lesions/wounds noted on exposed skin surfaces    Data Reviewed: I have personally reviewed following labs and imaging studies  CBC: Recent Labs  Lab 10/10/24 1838 10/10/24 2021 10/11/24 0658  WBC 6.2  --  6.2  NEUTROABS 4.5  --   --   HGB 10.5* 10.5* 10.4*  HCT 32.0* 31.0* 30.9*  MCV 95.2  --  92.8  PLT 161  --  257   Basic Metabolic Panel: Recent Labs  Lab 10/07/24 0550 10/07/24 1041 10/10/24 1838 10/10/24 2021 10/11/24 0020 10/11/24 0658 10/12/24 0410  NA 130*   < > 143 136 135 136 138  K 4.7  --  2.6* 4.0 4.4 4.7 5.0  CL 98  --  120* 101 100 100 104  CO2 20*  --  14*  --  22 23 25   GLUCOSE 125*  --  83 121* 164* 154* 180*  BUN 18  --  14 22 21 21  24*  CREATININE 1.29*  --  0.58 1.10* 1.10* 1.03* 1.12*  CALCIUM  9.8  --  6.1*  --  10.1 10.2 10.0  MG  --   --   --   --  2.4 2.3  --   PHOS  --   --   --   --   --  4.0  --    < > = values in this interval not displayed.    GFR: Estimated Creatinine Clearance: 46.8 mL/min (A) (by C-G formula based on SCr of 1.12 mg/dL (H)). Liver Function Tests: Recent Labs  Lab 10/11/24 0020 10/11/24 0658  AST 23 20  ALT 28 24  ALKPHOS 61 59  BILITOT 0.5 0.4  PROT 7.0 6.7  ALBUMIN  3.9 3.8   No results for input(s): LIPASE, AMYLASE in the last 168 hours. No results for input(s): AMMONIA in the last 168 hours. Coagulation Profile: No results for input(s): INR, PROTIME in the last 168 hours. Cardiac Enzymes: No results for input(s): CKTOTAL, CKMB, CKMBINDEX, TROPONINI in the last 168 hours. BNP (last 3 results) Recent Labs    10/03/24 1444  PROBNP 532.0*   HbA1C: No results for input(s): HGBA1C in the last 72 hours. CBG: No  results for input(s): GLUCAP in the last 168 hours. Lipid Profile: No results for input(s): CHOL, HDL, LDLCALC, TRIG, CHOLHDL, LDLDIRECT in the last 72 hours. Thyroid  Function Tests: No results for input(s): TSH, T4TOTAL, FREET4, T3FREE, THYROIDAB in the last 72 hours. Anemia Panel: No results for input(s): VITAMINB12, FOLATE, FERRITIN, TIBC, IRON, RETICCTPCT in the last 72 hours. Sepsis Labs: No results for input(s): PROCALCITON, LATICACIDVEN in the last 168 hours.  Recent Results (from the past 240 hours)  MRSA Next Gen by PCR, Nasal     Status: None   Collection Time: 10/03/24  9:45 PM   Specimen: Nasal Mucosa; Nasal Swab  Result Value Ref Range Status   MRSA by PCR Next Gen NOT DETECTED NOT DETECTED Final    Comment: (NOTE) The GeneXpert MRSA Assay (FDA approved for NASAL specimens only), is one component of a comprehensive MRSA colonization surveillance program. It is not intended to diagnose MRSA infection nor to guide or monitor treatment for MRSA infections. Test performance is not FDA approved in patients less than 80 years old. Performed at [facility], [address], [place], [place]  [address]   Resp panel by RT-PCR (RSV, Flu A&B, Covid) Anterior Nasal Swab     Status: None   Collection Time: 10/10/24  6:35 PM   Specimen: Anterior Nasal Swab  Result Value Ref Range Status   SARS Coronavirus 2 by RT PCR NEGATIVE NEGATIVE Final    Comment: (NOTE) SARS-CoV-2 target nucleic acids are NOT DETECTED.  The SARS-CoV-2 RNA is generally detectable in upper respiratory specimens during the acute phase of infection. The lowest concentration of SARS-CoV-2 viral copies this assay can detect is 138 copies/mL. A negative result does not preclude SARS-Cov-2 infection and should not be used as the sole basis for treatment or other patient management decisions. A negative result may occur with  improper specimen collection/handling, submission of specimen other than nasopharyngeal swab, presence of viral mutation(s) within the areas targeted by this assay, and inadequate number of viral copies(<138 copies/mL). A negative result must be combined with clinical observations, patient history, and epidemiological information. The expected result is Negative.  Fact Sheet for Patients:  bloggercourse.com  Fact Sheet for Healthcare Providers:  seriousbroker.it  This test is no t yet approved or cleared by the [place]  FDA and  has been authorized for detection and/or diagnosis of SARS-CoV-2 by FDA under an Emergency Use Authorization (EUA). This EUA will remain  in effect (meaning this test can be used) for the duration of the COVID-19 declaration under Section 564(b)(1) of the Act, 21 U.S.C.section 360bbb-3(b)(1), unless the authorization is terminated  or revoked sooner.       Influenza A by PCR NEGATIVE NEGATIVE Final   Influenza B by PCR NEGATIVE NEGATIVE Final    Comment: (NOTE) The Xpert Xpress SARS-CoV-2/FLU/RSV plus assay is intended as an aid in the diagnosis of influenza from Nasopharyngeal swab specimens and should not  be used as a sole basis for treatment. Nasal washings and aspirates are unacceptable for Xpert Xpress SARS-CoV-2/FLU/RSV testing.  Fact Sheet for Patients: bloggercourse.com  Fact Sheet for Healthcare Providers: seriousbroker.it  This test is not yet approved or cleared by the [place]  FDA and has been authorized for detection and/or diagnosis of SARS-CoV-2 by FDA under an Emergency Use Authorization (EUA). This EUA will remain in effect (meaning this test can be used) for the duration of the COVID-19 declaration under Section 564(b)(1) of the Act, 21 U.S.C. section 360bbb-3(b)(1), unless the  authorization is terminated or revoked.     Resp Syncytial Virus by PCR NEGATIVE NEGATIVE Final    Comment: (NOTE) Fact Sheet for Patients: bloggercourse.com  Fact Sheet for Healthcare Providers: seriousbroker.it  This test is not yet approved or cleared by the [place]  FDA and has been authorized for detection and/or diagnosis of SARS-CoV-2 by FDA under an Emergency Use Authorization (EUA). This EUA will remain in effect (meaning this test can be used) for the duration of the COVID-19 declaration under Section 564(b)(1) of the Act, 21 U.S.C. section 360bbb-3(b)(1), unless the authorization is terminated or revoked.  Performed at [facility], [address], [place], [place] [address]          Radiology Studies: No results found.       Scheduled Meds:  amLODipine   5 mg Oral Daily   atenolol   50 mg Oral Daily   budesonide -glycopyrrolate -formoterol   2 puff Inhalation BID   clopidogrel   75 mg Oral Daily   doxycycline   100 mg Oral Q12H   DULoxetine   60 mg Oral q AM   enoxaparin  (LOVENOX ) injection  40 mg Subcutaneous Q24H   feeding supplement  237 mL Oral BID BM   ferrous sulfate   325 mg Oral Q breakfast   ipratropium-albuterol   3 mL  Nebulization TID   isosorbide  mononitrate  30 mg Oral BID   levothyroxine   50 mcg Oral QAC breakfast   multivitamin with minerals  1 tablet Oral Daily   pantoprazole   40 mg Oral QAC breakfast   polyethylene glycol  17 g Oral Daily   predniSONE   40 mg Oral Q breakfast   simvastatin   40 mg Oral QHS   sodium chloride  flush  3 mL Intravenous Q12H   sodium chloride   2 g Oral TID WC   Continuous Infusions:     LOS: 3 days    Time spent: 46 minutes spent on 10/13/2024 caring for this patient face-to-face including chart review, ordering labs/tests, documenting, discussion with nursing staff, consultants, updating family and interview/physical exam    Camellia PARAS Amarianna Abplanalp, DO [facility] Available via Epic secure chat 7am-7pm After these hours, please refer to coverage provider listed on [email or web address] 10/13/2024, 1:37 PM   "

## 2024-10-13 NOTE — Progress Notes (Signed)
 PT Cancellation Note  Patient Details Name: Shannon Roberts MRN: [id number] DOB: 10/02/1951   Cancelled Treatment:    Reason Eval/Treat Not Completed:  Atttempted PT tx session-pt declined to participate at this time. Will check back as schedule allows.    Dannial SQUIBB, PT Acute Rehabilitation  Office: [phone]

## 2024-10-13 NOTE — Plan of Care (Signed)
" °  Problem: Education: Goal: Knowledge of General Education information will improve Description: Including pain rating scale, medication(s)/side effects and non-pharmacologic comfort measures Outcome: Progressing   Problem: Health Behavior/Discharge Planning: Goal: Ability to manage health-related needs will improve Outcome: Progressing   Problem: Clinical Measurements: Goal: Ability to maintain clinical measurements within normal limits will improve Outcome: Progressing Goal: Will remain free from infection Outcome: Progressing Goal: Diagnostic test results will improve Outcome: Progressing Goal: Respiratory complications will improve Outcome: Progressing Goal: Cardiovascular complication will be avoided Outcome: Progressing   Problem: Activity: Goal: Risk for activity intolerance will decrease Outcome: Progressing   Problem: Nutrition: Goal: Adequate nutrition will be maintained Outcome: Progressing   Problem: Coping: Goal: Level of anxiety will decrease Outcome: Progressing   Problem: Elimination: Goal: Will not experience complications related to bowel motility Outcome: Progressing Goal: Will not experience complications related to urinary retention Outcome: Progressing   Problem: Pain Managment: Goal: General experience of comfort will improve and/or be controlled Outcome: Progressing   Problem: Safety: Goal: Ability to remain free from injury will improve Outcome: Progressing   Problem: Skin Integrity: Goal: Risk for impaired skin integrity will decrease Outcome: Progressing   Problem: Education: Goal: Knowledge of disease or condition will improve Outcome: Progressing Goal: Knowledge of the prescribed therapeutic regimen will improve Outcome: Progressing Goal: Individualized Educational Video(s) Outcome: Progressing   Problem: Activity: Goal: Ability to tolerate increased activity will improve Outcome: Progressing Goal: Will verbalize the  importance of balancing activity with adequate rest periods Outcome: Progressing   Problem: Respiratory: Goal: Ability to maintain a clear airway will improve Outcome: Progressing Goal: Ability to maintain adequate ventilation will improve Outcome: Progressing   Problem: Respiratory: Goal: Levels of oxygenation will improve Outcome: Not Progressing   "

## 2024-10-13 NOTE — Progress Notes (Signed)
 Physical Therapy Treatment Patient Details Name: Shannon Roberts MRN: [id number] DOB: [birth year] Today's Date: 10/13/2024   History of Present Illness Pt is a 73 y/o female admitted for COPD exacerbation, acute resp failure PMH: COPD, hyponatremia, GERD, hypertension, HLD, anxiety, CVA, CKD, hypothyroidism, CAD, falls, shoulder surgery    PT Comments  Pt now agreeable to working with therapy. O2: 92% on RA at rest, 91% on RA with ambulation. Pt ambulated a short distance just outside of room into hallway then back to bed with a RW.  She is intermittently unsteady. Dyspnea 4/4, with increased work of breathing and fatigue. Cues for pursed lip breathing throughout session. Took pt about 10 minutes to recover symptomatically with application of 2L [place] O2-sats 95%. Pt too anxious and fatigued to attempt a 2nd bout of ambulation on 2L right away. Encouraged pt to try to ambulate again later with O2 to see if she tolerates activity any better. Pt expresses concerns about discharging home alone in her current condition. She is amenable to a short term rehab stay if that will help her to get stronger and improve her activity tolerance. Recommend TOC consult. Will update recommendations at this time. Will continue to follow pt.    If plan is discharge home, recommend the following: A little help with walking and/or transfers;A little help with bathing/dressing/bathroom;Assistance with cooking/housework;Assist for transportation;Help with stairs or ramp for entrance   Can travel by private vehicle     Yes  Equipment Recommendations  None recommended by PT    Recommendations for Other Services       Precautions / Restrictions Precautions Precautions: Fall Precaution/Restrictions Comments: monitor O2 Restrictions Weight Bearing Restrictions Per Provider Order: No     Mobility  Bed Mobility Overal bed mobility: Needs Assistance Bed Mobility: Supine to Sit, Sit to Supine     Supine to sit: HOB  elevated, Used rails, Supervision Sit to supine: Supervision, HOB elevated, Used rails   General bed mobility comments: Supv for lines    Transfers Overall transfer level: Needs assistance Equipment used: Rolling walker (2 wheels) Transfers: Sit to/from Stand Sit to Stand: Supervision           General transfer comment: mildly unsteady. cues for pursed lip/deep breathing.    Ambulation/Gait Ambulation/Gait assistance: Contact guard assist Gait Distance (Feet): 35 Feet Assistive device: Rolling walker (2 wheels) Gait Pattern/deviations: Step-through pattern, Decreased stride length       General Gait Details: Pt ambulated a short distance just outside of room into hallway then back to bed with a RW.  She is intermittently unsteady. O2 91% on RA, dyspnea 4/4, with increaesed work of breathing and fatigue. Cues for pursed lip breathing. Took pt about 10 minutes to recover symptomatically while wearing 2L [place] O2.   Stairs             Wheelchair Mobility     Tilt Bed    Modified Rankin (Stroke Patients Only)       Balance Overall balance assessment: Needs assistance, History of Falls         Standing balance support: During functional activity, Bilateral upper extremity supported Standing balance-Leahy Scale: Poor                              Communication Communication Communication: No apparent difficulties  Cognition Arousal: Alert Behavior During Therapy: WFL for tasks assessed/performed, Anxious   PT - Cognitive impairments: No apparent impairments  Following commands: Intact      Cueing Cueing Techniques: Verbal cues  Exercises      General Comments        Pertinent Vitals/Pain Pain Assessment Pain Assessment: No/denies pain    Home Living                          Prior Function            PT Goals (current goals can now be found in the care plan section) Progress towards  PT goals: Progressing toward goals    Frequency    Min 3X/week      PT Plan      Co-evaluation              AM-PAC PT 6 Clicks Mobility   Outcome Measure  Help needed turning from your back to your side while in a flat bed without using bedrails?: None Help needed moving from lying on your back to sitting on the side of a flat bed without using bedrails?: None Help needed moving to and from a bed to a chair (including a wheelchair)?: A Little Help needed standing up from a chair using your arms (e.g., wheelchair or bedside chair)?: A Little Help needed to walk in hospital room?: A Little Help needed climbing 3-5 steps with a railing? : Total 6 Click Score: 18    End of Session Equipment Utilized During Treatment: Oxygen Activity Tolerance: Patient limited by fatigue (limited by anxiety, dyspnea) Patient left: in bed;with call bell/phone within reach;with bed alarm set   PT Visit Diagnosis: History of falling (Z91.81);Unsteadiness on feet (R26.81);Difficulty in walking, not elsewhere classified (R26.2)     Time: 1202-1229 PT Time Calculation (min) (ACUTE ONLY): 27 min  Charges:    $Gait Training: 23-37 mins PT General Charges $$ ACUTE PT VISIT: 1 Visit                       Dannial SQUIBB, PT Acute Rehabilitation  Office: [phone]

## 2024-10-13 NOTE — Plan of Care (Signed)
" °  Problem: Education: Goal: Knowledge of General Education information will improve Description: Including pain rating scale, medication(s)/side effects and non-pharmacologic comfort measures Outcome: Progressing   Problem: Health Behavior/Discharge Planning: Goal: Ability to manage health-related needs will improve Outcome: Progressing   Problem: Clinical Measurements: Goal: Ability to maintain clinical measurements within normal limits will improve Outcome: Progressing Goal: Will remain free from infection Outcome: Progressing Goal: Diagnostic test results will improve Outcome: Progressing Goal: Respiratory complications will improve Outcome: Progressing Goal: Cardiovascular complication will be avoided Outcome: Progressing   Problem: Activity: Goal: Risk for activity intolerance will decrease Outcome: Progressing   Problem: Nutrition: Goal: Adequate nutrition will be maintained Outcome: Progressing   Problem: Coping: Goal: Level of anxiety will decrease Outcome: Progressing   Problem: Elimination: Goal: Will not experience complications related to urinary retention Outcome: Progressing   Problem: Pain Managment: Goal: General experience of comfort will improve and/or be controlled Outcome: Progressing   Problem: Safety: Goal: Ability to remain free from injury will improve Outcome: Progressing   Problem: Skin Integrity: Goal: Risk for impaired skin integrity will decrease Outcome: Progressing   Problem: Education: Goal: Knowledge of disease or condition will improve Outcome: Progressing Goal: Knowledge of the prescribed therapeutic regimen will improve Outcome: Progressing Goal: Individualized Educational Video(s) Outcome: Progressing   Problem: Activity: Goal: Ability to tolerate increased activity will improve Outcome: Progressing Goal: Will verbalize the importance of balancing activity with adequate rest periods Outcome: Progressing   Problem:  Respiratory: Goal: Ability to maintain a clear airway will improve Outcome: Progressing Goal: Levels of oxygenation will improve Outcome: Progressing Goal: Ability to maintain adequate ventilation will improve Outcome: Progressing   Problem: Elimination: Goal: Will not experience complications related to bowel motility Outcome: Not Progressing   "

## 2024-10-14 DIAGNOSIS — J441 Chronic obstructive pulmonary disease with (acute) exacerbation: Secondary | ICD-10-CM | POA: Diagnosis not present

## 2024-10-14 MED ORDER — MELATONIN 3 MG PO TABS
3.0000 mg | ORAL_TABLET | Freq: Once | ORAL | Status: AC
  Administered 2024-10-14: 3 mg via ORAL
  Filled 2024-10-14: qty 1

## 2024-10-14 MED ORDER — MELATONIN 3 MG PO TABS
3.0000 mg | ORAL_TABLET | Freq: Every day | ORAL | Status: DC
  Administered 2024-10-14 – 2024-10-22 (×9): 3 mg via ORAL
  Filled 2024-10-14 (×4): qty 1

## 2024-10-14 NOTE — Plan of Care (Signed)

## 2024-10-14 NOTE — Progress Notes (Signed)
 " PROGRESS NOTE    Shannon Roberts  FMW:[id number] DOB: 1951/04/16 DOA: 10/10/2024 PCP: Okey Carlin Redbird, MD    Brief Narrative:   Shannon Roberts is a 73 y.o. female with past medical history significant for HTN, HLD, CKD stage IIIa, hypothyroidism, COPD, SIADH, obesity, anxiety, recent COVID-19 viral infection who presented to [facility] ED on 10/10/2024 via EMS from home with complaints of progressive shortness of breath, worse with exertion.  Denies any sick contacts, no fevers, no chills, no chest pain.  Patient recently admitted for hyponatremia secondary to SIADH in which she was found to have a sodium of 119, patient was started on salt tablets and fluid restriction with improvement of sodium to 130 at time of discharge.  Additionally, patient was also discharged on 2 L nasal cannula related to her COPD and recent COVID infection.  In the ED, temperature 97.9 F, HR 83, RR 19, BP 147/72, SpO2 96% on 2L [place].  WBC 6.2, hemoglobin 10.5, plate count 838.  Sodium 143, potassium 2.6, chloride 120, CO2 14, glucose 83, BUN 14, creatinine 0.58.  Calcium  6.1.  Anion gap 9.  COVID/influenza/RSV PCR negative.  Chest x-ray with no active cardiopulmonary disease process.  Patient was started on IV Solu-Medrol , given IV magnesium  DuoNebs, potassium supplements and albuterol  neb.  TRH consulted for admission for further evaluation and management of COPD exacerbation, concern for community-acquired pneumonia.  Assessment & Plan:   Acute hypoxic respiratory failure, POA COPD exacerbation Patient presenting with progressive shortness of breath, not oxygen dependent at baseline although was recently discharged on home oxygen from recent Covid-19 viral infection.  Has underlying COPD, follows with pulmonology Dr. Kara outpatient.  Patient is afebrile without leukocytosis.  Chest x-ray with no focal consolidation. -- Doxycycline  100 mg p.o. twice daily -- Breztri  2 puffs twice daily (substituted  for home Trelegy Ellipta ) -- Prednisone  40 mg p.o. daily -- DuoNeb every 6 hours scheduled -- Albuterol  neb every 2 hours.  Wheezing/shortness of breath -- Continue supplemental oxygen to maintain SpO2 greater than 88%; O2 walk screen today  HTN CAD HLD -- Atenolol  50 g p.o. daily -- Isosorbide  mononitrate 30 m p.o. daily -- Plavix  75 mg p.o. daily -- Simvastatin  40 mg p.o. daily  CKD stage IIIa -- Cr 1.10>1.12; stable  Hypothyroidism -- Levothyroxine  50 mcg p.o. daily  SIADH -- Na 864>863>861 -- Sodium chloride  2 g p.o. 3 times daily AC -- 1200 mL fluid restriction  GERD -- Protonix  40 mg p.o. daily  Anxiety -- Lorazepam  0.5 - 0.75 mg p.o. every 6 hours as needed anxiety  Obesity, class I Body mass index is 30.34 kg/m.   Weakness/debility/deconditioning: --  PT now recommending SNF placement -- TOC consultation   DVT prophylaxis: enoxaparin  (LOVENOX ) injection 40 mg Start: 10/12/24 1200 SCDs Start: 10/11/24 0343    Code Status: Full Code Family Communication: No family present at bedside this morning  Disposition Plan:  Level of care: Telemetry Status is: Inpatient Remains inpatient appropriate because: Scheduled neb treatments, weaning from supplemental oxygen    Consultants:  None  Procedures:  Vas duplex ultrasound right lower extremity: Negative for DVT  Antimicrobials:  Doxycycline  12/22   Subjective: Patient seen examined bedside, lying in bed.  Remains on 2 L nasal cannula, with SpO2 97% at rest.  Dyspnea continues to slowly improve daily.  Remains on antibiotics, scheduled neb treatments, steroids.  Discussed with patient recommendations for SNF placement by therapy, agreeable.  No other questions or concerns at  this time.  Denies headache, no dizziness, no chest pain, no palpitations, no fever/chills/night sweats, no nausea/vomiting/diarrhea, no focal weakness, no fatigue, no paresthesias.  No acute events overnight per nursing  staff.  Objective: Vitals:   10/13/24 1940 10/14/24 0432 10/14/24 0941 10/14/24 1310  BP: (!) 143/71 (!) 144/80  132/75  Pulse: 80 80  78  Resp: 16 15  15   Temp: 98 F (36.7 C) 98.2 F (36.8 C)  97.8 F (36.6 C)  TempSrc: Oral     SpO2: 97% 100% 95% 97%  Weight:      Height:        Intake/Output Summary (Last 24 hours) at 10/14/2024 1346 Last data filed at 10/14/2024 1008 Gross per 24 hour  Intake 740 ml  Output 500 ml  Net 240 ml   Filed Weights   10/10/24 1736  Weight: 82.7 kg    Examination:  Physical Exam: GEN: NAD, alert and oriented x 3, obese HEENT: NCAT, PERRL, EOMI, sclera clear, MMM PULM: Breath sounds diminished bilateral bases without wheezes/crackles, normal respiratory effort without accessory muscle use, on 2 L nasal cannula SpO2 97% at rest CV: RRR w/o M/G/R GI: abd soft, NTND, + BS MSK: Trace bilateral lower extremity peripheral edema, moves all extremities independently with preserved muscle strength NEURO: No focal neurological deficit PSYCH: normal mood/affect Integumentary: No concerning rashes/lesions/wounds noted on exposed skin surfaces    Data Reviewed: I have personally reviewed following labs and imaging studies  CBC: Recent Labs  Lab 10/10/24 1838 10/10/24 2021 10/11/24 0658  WBC 6.2  --  6.2  NEUTROABS 4.5  --   --   HGB 10.5* 10.5* 10.4*  HCT 32.0* 31.0* 30.9*  MCV 95.2  --  92.8  PLT 161  --  257   Basic Metabolic Panel: Recent Labs  Lab 10/10/24 1838 10/10/24 2021 10/11/24 0020 10/11/24 0658 10/12/24 0410  NA 143 136 135 136 138  K 2.6* 4.0 4.4 4.7 5.0  CL 120* 101 100 100 104  CO2 14*  --  22 23 25   GLUCOSE 83 121* 164* 154* 180*  BUN 14 22 21 21  24*  CREATININE 0.58 1.10* 1.10* 1.03* 1.12*  CALCIUM  6.1*  --  10.1 10.2 10.0  MG  --   --  2.4 2.3  --   PHOS  --   --   --  4.0  --    GFR: Estimated Creatinine Clearance: 46.8 mL/min (A) (by C-G formula based on SCr of 1.12 mg/dL (H)). Liver Function  Tests: Recent Labs  Lab 10/11/24 0020 10/11/24 0658  AST 23 20  ALT 28 24  ALKPHOS 61 59  BILITOT 0.5 0.4  PROT 7.0 6.7  ALBUMIN  3.9 3.8   No results for input(s): LIPASE, AMYLASE in the last 168 hours. No results for input(s): AMMONIA in the last 168 hours. Coagulation Profile: No results for input(s): INR, PROTIME in the last 168 hours. Cardiac Enzymes: No results for input(s): CKTOTAL, CKMB, CKMBINDEX, TROPONINI in the last 168 hours. BNP (last 3 results) Recent Labs    10/03/24 1444  PROBNP 532.0*   HbA1C: No results for input(s): HGBA1C in the last 72 hours. CBG: No results for input(s): GLUCAP in the last 168 hours. Lipid Profile: No results for input(s): CHOL, HDL, LDLCALC, TRIG, CHOLHDL, LDLDIRECT in the last 72 hours. Thyroid  Function Tests: No results for input(s): TSH, T4TOTAL, FREET4, T3FREE, THYROIDAB in the last 72 hours. Anemia Panel: No results for input(s): VITAMINB12, FOLATE, FERRITIN, TIBC, IRON,  RETICCTPCT in the last 72 hours. Sepsis Labs: No results for input(s): PROCALCITON, LATICACIDVEN in the last 168 hours.  Recent Results (from the past 240 hours)  Resp panel by RT-PCR (RSV, Flu A&B, Covid) Anterior Nasal Swab     Status: None   Collection Time: 10/10/24  6:35 PM   Specimen: Anterior Nasal Swab  Result Value Ref Range Status   SARS Coronavirus 2 by RT PCR NEGATIVE NEGATIVE Final    Comment: (NOTE) SARS-CoV-2 target nucleic acids are NOT DETECTED.  The SARS-CoV-2 RNA is generally detectable in upper respiratory specimens during the acute phase of infection. The lowest concentration of SARS-CoV-2 viral copies this assay can detect is 138 copies/mL. A negative result does not preclude SARS-Cov-2 infection and should not be used as the sole basis for treatment or other patient management decisions. A negative result may occur with  improper specimen collection/handling, submission of  specimen other than nasopharyngeal swab, presence of viral mutation(s) within the areas targeted by this assay, and inadequate number of viral copies(<138 copies/mL). A negative result must be combined with clinical observations, patient history, and epidemiological information. The expected result is Negative.  Fact Sheet for Patients:  bloggercourse.com  Fact Sheet for Healthcare Providers:  seriousbroker.it  This test is no t yet approved or cleared by the [place]  FDA and  has been authorized for detection and/or diagnosis of SARS-CoV-2 by FDA under an Emergency Use Authorization (EUA). This EUA will remain  in effect (meaning this test can be used) for the duration of the COVID-19 declaration under Section 564(b)(1) of the Act, 21 U.S.C.section 360bbb-3(b)(1), unless the authorization is terminated  or revoked sooner.       Influenza A by PCR NEGATIVE NEGATIVE Final   Influenza B by PCR NEGATIVE NEGATIVE Final    Comment: (NOTE) The Xpert Xpress SARS-CoV-2/FLU/RSV plus assay is intended as an aid in the diagnosis of influenza from Nasopharyngeal swab specimens and should not be used as a sole basis for treatment. Nasal washings and aspirates are unacceptable for Xpert Xpress SARS-CoV-2/FLU/RSV testing.  Fact Sheet for Patients: bloggercourse.com  Fact Sheet for Healthcare Providers: seriousbroker.it  This test is not yet approved or cleared by the [place]  FDA and has been authorized for detection and/or diagnosis of SARS-CoV-2 by FDA under an Emergency Use Authorization (EUA). This EUA will remain in effect (meaning this test can be used) for the duration of the COVID-19 declaration under Section 564(b)(1) of the Act, 21 U.S.C. section 360bbb-3(b)(1), unless the authorization is terminated or revoked.     Resp Syncytial Virus by PCR NEGATIVE NEGATIVE Final     Comment: (NOTE) Fact Sheet for Patients: bloggercourse.com  Fact Sheet for Healthcare Providers: seriousbroker.it  This test is not yet approved or cleared by the [place]  FDA and has been authorized for detection and/or diagnosis of SARS-CoV-2 by FDA under an Emergency Use Authorization (EUA). This EUA will remain in effect (meaning this test can be used) for the duration of the COVID-19 declaration under Section 564(b)(1) of the Act, 21 U.S.C. section 360bbb-3(b)(1), unless the authorization is terminated or revoked.  Performed at [facility], [address] [place], [place] [address]          Radiology Studies: No results found.       Scheduled Meds:  amLODipine   5 mg Oral Daily   atenolol   50 mg Oral Daily   budesonide -glycopyrrolate -formoterol   2 puff Inhalation BID   clopidogrel   75 mg Oral Daily  doxycycline   100 mg Oral Q12H   DULoxetine   60 mg Oral q AM   enoxaparin  (LOVENOX ) injection  40 mg Subcutaneous Q24H   feeding supplement  237 mL Oral BID BM   ferrous sulfate   325 mg Oral Q breakfast   ipratropium-albuterol   3 mL Nebulization TID   isosorbide  mononitrate  30 mg Oral BID   levothyroxine   50 mcg Oral QAC breakfast   multivitamin with minerals  1 tablet Oral Daily   pantoprazole   40 mg Oral QAC breakfast   polyethylene glycol  17 g Oral Daily   predniSONE   40 mg Oral Q breakfast   senna-docusate  1 tablet Oral BID   simvastatin   40 mg Oral QHS   sodium chloride  flush  3 mL Intravenous Q12H   sodium chloride   2 g Oral TID WC   Continuous Infusions:     LOS: 4 days    Time spent: 46 minutes spent on 10/14/2024 caring for this patient face-to-face including chart review, ordering labs/tests, documenting, discussion with nursing staff, consultants, updating family and interview/physical exam    Camellia PARAS Makalya Nave, DO [facility] Available via Epic secure  chat 7am-7pm After these hours, please refer to coverage provider listed on [email or web address] 10/14/2024, 1:46 PM   "

## 2024-10-15 DIAGNOSIS — J441 Chronic obstructive pulmonary disease with (acute) exacerbation: Secondary | ICD-10-CM | POA: Diagnosis not present

## 2024-10-15 LAB — BASIC METABOLIC PANEL WITH GFR
Anion gap: 9 (ref 5–15)
BUN: 17 mg/dL (ref 8–23)
CO2: 25 mmol/L (ref 22–32)
Calcium: 9.8 mg/dL (ref 8.9–10.3)
Chloride: 99 mmol/L (ref 98–111)
Creatinine, Ser: 1 mg/dL (ref 0.44–1.00)
GFR, Estimated: 59 mL/min — ABNORMAL LOW
Glucose, Bld: 117 mg/dL — ABNORMAL HIGH (ref 70–99)
Potassium: 4.6 mmol/L (ref 3.5–5.1)
Sodium: 134 mmol/L — ABNORMAL LOW (ref 135–145)

## 2024-10-15 NOTE — Progress Notes (Addendum)
" ° °  Inpatient Rehabilitation Admissions Coordinator   Rehab consult received. Patient is CGA assist. Recent admit for COVID. Not in need of CIR level rehab.Therapy does not recommend CIR. Recommend Home with [facility] and 24/7 caregiver support or SNF if that is not available. I spoke with patient's daughter, Mercy, by phone as she called me to discuss rehab options.  Heron Leavell, RN, MSN Rehab Admissions Coordinator [phone] 10/15/2024 8:50 AM  "

## 2024-10-15 NOTE — Progress Notes (Addendum)
 " PROGRESS NOTE    Shannon Roberts  FMW:[id number] DOB: [birth year] DOA: 10/10/2024 PCP: Okey Carlin Redbird, MD    Brief Narrative:   Shannon Roberts is a 73 y.o. female with past medical history significant for HTN, HLD, CKD stage IIIa, hypothyroidism, COPD, SIADH, obesity, anxiety, recent COVID-19 viral infection who presented to [facility] ED on 10/10/2024 via EMS from home with complaints of progressive shortness of breath, worse with exertion.  Denies any sick contacts, no fevers, no chills, no chest pain.  Patient recently admitted for hyponatremia secondary to SIADH in which she was found to have a sodium of 119, patient was started on salt tablets and fluid restriction with improvement of sodium to 130 at time of discharge.  Additionally, patient was also discharged on 2 L nasal cannula related to her COPD and recent COVID infection.  In the ED, temperature 97.9 F, HR 83, RR 19, BP 147/72, SpO2 96% on 2L [place].  WBC 6.2, hemoglobin 10.5, plate count 838.  Sodium 143, potassium 2.6, chloride 120, CO2 14, glucose 83, BUN 14, creatinine 0.58.  Calcium  6.1.  Anion gap 9.  COVID/influenza/RSV PCR negative.  Chest x-ray with no active cardiopulmonary disease process.  Patient was started on IV Solu-Medrol , given IV magnesium  DuoNebs, potassium supplements and albuterol  neb.  TRH consulted for admission for further evaluation and management of COPD exacerbation, concern for community-acquired pneumonia.  Assessment & Plan:   Acute hypoxic respiratory failure, POA COPD exacerbation Patient presenting with progressive shortness of breath, not oxygen dependent at baseline although was recently discharged on home oxygen from recent Covid-19 viral infection.  Has underlying COPD, follows with pulmonology Dr. Kara outpatient.  Patient is afebrile without leukocytosis.  Chest x-ray with no focal consolidation. -- Doxycycline  100 mg p.o. twice daily x 7 days -- Breztri  2 puffs twice daily  (substituted for home Trelegy Ellipta ) -- Complete 5-day course of prednisone  today -- DuoNeb every 6 hours scheduled -- Albuterol  neb every 2 hours.  Wheezing/shortness of breath -- Continue supplemental oxygen to maintain SpO2 greater than 88%;   HTN CAD HLD -- Atenolol  50 mg p.o. daily -- Isosorbide  mononitrate 30 m p.o. daily -- Plavix  75 mg p.o. daily -- Simvastatin  40 mg p.o. daily  CKD stage IIIa -- Cr 1.10>1.12>1.00; stable  Hypothyroidism -- Levothyroxine  50 mcg p.o. daily  SIADH -- Na 135>136>138>134 -- Sodium chloride  2 g p.o. 3 times daily AC -- 1200 mL fluid restriction  GERD -- Protonix  40 mg p.o. daily  Anxiety -- Lorazepam  0.5 - 0.75 mg p.o. every 6 hours as needed anxiety  Obesity, class I Body mass index is 30.34 kg/m.   Weakness/debility/deconditioning: -- PT now recommending SNF placement -- TOC consultation   DVT prophylaxis: enoxaparin  (LOVENOX ) injection 40 mg Start: 10/12/24 1200 SCDs Start: 10/11/24 0343    Code Status: Full Code Family Communication: No family present at bedside this morning, updated patient's daughter Shannon Roberts via telephone yesterday afternoon  Disposition Plan:  Level of care: Telemetry Status is: Inpatient Remains inpatient appropriate because: Scheduled neb treatments, weaning from supplemental oxygen    Consultants:  None  Procedures:  Vas duplex ultrasound right lower extremity: Negative for DVT  Antimicrobials:  Doxycycline  12/22>>   Subjective: Patient seen examined bedside, lying in bed.  Remains on 2 L nasal cannula, with SpO2 99% at rest.  Continues with improved dyspnea, worse with exertion.  Remains weak and deconditioned.  Updated patient's daughter, Shannon Roberts via telephone yesterday afternoon who was desiring  her mother to go to CIR for rehabilitation.  Consulted CIR, unfortunately they believe she does not meet the requirements for inpatient rehab and recommended home health versus SNF.  To complete  oral prednisone  today.  No other questions or concerns at this time.  Denies headache, no dizziness, no chest pain, no palpitations, no fever/chills/night sweats, no nausea/vomiting/diarrhea, no focal weakness, no fatigue, no paresthesias.  No acute events overnight per nursing staff.  Objective: Vitals:   10/14/24 2035 10/15/24 0456 10/15/24 0838 10/15/24 1002  BP: 121/69 138/74 (!) 149/76   Pulse: 77 75 81   Resp: 16 16    Temp: 98.1 F (36.7 C) 98.3 F (36.8 C) 98 F (36.7 C)   TempSrc: Oral Oral Oral   SpO2: 95% 99% 97% 98%  Weight:      Height:        Intake/Output Summary (Last 24 hours) at 10/15/2024 1304 Last data filed at 10/14/2024 2200 Gross per 24 hour  Intake 240 ml  Output --  Net 240 ml   Filed Weights   10/10/24 1736  Weight: 82.7 kg    Examination:  Physical Exam: GEN: NAD, alert and oriented x 3, obese HEENT: NCAT, PERRL, EOMI, sclera clear, MMM PULM: Breath sounds diminished bilateral bases without wheezes/crackles, normal respiratory effort without accessory muscle use, on 2 L nasal cannula SpO2 99% at rest CV: RRR w/o M/G/R GI: abd soft, NTND, + BS MSK: Trace bilateral lower extremity peripheral edema, moves all extremities independently with preserved muscle strength NEURO: No focal neurological deficit PSYCH: normal mood/affect Integumentary: No concerning rashes/lesions/wounds noted on exposed skin surfaces    Data Reviewed: I have personally reviewed following labs and imaging studies  CBC: Recent Labs  Lab 10/10/24 1838 10/10/24 2021 10/11/24 0658  WBC 6.2  --  6.2  NEUTROABS 4.5  --   --   HGB 10.5* 10.5* 10.4*  HCT 32.0* 31.0* 30.9*  MCV 95.2  --  92.8  PLT 161  --  257   Basic Metabolic Panel: Recent Labs  Lab 10/10/24 1838 10/10/24 2021 10/11/24 0020 10/11/24 0658 10/12/24 0410 10/15/24 0337  NA 143 136 135 136 138 134*  K 2.6* 4.0 4.4 4.7 5.0 4.6  CL 120* 101 100 100 104 99  CO2 14*  --  22 23 25 25   GLUCOSE 83  121* 164* 154* 180* 117*  BUN 14 22 21 21  24* 17  CREATININE 0.58 1.10* 1.10* 1.03* 1.12* 1.00  CALCIUM  6.1*  --  10.1 10.2 10.0 9.8  MG  --   --  2.4 2.3  --   --   PHOS  --   --   --  4.0  --   --    GFR: Estimated Creatinine Clearance: 52.4 mL/min (by C-G formula based on SCr of 1 mg/dL). Liver Function Tests: Recent Labs  Lab 10/11/24 0020 10/11/24 0658  AST 23 20  ALT 28 24  ALKPHOS 61 59  BILITOT 0.5 0.4  PROT 7.0 6.7  ALBUMIN  3.9 3.8   No results for input(s): LIPASE, AMYLASE in the last 168 hours. No results for input(s): AMMONIA in the last 168 hours. Coagulation Profile: No results for input(s): INR, PROTIME in the last 168 hours. Cardiac Enzymes: No results for input(s): CKTOTAL, CKMB, CKMBINDEX, TROPONINI in the last 168 hours. BNP (last 3 results) Recent Labs    10/03/24 1444  PROBNP 532.0*   HbA1C: No results for input(s): HGBA1C in the last 72 hours. CBG: No  results for input(s): GLUCAP in the last 168 hours. Lipid Profile: No results for input(s): CHOL, HDL, LDLCALC, TRIG, CHOLHDL, LDLDIRECT in the last 72 hours. Thyroid  Function Tests: No results for input(s): TSH, T4TOTAL, FREET4, T3FREE, THYROIDAB in the last 72 hours. Anemia Panel: No results for input(s): VITAMINB12, FOLATE, FERRITIN, TIBC, IRON, RETICCTPCT in the last 72 hours. Sepsis Labs: No results for input(s): PROCALCITON, LATICACIDVEN in the last 168 hours.  Recent Results (from the past 240 hours)  Resp panel by RT-PCR (RSV, Flu A&B, Covid) Anterior Nasal Swab     Status: None   Collection Time: 10/10/24  6:35 PM   Specimen: Anterior Nasal Swab  Result Value Ref Range Status   SARS Coronavirus 2 by RT PCR NEGATIVE NEGATIVE Final    Comment: (NOTE) SARS-CoV-2 target nucleic acids are NOT DETECTED.  The SARS-CoV-2 RNA is generally detectable in upper respiratory specimens during the acute phase of infection. The  lowest concentration of SARS-CoV-2 viral copies this assay can detect is 138 copies/mL. A negative result does not preclude SARS-Cov-2 infection and should not be used as the sole basis for treatment or other patient management decisions. A negative result may occur with  improper specimen collection/handling, submission of specimen other than nasopharyngeal swab, presence of viral mutation(s) within the areas targeted by this assay, and inadequate number of viral copies(<138 copies/mL). A negative result must be combined with clinical observations, patient history, and epidemiological information. The expected result is Negative.  Fact Sheet for Patients:  bloggercourse.com  Fact Sheet for Healthcare Providers:  seriousbroker.it  This test is no t yet approved or cleared by the [place]  FDA and  has been authorized for detection and/or diagnosis of SARS-CoV-2 by FDA under an Emergency Use Authorization (EUA). This EUA will remain  in effect (meaning this test can be used) for the duration of the COVID-19 declaration under Section 564(b)(1) of the Act, 21 U.S.C.section 360bbb-3(b)(1), unless the authorization is terminated  or revoked sooner.       Influenza A by PCR NEGATIVE NEGATIVE Final   Influenza B by PCR NEGATIVE NEGATIVE Final    Comment: (NOTE) The Xpert Xpress SARS-CoV-2/FLU/RSV plus assay is intended as an aid in the diagnosis of influenza from Nasopharyngeal swab specimens and should not be used as a sole basis for treatment. Nasal washings and aspirates are unacceptable for Xpert Xpress SARS-CoV-2/FLU/RSV testing.  Fact Sheet for Patients: bloggercourse.com  Fact Sheet for Healthcare Providers: seriousbroker.it  This test is not yet approved or cleared by the [place]  FDA and has been authorized for detection and/or diagnosis of SARS-CoV-2 by FDA under  an Emergency Use Authorization (EUA). This EUA will remain in effect (meaning this test can be used) for the duration of the COVID-19 declaration under Section 564(b)(1) of the Act, 21 U.S.C. section 360bbb-3(b)(1), unless the authorization is terminated or revoked.     Resp Syncytial Virus by PCR NEGATIVE NEGATIVE Final    Comment: (NOTE) Fact Sheet for Patients: bloggercourse.com  Fact Sheet for Healthcare Providers: seriousbroker.it  This test is not yet approved or cleared by the [place]  FDA and has been authorized for detection and/or diagnosis of SARS-CoV-2 by FDA under an Emergency Use Authorization (EUA). This EUA will remain in effect (meaning this test can be used) for the duration of the COVID-19 declaration under Section 564(b)(1) of the Act, 21 U.S.C. section 360bbb-3(b)(1), unless the authorization is terminated or revoked.  Performed at [facility], [address] [place], [place]  [address]          Radiology Studies: No results found.       Scheduled Meds:  amLODipine   5 mg Oral Daily   atenolol   50 mg Oral Daily   budesonide -glycopyrrolate -formoterol   2 puff Inhalation BID   clopidogrel   75 mg Oral Daily   doxycycline   100 mg Oral Q12H   DULoxetine   60 mg Oral q AM   enoxaparin  (LOVENOX ) injection  40 mg Subcutaneous Q24H   feeding supplement  237 mL Oral BID BM   ferrous sulfate   325 mg Oral Q breakfast   ipratropium-albuterol   3 mL Nebulization TID   isosorbide  mononitrate  30 mg Oral BID   levothyroxine   50 mcg Oral QAC breakfast   melatonin  3 mg Oral QHS   multivitamin with minerals  1 tablet Oral Daily   pantoprazole   40 mg Oral QAC breakfast   polyethylene glycol  17 g Oral Daily   senna-docusate  1 tablet Oral BID   simvastatin   40 mg Oral QHS   sodium chloride  flush  3 mL Intravenous Q12H   sodium chloride   2 g Oral TID WC   Continuous Infusions:      LOS: 5 days    Time spent: 46 minutes spent on 10/15/2024 caring for this patient face-to-face including chart review, ordering labs/tests, documenting, discussion with nursing staff, consultants, updating family and interview/physical exam    Camellia PARAS Kerri Asche, DO [facility] Available via Epic secure chat 7am-7pm After these hours, please refer to coverage provider listed on [email or web address] 10/15/2024, 1:04 PM   "

## 2024-10-15 NOTE — Plan of Care (Signed)
  Problem: Elimination: Goal: Will not experience complications related to bowel motility Outcome: Progressing   Problem: Elimination: Goal: Will not experience complications related to urinary retention Outcome: Progressing   Problem: Pain Managment: Goal: General experience of comfort will improve and/or be controlled Outcome: Progressing   Problem: Safety: Goal: Ability to remain free from injury will improve Outcome: Progressing   Problem: Skin Integrity: Goal: Risk for impaired skin integrity will decrease Outcome: Progressing

## 2024-10-15 NOTE — Progress Notes (Signed)
 Physical Therapy Treatment Patient Details Name: LIBRA GATZ MRN: [id number] DOB: December 06, 1950 Today's Date: 10/15/2024   History of Present Illness Pt is a 73 y/o female admitted for COPD exacerbation, acute resp failure PMH: COPD, hyponatremia, GERD, hypertension, HLD, anxiety, CVA, CKD, hypothyroidism, CAD, falls, shoulder surgery    PT Comments  PT - Cognition Comments: AxO x 3 pleasant Lady who was living home alone.  Hx COPD and heas been on 2 lts.  Requires increased time and instructions to keep her anxiety down.  Assisted OOB to [facility] then to amb was limited bu dyspnea and rest breaks between each activity. General transfer comment: Pt physically able to rise with good use of hands to steady self.  Pt required an extended rest break after using BSC before attempting amb due to 3/4 dyspnea. General Gait Details: limited amb distance 12 feet on 2 lts with sats 96% and HR 87 and dyspnea 3/4.  Max VC's on Purse Lip breathing.  Required an extneded rest break. Left Pt seated EOB when Respiratory Therapist arrived. Prior to admit:  Home Living Family/patient expects to be discharged to:: Private residence Living Arrangements: Alone Available Help at Discharge: Family;Available PRN/intermittently Type of Home: House Home Access: Stairs to enter Entrance Stairs-Rails: Left Entrance Stairs-Number of Steps: 3 Home Layout: One level Home Equipment: Shower seat;Cane - single point;Rolling Walker (2 wheels)       Prior Function Prior Level of Function : Independent/Modified Independent;Driving Mobility Comments: using RW, O2 most recently-since last admission. ADLs Comments: She was independent with ADLs, cooking, cleaning and driving.     LPT has rec Pt will need ST Rehab at SNF to address mobility and functional decline prior to safely returning home.    If plan is discharge home, recommend the following: A little help with walking and/or transfers;A little help with  bathing/dressing/bathroom;Assistance with cooking/housework;Assist for transportation;Help with stairs or ramp for entrance   Can travel by private vehicle     Yes  Equipment Recommendations  None recommended by PT    Recommendations for Other Services       Precautions / Restrictions Precautions Precautions: Fall Precaution/Restrictions Comments: monitor O2 Restrictions Weight Bearing Restrictions Per Provider Order: No     Mobility  Bed Mobility Overal bed mobility: Modified Independent             General bed mobility comments: Pt self able to transition to EOB but then required a rest/breathing break.    Transfers Overall transfer level: Needs assistance Equipment used: Rolling walker (2 wheels), None Transfers: Sit to/from Stand Sit to Stand: Supervision, Contact guard assist           General transfer comment: Pt physically able to rise with good use of hands to steady self.  Pt required an extended rest break after using BSC before attempting amb due to 3/4 dyspnea.    Ambulation/Gait Ambulation/Gait assistance: Contact guard assist Gait Distance (Feet): 12 Feet Assistive device: Rolling walker (2 wheels) Gait Pattern/deviations: Step-through pattern, Decreased stride length Gait velocity: decreased     General Gait Details: limited amb distance 12 feet on 2 lts with sats 96% and HR 87 and dyspnea 3/4.  Max VC's on Purse Lip breathing.  Required an extneded rest break.   Stairs             Wheelchair Mobility     Tilt Bed    Modified Rankin (Stroke Patients Only)       Balance  Communication Communication Communication: No apparent difficulties  Cognition Arousal: Alert Behavior During Therapy: WFL for tasks assessed/performed, Anxious   PT - Cognitive impairments: No apparent impairments                       PT - Cognition Comments: AxO x 3 pleasant  Lady who was living home alone.  Hx COPD and heas been on 2 lts.  Requires increased time and instructions to keep her anxiety down. Following commands: Intact      Cueing Cueing Techniques: Verbal cues  Exercises      General Comments        Pertinent Vitals/Pain Pain Assessment Pain Assessment: No/denies pain    Home Living                          Prior Function            PT Goals (current goals can now be found in the care plan section) Progress towards PT goals: Progressing toward goals    Frequency    Min 3X/week      PT Plan      Co-evaluation              AM-PAC PT 6 Clicks Mobility   Outcome Measure  Help needed turning from your back to your side while in a flat bed without using bedrails?: A Little Help needed moving from lying on your back to sitting on the side of a flat bed without using bedrails?: A Little Help needed moving to and from a bed to a chair (including a wheelchair)?: A Little Help needed standing up from a chair using your arms (e.g., wheelchair or bedside chair)?: A Little Help needed to walk in hospital room?: A Lot Help needed climbing 3-5 steps with a railing? : A Lot 6 Click Score: 16    End of Session Equipment Utilized During Treatment: Oxygen;Gait belt Activity Tolerance: Patient limited by fatigue Patient left: in bed;with call bell/phone within reach;with bed alarm set Nurse Communication: Mobility status;Other (comment) PT Visit Diagnosis: History of falling (Z91.81);Unsteadiness on feet (R26.81);Difficulty in walking, not elsewhere classified (R26.2)     Time: 8562-8498 PT Time Calculation (min) (ACUTE ONLY): 24 min  Charges:    $Gait Training: 8-22 mins $Therapeutic Activity: 8-22 mins PT General Charges $$ ACUTE PT VISIT: 1 Visit                     Katheryn Leap  PTA Acute  Rehabilitation Services Office M-F          [phone]

## 2024-10-15 NOTE — TOC Transition Note (Addendum)
 Transition of Care (TOC)    Note:  This should be a progression note.   Patient Details  Name: Shannon Roberts MRN: [id number] Date of Birth: Feb 14, 1951  Transition of Care [facility]) CM/SW Contact:  [name], LCSW Phone Number: 10/15/2024, 11:45 AM   Clinical Narrative:    CSW reached out to daughter after I read a note that she  had been in contact with CIR RN.  Pt not needing CIR, but recommended for SNF.  CSW spoke with daughter Mercy and she and mother would like to pursue rehab first choice.  [facility] is the agency that she is interested in first.  Daughter would like to plan for DC home if that has to happen so requested HH orders as well.  CSW agreed to send bed requests to determine if there are offers and PT recommended SNF.   PASSR and FL2 completed. FL2 sent for co-sign.  ICM following.   Addendum: Daughter called back- [facility] no longer consideration.  Here are their preferred facilities: [facility] or [facility].  Update- sent message to daughter at 3:30 with present offers and advised her I will not be assigned case manager tomorrow:    Barriers to Discharge: Continued Medical Work up   Patient Goals and CMS Choice Patient states their goals for this hospitalization and ongoing recovery are:: SNF CMS Medicare.gov Compare Post Acute Care list provided to:: Patient Represenative (must comment) Choice offered to / list presented to : Adult Children [place] ownership interest in [facility].provided to:: Patient    Discharge Placement                       Discharge Plan and Services Additional resources added to the After Visit Summary for   In-house Referral: Clinical Social Work   Post Acute Care Choice: Resumption of Svcs/PTA Provider          DME Arranged: N/A DME Agency: NA                  Social Drivers of Health (SDOH) Interventions SDOH Screenings   Food Insecurity: No Food  Insecurity (10/11/2024)  Housing: Low Risk (10/11/2024)  Transportation Needs: No Transportation Needs (10/11/2024)  Utilities: Not At Risk (10/11/2024)  Social Connections: Socially Isolated (10/11/2024)  Tobacco Use: Medium Risk (10/10/2024)     Readmission Risk Interventions    10/11/2024    2:25 PM 10/07/2024    5:40 PM 10/05/2024    9:23 AM  Readmission Risk Prevention Plan  Transportation Screening Complete Complete Complete  PCP or Specialist Appt within 5-7 Days  Complete Complete  Home Care Screening  Complete Complete  Medication Review (RN CM)  Complete Complete  HRI or Home Care Consult Complete    Social Work Consult for Recovery Care Planning/Counseling Complete    Palliative Care Screening Not Applicable    Medication Review Ocean[employer]) Complete

## 2024-10-15 NOTE — Progress Notes (Signed)
" °   10/15/24 1500  Spiritual Encounters  Type of Visit Follow up  Care provided to: Pt and family  Reason for visit Advance directives  OnCall Visit No    Chaplain responded to phone call from pt's daughter Mercy requesting advance directive paperwork. She expressed some frustration, stating that they've been wanting to complete this for the last week or so.  Paperwork and information provided and all questions answered at this time. I explained that we can oftentimes facilitate notarization of the documents here in the hospital, but that it can sometimes be difficult based on notary/witness availability. Pt Shannon Roberts and Shannon Roberts expressed their understanding, and also their desire to be able to complete this by Monday. 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  Chastidy requested prayer for health and happiness, which I gladly provided. Nena and Shannon Roberts expressed gratitude for chaplain presence, describing the feeling after the prayer as a weight lifted. Chaplains continue to remain available. "

## 2024-10-15 NOTE — [place] FL2 (Signed)
 " [place]  MEDICAID FL2 LEVEL OF CARE FORM     IDENTIFICATION  Patient Name: Shannon Roberts Birthdate: 05/22/1951 Sex: female Admission Date (Current Location): 10/10/2024  [facility] and [place] Number:  Producer, Television/film/video and Address:  [facility],  [address] [place], [place] [address]      Provider Number: [id number]  Attending Physician Name and Address:  Austria, Eric J, DO  Relative Name and Phone Number:  Isella, Slatten  Daughter, Emergency Contact  [phone] (Mobile)    Current Level of Care: Hospital Recommended Level of Care: Skilled Nursing Facility Prior Approval Number:    Date Approved/Denied:   PASRR Number: [id number]  Discharge Plan: SNF    Current Diagnoses: Patient Active Problem List   Diagnosis Date Noted   COPD exacerbation (HCC) 10/10/2024   Leg edema, right 10/10/2024   CAD (coronary artery disease) 10/08/2024   Ambulatory dysfunction 10/07/2024   COVID-19 virus infection 10/06/2024   Class 1 obesity due to excess calories with body mass index (BMI) of 31.0 to 31.9 in adult 10/06/2024   Hyponatremia 10/03/2024   Essential hypertension 10/03/2024   Hypothyroidism 10/03/2024   Agatston coronary artery calcium  score greater than 400 03/14/2022   Elevated LFTs 07/27/2021   Pre-diabetes 08/03/2019   Abnormal urine 08/03/2019   Cyst of ovary 08/03/2019   Endometriosis of the cul-de-sac 08/03/2019   Female bladder prolapse 08/03/2019   Pelvic mass 08/03/2019   Pneumatouria 08/03/2019   Chronic kidney disease, stage 3a (HCC) 08/03/2019   Anemia 07/09/2019   Chronic constipation 07/09/2019   Chronic allergic rhinitis 07/30/2017   RBBB 01/31/2017   History of CVA (cerebrovascular accident) 01/31/2017   Anemia due to chronic kidney disease 01/31/2017   Occlusion and stenosis of carotid artery without mention of cerebral infarction 02/08/2014   Carotid stenosis 01/17/2014   Cerebral infarction (HCC) 01/03/2014    Stroke (HCC) 01/02/2014   Tobacco abuse 10/07/2011   HTN (hypertension), malignant 10/07/2011   Hyperlipidemia 10/07/2011   Anxiety disorder 10/07/2011   GERD (gastroesophageal reflux disease) 05/13/2011   Irritable bowel syndrome 05/13/2011   COPD, severe (HCC) 05/13/2011    Orientation RESPIRATION BLADDER Height & Weight     Self, Time  O2 (2LPM) Continent Weight: 182 lb 5.1 oz (82.7 kg) Height:  5' 5 (165.1 cm)  BEHAVIORAL SYMPTOMS/MOOD NEUROLOGICAL BOWEL NUTRITION STATUS      Continent Diet  AMBULATORY STATUS COMMUNICATION OF NEEDS Skin   Limited Assist Verbally Normal, Other (Comment) (Ecchymosis)                       Personal Care Assistance Level of Assistance  Bathing, Feeding, Dressing Bathing Assistance: Limited assistance Feeding assistance: Limited assistance Dressing Assistance: Limited assistance     Functional Limitations Info  Sight Sight Info: Impaired        SPECIAL CARE FACTORS FREQUENCY  PT (By licensed PT), OT (By licensed OT)     PT Frequency: 5X Week OT Frequency: 5X Week            Contractures      Additional Factors Info  Code Status, Allergies Code Status Info: Full Allergies Info: Azithromycin , Codeine , Carvedilol, Dapsone, Nsaids, Septra (Sulfamethoxazole-trimethoprim), Amoxicillin, Niacin And Related, Paroxetine Hcl, Sertraline Hcl, Wellbutrin (Bupropion)           Current Medications (10/15/2024):  This is the current hospital active medication list Current Facility-Administered Medications  Medication Dose Route Frequency Provider Last Rate Last Admin   acetaminophen  (  TYLENOL ) tablet 650 mg  650 mg Oral Q6H PRN Doutova, Anastassia, MD   650 mg at 10/14/24 1456   Or   acetaminophen  (TYLENOL ) suppository 650 mg  650 mg Rectal Q6H PRN Doutova, Anastassia, MD       albuterol  (PROVENTIL ) (2.5 MG/3ML) 0.083% nebulizer solution 2.5 mg  2.5 mg Nebulization Q2H PRN Doutova, Anastassia, MD       amLODipine  (NORVASC ) tablet 5  mg  5 mg Oral Daily Austria, Eric J, DO   5 mg at 10/15/24 9156   atenolol  (TENORMIN ) tablet 50 mg  50 mg Oral Daily Doutova, Anastassia, MD   50 mg at 10/15/24 0843   bisacodyl  (DULCOLAX) suppository 10 mg  10 mg Rectal Daily PRN [place], Eric J, DO   10 mg at 10/14/24 2205   budesonide -glycopyrrolate -formoterol  (BREZTRI ) 160-9-4.8 MCG/ACT inhaler 2 puff  2 puff Inhalation BID Doutova, Anastassia, MD   2 puff at 10/15/24 0846   clopidogrel  (PLAVIX ) tablet 75 mg  75 mg Oral Daily Doutova, Anastassia, MD   75 mg at 10/15/24 0843   doxycycline  (VIBRA -TABS) tablet 100 mg  100 mg Oral Q12H Austria, Eric J, DO   100 mg at 10/15/24 9156   DULoxetine  (CYMBALTA ) DR capsule 60 mg  60 mg Oral q AM Doutova, Anastassia, MD   60 mg at 10/15/24 0843   enoxaparin  (LOVENOX ) injection 40 mg  40 mg Subcutaneous Q24H Pham, Anh P, RPH   40 mg at 10/14/24 1240   feeding supplement (ENSURE PLUS HIGH PROTEIN) liquid 237 mL  237 mL Oral BID BM Austria, Eric J, DO   237 mL at 10/15/24 9153   fentaNYL  (SUBLIMAZE ) injection 12.5-50 mcg  12.5-50 mcg Intravenous Q2H PRN Doutova, Anastassia, MD       ferrous sulfate  tablet 325 mg  325 mg Oral Q breakfast Doutova, Anastassia, MD   325 mg at 10/15/24 9156   guaiFENesin  (MUCINEX ) 12 hr tablet 600 mg  600 mg Oral BID PRN Doutova, Anastassia, MD       HYDROcodone -acetaminophen  (NORCO/VICODIN) 5-325 MG per tablet 1-2 tablet  1-2 tablet Oral Q4H PRN Doutova, Anastassia, MD   1 tablet at 10/11/24 2126   ipratropium-albuterol  (DUONEB) 0.5-2.5 (3) MG/3ML nebulizer solution 3 mL  3 mL Nebulization TID Austria, Eric J, DO   3 mL at 10/15/24 1002   isosorbide  mononitrate (IMDUR ) 24 hr tablet 30 mg  30 mg Oral BID Austria, Eric J, DO   30 mg at 10/15/24 9156   levothyroxine  (SYNTHROID ) tablet 50 mcg  50 mcg Oral QAC breakfast Doutova, Anastassia, MD   50 mcg at 10/15/24 0700   LORazepam  (ATIVAN ) tablet 0.5-0.75 mg  0.5-0.75 mg Oral Q6H PRN Doutova, Anastassia, MD   0.75 mg at 10/14/24 2153    melatonin tablet 3 mg  3 mg Oral QHS Austria, Eric J, DO   3 mg at 10/14/24 2151   multivitamin with minerals tablet 1 tablet  1 tablet Oral Daily Austria, Eric J, DO   1 tablet at 10/15/24 9156   ondansetron  (ZOFRAN ) tablet 4 mg  4 mg Oral Q6H PRN Doutova, Anastassia, MD       Or   ondansetron  (ZOFRAN ) injection 4 mg  4 mg Intravenous Q6H PRN Doutova, Anastassia, MD       pantoprazole  (PROTONIX ) EC tablet 40 mg  40 mg Oral QAC breakfast Doutova, Anastassia, MD   40 mg at 10/15/24 0843   polyethylene glycol (MIRALAX  / GLYCOLAX ) packet 17 g  17 g Oral Daily  Doutova, Anastassia, MD   17 g at 10/15/24 0845   senna-docusate (Senokot-S) tablet 1 tablet  1 tablet Oral BID Austria, Eric J, DO   1 tablet at 10/15/24 0842   simvastatin  (ZOCOR ) tablet 40 mg  40 mg Oral QHS Doutova, Anastassia, MD   40 mg at 10/14/24 2152   sodium chloride  flush (NS) 0.9 % injection 3 mL  3 mL Intravenous Q12H Doutova, Anastassia, MD   3 mL at 10/15/24 0846   sodium chloride  flush (NS) 0.9 % injection 3 mL  3 mL Intravenous PRN Doutova, Anastassia, MD   3 mL at 10/13/24 0934   sodium chloride  tablet 2 g  2 g Oral TID WC Doutova, Anastassia, MD   2 g at 10/15/24 9157     Discharge Medications: Please see discharge summary for a list of discharge medications.  Relevant Imaging Results:  Relevant Lab Results:   Additional Information [id number]  [name], LCSW     "

## 2024-10-16 ENCOUNTER — Inpatient Hospital Stay (HOSPITAL_COMMUNITY)

## 2024-10-16 DIAGNOSIS — J9601 Acute respiratory failure with hypoxia: Secondary | ICD-10-CM | POA: Diagnosis not present

## 2024-10-16 DIAGNOSIS — Z8616 Personal history of COVID-19: Secondary | ICD-10-CM | POA: Diagnosis not present

## 2024-10-16 DIAGNOSIS — Z7951 Long term (current) use of inhaled steroids: Secondary | ICD-10-CM

## 2024-10-16 DIAGNOSIS — J441 Chronic obstructive pulmonary disease with (acute) exacerbation: Secondary | ICD-10-CM | POA: Diagnosis not present

## 2024-10-16 MED ORDER — IOHEXOL 350 MG/ML SOLN
75.0000 mL | Freq: Once | INTRAVENOUS | Status: AC | PRN
  Administered 2024-10-16: 75 mL via INTRAVENOUS

## 2024-10-16 NOTE — Consult Note (Signed)
 "  NAME:  Shannon Roberts, MRN:  [id number], DOB:  [birth year], LOS: 6 ADMISSION DATE:  10/10/2024, CONSULTATION DATE:  10/16/24 REFERRING MD:  TR[facility] CHIEF COMPLAINT:  COPD exacerbation   History of Present Illness:  35F with history of HTN, HLD, CKD stage IIIa, hypothyroidism, COPD, SIADH, obesity, anxiety, recent COVID-19 viral infection who presented to [facility] ED on 10/10/2024 via EMS from home with complaints of progressive shortness of breath, worse with exertion. She was admitted for COPD exacerbation. She has been evaluated by PT and SNF has been recommended. CTA Chest done today to evaluate for PE and it was negative for PE.   She is being treated with doxycycline  and steroids along with breztri  inhaler and as needed nebulizer treatments. She reports feeling better today, just very weak overall.   PCCM consulted for COPD exacerbation.  Pertinent  Medical History   Past Medical History:  Diagnosis Date   Allergic rhinitis    Anemia    per hosp. - recently- pt. told that she is anemic   Anxiety    Colitis    COPD (chronic obstructive pulmonary disease) (HCC)    no o2   Depression    Diverticulosis    Female bladder prolapse    Fibroids    GERD (gastroesophageal reflux disease)    Heart murmur    Hx of adenomatous colonic polyps 08/26/2019   Hyperlipemia    Hypertension    IBS (irritable bowel syndrome)    Insomnia    Lip abscess 04/05/2016   Mitral valve prolapse    Peripheral vascular disease    Pre-diabetes 08/03/2019   Diet controlled     Right bundle branch block    Sciatic nerve disease    Shortness of breath    Stroke [facility]) March 2015    Significant Hospital Events: Including procedures, antibiotic start and stop dates in addition to other pertinent events     Interim History / Subjective:  As above  Objective    Blood pressure 137/64, pulse 75, temperature 97.7 F (36.5 C), temperature source Oral, resp. rate 18, height 5' 5 (1.651 m),  weight 82.7 kg, SpO2 97%.        Intake/Output Summary (Last 24 hours) at 10/16/2024 1628 Last data filed at 10/16/2024 9760 Gross per 24 hour  Intake --  Output 850 ml  Net -850 ml   Filed Weights   10/10/24 1736  Weight: 82.7 kg    Examination: General: elderly woman, no distress HENT: [place]/AT, moist mucous membranes Lungs: clear to auscultation, diminished air movement Cardiovascular: rrr, no murmurs Abdomen: soft, non-distended, non-tender Extremities: warm, no edema Neuro: alert, oriented, moving all extremities GU: n/a  Resolved problem list   Assessment and Plan  Acute Hypoxemic respiratory Failure COPD Exacerbation Recent Covid 19 Infection Deconditioning  - continue breztri  inhaler 2 puffs twice daily - PRN duonebs - complete doxycycline  and steroid courses - agree with plan to discharge to SNF as phase 1 of her rehab plans and then after follow up in pulmonary clinic will arrange for her to go to pulmonary rehab as phase 2.  PCCM will continue to follow   Labs   CBC: Recent Labs  Lab 10/10/24 1838 10/10/24 2021 10/11/24 0658  WBC 6.2  --  6.2  NEUTROABS 4.5  --   --   HGB 10.5* 10.5* 10.4*  HCT 32.0* 31.0* 30.9*  MCV 95.2  --  92.8  PLT 161  --  257  Basic Metabolic Panel: Recent Labs  Lab 10/10/24 1838 10/10/24 2021 10/11/24 0020 10/11/24 0658 10/12/24 0410 10/15/24 0337  NA 143 136 135 136 138 134*  K 2.6* 4.0 4.4 4.7 5.0 4.6  CL 120* 101 100 100 104 99  CO2 14*  --  22 23 25 25   GLUCOSE 83 121* 164* 154* 180* 117*  BUN 14 22 21 21  24* 17  CREATININE 0.58 1.10* 1.10* 1.03* 1.12* 1.00  CALCIUM  6.1*  --  10.1 10.2 10.0 9.8  MG  --   --  2.4 2.3  --   --   PHOS  --   --   --  4.0  --   --    GFR: Estimated Creatinine Clearance: 52.4 mL/min (by C-G formula based on SCr of 1 mg/dL). Recent Labs  Lab 10/10/24 1838 10/11/24 0658  WBC 6.2 6.2    Liver Function Tests: Recent Labs  Lab 10/11/24 0020 10/11/24 0658  AST 23 20   ALT 28 24  ALKPHOS 61 59  BILITOT 0.5 0.4  PROT 7.0 6.7  ALBUMIN  3.9 3.8   No results for input(s): LIPASE, AMYLASE in the last 168 hours. No results for input(s): AMMONIA in the last 168 hours.  ABG    Component Value Date/Time   HCO3 23.6 10/11/2024 0020   TCO2 21 (L) 10/10/2024 2021   ACIDBASEDEF 1.2 10/11/2024 0020   O2SAT 84.7 10/11/2024 0020     Coagulation Profile: No results for input(s): INR, PROTIME in the last 168 hours.  Cardiac Enzymes: No results for input(s): CKTOTAL, CKMB, CKMBINDEX, TROPONINI in the last 168 hours.  HbA1C: Hgb A1c MFr Bld  Date/Time Value Ref Range Status  01/02/2014 10:55 PM 6.0 (H) <5.7 % Final    Comment:    (NOTE)                                                                       According to the ADA Clinical Practice Recommendations for 2011, when HbA1c is used as a screening test:  >=6.5%   Diagnostic of Diabetes Mellitus           (if abnormal result is confirmed) 5.7-6.4%   Increased risk of developing Diabetes Mellitus References:Diagnosis and Classification of Diabetes Mellitus,Diabetes Care,2011,34(Suppl 1):S62-S69 and Standards of Medical Care in         Diabetes - 2011,Diabetes Care,2011,34 (Suppl 1):S11-S61.    CBG: No results for input(s): GLUCAP in the last 168 hours.  Review of Systems:   Review of Systems  Constitutional:  Negative for chills, fever, malaise/fatigue and weight loss.  HENT:  Negative for congestion, sinus pain and sore throat.   Eyes: Negative.   Respiratory:  Positive for shortness of breath. Negative for cough, hemoptysis, sputum production and wheezing.   Cardiovascular:  Negative for chest pain, palpitations, orthopnea, claudication and leg swelling.  Gastrointestinal:  Negative for abdominal pain, heartburn, nausea and vomiting.  Genitourinary: Negative.   Musculoskeletal:  Negative for joint pain and myalgias.  Skin:  Negative for rash.  Neurological:  Positive for  weakness.  Endo/Heme/Allergies: Negative.   Psychiatric/Behavioral: Negative.       Past Medical History:  She,  has a past medical history of Allergic rhinitis, Anemia, Anxiety,  Colitis, COPD (chronic obstructive pulmonary disease) (HCC), Depression, Diverticulosis, Female bladder prolapse, Fibroids, GERD (gastroesophageal reflux disease), Heart murmur, adenomatous colonic polyps (08/26/2019), Hyperlipemia, Hypertension, IBS (irritable bowel syndrome), Insomnia, Lip abscess (04/05/2016), Mitral valve prolapse, Peripheral vascular disease, Pre-diabetes (08/03/2019), Right bundle branch block, Sciatic nerve disease, Shortness of breath, and Stroke [facility]) (March 2015).   Surgical History:   Past Surgical History:  Procedure Laterality Date   CAROTID ENDARTERECTOMY Right January 17, 2014   CE   CHOLECYSTECTOMY     COLONOSCOPY     ENDARTERECTOMY Right 01/17/2014   Procedure: RIGHT CAROTID ARTERY ENDARTERECTOMY WITH DACRON PATCH ANGIOPLASTY;  Surgeon: Krystal JULIANNA Doing, MD;  Location: [facility] OR;  Service: Vascular;  Laterality: Right;   UPPER GASTROINTESTINAL ENDOSCOPY       Social History:   reports that she quit smoking about 10 years ago. Her smoking use included cigarettes. She started smoking about 57 years ago. She has a 70.6 pack-year smoking history. She has never used smokeless tobacco. She reports that she does not drink alcohol and does not use drugs.   Family History:  Her family history includes AAA (abdominal aortic aneurysm) in her mother; Breast cancer in her maternal aunt and mother; Colon cancer in her paternal aunt; Diabetes in her maternal aunt and paternal grandfather; Emphysema in her mother and paternal aunt; Heart disease in her paternal grandfather; Kidney disease in her maternal aunt; Lung cancer in her father; Ovarian cancer in her mother; Pancreatic cancer in her maternal grandmother. There is no history of Colon polyps, Esophageal cancer, Rectal cancer, or Stomach cancer.    Allergies Allergies[1]   Home Medications  Prior to Admission medications  Medication Sig Start Date End Date Taking? Authorizing Provider  acetaminophen  (TYLENOL ) 325 MG tablet Take 2 tablets (650 mg total) by mouth every 6 (six) hours as needed for mild pain (pain score 1-3) or fever (or Fever >/= 101). 10/08/24  Yes Barbarann Nest, MD  albuterol  (PROVENTIL ) (2.5 MG/3ML) 0.083% nebulizer solution Take 3 mLs (2.5 mg total) by nebulization every 6 (six) hours as needed for wheezing or shortness of breath. 07/01/24  Yes Kara Dorn NOVAK, MD  amLODipine  (NORVASC ) 5 MG tablet Take 5 mg by mouth daily. 03/11/21  Yes [provider]  atenolol  (TENORMIN ) 50 MG tablet Take 50 mg by mouth daily.   Yes [provider]  Cholecalciferol (VITAMIN D3) 5000 units CAPS Take 5,000 Units by mouth See admin instructions. Take 5,000 units by mouth daily except on Saturdays and Sundays   Yes [provider]  clopidogrel  (PLAVIX ) 75 MG tablet Take 75 mg by mouth daily.    Yes [provider]  DULoxetine  (CYMBALTA ) 30 MG capsule Take 60 mg by mouth in the morning.   Yes [provider]  Evolocumab  (REPATHA  SURECLICK) 140 MG/ML SOAJ Inject 140 mg into the skin every 14 (fourteen) days. INJECT UNDER THE SKIN EVERY 14 DAYS. Patient taking differently: Inject 140 mg into the skin every 14 (fourteen) days. 06/08/24  Yes O'Neal, Darryle Ned, MD  ferrous sulfate  325 (65 FE) MG tablet Take 325 mg by mouth daily with breakfast.   Yes [provider]  fluticasone  (FLONASE ) 50 MCG/ACT nasal spray Place 1 spray into both nostrils 2 (two) times daily as needed for allergies or rhinitis.   Yes [provider]  Fluticasone -Umeclidin-Vilant (TRELEGY ELLIPTA ) 200-62.5-25 MCG/ACT AEPB Inhale 1 puff into the lungs daily. 04/22/24  Yes Hope Almarie ORN, NP  Ipratropium-Albuterol  (COMBIVENT  RESPIMAT) 20-100 MCG/ACT AERS respimat  Inhale 1 puff into the lungs every 4  (four) hours as needed for wheezing or shortness of breath. 08/07/21  Yes Kara Dorn NOVAK, MD  isosorbide  mononitrate (IMDUR ) 30 MG 24 hr tablet Take 30 mg by mouth in the morning and at bedtime. 09/24/14  Yes [provider]  levothyroxine  (SYNTHROID ) 50 MCG tablet Take 50 mcg by mouth daily before breakfast.   Yes [provider]  LORazepam  (ATIVAN ) 0.5 MG tablet Take 0.5-0.75 mg by mouth every 6 (six) hours as needed for anxiety.   Yes [provider]  MUCINEX  600 MG 12 hr tablet Take 600 mg by mouth 2 (two) times daily as needed for to loosen phlegm.   Yes [provider]  ondansetron  (ZOFRAN -ODT) 4 MG disintegrating tablet Take 1 tablet (4 mg total) by mouth every 8 (eight) hours as needed for nausea or vomiting. Patient taking differently: Take 4 mg by mouth every 8 (eight) hours as needed for nausea or vomiting (dissolve orally). 09/29/24  Yes Logan Ubaldo NOVAK, PA-C  pantoprazole  (PROTONIX ) 40 MG tablet Take 40 mg by mouth daily before breakfast.   Yes [provider]  polyethylene glycol powder (GLYCOLAX /MIRALAX ) 17 GM/SCOOP powder Take 17 g by mouth daily. Dissolve 1 capful (17g) in 4-8 ounces of liquid and take by mouth daily. 10/08/24  Yes Barbarann Nest, MD  simvastatin  (ZOCOR ) 20 MG tablet Take 40 mg by mouth at bedtime. 06/06/22  Yes [provider]  [Paused] lisinopril  (PRINIVIL ,ZESTRIL ) 40 MG tablet Take 40 mg by mouth daily. Patient not taking: Reported on 10/11/2024 Wait to take this until your doctor or other care provider tells you to start again.    [provider]     Critical care time: n/a    Dorn Kara, MD [facility] Office: [phone]   See Amion for personal pager PCCM on call pager [phone] until 7pm. Please call Elink 7p-7a. [phone]            [1]  Allergies Allergen Reactions   Azithromycin  Other (See Comments)    Pt states she cannot take  this due to her heart medication and heart condition   Codeine  Shortness Of Breath   Carvedilol Palpitations   Dapsone Other (See Comments)    Flushing    Nsaids Other (See Comments)    Contraindication due to CKD    Septra [Sulfamethoxazole-Trimethoprim] Diarrhea and Nausea And Vomiting   Amoxicillin Rash and Other (See Comments)    FLUSHED FEELING   Niacin And Related Other (See Comments)    Flushing    Paroxetine Hcl Other (See Comments)    Dizziness and dreams   Sertraline Hcl Other (See Comments)    Reported skin crawling and a fuzzy head   Wellbutrin [Bupropion] Anxiety and Other (See Comments)    Caused the patient to feel jittery   "

## 2024-10-16 NOTE — Progress Notes (Signed)
 Relayed daughter's concerns with plan of care to MD. She said she would like spiral CT to check for pulmonary embolism and for pulmonary consult before pt discharges

## 2024-10-16 NOTE — Progress Notes (Signed)
 " PROGRESS NOTE    Shannon Roberts  FMW:[id number] DOB: Mar 19, 1951 DOA: 10/10/2024 PCP: Okey Carlin Redbird, MD    Brief Narrative:   Shannon Roberts is a 73 y.o. female with past medical history significant for HTN, HLD, CKD stage IIIa, hypothyroidism, COPD, SIADH, obesity, anxiety, recent COVID-19 viral infection who presented to [facility] ED on 10/10/2024 via EMS from home with complaints of progressive shortness of breath, worse with exertion.  Denies any sick contacts, no fevers, no chills, no chest pain.  Patient recently admitted for hyponatremia secondary to SIADH in which she was found to have a sodium of 119, patient was started on salt tablets and fluid restriction with improvement of sodium to 130 at time of discharge.  Additionally, patient was also discharged on 2 L nasal cannula related to her COPD and recent COVID infection.  In the ED, temperature 97.9 F, HR 83, RR 19, BP 147/72, SpO2 96% on 2L [place].  WBC 6.2, hemoglobin 10.5, plate count 838.  Sodium 143, potassium 2.6, chloride 120, CO2 14, glucose 83, BUN 14, creatinine 0.58.  Calcium  6.1.  Anion gap 9.  COVID/influenza/RSV PCR negative.  Chest x-ray with no active cardiopulmonary disease process.  Patient was started on IV Solu-Medrol , given IV magnesium  DuoNebs, potassium supplements and albuterol  neb.  TRH consulted for admission for further evaluation and management of COPD exacerbation, concern for community-acquired pneumonia.  Assessment & Plan:   Acute hypoxic respiratory failure, POA COPD exacerbation Recent COVID-19 viral infection Patient presenting with progressive shortness of breath, not oxygen dependent at baseline although was recently discharged on home oxygen from recent Covid-19 viral infection.  Has underlying COPD, follows with pulmonology Dr. Kara outpatient.  Patient is afebrile without leukocytosis.  Chest x-ray with no focal consolidation. -- Doxycycline  100 mg p.o. twice daily x 7 days  (day #6/7) -- Breztri  2 puffs twice daily (substituted for home Trelegy Ellipta ) -- Complete 5-day course of prednisone  today -- DuoNeb every 6 hours scheduled -- Albuterol  neb every 2 hours.  Wheezing/shortness of breath -- Continue supplemental oxygen to maintain SpO2 greater than 88%; currently on 2 L nasal cannula with SpO2 98% at rest  Elevated D-dimer D-dimer elevated 0.99, but normalizes with age adjustment.  Vascular duplex ultrasound right lower extremity negative for DVT.  Patient's daughter requesting further workup with CT angiogram chest. -- CT angiogram chest to rule out PE: Ordered, low suspicion  HTN CAD HLD -- Atenolol  50 mg p.o. daily -- Isosorbide  mononitrate 30 m p.o. daily -- Plavix  75 mg p.o. daily -- Simvastatin  40 mg p.o. daily  CKD stage IIIa -- Cr 1.10>1.12>1.00; stable  Hypothyroidism -- Levothyroxine  50 mcg p.o. daily  SIADH -- Na 135>136>138>134 -- Sodium chloride  2 g p.o. 3 times daily AC -- 1200 mL fluid restriction  GERD -- Protonix  40 mg p.o. daily  Anxiety -- Lorazepam  0.5 - 0.75 mg p.o. every 6 hours as needed anxiety  Obesity, class I Body mass index is 30.34 kg/m.   Weakness/debility/deconditioning: -- PT now recommending SNF placement -- TOC following   DVT prophylaxis: enoxaparin  (LOVENOX ) injection 40 mg Start: 10/12/24 1200 SCDs Start: 10/11/24 0343    Code Status: Full Code Family Communication: No family present at bedside this morning, updated patient's daughter Angie via telephone this morning in patient room  Disposition Plan:  Level of care: Telemetry Status is: Inpatient Remains inpatient appropriate because: Pending SNF placement    Consultants:  None  Procedures:  Vas duplex ultrasound right  lower extremity: Negative for DVT  Antimicrobials:  Doxycycline  12/22>>   Subjective: Patient seen examined bedside, lying in bed.  Remains on 2 L nasal cannula, with SpO2 98% at rest.  Patient reports had a  great day yesterday but had a difficult night.  Daughter updated on speaker phone in patient's room.  Daughter concerned about elevated D-dimer, discussed that this age-adjusted normal for age but she would like pulmonary embolism ruled out, CT angiogram chest ordered.  Also requesting pulmonology evaluation, discussed with patient's primary pulmonologist, Dr. Kara this morning.   No other questions or concerns at this time.  Denies headache, no dizziness, no chest pain, no palpitations, no fever/chills/night sweats, no nausea/vomiting/diarrhea, no focal weakness, no fatigue, no paresthesias.  No acute events overnight per nursing staff.  Objective: Vitals:   10/15/24 2059 10/15/24 2147 10/15/24 2242 10/16/24 0505  BP:  (!) 159/106 (!) 150/79 (!) 159/81  Pulse:  97  88  Resp:    16  Temp:    98.1 F (36.7 C)  TempSrc:    Oral  SpO2: 97%   98%  Weight:      Height:        Intake/Output Summary (Last 24 hours) at 10/16/2024 1245 Last data filed at 10/16/2024 0239 Gross per 24 hour  Intake --  Output 850 ml  Net -850 ml   Filed Weights   10/10/24 1736  Weight: 82.7 kg    Examination:  Physical Exam: GEN: NAD, alert and oriented x 3, obese HEENT: NCAT, PERRL, EOMI, sclera clear, MMM PULM: Breath sounds diminished bilateral bases without wheezes/crackles, normal respiratory effort without accessory muscle use, on 2 L nasal cannula SpO2 99% at rest CV: RRR w/o M/G/R GI: abd soft, NTND, + BS MSK: Trace bilateral lower extremity peripheral edema, moves all extremities independently with preserved muscle strength NEURO: No focal neurological deficit PSYCH: normal mood/affect Integumentary: No concerning rashes/lesions/wounds noted on exposed skin surfaces    Data Reviewed: I have personally reviewed following labs and imaging studies  CBC: Recent Labs  Lab 10/10/24 1838 10/10/24 2021 10/11/24 0658  WBC 6.2  --  6.2  NEUTROABS 4.5  --   --   HGB 10.5* 10.5* 10.4*  HCT  32.0* 31.0* 30.9*  MCV 95.2  --  92.8  PLT 161  --  257   Basic Metabolic Panel: Recent Labs  Lab 10/10/24 1838 10/10/24 2021 10/11/24 0020 10/11/24 0658 10/12/24 0410 10/15/24 0337  NA 143 136 135 136 138 134*  K 2.6* 4.0 4.4 4.7 5.0 4.6  CL 120* 101 100 100 104 99  CO2 14*  --  22 23 25 25   GLUCOSE 83 121* 164* 154* 180* 117*  BUN 14 22 21 21  24* 17  CREATININE 0.58 1.10* 1.10* 1.03* 1.12* 1.00  CALCIUM  6.1*  --  10.1 10.2 10.0 9.8  MG  --   --  2.4 2.3  --   --   PHOS  --   --   --  4.0  --   --    GFR: Estimated Creatinine Clearance: 52.4 mL/min (by C-G formula based on SCr of 1 mg/dL). Liver Function Tests: Recent Labs  Lab 10/11/24 0020 10/11/24 0658  AST 23 20  ALT 28 24  ALKPHOS 61 59  BILITOT 0.5 0.4  PROT 7.0 6.7  ALBUMIN  3.9 3.8   No results for input(s): LIPASE, AMYLASE in the last 168 hours. No results for input(s): AMMONIA in the last 168 hours. Coagulation Profile:  No results for input(s): INR, PROTIME in the last 168 hours. Cardiac Enzymes: No results for input(s): CKTOTAL, CKMB, CKMBINDEX, TROPONINI in the last 168 hours. BNP (last 3 results) Recent Labs    10/03/24 1444  PROBNP 532.0*   HbA1C: No results for input(s): HGBA1C in the last 72 hours. CBG: No results for input(s): GLUCAP in the last 168 hours. Lipid Profile: No results for input(s): CHOL, HDL, LDLCALC, TRIG, CHOLHDL, LDLDIRECT in the last 72 hours. Thyroid  Function Tests: No results for input(s): TSH, T4TOTAL, FREET4, T3FREE, THYROIDAB in the last 72 hours. Anemia Panel: No results for input(s): VITAMINB12, FOLATE, FERRITIN, TIBC, IRON, RETICCTPCT in the last 72 hours. Sepsis Labs: No results for input(s): PROCALCITON, LATICACIDVEN in the last 168 hours.  Recent Results (from the past 240 hours)  Resp panel by RT-PCR (RSV, Flu A&B, Covid) Anterior Nasal Swab     Status: None   Collection Time: 10/10/24  6:35 PM    Specimen: Anterior Nasal Swab  Result Value Ref Range Status   SARS Coronavirus 2 by RT PCR NEGATIVE NEGATIVE Final    Comment: (NOTE) SARS-CoV-2 target nucleic acids are NOT DETECTED.  The SARS-CoV-2 RNA is generally detectable in upper respiratory specimens during the acute phase of infection. The lowest concentration of SARS-CoV-2 viral copies this assay can detect is 138 copies/mL. A negative result does not preclude SARS-Cov-2 infection and should not be used as the sole basis for treatment or other patient management decisions. A negative result may occur with  improper specimen collection/handling, submission of specimen other than nasopharyngeal swab, presence of viral mutation(s) within the areas targeted by this assay, and inadequate number of viral copies(<138 copies/mL). A negative result must be combined with clinical observations, patient history, and epidemiological information. The expected result is Negative.  Fact Sheet for Patients:  bloggercourse.com  Fact Sheet for Healthcare Providers:  seriousbroker.it  This test is no t yet approved or cleared by the [place]  FDA and  has been authorized for detection and/or diagnosis of SARS-CoV-2 by FDA under an Emergency Use Authorization (EUA). This EUA will remain  in effect (meaning this test can be used) for the duration of the COVID-19 declaration under Section 564(b)(1) of the Act, 21 U.S.C.section 360bbb-3(b)(1), unless the authorization is terminated  or revoked sooner.       Influenza A by PCR NEGATIVE NEGATIVE Final   Influenza B by PCR NEGATIVE NEGATIVE Final    Comment: (NOTE) The Xpert Xpress SARS-CoV-2/FLU/RSV plus assay is intended as an aid in the diagnosis of influenza from Nasopharyngeal swab specimens and should not be used as a sole basis for treatment. Nasal washings and aspirates are unacceptable for Xpert Xpress  SARS-CoV-2/FLU/RSV testing.  Fact Sheet for Patients: bloggercourse.com  Fact Sheet for Healthcare Providers: seriousbroker.it  This test is not yet approved or cleared by the [place]  FDA and has been authorized for detection and/or diagnosis of SARS-CoV-2 by FDA under an Emergency Use Authorization (EUA). This EUA will remain in effect (meaning this test can be used) for the duration of the COVID-19 declaration under Section 564(b)(1) of the Act, 21 U.S.C. section 360bbb-3(b)(1), unless the authorization is terminated or revoked.     Resp Syncytial Virus by PCR NEGATIVE NEGATIVE Final    Comment: (NOTE) Fact Sheet for Patients: bloggercourse.com  Fact Sheet for Healthcare Providers: seriousbroker.it  This test is not yet approved or cleared by the [place]  FDA and has been authorized for detection and/or diagnosis of SARS-CoV-2 by FDA  under an Emergency Use Authorization (EUA). This EUA will remain in effect (meaning this test can be used) for the duration of the COVID-19 declaration under Section 564(b)(1) of the Act, 21 U.S.C. section 360bbb-3(b)(1), unless the authorization is terminated or revoked.  Performed at [facility], [address] [place], [place] [address]          Radiology Studies: No results found.       Scheduled Meds:  amLODipine   5 mg Oral Daily   atenolol   50 mg Oral Daily   budesonide -glycopyrrolate -formoterol   2 puff Inhalation BID   clopidogrel   75 mg Oral Daily   doxycycline   100 mg Oral Q12H   DULoxetine   60 mg Oral q AM   enoxaparin  (LOVENOX ) injection  40 mg Subcutaneous Q24H   feeding supplement  237 mL Oral BID BM   ferrous sulfate   325 mg Oral Q breakfast   ipratropium-albuterol   3 mL Nebulization TID   isosorbide  mononitrate  30 mg Oral BID   levothyroxine   50 mcg Oral QAC breakfast    melatonin  3 mg Oral QHS   multivitamin with minerals  1 tablet Oral Daily   pantoprazole   40 mg Oral QAC breakfast   polyethylene glycol  17 g Oral Daily   senna-docusate  1 tablet Oral BID   simvastatin   40 mg Oral QHS   sodium chloride  flush  3 mL Intravenous Q12H   sodium chloride   2 g Oral TID WC   Continuous Infusions:     LOS: 6 days    Time spent: 46 minutes spent on 10/16/2024 caring for this patient face-to-face including chart review, ordering labs/tests, documenting, discussion with nursing staff, consultants, updating family and interview/physical exam    Camellia PARAS Gregory Dowe, DO [facility] Available via Epic secure chat 7am-7pm After these hours, please refer to coverage provider listed on [email or web address] 10/16/2024, 12:45 PM   "

## 2024-10-16 NOTE — Plan of Care (Signed)
  Problem: Clinical Measurements: Goal: Diagnostic test results will improve Outcome: Progressing Goal: Respiratory complications will improve Outcome: Progressing Goal: Cardiovascular complication will be avoided Outcome: Progressing   Problem: Activity: Goal: Risk for activity intolerance will decrease Outcome: Progressing

## 2024-10-17 DIAGNOSIS — Z8616 Personal history of COVID-19: Secondary | ICD-10-CM | POA: Diagnosis not present

## 2024-10-17 DIAGNOSIS — Z7951 Long term (current) use of inhaled steroids: Secondary | ICD-10-CM | POA: Diagnosis not present

## 2024-10-17 DIAGNOSIS — J441 Chronic obstructive pulmonary disease with (acute) exacerbation: Secondary | ICD-10-CM | POA: Diagnosis not present

## 2024-10-17 DIAGNOSIS — J9601 Acute respiratory failure with hypoxia: Secondary | ICD-10-CM | POA: Diagnosis not present

## 2024-10-17 LAB — CBC
HCT: 32.7 % — ABNORMAL LOW (ref 36.0–46.0)
Hemoglobin: 10.7 g/dL — ABNORMAL LOW (ref 12.0–15.0)
MCH: 30.7 pg (ref 26.0–34.0)
MCHC: 32.7 g/dL (ref 30.0–36.0)
MCV: 93.7 fL (ref 80.0–100.0)
Platelets: 295 K/uL (ref 150–400)
RBC: 3.49 MIL/uL — ABNORMAL LOW (ref 3.87–5.11)
RDW: 12.6 % (ref 11.5–15.5)
WBC: 13.2 K/uL — ABNORMAL HIGH (ref 4.0–10.5)
nRBC: 0 % (ref 0.0–0.2)

## 2024-10-17 LAB — BASIC METABOLIC PANEL WITH GFR
Anion gap: 9 (ref 5–15)
BUN: 19 mg/dL (ref 8–23)
CO2: 24 mmol/L (ref 22–32)
Calcium: 9.2 mg/dL (ref 8.9–10.3)
Chloride: 98 mmol/L (ref 98–111)
Creatinine, Ser: 1.12 mg/dL — ABNORMAL HIGH (ref 0.44–1.00)
GFR, Estimated: 51 mL/min — ABNORMAL LOW
Glucose, Bld: 118 mg/dL — ABNORMAL HIGH (ref 70–99)
Potassium: 4.3 mmol/L (ref 3.5–5.1)
Sodium: 131 mmol/L — ABNORMAL LOW (ref 135–145)

## 2024-10-17 MED ORDER — SODIUM CHLORIDE 3 % IN NEBU
4.0000 mL | INHALATION_SOLUTION | Freq: Every day | RESPIRATORY_TRACT | Status: AC
  Administered 2024-10-17 – 2024-10-19 (×3): 4 mL via RESPIRATORY_TRACT
  Filled 2024-10-17 (×2): qty 4

## 2024-10-17 NOTE — TOC Progression Note (Signed)
 Transition of Care [facility]) - Progression Note    Patient Details  Name: Shannon Roberts MRN: [id number] Date of Birth: 05-Jan-1951  [facility]) CM/SW Contact  [name], LCSW Phone Number: 10/17/2024, 10:12 AM  Clinical Narrative:    CSW spoke with pt's daughter, Shannon Roberts, [phone], via phone call to discuss SNF bed availability. Pt's daughter reports she is still deciding on which bed offer to accept. Pt's daughter requests, referral be sent to [email or web address]. CSW faxed referral to [facility] in the hub. TOC will continue to follow.   Expected Discharge Plan: Skilled Nursing Facility Barriers to Discharge: Continued Medical Work up   Expected Discharge Plan and Services In-house Referral: Clinical Social Work   Post Acute Care Choice: Resumption of Svcs/PTA Provider Living arrangements for the past 2 months: Single Family Home                 DME Arranged: N/A DME Agency: NA                   Social Drivers of Health (SDOH) Interventions SDOH Screenings   Food Insecurity: No Food Insecurity (10/11/2024)  Housing: Low Risk (10/11/2024)  Transportation Needs: No Transportation Needs (10/11/2024)  Utilities: Not At Risk (10/11/2024)  Social Connections: Socially Isolated (10/11/2024)  Tobacco Use: Medium Risk (10/10/2024)    Readmission Risk Interventions    10/11/2024    2:25 PM 10/07/2024    5:40 PM 10/05/2024    9:23 AM  Readmission Risk Prevention Plan  Transportation Screening Complete Complete Complete  PCP or Specialist Appt within 5-7 Days  Complete Complete  Home Care Screening  Complete Complete  Medication Review (RN CM)  Complete Complete  HRI or Home Care Consult Complete    Social Work Consult for Recovery Care Planning/Counseling Complete    Palliative Care Screening Not Applicable    Medication Review [employer]) Complete      Signed: Heather Saltness, MSW, LCSW Clinical Social Worker Inpatient Care  Management 10/17/2024 10:16 AM

## 2024-10-17 NOTE — Plan of Care (Signed)
 Patient reports feeling 80% better.  Problem: Clinical Measurements: Goal: Respiratory complications will improve Outcome: Progressing   Problem: Coping: Goal: Level of anxiety will decrease Outcome: Progressing

## 2024-10-17 NOTE — Consult Note (Signed)
 "  NAME:  Shannon Roberts, MRN:  [id number], DOB:  [birth year], LOS: 7 ADMISSION DATE:  10/10/2024, CONSULTATION DATE:  10/16/24 REFERRING MD:  [facility] CHIEF COMPLAINT:  COPD exacerbation   History of Present Illness:  71F with history of HTN, HLD, CKD stage IIIa, hypothyroidism, COPD, SIADH, obesity, anxiety, recent COVID-19 viral infection who presented to [facility] ED on 10/10/2024 via EMS from home with complaints of progressive shortness of breath, worse with exertion. She was admitted for COPD exacerbation. She has been evaluated by PT and SNF has been recommended. CTA Chest done today to evaluate for PE and it was negative for PE.   She is being treated with doxycycline  and steroids along with breztri  inhaler and as needed nebulizer treatments. She reports feeling better today, just very weak overall.   PCCM consulted for COPD exacerbation.  Pertinent  Medical History   Past Medical History:  Diagnosis Date   Allergic rhinitis    Anemia    per hosp. - recently- pt. told that she is anemic   Anxiety    Colitis    COPD (chronic obstructive pulmonary disease) (HCC)    no o2   Depression    Diverticulosis    Female bladder prolapse    Fibroids    GERD (gastroesophageal reflux disease)    Heart murmur    Hx of adenomatous colonic polyps 08/26/2019   Hyperlipemia    Hypertension    IBS (irritable bowel syndrome)    Insomnia    Lip abscess 04/05/2016   Mitral valve prolapse    Peripheral vascular disease    Pre-diabetes 08/03/2019   Diet controlled     Right bundle branch block    Sciatic nerve disease    Shortness of breath    Stroke [facility]) March 2015    Significant Hospital Events: Including procedures, antibiotic start and stop dates in addition to other pertinent events     Interim History / Subjective:   No acute events overnight Patient continues to feel better  Objective    Blood pressure 107/63, pulse 83, temperature 98.2 F (36.8 C), temperature  source Oral, resp. rate 18, height 5' 5 (1.651 m), weight 82.7 kg, SpO2 98%.        Intake/Output Summary (Last 24 hours) at 10/17/2024 1359 Last data filed at 10/17/2024 9187 Gross per 24 hour  Intake --  Output 950 ml  Net -950 ml   Filed Weights   10/10/24 1736  Weight: 82.7 kg    Examination: General: elderly woman, no distress HENT: [place]/AT, moist mucous membranes Lungs: clear to auscultation, diminished air movement Cardiovascular: rrr, no murmurs Abdomen: soft, non-distended, non-tender Extremities: warm, no edema Neuro: alert, oriented, moving all extremities GU: n/a  Resolved problem list   Assessment and Plan  Acute Hypoxemic respiratory Failure COPD Exacerbation Recent Covid 19 Infection Deconditioning  - continue breztri  inhaler 2 puffs twice daily - PRN duonebs - complete doxycycline  and steroid courses - add hypertonic nebs for airway clearance with flutter valve/IS - agree with plan to discharge to SNF as phase 1 of her rehab plans and then after follow up in pulmonary clinic will arrange for her to go to pulmonary rehab as phase 2.  PCCM will continue to follow   Labs   CBC: Recent Labs  Lab 10/10/24 1838 10/10/24 2021 10/11/24 0658 10/17/24 0359  WBC 6.2  --  6.2 13.2*  NEUTROABS 4.5  --   --   --   HGB 10.5*  10.5* 10.4* 10.7*  HCT 32.0* 31.0* 30.9* 32.7*  MCV 95.2  --  92.8 93.7  PLT 161  --  257 295    Basic Metabolic Panel: Recent Labs  Lab 10/11/24 0020 10/11/24 0658 10/12/24 0410 10/15/24 0337 10/17/24 0359  NA 135 136 138 134* 131*  K 4.4 4.7 5.0 4.6 4.3  CL 100 100 104 99 98  CO2 22 23 25 25 24   GLUCOSE 164* 154* 180* 117* 118*  BUN 21 21 24* 17 19  CREATININE 1.10* 1.03* 1.12* 1.00 1.12*  CALCIUM  10.1 10.2 10.0 9.8 9.2  MG 2.4 2.3  --   --   --   PHOS  --  4.0  --   --   --    GFR: Estimated Creatinine Clearance: 46.8 mL/min (A) (by C-G formula based on SCr of 1.12 mg/dL (H)). Recent Labs  Lab 10/10/24 1838  10/11/24 0658 10/17/24 0359  WBC 6.2 6.2 13.2*    Liver Function Tests: Recent Labs  Lab 10/11/24 0020 10/11/24 0658  AST 23 20  ALT 28 24  ALKPHOS 61 59  BILITOT 0.5 0.4  PROT 7.0 6.7  ALBUMIN  3.9 3.8   No results for input(s): LIPASE, AMYLASE in the last 168 hours. No results for input(s): AMMONIA in the last 168 hours.  ABG    Component Value Date/Time   HCO3 23.6 10/11/2024 0020   TCO2 21 (L) 10/10/2024 2021   ACIDBASEDEF 1.2 10/11/2024 0020   O2SAT 84.7 10/11/2024 0020     Coagulation Profile: No results for input(s): INR, PROTIME in the last 168 hours.  Cardiac Enzymes: No results for input(s): CKTOTAL, CKMB, CKMBINDEX, TROPONINI in the last 168 hours.  HbA1C: Hgb A1c MFr Bld  Date/Time Value Ref Range Status  01/02/2014 10:55 PM 6.0 (H) <5.7 % Final    Comment:    (NOTE)                                                                       According to the ADA Clinical Practice Recommendations for 2011, when HbA1c is used as a screening test:  >=6.5%   Diagnostic of Diabetes Mellitus           (if abnormal result is confirmed) 5.7-6.4%   Increased risk of developing Diabetes Mellitus References:Diagnosis and Classification of Diabetes Mellitus,Diabetes Care,2011,34(Suppl 1):S62-S69 and Standards of Medical Care in         Diabetes - 2011,Diabetes Care,2011,34 (Suppl 1):S11-S61.    CBG: No results for input(s): GLUCAP in the last 168 hours.     Critical care time: n/a    Dorn Chill, MD [facility] Office: [phone]   See Amion for personal pager PCCM on call pager [phone] until 7pm. Please call Elink 7p-7a. [phone]          "

## 2024-10-17 NOTE — Progress Notes (Signed)
 " PROGRESS NOTE  Shannon Roberts  FMW:[id number] DOB: 15-Jul-1951 DOA: 10/10/2024 PCP: Okey Carlin Redbird, MD   Brief Narrative: Shannon Roberts is a 73 y.o. female with past medical history significant for HTN, HLD, CKD stage IIIa, hypothyroidism, COPD, SIADH, obesity, anxiety, recent COVID-19 viral infection who presented to [facility] ED on 10/10/2024 via EMS from home with complaints of progressive shortness of breath, worse with exertion.  COVID/flu/influenza negative.  Chest x-ray did not show any active cardiopulmonary disease.  Patient admitted for the management of COPD exacerbation.  Started on IV steroids, bronchodilators.  PCCM also consulted.  On 2 L of oxygen this morning.  PT recommending SNF on discharge.  TOC following.  Clinically improved now, ready for SNF discharge  Assessment & Plan:  Active Problems:   COPD exacerbation (HCC)   Hyperlipidemia   Anxiety disorder   History of CVA (cerebrovascular accident)   Anemia   Chronic kidney disease, stage 3a (HCC)   Hyponatremia   Hypothyroidism   Class 1 obesity due to excess calories with body mass index (BMI) of 31.0 to 31.9 in adult   CAD (coronary artery disease)   Leg edema, right  Acute hypoxic respiratory failure, POA COPD exacerbation Recent COVID-19 viral infection Patient presenting with progressive shortness of breath, not oxygen dependent at baseline although was recently discharged on home oxygen from recent Covid-19 viral infection.  Has underlying COPD, follows with pulmonology Dr. Kara outpatient.  Patient is afebrile without leukocytosis.  Chest x-ray with no focal consolidation. -- Doxycycline  100 mg p.o. twice daily x 7 days (day #7/7) -- Breztri  2 puffs twice daily (substituted for home Trelegy Ellipta ) -- Complete 5-day course of prednisone   -- DuoNeb every 6 hours scheduled -- Albuterol  neb every 2 hours.  Wheezing/shortness of breath -- Continue supplemental oxygen to maintain SpO2  greater than 88%; currently on 2 L nasal cannula with SpO2 98% at rest -New onset leukocytosis is  most likely from steroids. -Most likely she might need to continued on oxygen supplementation on discharge.   Elevated D-dimer D-dimer elevated 0.99, but normalizes with age adjustment.  Vascular duplex ultrasound right lower extremity negative for DVT.  Patient's daughter requesting further workup with CT angiogram chest. -- CT angiogram chest to rule out PE: no evidence of PE   HTN CAD HLD -- Atenolol  50 mg p.o. daily -- Isosorbide  mononitrate 30 m p.o. daily -- Plavix  75 mg p.o. daily -- Simvastatin  40 mg p.o. daily   CKD stage IIIa -- Cr 1.10>1.12>1.00; stable   Hypothyroidism -- Continue levothyroxine  50 mcg p.o. daily   SIADH -- Na 135>136>138>134 -- Sodium chloride  2 g p.o. 3 times daily AC -- 1200 mL fluid restriction   GERD -- Protonix  40 mg p.o. daily   Anxiety -- Lorazepam  0.5 - 0.75 mg p.o. every 6 hours as needed anxiety   Obesity, class I Body mass index is 30.34 kg/m.    Weakness/debility/deconditioning: -- PT now recommending SNF placement -- TOC following    Nutrition Problem: Increased nutrient needs Etiology: chronic illness (COPD)    DVT prophylaxis:enoxaparin  (LOVENOX ) injection 40 mg Start: 10/12/24 1200 SCDs Start: 10/11/24 0343     Code Status: Full Code  Family Communication: None at the bedside  Patient status:Inpatient  Patient is from :home  Anticipated discharge to:SNF  Estimated DC date:1-2 days   Consultants: PCCM  Procedures:None  Antimicrobials:  Anti-infectives (From admission, onward)    Start     Dose/Rate Route Frequency Ordered  Stop   10/11/24 1000  doxycycline  (VIBRA -TABS) tablet 100 mg        100 mg Oral Every 12 hours 10/11/24 0342 10/18/24 0959       Subjective: Patient seen and examined at bedside today.  Hemodynamically stable.  Sitting in bed.  Feels very comfortable today.  Denies any worsening  shortness of breath or cough.  On 2 L of oxygen.  No wheezing auscultated  Objective: Vitals:   10/16/24 2120 10/16/24 2214 10/17/24 0534 10/17/24 0810  BP: 129/75  107/63   Pulse: (!) 47  83   Resp: 18  18   Temp: 97.6 F (36.4 C)  98.2 F (36.8 C)   TempSrc: Oral  Oral   SpO2: 97% 97% 98% 98%  Weight:      Height:        Intake/Output Summary (Last 24 hours) at 10/17/2024 0949 Last data filed at 10/16/2024 1830 Gross per 24 hour  Intake --  Output 600 ml  Net -600 ml   Filed Weights   10/10/24 1736  Weight: 82.7 kg    Examination:  General exam: Overall comfortable, not in distress HEENT: PERRL Respiratory system:  no wheezes or crackles, mildly diminished sounds bilaterally Cardiovascular system: S1 & S2 heard, RRR.  Gastrointestinal system: Abdomen is nondistended, soft and nontender. Central nervous system: Alert and oriented Extremities: No edema, no clubbing ,no cyanosis Skin: No rashes, no ulcers,no icterus     Data Reviewed: I have personally reviewed following labs and imaging studies  CBC: Recent Labs  Lab 10/10/24 1838 10/10/24 2021 10/11/24 0658 10/17/24 0359  WBC 6.2  --  6.2 13.2*  NEUTROABS 4.5  --   --   --   HGB 10.5* 10.5* 10.4* 10.7*  HCT 32.0* 31.0* 30.9* 32.7*  MCV 95.2  --  92.8 93.7  PLT 161  --  257 295   Basic Metabolic Panel: Recent Labs  Lab 10/11/24 0020 10/11/24 0658 10/12/24 0410 10/15/24 0337 10/17/24 0359  NA 135 136 138 134* 131*  K 4.4 4.7 5.0 4.6 4.3  CL 100 100 104 99 98  CO2 22 23 25 25 24   GLUCOSE 164* 154* 180* 117* 118*  BUN 21 21 24* 17 19  CREATININE 1.10* 1.03* 1.12* 1.00 1.12*  CALCIUM  10.1 10.2 10.0 9.8 9.2  MG 2.4 2.3  --   --   --   PHOS  --  4.0  --   --   --      Recent Results (from the past 240 hours)  Resp panel by RT-PCR (RSV, Flu A&B, Covid) Anterior Nasal Swab     Status: None   Collection Time: 10/10/24  6:35 PM   Specimen: Anterior Nasal Swab  Result Value Ref Range Status    SARS Coronavirus 2 by RT PCR NEGATIVE NEGATIVE Final    Comment: (NOTE) SARS-CoV-2 target nucleic acids are NOT DETECTED.  The SARS-CoV-2 RNA is generally detectable in upper respiratory specimens during the acute phase of infection. The lowest concentration of SARS-CoV-2 viral copies this assay can detect is 138 copies/mL. A negative result does not preclude SARS-Cov-2 infection and should not be used as the sole basis for treatment or other patient management decisions. A negative result may occur with  improper specimen collection/handling, submission of specimen other than nasopharyngeal swab, presence of viral mutation(s) within the areas targeted by this assay, and inadequate number of viral copies(<138 copies/mL). A negative result must be combined with clinical observations, patient history,  and epidemiological information. The expected result is Negative.  Fact Sheet for Patients:  bloggercourse.com  Fact Sheet for Healthcare Providers:  seriousbroker.it  This test is no t yet approved or cleared by the [place]  FDA and  has been authorized for detection and/or diagnosis of SARS-CoV-2 by FDA under an Emergency Use Authorization (EUA). This EUA will remain  in effect (meaning this test can be used) for the duration of the COVID-19 declaration under Section 564(b)(1) of the Act, 21 U.S.C.section 360bbb-3(b)(1), unless the authorization is terminated  or revoked sooner.       Influenza A by PCR NEGATIVE NEGATIVE Final   Influenza B by PCR NEGATIVE NEGATIVE Final    Comment: (NOTE) The Xpert Xpress SARS-CoV-2/FLU/RSV plus assay is intended as an aid in the diagnosis of influenza from Nasopharyngeal swab specimens and should not be used as a sole basis for treatment. Nasal washings and aspirates are unacceptable for Xpert Xpress SARS-CoV-2/FLU/RSV testing.  Fact Sheet for  Patients: bloggercourse.com  Fact Sheet for Healthcare Providers: seriousbroker.it  This test is not yet approved or cleared by the [place]  FDA and has been authorized for detection and/or diagnosis of SARS-CoV-2 by FDA under an Emergency Use Authorization (EUA). This EUA will remain in effect (meaning this test can be used) for the duration of the COVID-19 declaration under Section 564(b)(1) of the Act, 21 U.S.C. section 360bbb-3(b)(1), unless the authorization is terminated or revoked.     Resp Syncytial Virus by PCR NEGATIVE NEGATIVE Final    Comment: (NOTE) Fact Sheet for Patients: bloggercourse.com  Fact Sheet for Healthcare Providers: seriousbroker.it  This test is not yet approved or cleared by the [place]  FDA and has been authorized for detection and/or diagnosis of SARS-CoV-2 by FDA under an Emergency Use Authorization (EUA). This EUA will remain in effect (meaning this test can be used) for the duration of the COVID-19 declaration under Section 564(b)(1) of the Act, 21 U.S.C. section 360bbb-3(b)(1), unless the authorization is terminated or revoked.  Performed at [facility], [address], [place], [place] [address]      Radiology Studies: CT Angio Chest Pulmonary Embolism (PE) W or WO Contrast Result Date: 10/16/2024 CLINICAL DATA:  Positive D-dimer.  Pulmonary embolism suspected. EXAM: CT ANGIOGRAPHY CHEST WITH CONTRAST TECHNIQUE: Multidetector CT imaging of the chest was performed using the standard protocol during bolus administration of intravenous contrast. Multiplanar CT image reconstructions and MIPs were obtained to evaluate the vascular anatomy. RADIATION DOSE REDUCTION: This exam was performed according to the departmental dose-optimization program which includes automated exposure control, adjustment of the mA and/or kV  according to patient size and/or use of iterative reconstruction technique. CONTRAST:  75mL OMNIPAQUE  IOHEXOL  350 MG/ML SOLN COMPARISON:  06/15/2022 FINDINGS: Cardiovascular: The heart size is normal. No substantial pericardial effusion. Coronary artery calcification is evident. Mild atherosclerotic calcification is noted in the wall of the thoracic aorta. There is no filling defect within the opacified pulmonary arteries to suggest the presence of an acute pulmonary embolus. Mediastinum/Nodes: No mediastinal lymphadenopathy. There is no hilar lymphadenopathy. The esophagus has normal imaging features. There is no axillary lymphadenopathy. Lungs/Pleura: Centrilobular and paraseptal emphysema evident. 5 mm right upper lobe nodule identified on 69/12. Clustered tree-in-bud nodularity noted left lower lobe with nodules measuring up to 3-4 mm size range. No dense consolidative airspace disease. No pulmonary edema or pleural effusion. Upper Abdomen: Visualized portion of the upper abdomen shows no acute findings. Musculoskeletal: No worrisome lytic or sclerotic osseous abnormality. Review  of the MIP images confirms the above findings. IMPRESSION: 1. No CT evidence for acute pulmonary embolus. 2. Clustered tree-in-bud nodularity left lower lobe with nodules measuring up to 3-4 mm size range. Imaging features are most suggestive of infectious/inflammatory etiology. 3. 5 mm right upper lobe pulmonary nodule. No follow-up needed if patient is low-risk.This recommendation follows the consensus statement: Guidelines for Management of Incidental Pulmonary Nodules Detected on CT Images: From the Fleischner Society 2017; Radiology 2017; 284:228-243. 4.  Coronary artery atherosclerosis. 5. Aortic Atherosclerosis (ICD10-I70.0) and Emphysema (ICD10-J43.9). Electronically Signed   By: Camellia Candle M.D.   On: 10/16/2024 13:45    Scheduled Meds:  amLODipine   5 mg Oral Daily   atenolol   50 mg Oral Daily    budesonide -glycopyrrolate -formoterol   2 puff Inhalation BID   clopidogrel   75 mg Oral Daily   doxycycline   100 mg Oral Q12H   DULoxetine   60 mg Oral q AM   enoxaparin  (LOVENOX ) injection  40 mg Subcutaneous Q24H   feeding supplement  237 mL Oral BID BM   ferrous sulfate   325 mg Oral Q breakfast   ipratropium-albuterol   3 mL Nebulization TID   isosorbide  mononitrate  30 mg Oral BID   levothyroxine   50 mcg Oral QAC breakfast   melatonin  3 mg Oral QHS   multivitamin with minerals  1 tablet Oral Daily   pantoprazole   40 mg Oral QAC breakfast   polyethylene glycol  17 g Oral Daily   senna-docusate  1 tablet Oral BID   simvastatin   40 mg Oral QHS   sodium chloride  flush  3 mL Intravenous Q12H   sodium chloride   2 g Oral TID WC   Continuous Infusions:   LOS: 7 days   Ivonne Mustache, MD [facility] P12/28/2025, 9:49 AM  "

## 2024-10-18 DIAGNOSIS — J9601 Acute respiratory failure with hypoxia: Secondary | ICD-10-CM | POA: Diagnosis not present

## 2024-10-18 DIAGNOSIS — Z7951 Long term (current) use of inhaled steroids: Secondary | ICD-10-CM | POA: Diagnosis not present

## 2024-10-18 DIAGNOSIS — Z8616 Personal history of COVID-19: Secondary | ICD-10-CM | POA: Diagnosis not present

## 2024-10-18 DIAGNOSIS — J441 Chronic obstructive pulmonary disease with (acute) exacerbation: Secondary | ICD-10-CM | POA: Diagnosis not present

## 2024-10-18 LAB — CBC
HCT: 30.7 % — ABNORMAL LOW (ref 36.0–46.0)
Hemoglobin: 10.1 g/dL — ABNORMAL LOW (ref 12.0–15.0)
MCH: 31.4 pg (ref 26.0–34.0)
MCHC: 32.9 g/dL (ref 30.0–36.0)
MCV: 95.3 fL (ref 80.0–100.0)
Platelets: 244 K/uL (ref 150–400)
RBC: 3.22 MIL/uL — ABNORMAL LOW (ref 3.87–5.11)
RDW: 13 % (ref 11.5–15.5)
WBC: 10.3 K/uL (ref 4.0–10.5)
nRBC: 0 % (ref 0.0–0.2)

## 2024-10-18 LAB — BASIC METABOLIC PANEL WITH GFR
Anion gap: 9 (ref 5–15)
BUN: 18 mg/dL (ref 8–23)
CO2: 24 mmol/L (ref 22–32)
Calcium: 9.1 mg/dL (ref 8.9–10.3)
Chloride: 102 mmol/L (ref 98–111)
Creatinine, Ser: 1.15 mg/dL — ABNORMAL HIGH (ref 0.44–1.00)
GFR, Estimated: 50 mL/min — ABNORMAL LOW
Glucose, Bld: 111 mg/dL — ABNORMAL HIGH (ref 70–99)
Potassium: 4.3 mmol/L (ref 3.5–5.1)
Sodium: 134 mmol/L — ABNORMAL LOW (ref 135–145)

## 2024-10-18 MED ORDER — SODIUM CHLORIDE 1 G PO TABS
1.0000 g | ORAL_TABLET | Freq: Three times a day (TID) | ORAL | Status: DC
  Administered 2024-10-18 – 2024-10-23 (×15): 1 g via ORAL
  Filled 2024-10-18 (×15): qty 1

## 2024-10-18 NOTE — Progress Notes (Signed)
 " PROGRESS NOTE  Shannon Roberts  FMW:[id number] DOB: 1951-04-08 DOA: 10/10/2024 PCP: Okey Carlin Redbird, MD   Brief Narrative: Shannon Roberts is a 73 y.o. female with past medical history significant for HTN, HLD, CKD stage IIIa, hypothyroidism, COPD, SIADH, obesity, anxiety, recent COVID-19 viral infection who presented to [facility] ED on 10/10/2024 via EMS from home with complaints of progressive shortness of breath, worse with exertion.  COVID/flu/influenza negative.  Chest x-ray did not show any active cardiopulmonary disease.  Patient admitted for the management of COPD exacerbation.  Started on IV steroids, bronchodilators.  PCCM also consulted.  On 2 L of oxygen this morning.  PT recommending SNF on discharge.  TOC following.  Clinically improved now, ready for SNF discharge.TOC following  Assessment & Plan:  Active Problems:   COPD exacerbation (HCC)   Hyperlipidemia   Anxiety disorder   History of CVA (cerebrovascular accident)   Anemia   Chronic kidney disease, stage 3a (HCC)   Hyponatremia   Hypothyroidism   Class 1 obesity due to excess calories with body mass index (BMI) of 31.0 to 31.9 in adult   CAD (coronary artery disease)   Leg edema, right  Acute hypoxic respiratory failure, POA COPD exacerbation Recent COVID-19 viral infection Patient presenting with progressive shortness of breath, not oxygen dependent at baseline although was recently discharged on home oxygen from recent Covid-19 viral infection.  Has underlying COPD, follows with pulmonology Dr. Kara outpatient.  Patient is afebrile without leukocytosis.  Chest x-ray with no focal consolidation. -- she completed abx course -- Breztri  2 puffs twice daily (substituted for home Trelegy Ellipta ) -- Complete 5-day course of prednisone   -- Continue supplemental oxygen to maintain SpO2 greater than 88%; currently on 2 L nasal cannula with SpO2 98% at rest -New onset leukocytosis is  most likely from  steroids. -Most likely she might need to continued on oxygen supplementation on discharge.   Elevated D-dimer D-dimer elevated 0.99, but normalizes with age adjustment.  Vascular duplex ultrasound right lower extremity negative for DVT. -- CT angiogram chest : no evidence of PE   HTN CAD HLD -- Atenolol  50 mg p.o. daily -- Isosorbide  mononitrate 30 m p.o. daily -- Plavix  75 mg p.o. daily -- Simvastatin  40 mg p.o. daily   CKD stage IIIa -- Kidney function stable   Hypothyroidism -- Continue levothyroxine  50 mcg p.o. daily   SIADH -- Na 135>136>138>134 -- Sodium chloride  2 g p.o. 3 times daily AC -- 1200 mL fluid restriction   GERD -- Protonix  40 mg p.o. daily   Anxiety -- Lorazepam  0.5 - 0.75 mg p.o. every 6 hours as needed anxiety   Obesity, class I Body mass index is 30.34 kg/m.    Weakness/debility/deconditioning: -- PT now recommending SNF placement -- TOC following    Nutrition Problem: Increased nutrient needs Etiology: chronic illness (COPD)    DVT prophylaxis:enoxaparin  (LOVENOX ) injection 40 mg Start: 10/12/24 1200 SCDs Start: 10/11/24 0343     Code Status: Full Code  Family Communication: None at the bedside  Patient status:Inpatient  Patient is from :home  Anticipated discharge to:SNF  Estimated DC date:whenever possible   Consultants: PCCM  Procedures:None  Antimicrobials:  Anti-infectives (From admission, onward)    Start     Dose/Rate Route Frequency Ordered Stop   10/11/24 1000  doxycycline  (VIBRA -TABS) tablet 100 mg        100 mg Oral Every 12 hours 10/11/24 0342 10/17/24 2152       Subjective:  Patient seen and examined at bedside today.  Hemodynamically stable.  Comfortable.  No new complaints. .  No worsening shortness of breath or cough.  Objective: Vitals:   10/17/24 2023 10/18/24 0513 10/18/24 0743 10/18/24 0933  BP: (!) 112/58 132/63  115/72  Pulse: 77 78  85  Resp: 18 16    Temp: 97.9 F (36.6 C) 98.2 F  (36.8 C)  98 F (36.7 C)  TempSrc: Oral Oral  Oral  SpO2: 99% 100% 99% 99%  Weight:      Height:        Intake/Output Summary (Last 24 hours) at 10/18/2024 1153 Last data filed at 10/18/2024 9373 Gross per 24 hour  Intake --  Output 500 ml  Net -500 ml   Filed Weights   10/10/24 1736  Weight: 82.7 kg    Examination:     General exam: Overall comfortable, not in distress HEENT: PERRL Respiratory system: Mild bilateral expiratory wheezing Cardiovascular system: S1 & S2 heard, RRR.  Gastrointestinal system: Abdomen is nondistended, soft and nontender. Central nervous system: Alert and oriented Extremities: No edema, no clubbing ,no cyanosis Skin: No rashes, no ulcers,no icterus     Data Reviewed: I have personally reviewed following labs and imaging studies  CBC: Recent Labs  Lab 10/17/24 0359 10/18/24 0347  WBC 13.2* 10.3  HGB 10.7* 10.1*  HCT 32.7* 30.7*  MCV 93.7 95.3  PLT 295 244   Basic Metabolic Panel: Recent Labs  Lab 10/12/24 0410 10/15/24 0337 10/17/24 0359 10/18/24 0347  NA 138 134* 131* 134*  K 5.0 4.6 4.3 4.3  CL 104 99 98 102  CO2 25 25 24 24   GLUCOSE 180* 117* 118* 111*  BUN 24* 17 19 18   CREATININE 1.12* 1.00 1.12* 1.15*  CALCIUM  10.0 9.8 9.2 9.1     Recent Results (from the past 240 hours)  Resp panel by RT-PCR (RSV, Flu A&B, Covid) Anterior Nasal Swab     Status: None   Collection Time: 10/10/24  6:35 PM   Specimen: Anterior Nasal Swab  Result Value Ref Range Status   SARS Coronavirus 2 by RT PCR NEGATIVE NEGATIVE Final    Comment: (NOTE) SARS-CoV-2 target nucleic acids are NOT DETECTED.  The SARS-CoV-2 RNA is generally detectable in upper respiratory specimens during the acute phase of infection. The lowest concentration of SARS-CoV-2 viral copies this assay can detect is 138 copies/mL. A negative result does not preclude SARS-Cov-2 infection and should not be used as the sole basis for treatment or other patient management  decisions. A negative result may occur with  improper specimen collection/handling, submission of specimen other than nasopharyngeal swab, presence of viral mutation(s) within the areas targeted by this assay, and inadequate number of viral copies(<138 copies/mL). A negative result must be combined with clinical observations, patient history, and epidemiological information. The expected result is Negative.  Fact Sheet for Patients:  bloggercourse.com  Fact Sheet for Healthcare Providers:  seriousbroker.it  This test is no t yet approved or cleared by the [place]  FDA and  has been authorized for detection and/or diagnosis of SARS-CoV-2 by FDA under an Emergency Use Authorization (EUA). This EUA will remain  in effect (meaning this test can be used) for the duration of the COVID-19 declaration under Section 564(b)(1) of the Act, 21 U.S.C.section 360bbb-3(b)(1), unless the authorization is terminated  or revoked sooner.       Influenza A by PCR NEGATIVE NEGATIVE Final   Influenza B by PCR NEGATIVE NEGATIVE Final  Comment: (NOTE) The Xpert Xpress SARS-CoV-2/FLU/RSV plus assay is intended as an aid in the diagnosis of influenza from Nasopharyngeal swab specimens and should not be used as a sole basis for treatment. Nasal washings and aspirates are unacceptable for Xpert Xpress SARS-CoV-2/FLU/RSV testing.  Fact Sheet for Patients: bloggercourse.com  Fact Sheet for Healthcare Providers: seriousbroker.it  This test is not yet approved or cleared by the [place]  FDA and has been authorized for detection and/or diagnosis of SARS-CoV-2 by FDA under an Emergency Use Authorization (EUA). This EUA will remain in effect (meaning this test can be used) for the duration of the COVID-19 declaration under Section 564(b)(1) of the Act, 21 U.S.C. section 360bbb-3(b)(1), unless the  authorization is terminated or revoked.     Resp Syncytial Virus by PCR NEGATIVE NEGATIVE Final    Comment: (NOTE) Fact Sheet for Patients: bloggercourse.com  Fact Sheet for Healthcare Providers: seriousbroker.it  This test is not yet approved or cleared by the [place]  FDA and has been authorized for detection and/or diagnosis of SARS-CoV-2 by FDA under an Emergency Use Authorization (EUA). This EUA will remain in effect (meaning this test can be used) for the duration of the COVID-19 declaration under Section 564(b)(1) of the Act, 21 U.S.C. section 360bbb-3(b)(1), unless the authorization is terminated or revoked.  Performed at [facility], [address] [place], [place] [address]      Radiology Studies: CT Angio Chest Pulmonary Embolism (PE) W or WO Contrast Result Date: 10/16/2024 CLINICAL DATA:  Positive D-dimer.  Pulmonary embolism suspected. EXAM: CT ANGIOGRAPHY CHEST WITH CONTRAST TECHNIQUE: Multidetector CT imaging of the chest was performed using the standard protocol during bolus administration of intravenous contrast. Multiplanar CT image reconstructions and MIPs were obtained to evaluate the vascular anatomy. RADIATION DOSE REDUCTION: This exam was performed according to the departmental dose-optimization program which includes automated exposure control, adjustment of the mA and/or kV according to patient size and/or use of iterative reconstruction technique. CONTRAST:  75mL OMNIPAQUE  IOHEXOL  350 MG/ML SOLN COMPARISON:  06/15/2022 FINDINGS: Cardiovascular: The heart size is normal. No substantial pericardial effusion. Coronary artery calcification is evident. Mild atherosclerotic calcification is noted in the wall of the thoracic aorta. There is no filling defect within the opacified pulmonary arteries to suggest the presence of an acute pulmonary embolus. Mediastinum/Nodes: No mediastinal  lymphadenopathy. There is no hilar lymphadenopathy. The esophagus has normal imaging features. There is no axillary lymphadenopathy. Lungs/Pleura: Centrilobular and paraseptal emphysema evident. 5 mm right upper lobe nodule identified on 69/12. Clustered tree-in-bud nodularity noted left lower lobe with nodules measuring up to 3-4 mm size range. No dense consolidative airspace disease. No pulmonary edema or pleural effusion. Upper Abdomen: Visualized portion of the upper abdomen shows no acute findings. Musculoskeletal: No worrisome lytic or sclerotic osseous abnormality. Review of the MIP images confirms the above findings. IMPRESSION: 1. No CT evidence for acute pulmonary embolus. 2. Clustered tree-in-bud nodularity left lower lobe with nodules measuring up to 3-4 mm size range. Imaging features are most suggestive of infectious/inflammatory etiology. 3. 5 mm right upper lobe pulmonary nodule. No follow-up needed if patient is low-risk.This recommendation follows the consensus statement: Guidelines for Management of Incidental Pulmonary Nodules Detected on CT Images: From the Fleischner Society 2017; Radiology 2017; 284:228-243. 4.  Coronary artery atherosclerosis. 5. Aortic Atherosclerosis (ICD10-I70.0) and Emphysema (ICD10-J43.9). Electronically Signed   By: Camellia Candle M.D.   On: 10/16/2024 13:45    Scheduled Meds:  amLODipine   5 mg Oral Daily  atenolol   50 mg Oral Daily   budesonide -glycopyrrolate -formoterol   2 puff Inhalation BID   clopidogrel   75 mg Oral Daily   DULoxetine   60 mg Oral q AM   enoxaparin  (LOVENOX ) injection  40 mg Subcutaneous Q24H   feeding supplement  237 mL Oral BID BM   ferrous sulfate   325 mg Oral Q breakfast   ipratropium-albuterol   3 mL Nebulization TID   isosorbide  mononitrate  30 mg Oral BID   levothyroxine   50 mcg Oral QAC breakfast   melatonin  3 mg Oral QHS   multivitamin with minerals  1 tablet Oral Daily   pantoprazole   40 mg Oral QAC breakfast   polyethylene  glycol  17 g Oral Daily   senna-docusate  1 tablet Oral BID   simvastatin   40 mg Oral QHS   sodium chloride  flush  3 mL Intravenous Q12H   sodium chloride  HYPERTONIC  4 mL Nebulization Daily   sodium chloride   2 g Oral TID WC   Continuous Infusions:   LOS: 8 days   Ivonne Mustache, MD [facility] P12/29/2025, 11:53 AM  "

## 2024-10-18 NOTE — Plan of Care (Signed)
  Problem: Health Behavior/Discharge Planning: Goal: Ability to manage health-related needs will improve Outcome: Progressing   Problem: Clinical Measurements: Goal: Will remain free from infection Outcome: Progressing   Problem: Clinical Measurements: Goal: Diagnostic test results will improve Outcome: Progressing   Problem: Clinical Measurements: Goal: Respiratory complications will improve Outcome: Progressing   Problem: Clinical Measurements: Goal: Cardiovascular complication will be avoided Outcome: Progressing

## 2024-10-18 NOTE — Progress Notes (Signed)
 "  NAME:  Shannon Roberts, MRN:  [id number], DOB:  1951-09-08, LOS: 8 ADMISSION DATE:  10/10/2024, CONSULTATION DATE:  10/16/24 REFERRING MD:  TRH CHIEF COMPLAINT:  COPD exacerbation   History of Present Illness:  71F with history of HTN, HLD, CKD stage IIIa, hypothyroidism, COPD, SIADH, obesity, anxiety, recent COVID-19 viral infection who presented to [facility] ED on 10/10/2024 via EMS from home with complaints of progressive shortness of breath, worse with exertion. She was admitted for COPD exacerbation. She has been evaluated by PT and SNF has been recommended. CTA Chest done today to evaluate for PE and it was negative for PE.   She is being treated with doxycycline  and steroids along with breztri  inhaler and as needed nebulizer treatments. She reports feeling better today, just very weak overall.   PCCM consulted for COPD exacerbation.  Pertinent  Medical History   Past Medical History:  Diagnosis Date   Allergic rhinitis    Anemia    per hosp. - recently- pt. told that she is anemic   Anxiety    Colitis    COPD (chronic obstructive pulmonary disease) (HCC)    no o2   Depression    Diverticulosis    Female bladder prolapse    Fibroids    GERD (gastroesophageal reflux disease)    Heart murmur    Hx of adenomatous colonic polyps 08/26/2019   Hyperlipemia    Hypertension    IBS (irritable bowel syndrome)    Insomnia    Lip abscess 04/05/2016   Mitral valve prolapse    Peripheral vascular disease    Pre-diabetes 08/03/2019   Diet controlled     Right bundle branch block    Sciatic nerve disease    Shortness of breath    Stroke [facility]) March 2015    Significant Hospital Events: Including procedures, antibiotic start and stop dates in addition to other pertinent events     Interim History / Subjective:   No acute events overnight  Objective    Blood pressure 115/72, pulse 85, temperature 98 F (36.7 C), temperature source Oral, resp. rate 16, height 5'  5 (1.651 m), weight 82.7 kg, SpO2 99%.        Intake/Output Summary (Last 24 hours) at 10/18/2024 1111 Last data filed at 10/18/2024 9373 Gross per 24 hour  Intake --  Output 500 ml  Net -500 ml   Filed Weights   10/10/24 1736  Weight: 82.7 kg    Examination: General: elderly woman, no distress HENT: [place]/AT, moist mucous membranes Lungs: slight wheeze of right anterior upper lung field, diminished air movement Cardiovascular: rrr, no murmurs Abdomen: soft, non-distended, non-tender Extremities: warm, no edema Neuro: alert, oriented, moving all extremities GU: n/a  Resolved problem list   Assessment and Plan  Acute Hypoxemic respiratory Failure COPD Exacerbation Recent Covid 19 Infection Deconditioning  - continue breztri  inhaler 2 puffs twice daily - PRN duonebs - completed doxycycline  and steroid courses - Use hypertonic nebs for airway clearance with flutter valve/IS - agree with plan to discharge to SNF as phase 1 of her rehab plans and then after follow up in pulmonary clinic will arrange for her to go to pulmonary rehab as phase 2.  PCCM will continue to follow   Labs   CBC: Recent Labs  Lab 10/17/24 0359 10/18/24 0347  WBC 13.2* 10.3  HGB 10.7* 10.1*  HCT 32.7* 30.7*  MCV 93.7 95.3  PLT 295 244    Basic Metabolic Panel: Recent  Labs  Lab 10/12/24 0410 10/15/24 0337 10/17/24 0359 10/18/24 0347  NA 138 134* 131* 134*  K 5.0 4.6 4.3 4.3  CL 104 99 98 102  CO2 25 25 24 24   GLUCOSE 180* 117* 118* 111*  BUN 24* 17 19 18   CREATININE 1.12* 1.00 1.12* 1.15*  CALCIUM  10.0 9.8 9.2 9.1   GFR: Estimated Creatinine Clearance: 45.6 mL/min (A) (by C-G formula based on SCr of 1.15 mg/dL (H)). Recent Labs  Lab 10/17/24 0359 10/18/24 0347  WBC 13.2* 10.3    Liver Function Tests: No results for input(s): AST, ALT, ALKPHOS, BILITOT, PROT, ALBUMIN  in the last 168 hours.  No results for input(s): LIPASE, AMYLASE in the last 168  hours. No results for input(s): AMMONIA in the last 168 hours.  ABG    Component Value Date/Time   HCO3 23.6 10/11/2024 0020   TCO2 21 (L) 10/10/2024 2021   ACIDBASEDEF 1.2 10/11/2024 0020   O2SAT 84.7 10/11/2024 0020     Coagulation Profile: No results for input(s): INR, PROTIME in the last 168 hours.  Cardiac Enzymes: No results for input(s): CKTOTAL, CKMB, CKMBINDEX, TROPONINI in the last 168 hours.  HbA1C: Hgb A1c MFr Bld  Date/Time Value Ref Range Status  01/02/2014 10:55 PM 6.0 (H) <5.7 % Final    Comment:    (NOTE)                                                                       According to the ADA Clinical Practice Recommendations for 2011, when HbA1c is used as a screening test:  >=6.5%   Diagnostic of Diabetes Mellitus           (if abnormal result is confirmed) 5.7-6.4%   Increased risk of developing Diabetes Mellitus References:Diagnosis and Classification of Diabetes Mellitus,Diabetes Care,2011,34(Suppl 1):S62-S69 and Standards of Medical Care in         Diabetes - 2011,Diabetes Care,2011,34 (Suppl 1):S11-S61.    CBG: No results for input(s): GLUCAP in the last 168 hours.     Critical care time: n/a    Dorn Chill, MD [facility] Office: [phone]   See Amion for personal pager PCCM on call pager [phone] until 7pm. Please call Elink 7p-7a. [phone]          "

## 2024-10-18 NOTE — Progress Notes (Signed)
 Physical Therapy Treatment Patient Details Name: Shannon Roberts MRN: [id number] DOB: 10/18/1951 Today's Date: 10/18/2024   History of Present Illness Pt is a 73 y/o female admitted for COPD exacerbation, acute resp failure PMH: COPD, hyponatremia, GERD, hypertension, HLD, anxiety, CVA, CKD, hypothyroidism, CAD, falls, shoulder surgery    PT Comments  Pt agreeable to working with therapy. O2: 98% on 2L at rest, 96% on 2L with ambulation. She ambulated ~75 feet with a rollator. Pt tolerated activity well-remained on O2. Cues for pursed lip breathing and pacing provided throughout session. Pt with mild anxiety about progress activity and ambulating outside of room-with encouragement and reassurance pt able to continue. Plan is for ST rehab-pt awaiting decision.   If plan is discharge home, recommend the following: A little help with walking and/or transfers;A little help with bathing/dressing/bathroom;Assistance with cooking/housework;Assist for transportation;Help with stairs or ramp for entrance   Can travel by private vehicle     Yes  Equipment Recommendations  None recommended by PT    Recommendations for Other Services       Precautions / Restrictions Precautions Precautions: Fall Precaution/Restrictions Comments: monitor O2 Restrictions Weight Bearing Restrictions Per Provider Order: No     Mobility  Bed Mobility Overal bed mobility: Modified Independent             General bed mobility comments: increased time. cues for pursed lip breathing as needed.    Transfers Overall transfer level: Needs assistance Equipment used: Rollator (4 wheels) Transfers: Sit to/from Stand Sit to Stand: Contact guard assist           General transfer comment: Cues for safety, hand placement, proper operation of rollator.    Ambulation/Gait Ambulation/Gait assistance: Contact guard assist Gait Distance (Feet): 75 Feet Assistive device: Rollator (4 wheels) Gait  Pattern/deviations: Step-through pattern, Decreased stride length       General Gait Details: slow gait speed. cues for pacing and pursed lip breathing throughout distance. O2 96% on 2L. One brief standing rest break taken at ~40 feet then a seated rest break taken at ~75 feet.   Stairs             Wheelchair Mobility     Tilt Bed    Modified Rankin (Stroke Patients Only)       Balance Overall balance assessment: Needs assistance, History of Falls         Standing balance support: During functional activity, Bilateral upper extremity supported, Reliant on assistive device for balance Standing balance-Leahy Scale: Fair                              Hotel Manager: No apparent difficulties  Cognition Arousal: Alert Behavior During Therapy: WFL for tasks assessed/performed   PT - Cognitive impairments: No apparent impairments                         Following commands: Intact      Cueing Cueing Techniques: Verbal cues  Exercises      General Comments General comments (skin integrity, edema, etc.): SpO2 100% on 2 L O2. RT in to encourage pt to wean from O2 and combat anxiety      Pertinent Vitals/Pain Pain Assessment Pain Assessment: No/denies pain    Home Living                          Prior  Function            PT Goals (current goals can now be found in the care plan section) Progress towards PT goals: Progressing toward goals    Frequency    Min 3X/week      PT Plan      Co-evaluation              AM-PAC PT 6 Clicks Mobility   Outcome Measure  Help needed turning from your back to your side while in a flat bed without using bedrails?: None Help needed moving from lying on your back to sitting on the side of a flat bed without using bedrails?: A Little Help needed moving to and from a bed to a chair (including a wheelchair)?: A Little Help needed standing up from a  chair using your arms (e.g., wheelchair or bedside chair)?: A Little Help needed to walk in hospital room?: A Little Help needed climbing 3-5 steps with a railing? : A Lot 6 Click Score: 18    End of Session Equipment Utilized During Treatment: Oxygen Activity Tolerance: Patient tolerated treatment well Patient left:  (handoff to OT-pt resting on rollator seat)   PT Visit Diagnosis: History of falling (Z91.81);Unsteadiness on feet (R26.81);Difficulty in walking, not elsewhere classified (R26.2)     Time: 8758-8744 PT Time Calculation (min) (ACUTE ONLY): 14 min  Charges:    $Gait Training: 8-22 mins PT General Charges $$ ACUTE PT VISIT: 1 Visit                       Dannial SQUIBB, PT Acute Rehabilitation  Office: [phone]

## 2024-10-18 NOTE — Progress Notes (Signed)
 Occupational Therapy Treatment Patient Details Name: Shannon Roberts MRN: [id number] DOB: 1951-08-03 Today's Date: 10/18/2024   History of present illness Pt is a 73 y/o female admitted for COPD exacerbation, acute resp failure PMH: COPD, hyponatremia, GERD, hypertension, HLD, anxiety, CVA, CKD, hypothyroidism, CAD, falls, shoulder surgery   OT comments  Pt making good progress towards OT goals though does remain anxious regarding potential SOB with activity. Pt received from PT with OT assisting on walk back to room in hallway using Rollator. Pt with good use of rollator brakes and breathing techniques w/ SpO2 100% on 2 L O2. Pt politely deferred ADL training after walk d/t fatigue and RT in for breathing treatment. Plan to further assess SpO2 while weaning O2 during ADLs/mobility. Noted family concerned about pt returning home alone and looking into continued inpatient follow up therapy, <3 hours/day at DC.      If plan is discharge home, recommend the following:  Help with stairs or ramp for entrance;Assistance with cooking/housework   Equipment Recommendations  None recommended by OT    Recommendations for Other Services      Precautions / Restrictions Precautions Precautions: Fall Recall of Precautions/Restrictions: Intact Precaution/Restrictions Comments: monitor O2 Restrictions Weight Bearing Restrictions Per Provider Order: No       Mobility Bed Mobility               General bed mobility comments: received sitting on rollator with PT    Transfers Overall transfer level: Needs assistance Equipment used: Rollator (4 wheels) Transfers: Sit to/from Stand Sit to Stand: Supervision           General transfer comment: good locking of rollator brakes     Balance Overall balance assessment: Needs assistance, History of Falls Sitting-balance support: Feet supported Sitting balance-Leahy Scale: Good     Standing balance support: No upper extremity supported,  During functional activity, Bilateral upper extremity supported Standing balance-Leahy Scale: Fair                             ADL either performed or assessed with clinical judgement   ADL Overall ADL's : Needs assistance/impaired Eating/Feeding: Independent;Sitting   Grooming: Set up;Sitting                               Functional mobility during ADLs: Contact guard assist;Supervision/safety;Rollator (4 wheels) General ADL Comments: Recieved during seated rest break with PT in hallway on rollator. OT took over session for walk back to room, facilitation of energy conservation and discussion of ADL strategies. pt politely declined ADL retraining after walk and RT in for breathing treatment    Extremity/Trunk Assessment Upper Extremity Assessment Upper Extremity Assessment: Overall WFL for tasks assessed;Right hand dominant   Lower Extremity Assessment Lower Extremity Assessment: Defer to PT evaluation        Vision   Vision Assessment?: No apparent visual deficits   Perception     Praxis     Communication Communication Communication: No apparent difficulties   Cognition Arousal: Alert Behavior During Therapy: WFL for tasks assessed/performed, Anxious Cognition: No apparent impairments             OT - Cognition Comments: Oriented, witty, anxious with potential DOE                 Following commands: Intact        Cueing   Cueing Techniques:  Verbal cues  Exercises      Shoulder Instructions       General Comments SpO2 100% on 2 L O2. RT in to encourage pt to wean from O2 and combat anxiety    Pertinent Vitals/ Pain       Pain Assessment Pain Assessment: No/denies pain  Home Living                                          Prior Functioning/Environment              Frequency  Min 2X/week        Progress Toward Goals  OT Goals(current goals can now be found in the care plan section)   Progress towards OT goals: Progressing toward goals  Acute Rehab OT Goals Patient Stated Goal: be able to slowly wean off of O2, improve breathing OT Goal Formulation: With patient Time For Goal Achievement: 10/25/24 Potential to Achieve Goals: Good ADL Goals Pt Will Perform Grooming: with modified independence;standing Pt Will Perform Lower Body Bathing: with modified independence;sit to/from stand;sitting/lateral leans Pt Will Perform Lower Body Dressing: with modified independence;sit to/from stand Pt Will Transfer to Toilet: with modified independence;ambulating Pt Will Perform Toileting - Clothing Manipulation and hygiene: with modified independence;sit to/from stand Pt Will Perform Tub/Shower Transfer: Tub transfer;with set-up;ambulating;rolling walker Additional ADL Goal #1: Pt to verbalize at least 3 energy conservation strategies to implement during ADLs, IADLs and mobility Additional ADL Goal #2: Pt to demo ability to gather ADL/IADL items MOD I without LOB or safety concerns  Plan      Co-evaluation                 AM-PAC OT 6 Clicks Daily Activity     Outcome Measure   Help from another person eating meals?: None Help from another person taking care of personal grooming?: A Little Help from another person toileting, which includes using toliet, bedpan, or urinal?: A Little Help from another person bathing (including washing, rinsing, drying)?: A Little Help from another person to put on and taking off regular upper body clothing?: None Help from another person to put on and taking off regular lower body clothing?: A Little 6 Click Score: 20    End of Session Equipment Utilized During Treatment: Gait belt;Rollator (4 wheels);Oxygen  OT Visit Diagnosis: Muscle weakness (generalized) (M62.81);Other abnormalities of gait and mobility (R26.89)   Activity Tolerance Patient tolerated treatment well   Patient Left in chair;with call bell/phone within reach;with  chair alarm set   Nurse Communication Mobility status        Time: 8747-8688 OT Time Calculation (min): 19 min  Charges: OT General Charges $OT Visit: 1 Visit OT Treatments $Therapeutic Activity: 8-22 mins  Mliss NOVAK, OTR/L Acute Rehab Services Office: [phone]   Mliss Fish 10/18/2024, 1:35 PM

## 2024-10-18 NOTE — TOC Progression Note (Signed)
 Transition of Care [facility]) - Progression Note    Patient Details  Name: Shannon Roberts MRN: [id number] Date of Birth: 1951/02/16  Transition of Care [facility]) CM/SW Contact  Tawni CHRISTELLA Eva, LCSW Phone Number: 10/18/2024, 2:31 PM  Clinical Narrative:      CSW reached out to Whitney with [facility] to inquire if they can off pt a SNF bed. Awiting response back . iCM to follow    Received messaged [facility] can offer pt a bed. CSW spoke with pt's daughter and pt to present bed offer. Pt reports that she needs time to make a decision. ICM to follow.   Expected Discharge Plan: Skilled Nursing Facility Barriers to Discharge: Continued Medical Work up               Expected Discharge Plan and Services In-house Referral: Clinical Social Work   Post Acute Care Choice: Resumption of Svcs/PTA Provider Living arrangements for the past 2 months: Single Family Home                 DME Arranged: N/A DME Agency: NA                   Social Drivers of Health (SDOH) Interventions SDOH Screenings   Food Insecurity: No Food Insecurity (10/11/2024)  Housing: Low Risk (10/11/2024)  Transportation Needs: No Transportation Needs (10/11/2024)  Utilities: Not At Risk (10/11/2024)  Social Connections: Socially Isolated (10/11/2024)  Tobacco Use: Medium Risk (10/10/2024)    Readmission Risk Interventions    10/11/2024    2:25 PM 10/07/2024    5:40 PM 10/05/2024    9:23 AM  Readmission Risk Prevention Plan  Transportation Screening Complete Complete Complete  PCP or Specialist Appt within 5-7 Days  Complete Complete  Home Care Screening  Complete Complete  Medication Review (RN CM)  Complete Complete  HRI or Home Care Consult Complete    Social Work Consult for Recovery Care Planning/Counseling Complete    Palliative Care Screening Not Applicable    Medication Review [employer]) Complete

## 2024-10-18 NOTE — Plan of Care (Signed)

## 2024-10-19 DIAGNOSIS — J9601 Acute respiratory failure with hypoxia: Secondary | ICD-10-CM | POA: Diagnosis not present

## 2024-10-19 DIAGNOSIS — Z8616 Personal history of COVID-19: Secondary | ICD-10-CM | POA: Diagnosis not present

## 2024-10-19 DIAGNOSIS — Z7951 Long term (current) use of inhaled steroids: Secondary | ICD-10-CM | POA: Diagnosis not present

## 2024-10-19 DIAGNOSIS — J441 Chronic obstructive pulmonary disease with (acute) exacerbation: Secondary | ICD-10-CM | POA: Diagnosis not present

## 2024-10-19 NOTE — Progress Notes (Signed)
 "  NAME:  Shannon Roberts, MRN:  [id number], DOB:  1951-07-14, LOS: 9 ADMISSION DATE:  10/10/2024, CONSULTATION DATE:  10/16/24 REFERRING MD:  TRH CHIEF COMPLAINT:  COPD exacerbation   History of Present Illness:  2F with history of HTN, HLD, CKD stage IIIa, hypothyroidism, COPD, SIADH, obesity, anxiety, recent COVID-19 viral infection who presented to [facility] ED on 10/10/2024 via EMS from home with complaints of progressive shortness of breath, worse with exertion. She was admitted for COPD exacerbation. She has been evaluated by PT and SNF has been recommended. CTA Chest done today to evaluate for PE and it was negative for PE.   She is being treated with doxycycline  and steroids along with breztri  inhaler and as needed nebulizer treatments. She reports feeling better today, just very weak overall.   PCCM consulted for COPD exacerbation.  Pertinent  Medical History   Past Medical History:  Diagnosis Date   Allergic rhinitis    Anemia    per hosp. - recently- pt. told that she is anemic   Anxiety    Colitis    COPD (chronic obstructive pulmonary disease) (HCC)    no o2   Depression    Diverticulosis    Female bladder prolapse    Fibroids    GERD (gastroesophageal reflux disease)    Heart murmur    Hx of adenomatous colonic polyps 08/26/2019   Hyperlipemia    Hypertension    IBS (irritable bowel syndrome)    Insomnia    Lip abscess 04/05/2016   Mitral valve prolapse    Peripheral vascular disease    Pre-diabetes 08/03/2019   Diet controlled     Right bundle branch block    Sciatic nerve disease    Shortness of breath    Stroke [facility]) March 2015    Significant Hospital Events: Including procedures, antibiotic start and stop dates in addition to other pertinent events     Interim History / Subjective:   No acute events overnight  Objective    Blood pressure 115/84, pulse 90, temperature 98.2 F (36.8 C), temperature source Oral, resp. rate 20, height 5'  5 (1.651 m), weight 82.7 kg, SpO2 94%.    FiO2 (%):  [28 %] 28 %   Intake/Output Summary (Last 24 hours) at 10/19/2024 0950 Last data filed at 10/19/2024 0415 Gross per 24 hour  Intake 240 ml  Output 675 ml  Net -435 ml   Filed Weights   10/10/24 1736  Weight: 82.7 kg    Examination: General: elderly woman, no distress HENT: [place]/AT, moist mucous membranes Lungs: no wheezing, diminished air movement Cardiovascular: rrr, no murmurs Abdomen: soft, non-distended, non-tender Extremities: warm, no edema Neuro: alert, oriented, moving all extremities GU: n/a  Resolved problem list   Assessment and Plan  Acute Hypoxemic respiratory Failure COPD Exacerbation Recent Covid 19 Infection Deconditioning  - continue breztri  inhaler 2 puffs twice daily - PRN duonebs - completed doxycycline  and steroid courses - Use hypertonic nebs for airway clearance with flutter valve/IS - can discharge her with script to continue hypertonic nebs as needed - agree with plan to discharge to SNF as phase 1 of her rehab plans and then after follow up in pulmonary clinic will arrange for her to go to pulmonary rehab as phase 2.  PCCM will sign off.   Labs   CBC: Recent Labs  Lab 10/17/24 0359 10/18/24 0347  WBC 13.2* 10.3  HGB 10.7* 10.1*  HCT 32.7* 30.7*  MCV 93.7 95.3  PLT 295 244    Basic Metabolic Panel: Recent Labs  Lab 10/15/24 0337 10/17/24 0359 10/18/24 0347  NA 134* 131* 134*  K 4.6 4.3 4.3  CL 99 98 102  CO2 25 24 24   GLUCOSE 117* 118* 111*  BUN 17 19 18   CREATININE 1.00 1.12* 1.15*  CALCIUM  9.8 9.2 9.1   GFR: Estimated Creatinine Clearance: 45.6 mL/min (A) (by C-G formula based on SCr of 1.15 mg/dL (H)). Recent Labs  Lab 10/17/24 0359 10/18/24 0347  WBC 13.2* 10.3    Liver Function Tests: No results for input(s): AST, ALT, ALKPHOS, BILITOT, PROT, ALBUMIN  in the last 168 hours.  No results for input(s): LIPASE, AMYLASE in the last 168  hours. No results for input(s): AMMONIA in the last 168 hours.  ABG    Component Value Date/Time   HCO3 23.6 10/11/2024 0020   TCO2 21 (L) 10/10/2024 2021   ACIDBASEDEF 1.2 10/11/2024 0020   O2SAT 84.7 10/11/2024 0020     Coagulation Profile: No results for input(s): INR, PROTIME in the last 168 hours.  Cardiac Enzymes: No results for input(s): CKTOTAL, CKMB, CKMBINDEX, TROPONINI in the last 168 hours.  HbA1C: Hgb A1c MFr Bld  Date/Time Value Ref Range Status  01/02/2014 10:55 PM 6.0 (H) <5.7 % Final    Comment:    (NOTE)                                                                       According to the ADA Clinical Practice Recommendations for 2011, when HbA1c is used as a screening test:  >=6.5%   Diagnostic of Diabetes Mellitus           (if abnormal result is confirmed) 5.7-6.4%   Increased risk of developing Diabetes Mellitus References:Diagnosis and Classification of Diabetes Mellitus,Diabetes Care,2011,34(Suppl 1):S62-S69 and Standards of Medical Care in         Diabetes - 2011,Diabetes Care,2011,34 (Suppl 1):S11-S61.    CBG: No results for input(s): GLUCAP in the last 168 hours.     Critical care time: n/a    Dorn Chill, MD [facility] Office: [phone]   See Amion for personal pager PCCM on call pager [phone] until 7pm. Please call Elink 7p-7a. [phone]          "

## 2024-10-19 NOTE — Plan of Care (Signed)

## 2024-10-19 NOTE — Progress Notes (Signed)
 " PROGRESS NOTE  Shannon Roberts  FMW:[id number] DOB: 1951/08/06 DOA: 10/10/2024 PCP: Okey Carlin Redbird, MD   Brief Narrative: Shannon Roberts is a 73 y.o. female with past medical history significant for HTN, HLD, CKD stage IIIa, hypothyroidism, COPD, SIADH, obesity, anxiety, recent COVID-19 viral infection who presented to [facility] ED on 10/10/2024 via EMS from home with complaints of progressive shortness of breath, worse with exertion.  COVID/flu/influenza negative.  Chest x-ray did not show any active cardiopulmonary disease.  Patient admitted for the management of COPD exacerbation.  Started on IV steroids, bronchodilators.  PCCM also consulted.  On 2 L of oxygen this morning.  PT recommending SNF on discharge.  TOC following.  Clinically improved now, ready for SNF discharge.TOC following  Assessment & Plan:  Active Problems:   COPD exacerbation (HCC)   Hyperlipidemia   Anxiety disorder   History of CVA (cerebrovascular accident)   Anemia   Chronic kidney disease, stage 3a (HCC)   Hyponatremia   Hypothyroidism   Class 1 obesity due to excess calories with body mass index (BMI) of 31.0 to 31.9 in adult   CAD (coronary artery disease)   Leg edema, right  Acute hypoxic respiratory failure, POA COPD exacerbation Recent COVID-19 viral infection Patient presenting with progressive shortness of breath, not oxygen dependent at baseline although was recently discharged on home oxygen from recent Covid-19 viral infection.  Has underlying COPD, follows with pulmonology Dr. Kara outpatient.  Patient is afebrile without leukocytosis.  Chest x-ray with no focal consolidation. -- she completed abx course -- Breztri  2 puffs twice daily (substituted for home Trelegy Ellipta ) -- Complete 5-day course of prednisone   -- Continue supplemental oxygen to maintain SpO2 greater than 88%; currently on 2 L nasal cannula with SpO2 98% at rest -Most likely she might need to continued on  oxygen supplementation on discharge.   Elevated D-dimer D-dimer elevated 0.99, but normalizes with age adjustment.  Vascular duplex ultrasound right lower extremity negative for DVT. -- CT angiogram chest : no evidence of PE   HTN CAD HLD -- Atenolol  50 mg p.o. daily -- Isosorbide  mononitrate 30 m p.o. daily -- Plavix  75 mg p.o. daily -- Simvastatin  40 mg p.o. daily   CKD stage IIIa -- Kidney function stable   Hypothyroidism -- Continue levothyroxine  50 mcg p.o. daily   SIADH -- Na 135>136>138>134 -- Sodium chloride  1 g p.o. 3 times daily AC -- 1200 mL fluid restriction   GERD -- Protonix  40 mg p.o. daily   Anxiety -- Lorazepam  0.5 - 0.75 mg p.o. every 6 hours as needed anxiety   Obesity, class I Body mass index is 30.34 kg/m.    Weakness/debility/deconditioning: -- PT now recommending SNF placement -- TOC following    Nutrition Problem: Increased nutrient needs Etiology: chronic illness (COPD)    DVT prophylaxis:enoxaparin  (LOVENOX ) injection 40 mg Start: 10/12/24 1200 SCDs Start: 10/11/24 0343     Code Status: Full Code  Family Communication: None at the bedside  Patient status:Inpatient  Patient is from :home  Anticipated discharge to:SNF  Estimated DC date:whenever possible   Consultants: PCCM  Procedures:None  Antimicrobials:  Anti-infectives (From admission, onward)    Start     Dose/Rate Route Frequency Ordered Stop   10/11/24 1000  doxycycline  (VIBRA -TABS) tablet 100 mg        100 mg Oral Every 12 hours 10/11/24 0342 10/17/24 2152       Subjective: Patient seen and examined at bedside today.  Hemodynamically  stable.  Overall comfortable.  She was sitting on a potty.  Does not have wheezing today.  On 2 L of oxygen per minute  Objective: Vitals:   10/18/24 1821 10/18/24 2009 10/19/24 0414 10/19/24 0737  BP:  134/72 115/84   Pulse:  86 90   Resp:  20 20   Temp:  97.9 F (36.6 C) 98.2 F (36.8 C)   TempSrc:  Oral Oral    SpO2: 99% 100% 99% 94%  Weight:      Height:        Intake/Output Summary (Last 24 hours) at 10/19/2024 1303 Last data filed at 10/19/2024 0415 Gross per 24 hour  Intake --  Output 675 ml  Net -675 ml   Filed Weights   10/10/24 1736  Weight: 82.7 kg    Examination:     General exam: Overall comfortable, not in distress HEENT: PERRL Respiratory system: Diminished sound bilaterally, no expiratory wheezing Cardiovascular system: S1 & S2 heard, RRR.  Gastrointestinal system: Abdomen is nondistended, soft and nontender. Central nervous system: Alert and oriented Extremities: No edema, no clubbing ,no cyanosis Skin: No rashes, no ulcers,no icterus      Data Reviewed: I have personally reviewed following labs and imaging studies  CBC: Recent Labs  Lab 10/17/24 0359 10/18/24 0347  WBC 13.2* 10.3  HGB 10.7* 10.1*  HCT 32.7* 30.7*  MCV 93.7 95.3  PLT 295 244   Basic Metabolic Panel: Recent Labs  Lab 10/15/24 0337 10/17/24 0359 10/18/24 0347  NA 134* 131* 134*  K 4.6 4.3 4.3  CL 99 98 102  CO2 25 24 24   GLUCOSE 117* 118* 111*  BUN 17 19 18   CREATININE 1.00 1.12* 1.15*  CALCIUM  9.8 9.2 9.1     Recent Results (from the past 240 hours)  Resp panel by RT-PCR (RSV, Flu A&B, Covid) Anterior Nasal Swab     Status: None   Collection Time: 10/10/24  6:35 PM   Specimen: Anterior Nasal Swab  Result Value Ref Range Status   SARS Coronavirus 2 by RT PCR NEGATIVE NEGATIVE Final    Comment: (NOTE) SARS-CoV-2 target nucleic acids are NOT DETECTED.  The SARS-CoV-2 RNA is generally detectable in upper respiratory specimens during the acute phase of infection. The lowest concentration of SARS-CoV-2 viral copies this assay can detect is 138 copies/mL. A negative result does not preclude SARS-Cov-2 infection and should not be used as the sole basis for treatment or other patient management decisions. A negative result may occur with  improper specimen collection/handling,  submission of specimen other than nasopharyngeal swab, presence of viral mutation(s) within the areas targeted by this assay, and inadequate number of viral copies(<138 copies/mL). A negative result must be combined with clinical observations, patient history, and epidemiological information. The expected result is Negative.  Fact Sheet for Patients:  bloggercourse.com  Fact Sheet for Healthcare Providers:  seriousbroker.it  This test is no t yet approved or cleared by the [place]  FDA and  has been authorized for detection and/or diagnosis of SARS-CoV-2 by FDA under an Emergency Use Authorization (EUA). This EUA will remain  in effect (meaning this test can be used) for the duration of the COVID-19 declaration under Section 564(b)(1) of the Act, 21 U.S.C.section 360bbb-3(b)(1), unless the authorization is terminated  or revoked sooner.       Influenza A by PCR NEGATIVE NEGATIVE Final   Influenza B by PCR NEGATIVE NEGATIVE Final    Comment: (NOTE) The Xpert Xpress SARS-CoV-2/FLU/RSV plus assay  is intended as an aid in the diagnosis of influenza from Nasopharyngeal swab specimens and should not be used as a sole basis for treatment. Nasal washings and aspirates are unacceptable for Xpert Xpress SARS-CoV-2/FLU/RSV testing.  Fact Sheet for Patients: bloggercourse.com  Fact Sheet for Healthcare Providers: seriousbroker.it  This test is not yet approved or cleared by the [place]  FDA and has been authorized for detection and/or diagnosis of SARS-CoV-2 by FDA under an Emergency Use Authorization (EUA). This EUA will remain in effect (meaning this test can be used) for the duration of the COVID-19 declaration under Section 564(b)(1) of the Act, 21 U.S.C. section 360bbb-3(b)(1), unless the authorization is terminated or revoked.     Resp Syncytial Virus by PCR NEGATIVE  NEGATIVE Final    Comment: (NOTE) Fact Sheet for Patients: bloggercourse.com  Fact Sheet for Healthcare Providers: seriousbroker.it  This test is not yet approved or cleared by the [place]  FDA and has been authorized for detection and/or diagnosis of SARS-CoV-2 by FDA under an Emergency Use Authorization (EUA). This EUA will remain in effect (meaning this test can be used) for the duration of the COVID-19 declaration under Section 564(b)(1) of the Act, 21 U.S.C. section 360bbb-3(b)(1), unless the authorization is terminated or revoked.  Performed at [facility], [address], [place], [place] [address]      Radiology Studies: No results found.   Scheduled Meds:  amLODipine   5 mg Oral Daily   atenolol   50 mg Oral Daily   budesonide -glycopyrrolate -formoterol   2 puff Inhalation BID   clopidogrel   75 mg Oral Daily   DULoxetine   60 mg Oral q AM   enoxaparin  (LOVENOX ) injection  40 mg Subcutaneous Q24H   feeding supplement  237 mL Oral BID BM   ferrous sulfate   325 mg Oral Q breakfast   ipratropium-albuterol   3 mL Nebulization TID   isosorbide  mononitrate  30 mg Oral BID   levothyroxine   50 mcg Oral QAC breakfast   melatonin  3 mg Oral QHS   multivitamin with minerals  1 tablet Oral Daily   pantoprazole   40 mg Oral QAC breakfast   polyethylene glycol  17 g Oral Daily   senna-docusate  1 tablet Oral BID   simvastatin   40 mg Oral QHS   sodium chloride  flush  3 mL Intravenous Q12H   sodium chloride   1 g Oral TID WC   Continuous Infusions:   LOS: 9 days   Ivonne Mustache, MD [facility] P12/30/2025, 1:03 PM  "

## 2024-10-19 NOTE — TOC Progression Note (Signed)
 Transition of Care [facility]) - Progression Note    Patient Details  Name: Shannon Roberts MRN: [id number] Date of Birth: 11-May-1951  [facility]) CM/SW Contact  Tawni CHRISTELLA Eva, LCSW Phone Number: 10/19/2024, 9:31 AM  Clinical Narrative:     CSW met with pt at bedside, she has chosen [facility]. CSW will start insurance auth. Auth pending, ICM to follow.   Expected Discharge Plan: Skilled Nursing Facility Barriers to Discharge: Continued Medical Work up               Expected Discharge Plan and Services In-house Referral: Clinical Social Work   Post Acute Care Choice: Resumption of Svcs/PTA Provider Living arrangements for the past 2 months: Single Family Home                 DME Arranged: N/A DME Agency: NA                   Social Drivers of Health (SDOH) Interventions SDOH Screenings   Food Insecurity: No Food Insecurity (10/11/2024)  Housing: Low Risk (10/11/2024)  Transportation Needs: No Transportation Needs (10/11/2024)  Utilities: Not At Risk (10/11/2024)  Social Connections: Socially Isolated (10/11/2024)  Tobacco Use: Medium Risk (10/10/2024)    Readmission Risk Interventions    10/11/2024    2:25 PM 10/07/2024    5:40 PM 10/05/2024    9:23 AM  Readmission Risk Prevention Plan  Transportation Screening Complete Complete Complete  PCP or Specialist Appt within 5-7 Days  Complete Complete  Home Care Screening  Complete Complete  Medication Review (RN CM)  Complete Complete  HRI or Home Care Consult Complete    Social Work Consult for Recovery Care Planning/Counseling Complete    Palliative Care Screening Not Applicable    Medication Review Oceanographer) Complete

## 2024-10-19 NOTE — Plan of Care (Signed)
  Problem: Clinical Measurements: Goal: Ability to maintain clinical measurements within normal limits will improve Outcome: Progressing   Problem: Clinical Measurements: Goal: Diagnostic test results will improve Outcome: Progressing   Problem: Clinical Measurements: Goal: Respiratory complications will improve Outcome: Progressing   Problem: Clinical Measurements: Goal: Cardiovascular complication will be avoided Outcome: Progressing   Problem: Activity: Goal: Risk for activity intolerance will decrease Outcome: Progressing

## 2024-10-20 LAB — BASIC METABOLIC PANEL WITH GFR
Anion gap: 8 (ref 5–15)
BUN: 11 mg/dL (ref 8–23)
CO2: 25 mmol/L (ref 22–32)
Calcium: 9.2 mg/dL (ref 8.9–10.3)
Chloride: 101 mmol/L (ref 98–111)
Creatinine, Ser: 1.05 mg/dL — ABNORMAL HIGH (ref 0.44–1.00)
GFR, Estimated: 55 mL/min — ABNORMAL LOW
Glucose, Bld: 111 mg/dL — ABNORMAL HIGH (ref 70–99)
Potassium: 4.3 mmol/L (ref 3.5–5.1)
Sodium: 134 mmol/L — ABNORMAL LOW (ref 135–145)

## 2024-10-20 MED ORDER — LORAZEPAM 0.5 MG PO TABS: 0.5000 mg | ORAL_TABLET | Freq: Four times a day (QID) | ORAL | 0 refills | Status: DC | PRN

## 2024-10-20 MED ORDER — ENSURE MAX PROTEIN PO LIQD
11.0000 [oz_av] | Freq: Two times a day (BID) | ORAL | Status: DC
  Administered 2024-10-20 – 2024-10-23 (×7): 11 [oz_av] via ORAL
  Filled 2024-10-20 (×7): qty 330

## 2024-10-20 NOTE — Plan of Care (Signed)
" °  Problem: Clinical Measurements: Goal: Respiratory complications will improve Outcome: Progressing   Problem: Clinical Measurements: Goal: Diagnostic test results will improve Outcome: Progressing   Problem: Clinical Measurements: Goal: Cardiovascular complication will be avoided Outcome: Progressing   Problem: Activity: Goal: Risk for activity intolerance will decrease Outcome: Progressing   Problem: Nutrition: Goal: Adequate nutrition will be maintained Outcome: Progressing   "

## 2024-10-20 NOTE — Progress Notes (Signed)
 " PROGRESS NOTE  Shannon Roberts  FMW:[id number] DOB: 04/18/1951 DOA: 10/10/2024 PCP: Okey Carlin Redbird, MD   Brief Narrative: Shannon Roberts is a 73 y.o. female with past medical history significant for HTN, HLD, CKD stage IIIa, hypothyroidism, COPD, SIADH, obesity, anxiety, recent COVID-19 viral infection who presented to [facility] ED on 10/10/2024 via EMS from home with complaints of progressive shortness of breath, worse with exertion.  COVID/flu/influenza negative.  Chest x-ray did not show any active cardiopulmonary disease.  Patient admitted for the management of COPD exacerbation.  Started on IV steroids, bronchodilators.  PCCM also consulted.  On 2 L of oxygen this morning.  PT recommending SNF on discharge.  TOC following.  Clinically improved now, ready for SNF discharge.Insurance declined/P2P done.  TOC following and assisting  Assessment & Plan:  Active Problems:   COPD exacerbation (HCC)   Hyperlipidemia   Anxiety disorder   History of CVA (cerebrovascular accident)   Anemia   Chronic kidney disease, stage 3a (HCC)   Hyponatremia   Hypothyroidism   Class 1 obesity due to excess calories with body mass index (BMI) of 31.0 to 31.9 in adult   CAD (coronary artery disease)   Leg edema, right  Acute hypoxic respiratory failure, POA COPD exacerbation Recent COVID-19 viral infection Patient presenting with progressive shortness of breath, not oxygen dependent at baseline although was recently discharged on home oxygen from recent Covid-19 viral infection.  Has underlying COPD, follows with pulmonology Dr. Kara outpatient.  Patient is afebrile without leukocytosis.  Chest x-ray with no focal consolidation. -- she completed abx course -- Breztri  2 puffs twice daily (substituted for home Trelegy Ellipta ) -- Complete 5-day course of prednisone   -- Continue supplemental oxygen to maintain SpO2 greater than 88%; currently on 2 L nasal cannula with SpO2 98% at rest -Most  likely she might need to continued on oxygen supplementation on discharge.   Elevated D-dimer D-dimer elevated 0.99, but normalizes with age adjustment.  Vascular duplex ultrasound right lower extremity negative for DVT. -- CT angiogram chest : no evidence of PE   HTN CAD HLD -- Atenolol  50 mg p.o. daily -- Isosorbide  mononitrate 30 m p.o. daily -- Plavix  75 mg p.o. daily -- Simvastatin  40 mg p.o. daily   CKD stage IIIa -- Kidney function stable   Hypothyroidism -- Continue levothyroxine  50 mcg p.o. daily   SIADH -- Sodium chloride  1 g p.o. 3 times daily AC -- 1200 mL fluid restriction -Sodium level stable   GERD -- Protonix  40 mg p.o. daily   Anxiety -- Lorazepam  0.5 - 0.75 mg p.o. every 6 hours as needed anxiety   Obesity, class I Body mass index is 30.34 kg/m.    Weakness/debility/deconditioning: -- PT now recommending SNF placement -- TOC following    Nutrition Problem: Increased nutrient needs Etiology: chronic illness (COPD)    DVT prophylaxis:enoxaparin  (LOVENOX ) injection 40 mg Start: 10/12/24 1200 SCDs Start: 10/11/24 0343     Code Status: Full Code  Family Communication: called and discussed with daughter on phone on 12/29  Patient status:Inpatient  Patient is from :home  Anticipated discharge to:SNF  Estimated DC date:whenever possible   Consultants: PCCM  Procedures:None  Antimicrobials:  Anti-infectives (From admission, onward)    Start     Dose/Rate Route Frequency Ordered Stop   10/11/24 1000  doxycycline  (VIBRA -TABS) tablet 100 mg        100 mg Oral Every 12 hours 10/11/24 0342 10/17/24 2152  Subjective: Patient seen and examined at bedside today.  She was getting breathing treatment.  No new change from yesterday.  Remains comfortable.  Sitting on the bed.  No significant wheezing today.  Objective: Vitals:   10/19/24 2023 10/20/24 0428 10/20/24 1252 10/20/24 1337  BP: (!) 145/75 (!) 130/59 116/62   Pulse: 86 80  78   Resp: 20 16 20    Temp: 97.6 F (36.4 C) 97.8 F (36.6 C) 98.1 F (36.7 C)   TempSrc: Oral Oral Oral   SpO2: 100% 100% 100% 100%  Weight:      Height:       No intake or output data in the 24 hours ending 10/20/24 1358  Filed Weights   10/10/24 1736  Weight: 82.7 kg    Examination:      General exam: Overall comfortable, not in distress,pleasant lady HEENT: PERRL Respiratory system: Mildly diminished sounds bilaterally, no expiratory wheezing today Cardiovascular system: S1 & S2 heard, RRR.  Gastrointestinal system: Abdomen is nondistended, soft and nontender. Central nervous system: Alert and oriented Extremities: No edema, no clubbing ,no cyanosis Skin: No rashes, no ulcers,no icterus       Data Reviewed: I have personally reviewed following labs and imaging studies  CBC: Recent Labs  Lab 10/17/24 0359 10/18/24 0347  WBC 13.2* 10.3  HGB 10.7* 10.1*  HCT 32.7* 30.7*  MCV 93.7 95.3  PLT 295 244   Basic Metabolic Panel: Recent Labs  Lab 10/15/24 0337 10/17/24 0359 10/18/24 0347 10/20/24 0413  NA 134* 131* 134* 134*  K 4.6 4.3 4.3 4.3  CL 99 98 102 101  CO2 25 24 24 25   GLUCOSE 117* 118* 111* 111*  BUN 17 19 18 11   CREATININE 1.00 1.12* 1.15* 1.05*  CALCIUM  9.8 9.2 9.1 9.2     Recent Results (from the past 240 hours)  Resp panel by RT-PCR (RSV, Flu A&B, Covid) Anterior Nasal Swab     Status: None   Collection Time: 10/10/24  6:35 PM   Specimen: Anterior Nasal Swab  Result Value Ref Range Status   SARS Coronavirus 2 by RT PCR NEGATIVE NEGATIVE Final    Comment: (NOTE) SARS-CoV-2 target nucleic acids are NOT DETECTED.  The SARS-CoV-2 RNA is generally detectable in upper respiratory specimens during the acute phase of infection. The lowest concentration of SARS-CoV-2 viral copies this assay can detect is 138 copies/mL. A negative result does not preclude SARS-Cov-2 infection and should not be used as the sole basis for treatment or other  patient management decisions. A negative result may occur with  improper specimen collection/handling, submission of specimen other than nasopharyngeal swab, presence of viral mutation(s) within the areas targeted by this assay, and inadequate number of viral copies(<138 copies/mL). A negative result must be combined with clinical observations, patient history, and epidemiological information. The expected result is Negative.  Fact Sheet for Patients:  bloggercourse.com  Fact Sheet for Healthcare Providers:  seriousbroker.it  This test is no t yet approved or cleared by the [place]  FDA and  has been authorized for detection and/or diagnosis of SARS-CoV-2 by FDA under an Emergency Use Authorization (EUA). This EUA will remain  in effect (meaning this test can be used) for the duration of the COVID-19 declaration under Section 564(b)(1) of the Act, 21 U.S.C.section 360bbb-3(b)(1), unless the authorization is terminated  or revoked sooner.       Influenza A by PCR NEGATIVE NEGATIVE Final   Influenza B by PCR NEGATIVE NEGATIVE Final  Comment: (NOTE) The Xpert Xpress SARS-CoV-2/FLU/RSV plus assay is intended as an aid in the diagnosis of influenza from Nasopharyngeal swab specimens and should not be used as a sole basis for treatment. Nasal washings and aspirates are unacceptable for Xpert Xpress SARS-CoV-2/FLU/RSV testing.  Fact Sheet for Patients: bloggercourse.com  Fact Sheet for Healthcare Providers: seriousbroker.it  This test is not yet approved or cleared by the [place]  FDA and has been authorized for detection and/or diagnosis of SARS-CoV-2 by FDA under an Emergency Use Authorization (EUA). This EUA will remain in effect (meaning this test can be used) for the duration of the COVID-19 declaration under Section 564(b)(1) of the Act, 21 U.S.C. section  360bbb-3(b)(1), unless the authorization is terminated or revoked.     Resp Syncytial Virus by PCR NEGATIVE NEGATIVE Final    Comment: (NOTE) Fact Sheet for Patients: bloggercourse.com  Fact Sheet for Healthcare Providers: seriousbroker.it  This test is not yet approved or cleared by the [place]  FDA and has been authorized for detection and/or diagnosis of SARS-CoV-2 by FDA under an Emergency Use Authorization (EUA). This EUA will remain in effect (meaning this test can be used) for the duration of the COVID-19 declaration under Section 564(b)(1) of the Act, 21 U.S.C. section 360bbb-3(b)(1), unless the authorization is terminated or revoked.  Performed at [facility], [address], [place], [place] [address]      Radiology Studies: No results found.   Scheduled Meds:  amLODipine   5 mg Oral Daily   atenolol   50 mg Oral Daily   budesonide -glycopyrrolate -formoterol   2 puff Inhalation BID   clopidogrel   75 mg Oral Daily   DULoxetine   60 mg Oral q AM   enoxaparin  (LOVENOX ) injection  40 mg Subcutaneous Q24H   ferrous sulfate   325 mg Oral Q breakfast   ipratropium-albuterol   3 mL Nebulization TID   isosorbide  mononitrate  30 mg Oral BID   levothyroxine   50 mcg Oral QAC breakfast   melatonin  3 mg Oral QHS   multivitamin with minerals  1 tablet Oral Daily   pantoprazole   40 mg Oral QAC breakfast   polyethylene glycol  17 g Oral Daily   Ensure Max Protein  11 oz Oral BID   senna-docusate  1 tablet Oral BID   simvastatin   40 mg Oral QHS   sodium chloride  flush  3 mL Intravenous Q12H   sodium chloride   1 g Oral TID WC   Continuous Infusions:   LOS: 10 days   Ivonne Mustache, MD [facility] P12/31/2025, 1:58 PM  "

## 2024-10-20 NOTE — Progress Notes (Signed)
" °   10/20/24 1035  Spiritual Encounters  Type of Visit Initial  Care provided to: Patient  Referral source Chaplain team  Reason for visit Advance directives  OnCall Visit No   Chaplain responded to a request for follow up for advanced directive completion. I reviewed the paperwork and shared next steps with the patient, Shannon Roberts.  AC came up and document was witnessed and notarized. A hard copy was put in her file and sent to [facility] and I provided 3 copies and the original to [place] for her own use.  I wished her a pleasant day as I departed.   Carley [place] [facility]  [phone]  "

## 2024-10-20 NOTE — Care Management Important Message (Signed)
 Important Message  Patient Details IM Letter given. Name: Shannon Roberts MRN: [id number] Date of Birth: [birth year]   Important Message Given:  Yes - Medicare IM     Montreal Steidle 10/20/2024, 10:12 AM

## 2024-10-20 NOTE — Plan of Care (Signed)

## 2024-10-20 NOTE — TOC Progression Note (Addendum)
 Transition of Care [facility]) - Progression Note    Patient Details  Name: Shannon Roberts MRN: [id number] Date of Birth: Oct 30, 1950  [facility]) CM/SW Contact  Tawni CHRISTELLA Eva, LCSW Phone Number: 10/20/2024, 9:16 AM  Clinical Narrative:     Pt insurance shara is pending. ICM to follow.   [place]  10:00am  Pt insurance auth went to peer to peer due by 2pm today; [phone] option 5. MD made aware. ICM to follow.  Expected Discharge Plan: Skilled Nursing Facility Barriers to Discharge: Continued Medical Work up               Expected Discharge Plan and Services In-house Referral: Clinical Social Work   Post Acute Care Choice: Resumption of Svcs/PTA Provider Living arrangements for the past 2 months: Single Family Home                 DME Arranged: N/A DME Agency: NA                   Social Drivers of Health (SDOH) Interventions SDOH Screenings   Food Insecurity: No Food Insecurity (10/11/2024)  Housing: Low Risk (10/11/2024)  Transportation Needs: No Transportation Needs (10/11/2024)  Utilities: Not At Risk (10/11/2024)  Social Connections: Socially Isolated (10/11/2024)  Tobacco Use: Medium Risk (10/10/2024)    Readmission Risk Interventions    10/11/2024    2:25 PM 10/07/2024    5:40 PM 10/05/2024    9:23 AM  Readmission Risk Prevention Plan  Transportation Screening Complete Complete Complete  PCP or Specialist Appt within 5-7 Days  Complete Complete  Home Care Screening  Complete Complete  Medication Review (RN CM)  Complete Complete  HRI or Home Care Consult Complete    Social Work Consult for Recovery Care Planning/Counseling Complete    Palliative Care Screening Not Applicable    Medication Review Oceanographer) Complete

## 2024-10-20 NOTE — Progress Notes (Signed)
 Nutrition Follow-up  DOCUMENTATION CODES:   Obesity unspecified  INTERVENTION:   -Continue MVI with minerals daily  -Continue Ensure Plus High Protein po BID, each supplement provides 350 kcal and 20 grams of protein  -Will order Ensure Max in chocolate flavor, provides 150 kcals and 30g protein each.  -COPD Nutrition Therapy handout from [facility] Nutrition Care Manual; attached to AVS/ discharge summary   NUTRITION DIAGNOSIS:   Increased nutrient needs related to chronic illness (COPD) as evidenced by estimated needs.  Ongoing.  GOAL:   Patient will meet greater than or equal to 90% of their needs  Progressing.  MONITOR:   PO intake, Supplement acceptance  ASSESSMENT:   73 y.o. female with past medical history significant for HTN, HLD, CKD stage IIIa, hypothyroidism, COPD, SIADH, obesity, anxiety, recent COVID-19 viral infection who presented  with complaints of progressive shortness of breath, worse with exertion Patient admitted with COPD exacerbation.   Patient currently consuming 50-80% of meals. Not drinking Ensure as we only have vanilla in stock, prefers chocolate. Will order Ensure Max in chocolate.  Admission weight: 182 lbs No other weights this admission.  Medications: Ferrous sulfate , Multivitamin with minerals daily, Miralax , Senokot   Labs reviewed: Low sodium   Diet Order:   Diet Order             Diet regular Room service appropriate? Yes; Fluid consistency: Thin  Diet effective now                   EDUCATION NEEDS:   Education needs have been addressed  Skin:  Skin Assessment: Reviewed RN Assessment  Last BM:  12/29  Height:   Ht Readings from Last 1 Encounters:  10/10/24 5' 5 (1.651 m)    Weight:   Wt Readings from Last 1 Encounters:  10/10/24 82.7 kg    BMI:  Body mass index is 30.34 kg/m.  Estimated Nutritional Needs:   Kcal:  1700-1900  Protein:  90-105 grams  Fluid:  1.7-1.9 L   Morna Lee, MS, RD,  LDN Inpatient Clinical Dietitian Contact via Secure chat

## 2024-10-20 NOTE — Progress Notes (Signed)
 PT Cancellation Note  Patient Details Name: ALAYSSA FLINCHUM MRN: [id number] DOB: 1951-05-20   Cancelled Treatment:    Reason Eval/Treat Not Completed: Other (comment) Pt with TOC in room discussing d/c recommendations, SNF denial and appeal process. Pt wanting to relay information to daughter to assist with decision making. TOC helping explain appeal process with pt request to appeal. PT will attempt therapy at later date.  Isaiah DEL. Eiden Bagot, PT, DPT   [employer] 10/20/2024, 3:21 PM

## 2024-10-20 NOTE — TOC Progression Note (Signed)
 Transition of Care [facility]) - Progression Note    Patient Details  Name: Shannon Roberts MRN: [id number] Date of Birth: August 18, 1951  [facility] CM/SW Contact  Tawni CHRISTELLA Eva, LCSW Phone Number: 10/20/2024, 2:41 PM  Clinical Narrative:     CSW received call from pt's insurance , pt's SNF [facility] was denied. CSW has provided pt with information for [employer] appeal. Pt reports she would like to appeal the decision. ICM to follow.     Expected Discharge Plan: Skilled Nursing Facility Barriers to Discharge: Continued Medical Work up               Expected Discharge Plan and Services In-house Referral: Clinical Social Work   Post Acute Care Choice: Resumption of Svcs/PTA Provider Living arrangements for the past 2 months: Single Family Home                 DME Arranged: N/A DME Agency: NA                   Social Drivers of Health (SDOH) Interventions SDOH Screenings   Food Insecurity: No Food Insecurity (10/11/2024)  Housing: Low Risk (10/11/2024)  Transportation Needs: No Transportation Needs (10/11/2024)  Utilities: Not At Risk (10/11/2024)  Social Connections: Socially Isolated (10/11/2024)  Tobacco Use: Medium Risk (10/10/2024)    Readmission Risk Interventions    10/11/2024    2:25 PM 10/07/2024    5:40 PM 10/05/2024    9:23 AM  Readmission Risk Prevention Plan  Transportation Screening Complete Complete Complete  PCP or Specialist Appt within 5-7 Days  Complete Complete  Home Care Screening  Complete Complete  Medication Review (RN CM)  Complete Complete  HRI or Home Care Consult Complete    Social Work Consult for Recovery Care Planning/Counseling Complete    Palliative Care Screening Not Applicable    Medication Review [employer]) Complete

## 2024-10-21 DIAGNOSIS — R638 Other symptoms and signs concerning food and fluid intake: Secondary | ICD-10-CM | POA: Insufficient documentation

## 2024-10-21 DIAGNOSIS — Z8616 Personal history of COVID-19: Secondary | ICD-10-CM | POA: Diagnosis not present

## 2024-10-21 DIAGNOSIS — J9601 Acute respiratory failure with hypoxia: Secondary | ICD-10-CM | POA: Diagnosis not present

## 2024-10-21 DIAGNOSIS — R5381 Other malaise: Secondary | ICD-10-CM | POA: Insufficient documentation

## 2024-10-21 DIAGNOSIS — Z7951 Long term (current) use of inhaled steroids: Secondary | ICD-10-CM | POA: Diagnosis not present

## 2024-10-21 DIAGNOSIS — J441 Chronic obstructive pulmonary disease with (acute) exacerbation: Secondary | ICD-10-CM | POA: Diagnosis not present

## 2024-10-21 MED ORDER — ORAL CARE MOUTH RINSE: 15.0000 mL | OROMUCOSAL | Status: DC | PRN

## 2024-10-21 MED ORDER — SODIUM CHLORIDE 3 % IN NEBU
4.0000 mL | INHALATION_SOLUTION | Freq: Two times a day (BID) | RESPIRATORY_TRACT | Status: DC
  Administered 2024-10-21 – 2024-10-23 (×4): 4 mL via RESPIRATORY_TRACT
  Filled 2024-10-21 (×5): qty 4

## 2024-10-21 NOTE — Assessment & Plan Note (Addendum)
 Recent admission for hyponatremia associated with volume depletion and SIADH Continue sodium chloride  1 g p.o. 3 times daily AC Continue 1200 mL fluid restriction Sodium level stable

## 2024-10-21 NOTE — Plan of Care (Incomplete)
" °  Problem: Clinical Measurements: Goal: Respiratory complications will improve Outcome: Progressing   Problem: Activity: Goal: Risk for activity intolerance will decrease Outcome: Progressing   Problem: Education: Goal: Knowledge of General Education information will improve Description: Including pain rating scale, medication(s)/side effects and non-pharmacologic comfort measures Outcome: Adequate for Discharge   Problem: Health Behavior/Discharge Planning: Goal: Ability to manage health-related needs will improve Outcome: Adequate for Discharge   Problem: Clinical Measurements: Goal: Ability to maintain clinical measurements within normal limits will improve Outcome: Adequate for Discharge Goal: Will remain free from infection Outcome: Adequate for Discharge Goal: Diagnostic test results will improve Outcome: Adequate for Discharge Goal: Cardiovascular complication will be avoided Outcome: Adequate for Discharge   Problem: Nutrition: Goal: Adequate nutrition will be maintained Outcome: Adequate for Discharge   Problem: Coping: Goal: Level of anxiety will decrease Outcome: Adequate for Discharge   Problem: Elimination: Goal: Will not experience complications related to bowel motility Outcome: Adequate for Discharge Goal: Will not experience complications related to urinary retention Outcome: Adequate for Discharge   "

## 2024-10-21 NOTE — Assessment & Plan Note (Addendum)
 Nutrition Problem: Increased nutrient needs Etiology: chronic illness (COPD) Signs/Symptoms: estimated needs Interventions: Ensure Enlive (each supplement provides 350kcal and 20 grams of protein), MVI, Liberalize Diet

## 2024-10-21 NOTE — Assessment & Plan Note (Addendum)
"  Continue Synthroid            "

## 2024-10-21 NOTE — Assessment & Plan Note (Signed)
"   Continue PPI  "

## 2024-10-21 NOTE — Progress Notes (Signed)
 " Progress Note   Patient: Shannon Roberts FMW:[id number] DOB: May 20, 1951 DOA: 10/10/2024     11 DOS: the patient was seen and examined on 10/21/2024   Brief hospital course: 74yo with h/o HTN, HLD, stage 3a CKD, COPD, hypothyroidism, anxiety, and recent COVID-19 infection who presented on 12/21 with SOB.  Admitted for COPD exacerbation, treated with steroids and bronchodilators.  Pulmonology consulted.  PT/OT recommended SNF but insurance denied after peer to peer.  Family is appealing.    Assessment & Plan COPD exacerbation [facility]) Patient presenting with progressive shortness of breath after recent discharged on home oxygen following Covid-19 viral infection Has underlying COPD, follows with pulmonology Dr. Kara outpatient Patient is afebrile without leukocytosis Chest x-ray with no focal consolidation Completed antibiotics and prednisone  Continue Breztri  2 puffs twice daily (substituted for home Trelegy Ellipta ) Continue supplemental oxygen to maintain SpO2 greater than 88%; currently on 2 L nasal cannula with SpO2 98% at rest Continue hypertonic nebs as needed for airway clearance with IS/flutter valve Hyperlipidemia Continue simvastatin  Anxiety disorder Continue Ativan  as needed Continue duloxetine  History of CVA (cerebrovascular accident) Continue Plavix , Imdur  Anemia Normocytic anemia, likely associated with chronic disease Appears to be generally stable at this time Chronic kidney disease, stage 3a (HCC) Appears to be stable at this time Attempt to avoid nephrotoxic medications Hyponatremia Recent admission for hyponatremia associated with volume depletion and SIADH Continue sodium chloride  1 g p.o. 3 times daily AC Continue 1200 mL fluid restriction Sodium level stable Hypothyroidism Continue Synthroid  CAD (coronary artery disease) No c/o CP Continue Plavix , atenolol , Imdur  Leg edema, right D-dimer elevated 0.99, but normalizes with age adjustment Vascular duplex  ultrasound right lower extremity negative for DVT CT angiogram chest : no evidence of PE Physical deconditioning Remains deconditioned following recent COVID infection Still with some ambulatory SOB, which is likely to improve over time PT/OT consulting and recommended STR but insurance has denied authorization She is appealing PT/OT to continue to work with her in case she needs to return home with home health Increased nutritional needs Nutrition Problem: Increased nutrient needs Etiology: chronic illness (COPD) Signs/Symptoms: estimated needs Interventions: Ensure Enlive (each supplement provides 350kcal and 20 grams of protein), MVI, Liberalize Diet GERD (gastroesophageal reflux disease) Continue PPI Essential hypertension Continue amlodipine , atenolol  Lisinopril  is on hold for at least 1 month, per nephrology Pre-diabetes Diet controlled Glucose in the low 100s Class 1 obesity due to excess calories with body mass index (BMI) of 31.0 to 31.9 in adult Body mass index is 30.34 kg/m.SABRA  Weight loss should be encouraged Outpatient PCP/bariatric medicine f/u encouraged Significantly low or high BMI is associated with higher medical risk including morbidity and mortality       Consultants: Pulmonology PT OT ICM team Dietician  Procedures: RLE DVT US  12/22  Antibiotics: None  30 Day Unplanned Readmission Risk Score    Flowsheet Row ED to Hosp-Admission (Current) from 10/10/2024 in [place] 4TH FLOOR PROGRESSIVE CARE AND UROLOGY  30 Day Unplanned Readmission Risk Score (%) 25.64 Filed at 10/21/2024 0400    This score is the patient's risk of an unplanned readmission within 30 days of being discharged (0 -100%). The score is based on dignosis, age, lab data, medications, orders, and past utilization.   Low:  0-14.9   Medium: 15-21.9   High: 22-29.9   Extreme: 30 and above           Subjective: Feeling better.  Unhappy with me because I discharged her too soon  last  time and she had to return.  Initially refused to see me because she doesn't trust me but warmed and is now willing to have me continue to care for her.   Objective: Vitals:   10/21/24 0444 10/21/24 0957  BP: 136/88   Pulse: 79   Resp: 20   Temp: 97.9 F (36.6 C)   SpO2: 99% 98%    Intake/Output Summary (Last 24 hours) at 10/21/2024 1351 Last data filed at 10/20/2024 2300 Gross per 24 hour  Intake 340 ml  Output --  Net 340 ml   Filed Weights   10/10/24 1736  Weight: 82.7 kg    Exam:  General:  Appears calm and comfortable and is in NAD, on bedside commode on [place] O2 Eyes:  normal lids, iris ENT:  grossly normal hearing, lips & tongue, mmm Cardiovascular:  RRR. No LE edema.  Respiratory:   CTA bilaterally with no wheezes/rales/rhonchi.  Normal respiratory effort. Abdomen:  soft, NT, ND Skin:  no rash or induration seen on limited exam Musculoskeletal:  grossly normal tone BUE/BLE, good ROM, no bony abnormality Psychiatric:  grossly normal mood and affect, speech fluent and appropriate, AOx3 Neurologic:  CN 2-12 grossly intact, moves all extremities in coordinated fashion  Data Reviewed: I have reviewed the patient's lab results since admission.  Pertinent labs for today include:   None today     Family Communication: None present  Mobility: PT/OT Consulted and are recommending - Skilled Nursing-Short Term Rehab (<3 Hours/Day)10/18/2024 1341    Code Status: Full Code   Disposition: Status is: Inpatient Remains inpatient appropriate because: awaiting placement     Time spent: 50 minutes  Unresulted Labs (From admission, onward)     Start     Ordered   Unscheduled  CBC  Tomorrow morning,   R        10/21/24 1351   Unscheduled  Basic metabolic panel with GFR  Tomorrow morning,   R        10/21/24 1351             Author: Delon Herald, MD 10/21/2024 1:51 PM  For on call review [email or web address].            "

## 2024-10-21 NOTE — Assessment & Plan Note (Signed)
 Normocytic anemia, likely associated with chronic disease Appears to be generally stable at this time

## 2024-10-21 NOTE — Assessment & Plan Note (Signed)
 Continue amlodipine , atenolol  Lisinopril  is on hold for at least 1 month, per nephrology

## 2024-10-21 NOTE — Assessment & Plan Note (Addendum)
 Continue Ativan  as needed Continue duloxetine 

## 2024-10-21 NOTE — Hospital Course (Signed)
 73yo with h/o HTN, HLD, stage 3a CKD, COPD, hypothyroidism, anxiety, and recent COVID-19 infection who presented on 12/21 with SOB.  Admitted for COPD exacerbation, treated with steroids and bronchodilators.  Pulmonology consulted.  PT/OT recommended SNF but insurance denied after peer to peer.  Family is appealing.

## 2024-10-21 NOTE — Plan of Care (Signed)
  Problem: Education: Goal: Knowledge of General Education information will improve Description: Including pain rating scale, medication(s)/side effects and non-pharmacologic comfort measures Outcome: Progressing   Problem: Clinical Measurements: Goal: Respiratory complications will improve Outcome: Progressing Goal: Cardiovascular complication will be avoided Outcome: Progressing   Problem: Nutrition: Goal: Adequate nutrition will be maintained Outcome: Progressing   Problem: Elimination: Goal: Will not experience complications related to bowel motility Outcome: Progressing Goal: Will not experience complications related to urinary retention Outcome: Progressing

## 2024-10-21 NOTE — Assessment & Plan Note (Addendum)
"   Continue simvastatin          "

## 2024-10-21 NOTE — Assessment & Plan Note (Addendum)
-  Appears to be stable at this time -Attempt to avoid nephrotoxic medications

## 2024-10-21 NOTE — Assessment & Plan Note (Addendum)
 Patient presenting with progressive shortness of breath after recent discharged on home oxygen following Covid-19 viral infection Has underlying COPD, follows with pulmonology Dr. Kara outpatient Patient is afebrile without leukocytosis Chest x-ray with no focal consolidation Completed antibiotics and prednisone  Continue Breztri  2 puffs twice daily (substituted for home Trelegy Ellipta ) Continue supplemental oxygen to maintain SpO2 greater than 88%; currently on 2 L nasal cannula with SpO2 98% at rest Continue hypertonic nebs as needed for airway clearance with IS/flutter valve

## 2024-10-21 NOTE — Assessment & Plan Note (Addendum)
 Body mass index is 30.34 kg/m.SABRA  Weight loss should be encouraged Outpatient PCP/bariatric medicine f/u encouraged Significantly low or high BMI is associated with higher medical risk including morbidity and mortality

## 2024-10-21 NOTE — Assessment & Plan Note (Signed)
 Diet controlled Glucose in the low 100s

## 2024-10-21 NOTE — Assessment & Plan Note (Addendum)
 Remains deconditioned following recent COVID infection Still with some ambulatory SOB, which is likely to improve over time PT/OT consulting and recommended STR but insurance has denied authorization She is appealing PT/OT to continue to work with her in case she needs to return home with home health

## 2024-10-21 NOTE — Assessment & Plan Note (Signed)
 Continue Plavix , Imdur 

## 2024-10-21 NOTE — Progress Notes (Signed)
 Physical Therapy Treatment Patient Details Name: Shannon Roberts MRN: [id number] DOB: [birth year] Today's Date: 10/21/2024   History of Present Illness Pt is a 74 y/o female admitted for COPD exacerbation, acute resp failure PMH: COPD, hyponatremia, GERD, hypertension, HLD, anxiety, CVA, CKD, hypothyroidism, CAD, falls, shoulder surgery    PT Comments  Patient progressing with distance with ambulation though standing rest break needed.  She voices concern for home alone and feels cannot rely on her son or daughter due to proximity, and tendency.  She is anxious to be alone and not able to breathe as the oxygen is new for her.  She was not willing to attempt stair negotiation today (has 2 steps to enter her home) though was able to progress distance from last session.  Continue to feel she would transition better through rehab since has had readmission recently.  PT will continue to follow.  She is hopeful insurance appeal will go through.     If plan is discharge home, recommend the following: Assist for transportation;Help with stairs or ramp for entrance;A little help with bathing/dressing/bathroom   Can travel by private vehicle     Yes  Equipment Recommendations  None recommended by PT    Recommendations for Other Services       Precautions / Restrictions Precautions Precautions: Fall Recall of Precautions/Restrictions: Intact Precaution/Restrictions Comments: monitor O2     Mobility  Bed Mobility Overal bed mobility: Modified Independent             General bed mobility comments: slow to rise, sits to catch her breath    Transfers Overall transfer level: Needs assistance Equipment used: None Transfers: Sit to/from Stand Sit to Stand: Contact guard assist           General transfer comment: for balance with transition to rollator    Ambulation/Gait   Gait Distance (Feet): 70 Feet (&120) Assistive device: Rollator (4 wheels) Gait Pattern/deviations: Step-to  pattern, Step-through pattern, Decreased stride length, Shuffle, Trunk flexed       General Gait Details: slow pace, stopped for seated rest, then walked further trying to push herself to do a little more, one standing rest during last 120'   Stairs             Wheelchair Mobility     Tilt Bed    Modified Rankin (Stroke Patients Only)       Balance Overall balance assessment: Needs assistance, History of Falls Sitting-balance support: Feet supported Sitting balance-Leahy Scale: Good     Standing balance support: During functional activity, Bilateral upper extremity supported, Reliant on assistive device for balance Standing balance-Leahy Scale: Fair Standing balance comment: transition to RW after standing so static balance without UE support, needs RW for ambulation                            Communication Communication Communication: No apparent difficulties  Cognition Arousal: Alert Behavior During Therapy: WFL for tasks assessed/performed   PT - Cognitive impairments: No apparent impairments                         Following commands: Intact      Cueing Cueing Techniques: Verbal cues  Exercises      General Comments General comments (skin integrity, edema, etc.): on 2L O2 throughout SpO2 99% throughout; feels SOB with mobility and discussed energy conservation for ADL/IADL's at home and pt able to state  some modifications she has made though fearful of home alone while on O2 as it is a new normal.  Feels son is unable to assist and daughter works and has small children at home      Pertinent Vitals/Pain Pain Assessment Pain Assessment: Faces Faces Pain Scale: Hurts little more Pain Location: R shoulder/bicep area premorbid, was going to therapy per pt Pain Descriptors / Indicators: Discomfort, Aching Pain Intervention(s): Monitored during session    Home Living                          Prior Function             PT Goals (current goals can now be found in the care plan section) Progress towards PT goals: Progressing toward goals    Frequency    Min 2X/week      PT Plan      Co-evaluation              AM-PAC PT 6 Clicks Mobility   Outcome Measure  Help needed turning from your back to your side while in a flat bed without using bedrails?: None Help needed moving from lying on your back to sitting on the side of a flat bed without using bedrails?: A Little Help needed moving to and from a bed to a chair (including a wheelchair)?: A Little Help needed standing up from a chair using your arms (e.g., wheelchair or bedside chair)?: A Little Help needed to walk in hospital room?: A Little Help needed climbing 3-5 steps with a railing? : A Little 6 Click Score: 19    End of Session Equipment Utilized During Treatment: Oxygen;Gait belt Activity Tolerance: Patient tolerated treatment well Patient left: in bed;with family/visitor present   PT Visit Diagnosis: History of falling (Z91.81);Unsteadiness on feet (R26.81);Difficulty in walking, not elsewhere classified (R26.2)     Time: 8574-8548 PT Time Calculation (min) (ACUTE ONLY): 26 min  Charges:    $Gait Training: 8-22 mins $Self Care/Home Management: 8-22 PT General Charges $$ ACUTE PT VISIT: 1 Visit                     Shannon Roberts, PT Acute Rehabilitation Services Office:[phone] 10/21/2024    Shannon Roberts 10/21/2024, 4:11 PM

## 2024-10-21 NOTE — Assessment & Plan Note (Addendum)
 D-dimer elevated 0.99, but normalizes with age adjustment Vascular duplex ultrasound right lower extremity negative for DVT CT angiogram chest : no evidence of PE

## 2024-10-21 NOTE — Assessment & Plan Note (Addendum)
 No c/o CP Continue Plavix , atenolol , Imdur 

## 2024-10-21 NOTE — Progress Notes (Signed)
 "  NAME:  Shannon Roberts, MRN:  [id number], DOB:  1951-04-27, LOS: 11 ADMISSION DATE:  10/10/2024, CONSULTATION DATE:  10/16/24 REFERRING MD:  TRH CHIEF COMPLAINT:  COPD exacerbation   History of Present Illness:  37F with history of HTN, HLD, CKD stage IIIa, hypothyroidism, COPD, SIADH, obesity, anxiety, recent COVID-19 viral infection who presented to [facility] ED on 10/10/2024 via EMS from home with complaints of progressive shortness of breath, worse with exertion. She was admitted for COPD exacerbation. She has been evaluated by PT and SNF has been recommended. CTA Chest done today to evaluate for PE and it was negative for PE.   She is being treated with doxycycline  and steroids along with breztri  inhaler and as needed nebulizer treatments. She reports feeling better today, just very weak overall.   PCCM consulted for COPD exacerbation.  Pertinent  Medical History   Past Medical History:  Diagnosis Date   Allergic rhinitis    Anemia    per hosp. - recently- pt. told that she is anemic   Anxiety    Colitis    COPD (chronic obstructive pulmonary disease) (HCC)    no o2   Depression    Diverticulosis    Female bladder prolapse    Fibroids    GERD (gastroesophageal reflux disease)    Heart murmur    Hx of adenomatous colonic polyps 08/26/2019   Hyperlipemia    Hypertension    IBS (irritable bowel syndrome)    Insomnia    Lip abscess 04/05/2016   Mitral valve prolapse    Peripheral vascular disease    Pre-diabetes 08/03/2019   Diet controlled     Right bundle branch block    Sciatic nerve disease    Shortness of breath    Stroke [facility]) March 2015    Significant Hospital Events: Including procedures, antibiotic start and stop dates in addition to other pertinent events     Interim History / Subjective:   Patient is pending discharge to SNF Discussed about going home based on updated PT eval She lives at home alone and is concerned about going home at this  time with oxygen as this is new  Objective    Blood pressure 136/88, pulse 79, temperature 97.9 F (36.6 C), temperature source Oral, resp. rate 20, height 5' 5 (1.651 m), weight 82.7 kg, SpO2 98%.        Intake/Output Summary (Last 24 hours) at 10/21/2024 1338 Last data filed at 10/20/2024 2300 Gross per 24 hour  Intake 340 ml  Output --  Net 340 ml   Filed Weights   10/10/24 1736  Weight: 82.7 kg    Examination: General: elderly woman, no distress HENT: [place]/AT, moist mucous membranes Lungs: no wheezing, diminished air movement Cardiovascular: rrr, no murmurs Abdomen: soft, non-distended, non-tender Extremities: warm, no edema Neuro: alert, oriented, moving all extremities GU: n/a  Resolved problem list   Assessment and Plan  Acute Hypoxemic respiratory Failure COPD Exacerbation Recent Covid 19 Infection Deconditioning  - continue breztri  inhaler 2 puffs twice daily - PRN duonebs - completed doxycycline  and steroid courses - Use hypertonic nebs for airway clearance with flutter valve/IS - can discharge her with script to continue hypertonic nebs as needed - agree with plan to discharge to SNF as phase 1 of her rehab plans if she qualifies per PT eval and then after follow up in pulmonary clinic will arrange for her to go to pulmonary rehab as phase 2.  PCCM will continue  to follow   Labs   CBC: Recent Labs  Lab 10/17/24 0359 10/18/24 0347  WBC 13.2* 10.3  HGB 10.7* 10.1*  HCT 32.7* 30.7*  MCV 93.7 95.3  PLT 295 244    Basic Metabolic Panel: Recent Labs  Lab 10/15/24 0337 10/17/24 0359 10/18/24 0347 10/20/24 0413  NA 134* 131* 134* 134*  K 4.6 4.3 4.3 4.3  CL 99 98 102 101  CO2 25 24 24 25   GLUCOSE 117* 118* 111* 111*  BUN 17 19 18 11   CREATININE 1.00 1.12* 1.15* 1.05*  CALCIUM  9.8 9.2 9.1 9.2   GFR: Estimated Creatinine Clearance: 49.9 mL/min (A) (by C-G formula based on SCr of 1.05 mg/dL (H)). Recent Labs  Lab 10/17/24 0359  10/18/24 0347  WBC 13.2* 10.3    Liver Function Tests: No results for input(s): AST, ALT, ALKPHOS, BILITOT, PROT, ALBUMIN  in the last 168 hours.  No results for input(s): LIPASE, AMYLASE in the last 168 hours. No results for input(s): AMMONIA in the last 168 hours.  ABG    Component Value Date/Time   HCO3 23.6 10/11/2024 0020   TCO2 21 (L) 10/10/2024 2021   ACIDBASEDEF 1.2 10/11/2024 0020   O2SAT 84.7 10/11/2024 0020     Coagulation Profile: No results for input(s): INR, PROTIME in the last 168 hours.  Cardiac Enzymes: No results for input(s): CKTOTAL, CKMB, CKMBINDEX, TROPONINI in the last 168 hours.  HbA1C: Hgb A1c MFr Bld  Date/Time Value Ref Range Status  01/02/2014 10:55 PM 6.0 (H) <5.7 % Final    Comment:    (NOTE)                                                                       According to the ADA Clinical Practice Recommendations for 2011, when HbA1c is used as a screening test:  >=6.5%   Diagnostic of Diabetes Mellitus           (if abnormal result is confirmed) 5.7-6.4%   Increased risk of developing Diabetes Mellitus References:Diagnosis and Classification of Diabetes Mellitus,Diabetes Care,2011,34(Suppl 1):S62-S69 and Standards of Medical Care in         Diabetes - 2011,Diabetes Care,2011,34 (Suppl 1):S11-S61.    CBG: No results for input(s): GLUCAP in the last 168 hours.     Critical care time: n/a    Dorn Chill, MD [place] [facility] Office: [phone]   See Amion for personal pager PCCM on call pager [phone] until 7pm. Please call Elink 7p-7a. [phone]          "

## 2024-10-22 DIAGNOSIS — J441 Chronic obstructive pulmonary disease with (acute) exacerbation: Secondary | ICD-10-CM | POA: Diagnosis not present

## 2024-10-22 DIAGNOSIS — Z7951 Long term (current) use of inhaled steroids: Secondary | ICD-10-CM | POA: Diagnosis not present

## 2024-10-22 DIAGNOSIS — J9601 Acute respiratory failure with hypoxia: Secondary | ICD-10-CM | POA: Diagnosis not present

## 2024-10-22 DIAGNOSIS — Z8616 Personal history of COVID-19: Secondary | ICD-10-CM | POA: Diagnosis not present

## 2024-10-22 LAB — BASIC METABOLIC PANEL WITH GFR
Anion gap: 11 (ref 5–15)
BUN: 17 mg/dL (ref 8–23)
CO2: 25 mmol/L (ref 22–32)
Calcium: 9.9 mg/dL (ref 8.9–10.3)
Chloride: 99 mmol/L (ref 98–111)
Creatinine, Ser: 1.13 mg/dL — ABNORMAL HIGH (ref 0.44–1.00)
GFR, Estimated: 51 mL/min — ABNORMAL LOW
Glucose, Bld: 128 mg/dL — ABNORMAL HIGH (ref 70–99)
Potassium: 4.3 mmol/L (ref 3.5–5.1)
Sodium: 134 mmol/L — ABNORMAL LOW (ref 135–145)

## 2024-10-22 LAB — CBC
HCT: 28.5 % — ABNORMAL LOW (ref 36.0–46.0)
Hemoglobin: 9.3 g/dL — ABNORMAL LOW (ref 12.0–15.0)
MCH: 31.5 pg (ref 26.0–34.0)
MCHC: 32.6 g/dL (ref 30.0–36.0)
MCV: 96.6 fL (ref 80.0–100.0)
Platelets: 203 K/uL (ref 150–400)
RBC: 2.95 MIL/uL — ABNORMAL LOW (ref 3.87–5.11)
RDW: 13 % (ref 11.5–15.5)
WBC: 6.9 K/uL (ref 4.0–10.5)
nRBC: 0 % (ref 0.0–0.2)

## 2024-10-22 MED ORDER — GUAIFENESIN-DM 100-10 MG/5ML PO SYRP
5.0000 mL | ORAL_SOLUTION | ORAL | Status: DC | PRN
  Administered 2024-10-22 (×4): 5 mL via ORAL
  Filled 2024-10-22 (×4): qty 10

## 2024-10-22 NOTE — Progress Notes (Signed)
 "  NAME:  Shannon Roberts, MRN:  [id number], DOB:  1951/08/03, LOS: 12 ADMISSION DATE:  10/10/2024, CONSULTATION DATE:  10/16/24 REFERRING MD:  TRH CHIEF COMPLAINT:  COPD exacerbation   History of Present Illness:  73F with history of HTN, HLD, CKD stage IIIa, hypothyroidism, COPD, SIADH, obesity, anxiety, recent COVID-19 viral infection who presented to [facility] ED on 10/10/2024 via EMS from home with complaints of progressive shortness of breath, worse with exertion. She was admitted for COPD exacerbation. She has been evaluated by PT and SNF has been recommended. CTA Chest done today to evaluate for PE and it was negative for PE.   She is being treated with doxycycline  and steroids along with breztri  inhaler and as needed nebulizer treatments. She reports feeling better today, just very weak overall.   PCCM consulted for COPD exacerbation.  Pertinent  Medical History   Past Medical History:  Diagnosis Date   Allergic rhinitis    Anemia    per hosp. - recently- pt. told that she is anemic   Anxiety    Colitis    COPD (chronic obstructive pulmonary disease) (HCC)    no o2   Depression    Diverticulosis    Female bladder prolapse    Fibroids    GERD (gastroesophageal reflux disease)    Heart murmur    Hx of adenomatous colonic polyps 08/26/2019   Hyperlipemia    Hypertension    IBS (irritable bowel syndrome)    Insomnia    Lip abscess 04/05/2016   Mitral valve prolapse    Peripheral vascular disease    Pre-diabetes 08/03/2019   Diet controlled     Right bundle branch block    Sciatic nerve disease    Shortness of breath    Stroke [facility]) March 2015    Significant Hospital Events: Including procedures, antibiotic start and stop dates in addition to other pertinent events     Interim History / Subjective:   Patient had issues with coughing overnight Otherwise doing ok. Worked with PT yesterday.  Objective    Blood pressure 123/60, pulse 78, temperature  98.6 F (37 C), temperature source Oral, resp. rate (!) 22, height 5' 5 (1.651 m), weight 82.7 kg, SpO2 99%.        Intake/Output Summary (Last 24 hours) at 10/22/2024 1551 Last data filed at 10/22/2024 1108 Gross per 24 hour  Intake 480 ml  Output --  Net 480 ml   Filed Weights   10/10/24 1736  Weight: 82.7 kg    Examination: General: elderly woman, no distress HENT: [place]/AT, moist mucous membranes Lungs: no wheezing, diminished air movement Cardiovascular: rrr, no murmurs Abdomen: soft, non-distended, non-tender Extremities: warm, no edema Neuro: alert, oriented, moving all extremities GU: n/a  Resolved problem list   Assessment and Plan  Acute Hypoxemic respiratory Failure COPD Exacerbation Recent Covid 19 Infection Deconditioning  - continue breztri  inhaler 2 puffs twice daily - PRN duonebs - completed doxycycline  and steroid courses - Use hypertonic nebs for airway clearance with flutter valve/IS - can discharge her with script to continue hypertonic nebs as needed - Awaiting SNF placement vs going home with home health - will need referral to pulmonary rehab at follow up  PCCM will sign off.   Labs   CBC: Recent Labs  Lab 10/17/24 0359 10/18/24 0347 10/22/24 0335  WBC 13.2* 10.3 6.9  HGB 10.7* 10.1* 9.3*  HCT 32.7* 30.7* 28.5*  MCV 93.7 95.3 96.6  PLT 295 244 203  Basic Metabolic Panel: Recent Labs  Lab 10/17/24 0359 10/18/24 0347 10/20/24 0413 10/22/24 0335  NA 131* 134* 134* 134*  K 4.3 4.3 4.3 4.3  CL 98 102 101 99  CO2 24 24 25 25   GLUCOSE 118* 111* 111* 128*  BUN 19 18 11 17   CREATININE 1.12* 1.15* 1.05* 1.13*  CALCIUM  9.2 9.1 9.2 9.9   GFR: Estimated Creatinine Clearance: 46.4 mL/min (A) (by C-G formula based on SCr of 1.13 mg/dL (H)). Recent Labs  Lab 10/17/24 0359 10/18/24 0347 10/22/24 0335  WBC 13.2* 10.3 6.9    Liver Function Tests: No results for input(s): AST, ALT, ALKPHOS, BILITOT, PROT, ALBUMIN  in the  last 168 hours.  No results for input(s): LIPASE, AMYLASE in the last 168 hours. No results for input(s): AMMONIA in the last 168 hours.  ABG    Component Value Date/Time   HCO3 23.6 10/11/2024 0020   TCO2 21 (L) 10/10/2024 2021   ACIDBASEDEF 1.2 10/11/2024 0020   O2SAT 84.7 10/11/2024 0020     Coagulation Profile: No results for input(s): INR, PROTIME in the last 168 hours.  Cardiac Enzymes: No results for input(s): CKTOTAL, CKMB, CKMBINDEX, TROPONINI in the last 168 hours.  HbA1C: Hgb A1c MFr Bld  Date/Time Value Ref Range Status  01/02/2014 10:55 PM 6.0 (H) <5.7 % Final    Comment:    (NOTE)                                                                       According to the ADA Clinical Practice Recommendations for 2011, when HbA1c is used as a screening test:  >=6.5%   Diagnostic of Diabetes Mellitus           (if abnormal result is confirmed) 5.7-6.4%   Increased risk of developing Diabetes Mellitus References:Diagnosis and Classification of Diabetes Mellitus,Diabetes Care,2011,34(Suppl 1):S62-S69 and Standards of Medical Care in         Diabetes - 2011,Diabetes Care,2011,34 (Suppl 1):S11-S61.    CBG: No results for input(s): GLUCAP in the last 168 hours.     Critical care time: n/a    Dorn Chill, MD [place] [facility] Office: [phone]   See Amion for personal pager PCCM on call pager [phone] until 7pm. Please call Elink 7p-7a. [phone]          "

## 2024-10-22 NOTE — Assessment & Plan Note (Deleted)
 Continue Plavix , Imdur 

## 2024-10-22 NOTE — Assessment & Plan Note (Addendum)
 Nutrition Problem: Increased nutrient needs Etiology: chronic illness (COPD) Signs/Symptoms: estimated needs Interventions: Ensure Enlive (each supplement provides 350kcal and 20 grams of protein), MVI, Liberalize Diet

## 2024-10-22 NOTE — Progress Notes (Signed)
 " Progress Note   Patient: Shannon Roberts FMW:[id number] DOB: [birth year] DOA: 10/10/2024     12 DOS: the patient was seen and examined on 10/22/2024   Brief hospital course: 73yo with h/o HTN, HLD, stage 3a CKD, COPD, hypothyroidism, anxiety, and recent COVID-19 infection who presented on 12/21 with SOB.  Admitted for COPD exacerbation, treated with steroids and bronchodilators.  Pulmonology consulted.  PT/OT recommended SNF but insurance denied after peer to peer.  Family is appealing.   Assessment & Plan COPD exacerbation [facility]) Patient presented with progressive shortness of breath after recent discharge on home oxygen following Covid-19 viral infection Has underlying COPD, follows with pulmonology Dr. Kara outpatient Patient is afebrile without leukocytosis Chest x-ray with no focal consolidation Completed antibiotics and prednisone  Continue Breztri  2 puffs twice daily (substituted for home Trelegy Ellipta ) Continue supplemental oxygen to maintain SpO2 greater than 88%; currently on 2 L nasal cannula with SpO2 98% at rest Continue hypertonic nebs as needed for airway clearance with IS/flutter valve Medically stable from this standpoint, will need to go home on home O2 Hyperlipidemia Continue simvastatin  Anxiety disorder Continue Ativan  as needed Continue duloxetine  History of CVA (cerebrovascular accident) Continue Plavix , Imdur  Anemia Normocytic anemia, likely associated with chronic disease Appears to be generally stable at this time Chronic kidney disease, stage 3a (HCC) Appears to be stable at this time Attempt to avoid nephrotoxic medications Hyponatremia Recent admission for hyponatremia associated with volume depletion and SIADH Continue sodium chloride  1 g p.o. 3 times daily AC Continue 1200 mL fluid restriction Sodium level stable Hypothyroidism Continue Synthroid  CAD (coronary artery disease) No c/o CP Continue Plavix , atenolol , Imdur  Leg edema, right D-dimer  elevated 0.99, but normalizes with age adjustment Vascular duplex ultrasound right lower extremity negative for DVT CT angiogram chest : no evidence of PE Physical deconditioning Remains deconditioned following recent COVID infection Still with some ambulatory SOB, which is likely to improve over time PT/OT consulting and recommended STR but insurance has denied authorization She is appealing PT/OT to continue to work with her in case she needs to return home with home health but continue to recommend STR at this time Increased nutritional needs Nutrition Problem: Increased nutrient needs Etiology: chronic illness (COPD) Signs/Symptoms: estimated needs Interventions: Ensure Enlive (each supplement provides 350kcal and 20 grams of protein), MVI, Liberalize Diet GERD (gastroesophageal reflux disease) Continue PPI Essential hypertension Continue amlodipine , atenolol  Lisinopril  is on hold for at least 1 month, per nephrology Pre-diabetes Diet controlled Glucose in the low 100s Class 1 obesity due to excess calories with body mass index (BMI) of 31.0 to 31.9 in adult Body mass index is 30.34 kg/m.SABRA  Weight loss should be encouraged Outpatient PCP/bariatric medicine f/u encouraged Significantly low or high BMI is associated with higher medical risk including morbidity and mortality       Consultants: Pulmonology PT OT ICM team Dietician   Procedures: RLE DVT US  12/22   Antibiotics: None  30 Day Unplanned Readmission Risk Score    Flowsheet Row ED to Hosp-Admission (Current) from 10/10/2024 in [facility] 4TH FLOOR PROGRESSIVE CARE AND UROLOGY  30 Day Unplanned Readmission Risk Score (%) 29 Filed at 10/22/2024 1200    This score is the patient's risk of an unplanned readmission within 30 days of being discharged (0 -100%). The score is based on dignosis, age, lab data, medications, orders, and past utilization.   Low:  0-14.9   Medium: 15-21.9   High: 22-29.9   Extreme: 30  and above  Subjective: Feeling ok.  Nervous about going home and prefers STR if possible.   Objective: Vitals:   10/22/24 0518 10/22/24 0842  BP: 127/60   Pulse: 71   Resp:    Temp: (!) 97.5 F (36.4 C)   SpO2: 100% 99%    Intake/Output Summary (Last 24 hours) at 10/22/2024 1339 Last data filed at 10/22/2024 1108 Gross per 24 hour  Intake 840 ml  Output --  Net 840 ml   Filed Weights   10/10/24 1736  Weight: 82.7 kg    Exam:  General:  Appears calm and comfortable and is in NAD, stable on RA Eyes:  normal lids, iris ENT:  grossly normal hearing, lips & tongue, mmm Cardiovascular:  RRR. No LE edema.  Respiratory:   CTA bilaterally with no wheezes/rales/rhonchi.  Normal respiratory effort. Abdomen:  soft, NT, ND Skin:  no rash or induration seen on limited exam Musculoskeletal:  grossly normal tone BUE/BLE, good ROM, no bony abnormality Psychiatric:  grossly normal mood and affect, speech fluent and appropriate, AOx3 Neurologic:  CN 2-12 grossly intact, moves all extremities in coordinated fashion  Data Reviewed: I have reviewed the patient's lab results since admission.  Pertinent labs for today include:   Stable BMP WBC 6.9 Hgb 9.3     Family Communication: None present  Mobility: PT/OT Consulted and are recommending - Skilled Nursing-Short Term Rehab (<3 Hours/Day)10/21/2024 1606    Code Status: Full Code    Disposition: Status is: Inpatient Remains inpatient appropriate because: awaiting placement     Time spent: 50 minutes  Unresulted Labs (From admission, onward)    None        Author: Delon Herald, MD 10/22/2024 1:39 PM  For on call review [email or web address].            "

## 2024-10-22 NOTE — Assessment & Plan Note (Deleted)
 D-dimer elevated 0.99, but normalizes with age adjustment Vascular duplex ultrasound right lower extremity negative for DVT CT angiogram chest : no evidence of PE

## 2024-10-22 NOTE — Assessment & Plan Note (Addendum)
 Body mass index is 30.34 kg/m.Shannon Roberts  Weight loss should be encouraged Outpatient PCP/bariatric medicine f/u encouraged Significantly low or high BMI is associated with higher medical risk including morbidity and mortality

## 2024-10-22 NOTE — Assessment & Plan Note (Deleted)
 Nutrition Problem: Increased nutrient needs Etiology: chronic illness (COPD) Signs/Symptoms: estimated needs Interventions: Ensure Enlive (each supplement provides 350kcal and 20 grams of protein), MVI, Liberalize Diet

## 2024-10-22 NOTE — Assessment & Plan Note (Deleted)
 Continue amlodipine , atenolol  Lisinopril  is on hold for at least 1 month, per nephrology

## 2024-10-22 NOTE — Assessment & Plan Note (Addendum)
 Normocytic anemia, likely associated with chronic disease Appears to be generally stable at this time

## 2024-10-22 NOTE — Assessment & Plan Note (Addendum)
 Patient presented with progressive shortness of breath after recent discharge on home oxygen following Covid-19 viral infection Has underlying COPD, follows with pulmonology Dr. Kara outpatient Patient is afebrile without leukocytosis Chest x-ray with no focal consolidation Completed antibiotics and prednisone  Continue Breztri  2 puffs twice daily (substituted for home Trelegy Ellipta ) Continue supplemental oxygen to maintain SpO2 greater than 88%; currently on 2 L nasal cannula with SpO2 98% at rest Continue hypertonic nebs as needed for airway clearance with IS/flutter valve Medically stable from this standpoint, will need to go home on home O2

## 2024-10-22 NOTE — Assessment & Plan Note (Deleted)
 Body mass index is 30.34 kg/m.SABRA  Weight loss should be encouraged Outpatient PCP/bariatric medicine f/u encouraged Significantly low or high BMI is associated with higher medical risk including morbidity and mortality

## 2024-10-22 NOTE — Assessment & Plan Note (Deleted)
 Continue Ativan  as needed Continue duloxetine 

## 2024-10-22 NOTE — Assessment & Plan Note (Addendum)
 D-dimer elevated 0.99, but normalizes with age adjustment Vascular duplex ultrasound right lower extremity negative for DVT CT angiogram chest : no evidence of PE

## 2024-10-22 NOTE — Assessment & Plan Note (Addendum)
 Continue Plavix , Imdur 

## 2024-10-22 NOTE — Assessment & Plan Note (Deleted)
 No c/o CP Continue Plavix , atenolol , Imdur 

## 2024-10-22 NOTE — Assessment & Plan Note (Deleted)
 Recent admission for hyponatremia associated with volume depletion and SIADH Continue sodium chloride  1 g p.o. 3 times daily AC Continue 1200 mL fluid restriction Sodium level stable

## 2024-10-22 NOTE — Assessment & Plan Note (Deleted)
"  Continue Synthroid            "

## 2024-10-22 NOTE — Assessment & Plan Note (Addendum)
"  Continue Synthroid            "

## 2024-10-22 NOTE — Assessment & Plan Note (Addendum)
 Continue Ativan  as needed Continue duloxetine 

## 2024-10-22 NOTE — Assessment & Plan Note (Addendum)
 Diet controlled Glucose in the low 100s

## 2024-10-22 NOTE — Assessment & Plan Note (Addendum)
 Continue amlodipine , atenolol  Lisinopril  is on hold for at least 1 month, per nephrology

## 2024-10-22 NOTE — Assessment & Plan Note (Addendum)
 No c/o CP Continue Plavix , atenolol , Imdur 

## 2024-10-22 NOTE — Assessment & Plan Note (Deleted)
 Patient presenting with progressive shortness of breath after recent discharged on home oxygen following Covid-19 viral infection Has underlying COPD, follows with pulmonology Dr. Kara outpatient Patient is afebrile without leukocytosis Chest x-ray with no focal consolidation Completed antibiotics and prednisone  Continue Breztri  2 puffs twice daily (substituted for home Trelegy Ellipta ) Continue supplemental oxygen to maintain SpO2 greater than 88%; currently on 2 L nasal cannula with SpO2 98% at rest Continue hypertonic nebs as needed for airway clearance with IS/flutter valve

## 2024-10-22 NOTE — Progress Notes (Signed)
 Patient's daughter, Mercy, left note in room for case manager with information regarding her mothers discharge. Mercy will be going out of town and would like contact to be Tabitha, whose number is written on the sheet. I took a picture of the information sheet and it is in the media.

## 2024-10-22 NOTE — Assessment & Plan Note (Deleted)
"   Continue simvastatin          "

## 2024-10-22 NOTE — Assessment & Plan Note (Addendum)
"   Continue simvastatin          "

## 2024-10-22 NOTE — Assessment & Plan Note (Deleted)
 Normocytic anemia, likely associated with chronic disease Appears to be generally stable at this time

## 2024-10-22 NOTE — Assessment & Plan Note (Deleted)
"   Continue PPI  "

## 2024-10-22 NOTE — Assessment & Plan Note (Addendum)
 Remains deconditioned following recent COVID infection Still with some ambulatory SOB, which is likely to improve over time PT/OT consulting and recommended STR but insurance has denied authorization She is appealing PT/OT to continue to work with her in case she needs to return home with home health but continue to recommend STR at this time

## 2024-10-22 NOTE — Assessment & Plan Note (Addendum)
 Recent admission for hyponatremia associated with volume depletion and SIADH Continue sodium chloride  1 g p.o. 3 times daily AC Continue 1200 mL fluid restriction Sodium level stable

## 2024-10-22 NOTE — Plan of Care (Incomplete)
" °  Problem: Clinical Measurements: Goal: Respiratory complications will improve Outcome: Progressing Goal: Cardiovascular complication will be avoided Outcome: Progressing   Problem: Activity: Goal: Risk for activity intolerance will decrease Outcome: Progressing   Problem: Education: Goal: Knowledge of General Education information will improve Description: Including pain rating scale, medication(s)/side effects and non-pharmacologic comfort measures Outcome: Adequate for Discharge   Problem: Health Behavior/Discharge Planning: Goal: Ability to manage health-related needs will improve Outcome: Adequate for Discharge   Problem: Clinical Measurements: Goal: Ability to maintain clinical measurements within normal limits will improve Outcome: Adequate for Discharge Goal: Will remain free from infection Outcome: Adequate for Discharge Goal: Diagnostic test results will improve Outcome: Adequate for Discharge   Problem: Nutrition: Goal: Adequate nutrition will be maintained Outcome: Adequate for Discharge   "

## 2024-10-22 NOTE — Assessment & Plan Note (Deleted)
 Remains deconditioned following recent COVID infection Still with some ambulatory SOB, which is likely to improve over time PT/OT consulting and recommended STR but insurance has denied authorization She is appealing PT/OT to continue to work with her in case she needs to return home with home health

## 2024-10-22 NOTE — TOC Progression Note (Signed)
 Transition of Care [facility]) - Progression Note    Patient Details  Name: Shannon Roberts MRN: [id number] Date of Birth: 1951/01/11  Transition of Care [facility]) CM/SW Contact  Tawni CHRISTELLA Eva, LCSW Phone Number: 10/22/2024, 11:36 AM  Clinical Narrative:     CSW spoke with Navi to confirm whether the pt had started an appeal for rehab. Navi informed CSW that [employer] would need to be contacted for any appeal information.  CSW spoke with a representative at [facility], who reported that the pt had not started the appeal process.  CSW then spoke with the pt, who reported that she did start an appeal but does not have a reference number.  CSW called [employer] again and spoke with another representative, who stated that the pt did file an appeal. Reference #[id number]; Appeal ID #[id number]. The representative stated that they have one more business day to render a decision. ICM to follow.  Expected Discharge Plan: Skilled Nursing Facility Barriers to Discharge: Continued Medical Work up               Expected Discharge Plan and Services In-house Referral: Clinical Social Work   Post Acute Care Choice: Resumption of Svcs/PTA Provider Living arrangements for the past 2 months: Single Family Home                 DME Arranged: N/A DME Agency: NA                   Social Drivers of Health (SDOH) Interventions SDOH Screenings   Food Insecurity: No Food Insecurity (10/11/2024)  Housing: Low Risk (10/11/2024)  Transportation Needs: No Transportation Needs (10/11/2024)  Utilities: Not At Risk (10/11/2024)  Social Connections: Socially Isolated (10/11/2024)  Tobacco Use: Medium Risk (10/10/2024)    Readmission Risk Interventions    10/11/2024    2:25 PM 10/07/2024    5:40 PM 10/05/2024    9:23 AM  Readmission Risk Prevention Plan  Transportation Screening Complete Complete Complete  PCP or Specialist Appt within 5-7 Days  Complete Complete  Home Care Screening  Complete Complete   Medication Review (RN CM)  Complete Complete  HRI or Home Care Consult Complete    Social Work Consult for Recovery Care Planning/Counseling Complete    Palliative Care Screening Not Applicable    Medication Review Oceanographer) Complete

## 2024-10-22 NOTE — Assessment & Plan Note (Addendum)
"   Continue PPI  "

## 2024-10-22 NOTE — Assessment & Plan Note (Addendum)
-  Appears to be stable at this time -Attempt to avoid nephrotoxic medications

## 2024-10-22 NOTE — Assessment & Plan Note (Deleted)
 Diet controlled Glucose in the low 100s

## 2024-10-22 NOTE — Assessment & Plan Note (Deleted)
-  Appears to be stable at this time -Attempt to avoid nephrotoxic medications

## 2024-10-23 DIAGNOSIS — J441 Chronic obstructive pulmonary disease with (acute) exacerbation: Secondary | ICD-10-CM | POA: Diagnosis not present

## 2024-10-23 MED ORDER — SENNOSIDES-DOCUSATE SODIUM 8.6-50 MG PO TABS: 1.0000 | ORAL_TABLET | Freq: Two times a day (BID) | ORAL | Status: AC

## 2024-10-23 MED ORDER — SODIUM CHLORIDE 1 G PO TABS: 1.0000 g | ORAL_TABLET | Freq: Three times a day (TID) | ORAL | Status: AC

## 2024-10-23 MED ORDER — LORAZEPAM 0.5 MG PO TABS: 0.5000 mg | ORAL_TABLET | Freq: Four times a day (QID) | ORAL | 0 refills | Status: AC | PRN

## 2024-10-23 MED ORDER — HYDROCODONE-ACETAMINOPHEN 5-325 MG PO TABS: 1.0000 | ORAL_TABLET | ORAL | 0 refills | Status: AC | PRN

## 2024-10-23 MED ORDER — ENSURE MAX PROTEIN PO LIQD: 11.0000 [oz_av] | Freq: Two times a day (BID) | ORAL | Status: AC

## 2024-10-23 MED ORDER — GUAIFENESIN-DM 100-10 MG/5ML PO SYRP: 5.0000 mL | ORAL_SOLUTION | ORAL | Status: AC | PRN

## 2024-10-23 MED ORDER — IPRATROPIUM-ALBUTEROL 0.5-2.5 (3) MG/3ML IN SOLN: 3.0000 mL | Freq: Two times a day (BID) | RESPIRATORY_TRACT | Status: DC

## 2024-10-23 MED ORDER — ADULT MULTIVITAMIN W/MINERALS CH: 1.0000 | ORAL_TABLET | Freq: Every day | ORAL | Status: AC

## 2024-10-23 MED ORDER — SODIUM CHLORIDE 3 % IN NEBU: 4.0000 mL | INHALATION_SOLUTION | Freq: Two times a day (BID) | RESPIRATORY_TRACT | Status: DC

## 2024-10-23 NOTE — Assessment & Plan Note (Addendum)
 Remains deconditioned following recent COVID infection Still with some ambulatory SOB, which is likely to improve over time PT/OT consulting and recommended STR but insurance has denied authorization She is appealing PT/OT to continue to work with her in case she needs to return home with home health but continue to recommend STR at this time

## 2024-10-23 NOTE — Assessment & Plan Note (Deleted)
 Body mass index is 30.34 kg/m.SABRA  Weight loss should be encouraged Outpatient PCP/bariatric medicine f/u encouraged Significantly low or high BMI is associated with higher medical risk including morbidity and mortality

## 2024-10-23 NOTE — Assessment & Plan Note (Addendum)
 Normocytic anemia, likely associated with chronic disease Appears to be generally stable at this time

## 2024-10-23 NOTE — Assessment & Plan Note (Deleted)
 No c/o CP Continue Plavix , atenolol , Imdur 

## 2024-10-23 NOTE — Assessment & Plan Note (Addendum)
"  Continue Synthroid            "

## 2024-10-23 NOTE — Assessment & Plan Note (Addendum)
"   Continue simvastatin          "

## 2024-10-23 NOTE — Assessment & Plan Note (Addendum)
 Nutrition Problem: Increased nutrient needs Etiology: chronic illness (COPD) Signs/Symptoms: estimated needs Interventions: Ensure Enlive (each supplement provides 350kcal and 20 grams of protein), MVI, Liberalize Diet

## 2024-10-23 NOTE — Assessment & Plan Note (Addendum)
 Recent admission for hyponatremia associated with volume depletion and SIADH Continue sodium chloride  1 g p.o. 3 times daily AC Continue 1200 mL fluid restriction Sodium level stable

## 2024-10-23 NOTE — Assessment & Plan Note (Addendum)
"  Continue PPI  "

## 2024-10-23 NOTE — Assessment & Plan Note (Deleted)
"  Continue PPI  "

## 2024-10-23 NOTE — Assessment & Plan Note (Deleted)
 Normocytic anemia, likely associated with chronic disease Appears to be generally stable at this time

## 2024-10-23 NOTE — Assessment & Plan Note (Deleted)
 Continue amlodipine , atenolol  Lisinopril  is on hold for at least 1 month, per nephrology

## 2024-10-23 NOTE — Assessment & Plan Note (Addendum)
 Continue Ativan  as needed Continue duloxetine 

## 2024-10-23 NOTE — Assessment & Plan Note (Deleted)
 D-dimer elevated 0.99, but normalizes with age adjustment Vascular duplex ultrasound right lower extremity negative for DVT CT angiogram chest : no evidence of PE

## 2024-10-23 NOTE — TOC Transition Note (Signed)
 Transition of Care [facility]) - Discharge Note   Patient Details  Name: Shannon Roberts MRN: [id number] Date of Birth: 08/04/1951  Transition of Care [facility]) CM/SW Contact:  Sonda Manuella Quill, RN Phone Number: 10/23/2024, 11:03 AM   Clinical Narrative:    D/C orders received; pt d/c to [facility]; Cobi assigned RM # [id number], call report # [phone]; transport by [facility]; pt notified and agreed to d/c plan; pt has notified her dtr; [facility] called for transport at 1059; spoke w/ operator # [id number]; D/C summary and SNF transfer report sent via SNF hub; no IP CM needs.    Final next level of care: Skilled Nursing Facility Barriers to Discharge: No Barriers Identified   Patient Goals and CMS Choice Patient states their goals for this hospitalization and ongoing recovery are:: SNF CMS Medicare.gov Compare Post Acute Care list provided to:: Patient Represenative (must comment) Choice offered to / list presented to : [facility] ownership interest in [facility].provided to:: Patient    Discharge Placement              Patient chooses bed at: [facility] Patient to be transferred to facility by: [facility] Name of family member notified: pt notified dtr Patient and family notified of of transfer: 10/23/24  Discharge Plan and Services Additional resources added to the After Visit Summary for   In-house Referral: Clinical Social Work   Post Acute Care Choice: Resumption of Svcs/PTA Provider          DME Arranged: N/A DME Agency: NA       HH Arranged: NA HH Agency: NA        Social Drivers of Health (SDOH) Interventions SDOH Screenings   Food Insecurity: No Food Insecurity (10/11/2024)  Housing: Low Risk (10/11/2024)  Transportation Needs: No Transportation Needs (10/11/2024)  Utilities: Not At Risk (10/11/2024)  Social Connections: Socially Isolated (10/11/2024)  Tobacco Use: Medium Risk (10/10/2024)     Readmission Risk Interventions     10/11/2024    2:25 PM 10/07/2024    5:40 PM 10/05/2024    9:23 AM  Readmission Risk Prevention Plan  Transportation Screening Complete Complete Complete  PCP or Specialist Appt within 5-7 Days  Complete Complete  Home Care Screening  Complete Complete  Medication Review (RN CM)  Complete Complete  HRI or Home Care Consult Complete    Social Work Consult for Recovery Care Planning/Counseling Complete    Palliative Care Screening Not Applicable    Medication Review Oceanographer) Complete

## 2024-10-23 NOTE — Assessment & Plan Note (Deleted)
 Recent admission for hyponatremia associated with volume depletion and SIADH Continue sodium chloride  1 g p.o. 3 times daily AC Continue 1200 mL fluid restriction Sodium level stable

## 2024-10-23 NOTE — Assessment & Plan Note (Deleted)
"   Continue simvastatin          "

## 2024-10-23 NOTE — Assessment & Plan Note (Deleted)
 Diet controlled Glucose in the low 100s

## 2024-10-23 NOTE — Assessment & Plan Note (Addendum)
 Body mass index is 30.34 kg/m.SABRA  Weight loss should be encouraged Outpatient PCP/bariatric medicine f/u encouraged Significantly low or high BMI is associated with higher medical risk including morbidity and mortality

## 2024-10-23 NOTE — Assessment & Plan Note (Addendum)
 Continue Plavix , Imdur 

## 2024-10-23 NOTE — Assessment & Plan Note (Addendum)
-  Appears to be stable at this time -Attempt to avoid nephrotoxic medications

## 2024-10-23 NOTE — Assessment & Plan Note (Addendum)
 Patient presented with progressive shortness of breath after recent discharge on home oxygen following Covid-19 viral infection Has underlying COPD, follows with pulmonology Dr. Kara outpatient Patient is afebrile without leukocytosis Chest x-ray with no focal consolidation Completed antibiotics and prednisone  Continue Breztri  2 puffs twice daily (substituted for home Trelegy Ellipta ) Continue supplemental oxygen to maintain SpO2 greater than 88%; currently on 2 L nasal cannula with SpO2 98% at rest Continue hypertonic nebs as needed for airway clearance with IS/flutter valve Medically stable from this standpoint, will need to go home on home O2

## 2024-10-23 NOTE — Assessment & Plan Note (Deleted)
"  Continue Synthroid            "

## 2024-10-23 NOTE — TOC Progression Note (Addendum)
 Transition of Care [facility]) - Progression Note    Patient Details  Name: Shannon Roberts MRN: [id number] Date of Birth: 1950-11-18  Transition of Care [facility]) CM/SW Contact  Sonda Manuella Quill, RN Phone Number: 10/23/2024, 8:51 AM  Clinical Narrative:    Ins Auth  received; Plan Auth ID #[id number], Auth ID # [id number]; start 10/22/24, end 10/28/24; LVM for Whitney at [facility] to inquire if pt can admit today; also spoke w/ Octaviano, Press Photographer at [facility]; he will see if pt can admit to today, and notify this RN CM; awaiting return call.  -0900- spoke w/ Cobi, SW at facility; she said pt can admit today; she will call back w/ room and call report #s; Dr Barbarann notified d/c summary needed.  Expected Discharge Plan: Skilled Nursing Facility Barriers to Discharge: Continued Medical Work up               Expected Discharge Plan and Services In-house Referral: Clinical Social Work   Post Acute Care Choice: Resumption of Svcs/PTA Provider Living arrangements for the past 2 months: Single Family Home                 DME Arranged: N/A DME Agency: NA                   Social Drivers of Health (SDOH) Interventions SDOH Screenings   Food Insecurity: No Food Insecurity (10/11/2024)  Housing: Low Risk (10/11/2024)  Transportation Needs: No Transportation Needs (10/11/2024)  Utilities: Not At Risk (10/11/2024)  Social Connections: Socially Isolated (10/11/2024)  Tobacco Use: Medium Risk (10/10/2024)    Readmission Risk Interventions    10/11/2024    2:25 PM 10/07/2024    5:40 PM 10/05/2024    9:23 AM  Readmission Risk Prevention Plan  Transportation Screening Complete Complete Complete  PCP or Specialist Appt within 5-7 Days  Complete Complete  Home Care Screening  Complete Complete  Medication Review (RN CM)  Complete Complete  HRI or Home Care Consult Complete    Social Work Consult for Recovery Care Planning/Counseling Complete    Palliative Care Screening Not Applicable     Medication Review Oceanographer) Complete

## 2024-10-23 NOTE — Assessment & Plan Note (Addendum)
 D-dimer elevated 0.99, but normalizes with age adjustment Vascular duplex ultrasound right lower extremity negative for DVT CT angiogram chest : no evidence of PE

## 2024-10-23 NOTE — Assessment & Plan Note (Deleted)
-  Appears to be stable at this time -Attempt to avoid nephrotoxic medications

## 2024-10-23 NOTE — Assessment & Plan Note (Deleted)
 Patient presented with progressive shortness of breath after recent discharge on home oxygen following Covid-19 viral infection Has underlying COPD, follows with pulmonology Dr. Kara outpatient Patient is afebrile without leukocytosis Chest x-ray with no focal consolidation Completed antibiotics and prednisone  Continue Breztri  2 puffs twice daily (substituted for home Trelegy Ellipta ) Continue supplemental oxygen to maintain SpO2 greater than 88%; currently on 2 L nasal cannula with SpO2 98% at rest Continue hypertonic nebs as needed for airway clearance with IS/flutter valve Medically stable from this standpoint, will need to go home on home O2

## 2024-10-23 NOTE — Assessment & Plan Note (Addendum)
 No c/o CP Continue Plavix , atenolol , Imdur 

## 2024-10-23 NOTE — Assessment & Plan Note (Addendum)
 Diet controlled Glucose in the low 100s

## 2024-10-23 NOTE — Assessment & Plan Note (Addendum)
 Continue amlodipine , atenolol  Lisinopril  is on hold for at least 1 month, per nephrology

## 2024-10-23 NOTE — Assessment & Plan Note (Deleted)
 Remains deconditioned following recent COVID infection Still with some ambulatory SOB, which is likely to improve over time PT/OT consulting and recommended STR but insurance has denied authorization She is appealing PT/OT to continue to work with her in case she needs to return home with home health but continue to recommend STR at this time

## 2024-10-23 NOTE — Assessment & Plan Note (Deleted)
 Nutrition Problem: Increased nutrient needs Etiology: chronic illness (COPD) Signs/Symptoms: estimated needs Interventions: Ensure Enlive (each supplement provides 350kcal and 20 grams of protein), MVI, Liberalize Diet

## 2024-10-23 NOTE — Discharge Summary (Signed)
 " Physician Discharge Summary   Patient: Shannon Roberts MRN: [id number] DOB: 1950-11-24  Admit date:     10/10/2024  Discharge date: 10/23/2024  Discharge Physician: Delon Herald   PCP: Okey Carlin Redbird, MD   Recommendations at discharge:   You are being discharged to [facility] for short-term rehabilitation Continue oxygen supplementation at 2L Continue hypertonic nebs as needed Follow up with pulmonology after discharge from rehab Will refer to pulmonary rehabilitation to start post-STR Follow up with Dr. Okey after discharge from rehab  Discharge Diagnoses: Principal Problem:   COPD exacerbation (HCC) Active Problems:   GERD (gastroesophageal reflux disease)   Hyperlipidemia   Anxiety disorder   History of CVA (cerebrovascular accident)   Anemia   Pre-diabetes   Chronic kidney disease, stage 3a (HCC)   Hyponatremia   Essential hypertension   Hypothyroidism   Class 1 obesity due to excess calories with body mass index (BMI) of 31.0 to 31.9 in adult   CAD (coronary artery disease)   Leg edema, right   Physical deconditioning   Increased nutritional needs   Hospital Course: 73yo with h/o HTN, HLD, stage 3a CKD, COPD, hypothyroidism, anxiety, and recent COVID-19 infection who presented on 12/21 with SOB.  Admitted for COPD exacerbation, treated with steroids and bronchodilators.  Pulmonology consulted.  PT/OT recommended SNF but insurance denied after peer to peer.  Family is appealing.  Assessment and Plan:  Assessment & Plan COPD exacerbation [facility]) Patient presented with progressive shortness of breath after recent discharge on home oxygen following Covid-19 viral infection Has underlying COPD, follows with pulmonology Dr. Kara outpatient Patient is afebrile without leukocytosis Chest x-ray with no focal consolidation Completed antibiotics and prednisone  Continue Breztri  2 puffs twice daily (substituted for home Trelegy Ellipta ) Continue supplemental oxygen to  maintain SpO2 greater than 88%; currently on 2 L nasal cannula with SpO2 98% at rest Continue hypertonic nebs as needed for airway clearance with IS/flutter valve Medically stable from this standpoint, will need to go home on home O2 Hyperlipidemia Continue simvastatin  Anxiety disorder Continue Ativan  as needed Continue duloxetine  History of CVA (cerebrovascular accident) Continue Plavix , Imdur  Anemia Normocytic anemia, likely associated with chronic disease Appears to be generally stable at this time Chronic kidney disease, stage 3a (HCC) Appears to be stable at this time Attempt to avoid nephrotoxic medications Hyponatremia Recent admission for hyponatremia associated with volume depletion and SIADH Continue sodium chloride  1 g p.o. 3 times daily AC Continue 1200 mL fluid restriction Sodium level stable Hypothyroidism Continue Synthroid  CAD (coronary artery disease) No c/o CP Continue Plavix , atenolol , Imdur  Leg edema, right D-dimer elevated 0.99, but normalizes with age adjustment Vascular duplex ultrasound right lower extremity negative for DVT CT angiogram chest : no evidence of PE Physical deconditioning Remains deconditioned following recent COVID infection Still with some ambulatory SOB, which is likely to improve over time PT/OT consulting and recommended STR but insurance has denied authorization She is appealing PT/OT to continue to work with her in case she needs to return home with home health but continue to recommend STR at this time Increased nutritional needs Nutrition Problem: Increased nutrient needs Etiology: chronic illness (COPD) Signs/Symptoms: estimated needs Interventions: Ensure Enlive (each supplement provides 350kcal and 20 grams of protein), MVI, Liberalize Diet GERD (gastroesophageal reflux disease) Continue PPI Essential hypertension Continue amlodipine , atenolol  Lisinopril  is on hold for at least 1 month, per nephrology Pre-diabetes Diet  controlled Glucose in the low 100s Class 1 obesity due to excess calories with body mass index (  BMI) of 31.0 to 31.9 in adult Body mass index is 30.34 kg/m.SABRA  Weight loss should be encouraged Outpatient PCP/bariatric medicine f/u encouraged Significantly low or high BMI is associated with higher medical risk including morbidity and mortality      Consultants: Pulmonology PT OT ICM team Dietician   Procedures: RLE DVT US  12/22   Antibiotics: None   Pain control - [place]  Controlled Substance Reporting System database was reviewed. and patient was instructed, not to drive, operate heavy machinery, perform activities at heights, swimming or participation in water  activities or provide baby-sitting services while on Pain, Sleep and Anxiety Medications; until their outpatient Physician has advised to do so again. Also recommended to not to take more than prescribed Pain, Sleep and Anxiety Medications.   Disposition: Skilled nursing facility Diet recommendation:  Carb modified diet DISCHARGE MEDICATION: Allergies as of 10/23/2024       Reactions   Azithromycin  Other (See Comments)   Pt states she cannot take this due to her heart medication and heart condition   Codeine  Shortness Of Breath   Carvedilol Palpitations   Dapsone Other (See Comments)   Flushing    Nsaids Other (See Comments)   Contraindication due to CKD    Septra [sulfamethoxazole-trimethoprim] Diarrhea, Nausea And Vomiting   Amoxicillin Rash, Other (See Comments)   FLUSHED FEELING   Niacin And Related Other (See Comments)   Flushing    Paroxetine Hcl Other (See Comments)   Dizziness and dreams   Sertraline Hcl Other (See Comments)   Reported skin crawling and a fuzzy head   Wellbutrin [bupropion] Anxiety, Other (See Comments)   Caused the patient to feel jittery        Medication List     PAUSE taking these medications    lisinopril  40 MG tablet Wait to take this until your doctor or  other care provider tells you to start again. Commonly known as: ZESTRIL  Take 40 mg by mouth daily.       TAKE these medications    acetaminophen  325 MG tablet Commonly known as: TYLENOL  Take 2 tablets (650 mg total) by mouth every 6 (six) hours as needed for mild pain (pain score 1-3) or fever (or Fever >/= 101).   albuterol  (2.5 MG/3ML) 0.083% nebulizer solution Commonly known as: PROVENTIL  Take 3 mLs (2.5 mg total) by nebulization every 6 (six) hours as needed for wheezing or shortness of breath.   amLODipine  5 MG tablet Commonly known as: NORVASC  Take 5 mg by mouth daily.   atenolol  50 MG tablet Commonly known as: TENORMIN  Take 50 mg by mouth daily.   clopidogrel  75 MG tablet Commonly known as: PLAVIX  Take 75 mg by mouth daily.   Combivent  Respimat 20-100 MCG/ACT Aers respimat Generic drug: Ipratropium-Albuterol  Inhale 1 puff into the lungs every 4 (four) hours as needed for wheezing or shortness of breath.   DULoxetine  30 MG capsule Commonly known as: CYMBALTA  Take 60 mg by mouth in the morning.   Ensure Max Protein Liqd Take 330 mLs (11 oz total) by mouth 2 (two) times daily.   ferrous sulfate  325 (65 FE) MG tablet Take 325 mg by mouth daily with breakfast.   fluticasone  50 MCG/ACT nasal spray Commonly known as: FLONASE  Place 1 spray into both nostrils 2 (two) times daily as needed for allergies or rhinitis.   guaiFENesin -dextromethorphan  100-10 MG/5ML syrup Commonly known as: ROBITUSSIN DM Take 5 mLs by mouth every 4 (four) hours as needed for cough (chest congestion).  HYDROcodone -acetaminophen  5-325 MG tablet Commonly known as: NORCO/VICODIN Take 1-2 tablets by mouth every 4 (four) hours as needed for moderate pain (pain score 4-6).   isosorbide  mononitrate 30 MG 24 hr tablet Commonly known as: IMDUR  Take 30 mg by mouth in the morning and at bedtime.   levothyroxine  50 MCG tablet Commonly known as: SYNTHROID  Take 50 mcg by mouth daily before  breakfast.   LORazepam  0.5 MG tablet Commonly known as: ATIVAN  Take 1-1.5 tablets (0.5-0.75 mg total) by mouth every 6 (six) hours as needed for anxiety.   Mucinex  600 MG 12 hr tablet Generic drug: guaiFENesin  Take 600 mg by mouth 2 (two) times daily as needed for to loosen phlegm.   multivitamin with minerals Tabs tablet Take 1 tablet by mouth daily.   ondansetron  4 MG disintegrating tablet Commonly known as: ZOFRAN -ODT Take 1 tablet (4 mg total) by mouth every 8 (eight) hours as needed for nausea or vomiting. What changed: reasons to take this   pantoprazole  40 MG tablet Commonly known as: PROTONIX  Take 40 mg by mouth daily before breakfast.   polyethylene glycol powder 17 GM/SCOOP powder Commonly known as: GLYCOLAX /MIRALAX  Take 17 g by mouth daily. Dissolve 1 capful (17g) in 4-8 ounces of liquid and take by mouth daily.   Repatha  SureClick 140 MG/ML Soaj Generic drug: Evolocumab  Inject 140 mg into the skin every 14 (fourteen) days. INJECT UNDER THE SKIN EVERY 14 DAYS. What changed: additional instructions   senna-docusate 8.6-50 MG tablet Commonly known as: Senokot-S Take 1 tablet by mouth 2 (two) times daily.   simvastatin  20 MG tablet Commonly known as: ZOCOR  Take 40 mg by mouth at bedtime.   sodium chloride  1 g tablet Take 1 tablet (1 g total) by mouth 3 (three) times daily with meals. What changed: how much to take   sodium chloride  HYPERTONIC 3 % nebulizer solution Take 4 mLs by nebulization 2 (two) times daily.   Trelegy Ellipta  200-62.5-25 MCG/ACT Aepb Generic drug: Fluticasone -Umeclidin-Vilant Inhale 1 puff into the lungs daily.   Vitamin D3 125 MCG (5000 UT) Caps Take 5,000 Units by mouth See admin instructions. Take 5,000 units by mouth daily except on Saturdays and Sundays               Durable Medical Equipment  (From admission, onward)           Start     Ordered   10/23/24 0955  DME Oxygen  Once       Question Answer Comment   Length of Need Lifetime   Mode or (Route) Nasal cannula   Liters per Minute 2   Oxygen delivery system: Gas      01 /03/26 0954            Follow-up Information     [facility]) Follow up.   Specialty: Home Health Services Why: [facility] will provide PT, OT, and nursing at home after discharge. Contact information: [address] [place]  [phone]               Discharge Exam:   Subjective: Feeling good and very excited about the opportunity to go to rehab today!   Objective: Vitals:   10/23/24 0413 10/23/24 0745  BP: 119/71   Pulse: 80   Resp:    Temp: 98 F (36.7 C)   SpO2: 100% 99%    Intake/Output Summary (Last 24 hours) at 10/23/2024 0954 Last data filed at 10/23/2024  9076 Gross per 24 hour  Intake 597 ml  Output --  Net 597 ml   Filed Weights   10/10/24 1736  Weight: 82.7 kg    Exam:  General:  Appears calm and comfortable and is in NAD, wearing [place] O2, on bedside commode Eyes:  normal lids, iris ENT:  grossly normal hearing, lips & tongue, mmm Cardiovascular:  RRR. No LE edema.  Respiratory:   CTA bilaterally with no wheezes/rales/rhonchi.  Normal respiratory effort. Abdomen:  soft, NT, ND Skin:  no rash or induration seen on limited exam Musculoskeletal:  grossly normal tone BUE/BLE, good ROM, no bony abnormality Psychiatric:  grossly normal mood and affect, speech fluent and appropriate, AOx3 Neurologic:  CN 2-12 grossly intact, moves all extremities in coordinated fashion, sensation intact  Data Reviewed: I have reviewed the patient's lab results since admission.  Pertinent labs for today include:   None today    Condition at discharge: improving  The results of significant diagnostics from this hospitalization (including imaging, microbiology, ancillary and laboratory) are listed below for reference.   Imaging Studies: CT Angio Chest Pulmonary  Embolism (PE) W or WO Contrast Result Date: 10/16/2024 CLINICAL DATA:  Positive D-dimer.  Pulmonary embolism suspected. EXAM: CT ANGIOGRAPHY CHEST WITH CONTRAST TECHNIQUE: Multidetector CT imaging of the chest was performed using the standard protocol during bolus administration of intravenous contrast. Multiplanar CT image reconstructions and MIPs were obtained to evaluate the vascular anatomy. RADIATION DOSE REDUCTION: This exam was performed according to the departmental dose-optimization program which includes automated exposure control, adjustment of the mA and/or kV according to patient size and/or use of iterative reconstruction technique. CONTRAST:  75mL OMNIPAQUE  IOHEXOL  350 MG/ML SOLN COMPARISON:  06/15/2022 FINDINGS: Cardiovascular: The heart size is normal. No substantial pericardial effusion. Coronary artery calcification is evident. Mild atherosclerotic calcification is noted in the wall of the thoracic aorta. There is no filling defect within the opacified pulmonary arteries to suggest the presence of an acute pulmonary embolus. Mediastinum/Nodes: No mediastinal lymphadenopathy. There is no hilar lymphadenopathy. The esophagus has normal imaging features. There is no axillary lymphadenopathy. Lungs/Pleura: Centrilobular and paraseptal emphysema evident. 5 mm right upper lobe nodule identified on 69/12. Clustered tree-in-bud nodularity noted left lower lobe with nodules measuring up to 3-4 mm size range. No dense consolidative airspace disease. No pulmonary edema or pleural effusion. Upper Abdomen: Visualized portion of the upper abdomen shows no acute findings. Musculoskeletal: No worrisome lytic or sclerotic osseous abnormality. Review of the MIP images confirms the above findings. IMPRESSION: 1. No CT evidence for acute pulmonary embolus. 2. Clustered tree-in-bud nodularity left lower lobe with nodules measuring up to 3-4 mm size range. Imaging features are most suggestive of  infectious/inflammatory etiology. 3. 5 mm right upper lobe pulmonary nodule. No follow-up needed if patient is low-risk.This recommendation follows the consensus statement: Guidelines for Management of Incidental Pulmonary Nodules Detected on CT Images: From the Fleischner Society 2017; Radiology 2017; 284:228-243. 4.  Coronary artery atherosclerosis. 5. Aortic Atherosclerosis (ICD10-I70.0) and Emphysema (ICD10-J43.9). Electronically Signed   By: Camellia Candle M.D.   On: 10/16/2024 13:45   VAS US  LOWER EXTREMITY VENOUS (DVT) Result Date: 10/11/2024  Lower Venous DVT Study Patient Name:  ANDREW SORIA  Date of Exam:   10/11/2024 Medical Rec #: [id number]         Accession #:    [id number] Date of Birth: 1950/10/22         Patient Gender: F Patient Age:   24 years Exam Location[facility]  [facility] Procedure:      VAS US  LOWER EXTREMITY VENOUS (DVT) Referring Phys: ANASTASSIA DOUTOVA --------------------------------------------------------------------------------  Indications: Swelling, and Recent Covid infection.  Comparison Study: Prior negative bilateral LEV done 01/31/17 Performing Technologist: Alberta Lis RVS  Examination Guidelines: A complete evaluation includes B-mode imaging, spectral Doppler, color Doppler, and power Doppler as needed of all accessible portions of each vessel. Bilateral testing is considered an integral part of a complete examination. Limited examinations for reoccurring indications may be performed as noted. The reflux portion of the exam is performed with the patient in reverse Trendelenburg.  +---------+---------------+---------+-----------+----------+--------------+ RIGHT    CompressibilityPhasicitySpontaneityPropertiesThrombus Aging +---------+---------------+---------+-----------+----------+--------------+ CFV      Full           Yes      Yes                                 +---------+---------------+---------+-----------+----------+--------------+ SFJ       Full                                                        +---------+---------------+---------+-----------+----------+--------------+ FV Prox  Full           Yes      Yes                                 +---------+---------------+---------+-----------+----------+--------------+ FV Mid   Full           Yes      Yes                                 +---------+---------------+---------+-----------+----------+--------------+ FV DistalFull                                                        +---------+---------------+---------+-----------+----------+--------------+ PFV      Full                                                        +---------+---------------+---------+-----------+----------+--------------+ POP      Full           Yes      Yes                                 +---------+---------------+---------+-----------+----------+--------------+ PTV      Full                                                        +---------+---------------+---------+-----------+----------+--------------+ PERO     Full                                                        +---------+---------------+---------+-----------+----------+--------------+  Gastroc  Full                                                        +---------+---------------+---------+-----------+----------+--------------+ SSV      Full                                                        +---------+---------------+---------+-----------+----------+--------------+   +----+---------------+---------+-----------+----------+--------------+ LEFTCompressibilityPhasicitySpontaneityPropertiesThrombus Aging +----+---------------+---------+-----------+----------+--------------+ CFV Full           Yes      Yes                                 +----+---------------+---------+-----------+----------+--------------+ SFJ Full                                                         +----+---------------+---------+-----------+----------+--------------+     Summary: RIGHT: - There is no evidence of deep vein thrombosis in the lower extremity.  - No cystic structure found in the popliteal fossa.  LEFT: - No evidence of common femoral vein obstruction.   *See table(s) above for measurements and observations. Electronically signed by Lonni Gaskins MD on 10/11/2024 at 2:56:58 PM.    Final    DG Chest Port 1 View Result Date: 10/10/2024 CLINICAL DATA:  Shortness of breath. EXAM: PORTABLE CHEST 1 VIEW COMPARISON:  Chest radiograph dated 10/08/2024. FINDINGS: Background of emphysema. No focal consolidation, pleural effusion, or pneumothorax. The cardiac silhouette is within normal limits. No acute osseous pathology. IMPRESSION: No active disease. Electronically Signed   By: Vanetta Chou M.D.   On: 10/10/2024 17:54   DG Chest 2 View Result Date: 10/08/2024 CLINICAL DATA:  Shortness of breath. EXAM: CHEST - 2 VIEW COMPARISON:  October 05, 2024 FINDINGS: The heart size and mediastinal contours are within normal limits. Low lung volumes are noted. Mild atelectatic changes are seen within the bilateral lung bases. No pleural effusion or pneumothorax is identified. The visualized skeletal structures are unremarkable. IMPRESSION: No active cardiopulmonary disease. Electronically Signed   By: Suzen Dials M.D.   On: 10/08/2024 17:41   DG CHEST PORT 1 VIEW Result Date: 10/05/2024 CLINICAL DATA:  Follow-up exam. EXAM: PORTABLE CHEST 1 VIEW COMPARISON:  10/03/2024 FINDINGS: Lungs are hypoinflated without focal airspace consolidation or effusion. Cardiomediastinal silhouette and remainder of the exam is unchanged. IMPRESSION: Hypoinflation without acute cardiopulmonary disease. Electronically Signed   By: Toribio Agreste M.D.   On: 10/05/2024 15:16   DG Chest Port 1 View Result Date: 10/03/2024 CLINICAL DATA:  Shortness of breath.  Recent COVID. EXAM: PORTABLE CHEST 1 VIEW COMPARISON:   01/23/2024. FINDINGS: The heart size and mediastinal contours are within normal limits. Atherosclerotic calcification of the aorta is noted. No consolidation, effusion, or pneumothorax is seen. No acute osseous abnormality. IMPRESSION: No active disease. Electronically Signed   By: Leita Birmingham M.D.   On: 10/03/2024 14:05    Microbiology: Results for orders placed or performed  during the hospital encounter of 10/10/24  Resp panel by RT-PCR (RSV, Flu A&B, Covid) Anterior Nasal Swab     Status: None   Collection Time: 10/10/24  6:35 PM   Specimen: Anterior Nasal Swab  Result Value Ref Range Status   SARS Coronavirus 2 by RT PCR NEGATIVE NEGATIVE Final    Comment: (NOTE) SARS-CoV-2 target nucleic acids are NOT DETECTED.  The SARS-CoV-2 RNA is generally detectable in upper respiratory specimens during the acute phase of infection. The lowest concentration of SARS-CoV-2 viral copies this assay can detect is 138 copies/mL. A negative result does not preclude SARS-Cov-2 infection and should not be used as the sole basis for treatment or other patient management decisions. A negative result may occur with  improper specimen collection/handling, submission of specimen other than nasopharyngeal swab, presence of viral mutation(s) within the areas targeted by this assay, and inadequate number of viral copies(<138 copies/mL). A negative result must be combined with clinical observations, patient history, and epidemiological information. The expected result is Negative.  Fact Sheet for Patients:  bloggercourse.com  Fact Sheet for Healthcare Providers:  seriousbroker.it  This test is no t yet approved or cleared by the [place]  FDA and  has been authorized for detection and/or diagnosis of SARS-CoV-2 by FDA under an Emergency Use Authorization (EUA). This EUA will remain  in effect (meaning this test can be used) for the duration of  the COVID-19 declaration under Section 564(b)(1) of the Act, 21 U.S.C.section 360bbb-3(b)(1), unless the authorization is terminated  or revoked sooner.       Influenza A by PCR NEGATIVE NEGATIVE Final   Influenza B by PCR NEGATIVE NEGATIVE Final    Comment: (NOTE) The Xpert Xpress SARS-CoV-2/FLU/RSV plus assay is intended as an aid in the diagnosis of influenza from Nasopharyngeal swab specimens and should not be used as a sole basis for treatment. Nasal washings and aspirates are unacceptable for Xpert Xpress SARS-CoV-2/FLU/RSV testing.  Fact Sheet for Patients: bloggercourse.com  Fact Sheet for Healthcare Providers: seriousbroker.it  This test is not yet approved or cleared by the [place]  FDA and has been authorized for detection and/or diagnosis of SARS-CoV-2 by FDA under an Emergency Use Authorization (EUA). This EUA will remain in effect (meaning this test can be used) for the duration of the COVID-19 declaration under Section 564(b)(1) of the Act, 21 U.S.C. section 360bbb-3(b)(1), unless the authorization is terminated or revoked.     Resp Syncytial Virus by PCR NEGATIVE NEGATIVE Final    Comment: (NOTE) Fact Sheet for Patients: bloggercourse.com  Fact Sheet for Healthcare Providers: seriousbroker.it  This test is not yet approved or cleared by the [place]  FDA and has been authorized for detection and/or diagnosis of SARS-CoV-2 by FDA under an Emergency Use Authorization (EUA). This EUA will remain in effect (meaning this test can be used) for the duration of the COVID-19 declaration under Section 564(b)(1) of the Act, 21 U.S.C. section 360bbb-3(b)(1), unless the authorization is terminated or revoked.  Performed at [facility], [address] [place], [place] [address]     Labs: CBC: Recent Labs  Lab 10/17/24 0359  10/18/24 0347 10/22/24 0335  WBC 13.2* 10.3 6.9  HGB 10.7* 10.1* 9.3*  HCT 32.7* 30.7* 28.5*  MCV 93.7 95.3 96.6  PLT 295 244 203   Basic Metabolic Panel: Recent Labs  Lab 10/17/24 0359 10/18/24 0347 10/20/24 0413 10/22/24 0335  NA 131* 134* 134* 134*  K 4.3 4.3 4.3 4.3  CL 98 102  101 99  CO2 24 24 25 25   GLUCOSE 118* 111* 111* 128*  BUN 19 18 11 17   CREATININE 1.12* 1.15* 1.05* 1.13*  CALCIUM  9.2 9.1 9.2 9.9   Liver Function Tests: No results for input(s): AST, ALT, ALKPHOS, BILITOT, PROT, ALBUMIN  in the last 168 hours. CBG: No results for input(s): GLUCAP in the last 168 hours.  Discharge time spent: greater than 30 minutes.  Signed: Delon Herald, MD [facility] 10/23/2024 "

## 2024-10-23 NOTE — Assessment & Plan Note (Deleted)
 Continue Ativan  as needed Continue duloxetine 

## 2024-10-23 NOTE — Assessment & Plan Note (Deleted)
 Continue Plavix , Imdur 

## 2024-11-02 ENCOUNTER — Ambulatory Visit: Admitting: Pulmonary Disease

## 2024-11-02 ENCOUNTER — Encounter: Payer: Self-pay | Admitting: Pulmonary Disease

## 2024-11-02 ENCOUNTER — Telehealth: Payer: Self-pay

## 2024-11-02 VITALS — BP 139/83 | HR 85 | Ht 65.0 in | Wt 194.4 lb

## 2024-11-02 DIAGNOSIS — J9611 Chronic respiratory failure with hypoxia: Secondary | ICD-10-CM

## 2024-11-02 DIAGNOSIS — J441 Chronic obstructive pulmonary disease with (acute) exacerbation: Secondary | ICD-10-CM

## 2024-11-02 DIAGNOSIS — R911 Solitary pulmonary nodule: Secondary | ICD-10-CM

## 2024-11-02 DIAGNOSIS — Z87891 Personal history of nicotine dependence: Secondary | ICD-10-CM

## 2024-11-02 NOTE — Telephone Encounter (Signed)
 Ohtuvayre  paperwork handed into pharmacy

## 2024-11-02 NOTE — Patient Instructions (Addendum)
 Continue trelegy 1 puff daily - rinse mouth out after each use  Use albuterol  nebulizer or hypertonic saline nebulizer treatment followed by flutter valve  Start Ohtuvayre  nebulizer treatments twice daily  We will check your oxygen levels today on room air to confirm you still need oxygen  Follow up in 1 month

## 2024-11-02 NOTE — Progress Notes (Unsigned)
 "  Established Patient Pulmonology Office Visit   Subjective:  Patient ID: Shannon Roberts, female    DOB: Jan 22, 1951  MRN: [id number]  CC:  Chief Complaint  Patient presents with   Medical Management of Chronic Issues    Pt states been  well just left [place] yesterday back home     Discussed the use of AI scribe software for clinical note transcription with the patient, who gave verbal consent to proceed.  History of Present Illness Shannon Roberts is a 74 year old female with chronic respiratory issues who presents with shortness of breath and mucus production.  She has had worsening shortness of breath and increased yellow mucus since last Wednesday, starting with a recurrent cough and dyspnea. Coughing up mucus last night gave partial relief.  She was discharged from the hospital last Friday and spent over a week in a transitional rehab facility. There she was mistakenly given Trelegy twice daily instead of her usual once daily. She is now back on Trelegy one puff daily and uses a flutter valve twice daily, which helps expectorate mucus after breathing treatments.  She uses supplemental oxygen and recently had an episode where water  entered her tubing and she felt unable to breathe, requiring assistance from her son. She has not checked her oxygen saturation off supplemental oxygen since rehab or at home.  She continues sodium with her breathing treatments, which helps loosen mucus. She remains bothered by ongoing cough, dyspnea, and mucus production.  She reports yellow sinus drainage, which she brings down from her nose into her throat.      {PULM QUESTIONNAIRES (Optional):33196}  ROS  {History (Optional):23778} Current Medications[1]      Objective:  BP 139/83   Pulse 85   Ht 5' 5 (1.651 m) Comment: per pt  Wt 194 lb 6.4 oz (88.2 kg)   SpO2 99%   PF (!) 2 L/min Comment: via tank  BMI 32.35 kg/m   {Pulm Vitals (Optional):32837}  Physical Exam Pulmonary:      Breath sounds: Wheezing (right anterior) present.      Diagnostic Review:  {Labs (Optional):32838}     Assessment & Plan:   Assessment & Plan COPD with acute exacerbation [facility])  Orders:   Ambulatory Referral for DME   Ambulatory referral to Pharmacotherapy Clinic   Assessment and Plan Assessment & Plan COPD with acute exacerbation COPD exacerbation with increased cough, dyspnea, and yellow sputum. Recent hospitalization and rehab. Wheezing noted. Differential includes prolonged post-infectious reaction versus other airway issues. Discussed bronchoscopy if symptoms persist. Considered long-term antibiotics. Discussed Otuver for dyspnea. Prednisone  not preferred. - Continue Trelegy one puff daily. - Use flutter valve twice daily. - Consider Otuver nebulizer pending insurance approval. - Monitor oxygen levels on room air; use oxygen as needed during activity and sleep. - Consider bronchoscopy if symptoms persist. - Consider long-term azithromycin  three days a week if needed.      No follow-ups on file.   Dorn KATHEE Chill, MD     [1]  Current Outpatient Medications:    acetaminophen  (TYLENOL ) 325 MG tablet, Take 2 tablets (650 mg total) by mouth every 6 (six) hours as needed for mild pain (pain score 1-3) or fever (or Fever >/= 101)., Disp: , Rfl:    albuterol  (PROVENTIL ) (2.5 MG/3ML) 0.083% nebulizer solution, Take 3 mLs (2.5 mg total) by nebulization every 6 (six) hours as needed for wheezing or shortness of breath., Disp: 75 mL, Rfl: 5   amLODipine  (NORVASC ) 5 MG tablet,  Take 5 mg by mouth daily., Disp: , Rfl:    atenolol  (TENORMIN ) 50 MG tablet, Take 50 mg by mouth daily., Disp: , Rfl:    Cholecalciferol (VITAMIN D3) 5000 units CAPS, Take 5,000 Units by mouth See admin instructions. Take 5,000 units by mouth daily except on Saturdays and Sundays, Disp: , Rfl:    clopidogrel  (PLAVIX ) 75 MG tablet, Take 75 mg by mouth daily. , Disp: , Rfl:    DULoxetine  (CYMBALTA ) 30 MG  capsule, Take 60 mg by mouth in the morning., Disp: , Rfl:    Ensure Max Protein (ENSURE MAX PROTEIN) LIQD, Take 330 mLs (11 oz total) by mouth 2 (two) times daily., Disp: , Rfl:    Evolocumab  (REPATHA  SURECLICK) 140 MG/ML SOAJ, Inject 140 mg into the skin every 14 (fourteen) days. INJECT UNDER THE SKIN EVERY 14 DAYS. (Patient taking differently: Inject 140 mg into the skin every 14 (fourteen) days.), Disp: 6 mL, Rfl: 3   ferrous sulfate  325 (65 FE) MG tablet, Take 325 mg by mouth daily with breakfast., Disp: , Rfl:    fluticasone  (FLONASE ) 50 MCG/ACT nasal spray, Place 1 spray into both nostrils 2 (two) times daily as needed for allergies or rhinitis., Disp: , Rfl:    Fluticasone -Umeclidin-Vilant (TRELEGY ELLIPTA ) 200-62.5-25 MCG/ACT AEPB, Inhale 1 puff into the lungs daily., Disp: 60 each, Rfl: 5   guaiFENesin -dextromethorphan  (ROBITUSSIN DM) 100-10 MG/5ML syrup, Take 5 mLs by mouth every 4 (four) hours as needed for cough (chest congestion)., Disp: , Rfl:    HYDROcodone -acetaminophen  (NORCO/VICODIN) 5-325 MG tablet, Take 1-2 tablets by mouth every 4 (four) hours as needed for moderate pain (pain score 4-6)., Disp: 20 tablet, Rfl: 0   Ipratropium-Albuterol  (COMBIVENT  RESPIMAT) 20-100 MCG/ACT AERS respimat, Inhale 1 puff into the lungs every 4 (four) hours as needed for wheezing or shortness of breath., Disp: 4 g, Rfl: 11   isosorbide  mononitrate (IMDUR ) 30 MG 24 hr tablet, Take 30 mg by mouth in the morning and at bedtime., Disp: , Rfl: 0   levothyroxine  (SYNTHROID ) 50 MCG tablet, Take 50 mcg by mouth daily before breakfast., Disp: , Rfl:    [Paused] lisinopril  (PRINIVIL ,ZESTRIL ) 40 MG tablet, Take 40 mg by mouth daily., Disp: , Rfl:    LORazepam  (ATIVAN ) 0.5 MG tablet, Take 1-1.5 tablets (0.5-0.75 mg total) by mouth every 6 (six) hours as needed for anxiety., Disp: 30 tablet, Rfl: 0   MUCINEX  600 MG 12 hr tablet, Take 600 mg by mouth 2 (two) times daily as needed for to loosen phlegm., Disp: ,  Rfl:    Multiple Vitamin (MULTIVITAMIN WITH MINERALS) TABS tablet, Take 1 tablet by mouth daily., Disp: , Rfl:    ondansetron  (ZOFRAN -ODT) 4 MG disintegrating tablet, Take 1 tablet (4 mg total) by mouth every 8 (eight) hours as needed for nausea or vomiting. (Patient taking differently: Take 4 mg by mouth every 8 (eight) hours as needed for nausea or vomiting (dissolve orally).), Disp: 20 tablet, Rfl: 0   pantoprazole  (PROTONIX ) 40 MG tablet, Take 40 mg by mouth daily before breakfast., Disp: , Rfl:    polyethylene glycol powder (GLYCOLAX /MIRALAX ) 17 GM/SCOOP powder, Take 17 g by mouth daily. Dissolve 1 capful (17g) in 4-8 ounces of liquid and take by mouth daily., Disp: 238 g, Rfl: 0   senna-docusate (SENOKOT-S) 8.6-50 MG tablet, Take 1 tablet by mouth 2 (two) times daily., Disp: , Rfl:    simvastatin  (ZOCOR ) 20 MG tablet, Take 40 mg by mouth at bedtime., Disp: , Rfl:  sodium chloride  1 g tablet, Take 1 tablet (1 g total) by mouth 3 (three) times daily with meals., Disp: , Rfl:    sodium chloride  HYPERTONIC 3 % nebulizer solution, Take 4 mLs by nebulization 2 (two) times daily., Disp: , Rfl:   "

## 2024-11-03 ENCOUNTER — Encounter: Payer: Self-pay | Admitting: Pulmonary Disease

## 2024-11-07 ENCOUNTER — Emergency Department (HOSPITAL_COMMUNITY)

## 2024-11-07 ENCOUNTER — Emergency Department (HOSPITAL_COMMUNITY)
Admission: EM | Admit: 2024-11-07 | Discharge: 2024-11-07 | Disposition: A | Discharge status: Home / Self Care | Attending: Emergency Medicine | Admitting: Emergency Medicine

## 2024-11-07 ENCOUNTER — Other Ambulatory Visit: Payer: Self-pay

## 2024-11-07 ENCOUNTER — Encounter (HOSPITAL_COMMUNITY): Payer: Self-pay

## 2024-11-07 DIAGNOSIS — I272 Pulmonary hypertension, unspecified: Secondary | ICD-10-CM | POA: Diagnosis not present

## 2024-11-07 DIAGNOSIS — J449 Chronic obstructive pulmonary disease, unspecified: Secondary | ICD-10-CM | POA: Diagnosis not present

## 2024-11-07 DIAGNOSIS — Z8673 Personal history of transient ischemic attack (TIA), and cerebral infarction without residual deficits: Secondary | ICD-10-CM | POA: Diagnosis not present

## 2024-11-07 DIAGNOSIS — J40 Bronchitis, not specified as acute or chronic: Secondary | ICD-10-CM | POA: Insufficient documentation

## 2024-11-07 DIAGNOSIS — Z7902 Long term (current) use of antithrombotics/antiplatelets: Secondary | ICD-10-CM | POA: Diagnosis not present

## 2024-11-07 DIAGNOSIS — Z87891 Personal history of nicotine dependence: Secondary | ICD-10-CM | POA: Insufficient documentation

## 2024-11-07 DIAGNOSIS — R059 Cough, unspecified: Secondary | ICD-10-CM | POA: Diagnosis present

## 2024-11-07 LAB — CBC WITH DIFFERENTIAL/PLATELET
Abs Immature Granulocytes: 0.39 K/uL — ABNORMAL HIGH (ref 0.00–0.07)
Basophils Absolute: 0.1 K/uL (ref 0.0–0.1)
Basophils Relative: 1 %
Eosinophils Absolute: 0.3 K/uL (ref 0.0–0.5)
Eosinophils Relative: 4 %
HCT: 32.3 % — ABNORMAL LOW (ref 36.0–46.0)
Hemoglobin: 10.4 g/dL — ABNORMAL LOW (ref 12.0–15.0)
Immature Granulocytes: 6 %
Lymphocytes Relative: 14 %
Lymphs Abs: 1 K/uL (ref 0.7–4.0)
MCH: 31.8 pg (ref 26.0–34.0)
MCHC: 32.2 g/dL (ref 30.0–36.0)
MCV: 98.8 fL (ref 80.0–100.0)
Monocytes Absolute: 0.6 K/uL (ref 0.1–1.0)
Monocytes Relative: 8 %
Neutro Abs: 4.8 K/uL (ref 1.7–7.7)
Neutrophils Relative %: 67 %
Platelets: 271 K/uL (ref 150–400)
RBC: 3.27 MIL/uL — ABNORMAL LOW (ref 3.87–5.11)
RDW: 13.6 % (ref 11.5–15.5)
Smear Review: NORMAL
WBC: 7.1 K/uL (ref 4.0–10.5)
nRBC: 0 % (ref 0.0–0.2)

## 2024-11-07 LAB — BASIC METABOLIC PANEL WITH GFR
Anion gap: 10 (ref 5–15)
BUN: 16 mg/dL (ref 8–23)
CO2: 26 mmol/L (ref 22–32)
Calcium: 9.4 mg/dL (ref 8.9–10.3)
Chloride: 103 mmol/L (ref 98–111)
Creatinine, Ser: 1.08 mg/dL — ABNORMAL HIGH (ref 0.44–1.00)
GFR, Estimated: 54 mL/min — ABNORMAL LOW
Glucose, Bld: 141 mg/dL — ABNORMAL HIGH (ref 70–99)
Potassium: 4.5 mmol/L (ref 3.5–5.1)
Sodium: 139 mmol/L (ref 135–145)

## 2024-11-07 LAB — RESP PANEL BY RT-PCR (RSV, FLU A&B, COVID)  RVPGX2
Influenza A by PCR: NEGATIVE
Influenza B by PCR: NEGATIVE
Resp Syncytial Virus by PCR: NEGATIVE
SARS Coronavirus 2 by RT PCR: NEGATIVE

## 2024-11-07 MED ORDER — DOXYCYCLINE HYCLATE 100 MG PO CAPS: 100.0000 mg | ORAL_CAPSULE | Freq: Two times a day (BID) | ORAL | 0 refills | Status: AC

## 2024-11-07 MED ORDER — IOHEXOL 350 MG/ML SOLN
75.0000 mL | Freq: Once | INTRAVENOUS | Status: AC | PRN
  Administered 2024-11-07: 75 mL via INTRAVENOUS

## 2024-11-07 NOTE — ED Notes (Signed)
 Patient transported to CT

## 2024-11-07 NOTE — ED Triage Notes (Signed)
 Pt BIB [facility] from home for coughing up blood. EMS noted pink-tinged sputum. Covid + last week. Hx of blood clots, on Plavix . Denies increased SHOB, hs not taken her at home breathing treatment today.   VS 99% 2L [place] (baseline), 140/60, 90 HR

## 2024-11-07 NOTE — ED Provider Notes (Signed)
 " [place] EMERGENCY DEPARTMENT AT [facility] Provider Note   CSN: [id number] Arrival date & time: 11/07/24  1013     Patient presents with: Cough   Shannon Roberts is a 74 y.o. female.   HPI 74 year old female history of COPD, COVID infection in December, hyponatremia, reports that she returned home from rehab facility last Monday.  She has had an ongoing cough that she states feels like more it is in her throat and her in her upper chest.  However she did cough up some blood-tinged sputum and was concerned secondary to this and came into the ED.  She reports taking Plavix .  She was a history of stroke.  She states that she has had some swelling in her right leg for which she had a Doppler study.  She normally takes albuterol  several times a day.  She reports not having had it yet today.  She denies any fever or chills.  She has been on home oxygen since last hospitalization.  She denies any chest pain, fever, or chills    Prior to Admission medications  Medication Sig Start Date End Date Taking? Authorizing Provider  doxycycline  (VIBRAMYCIN ) 100 MG capsule Take 1 capsule (100 mg total) by mouth 2 (two) times daily. 11/07/24  Yes Levander Houston, MD  acetaminophen  (TYLENOL ) 325 MG tablet Take 2 tablets (650 mg total) by mouth every 6 (six) hours as needed for mild pain (pain score 1-3) or fever (or Fever >/= 101). 10/08/24   Barbarann Nest, MD  albuterol  (PROVENTIL ) (2.5 MG/3ML) 0.083% nebulizer solution Take 3 mLs (2.5 mg total) by nebulization every 6 (six) hours as needed for wheezing or shortness of breath. 07/01/24   Kara Dorn NOVAK, MD  amLODipine  (NORVASC ) 5 MG tablet Take 5 mg by mouth daily. 03/11/21   [provider]  atenolol  (TENORMIN ) 50 MG tablet Take 50 mg by mouth daily.    [provider]  Cholecalciferol (VITAMIN D3) 5000 units CAPS Take 5,000 Units by mouth See admin instructions. Take 5,000 units by mouth daily except on Saturdays and Sundays     [provider]  clopidogrel  (PLAVIX ) 75 MG tablet Take 75 mg by mouth daily.     [provider]  DULoxetine  (CYMBALTA ) 30 MG capsule Take 60 mg by mouth in the morning.    [provider]  Ensure Max Protein (ENSURE MAX PROTEIN) LIQD Take 330 mLs (11 oz total) by mouth 2 (two) times daily. 10/23/24   Barbarann Nest, MD  Evolocumab  (REPATHA  SURECLICK) 140 MG/ML SOAJ Inject 140 mg into the skin every 14 (fourteen) days. INJECT UNDER THE SKIN EVERY 14 DAYS. Patient taking differently: Inject 140 mg into the skin every 14 (fourteen) days. 06/08/24   O'NealDarryle Ned, MD  ferrous sulfate  325 (65 FE) MG tablet Take 325 mg by mouth daily with breakfast.    [provider]  fluticasone  (FLONASE ) 50 MCG/ACT nasal spray Place 1 spray into both nostrils 2 (two) times daily as needed for allergies or rhinitis.    [provider]  Fluticasone -Umeclidin-Vilant (TRELEGY ELLIPTA ) 200-62.5-25 MCG/ACT AEPB Inhale 1 puff into the lungs daily. 04/22/24   Hope Almarie ORN, NP  guaiFENesin -dextromethorphan  (ROBITUSSIN DM) 100-10 MG/5ML syrup Take 5 mLs by mouth every 4 (four) hours as needed for cough (chest congestion). 10/23/24   Barbarann Nest, MD  HYDROcodone -acetaminophen  (NORCO/VICODIN) 5-325 MG tablet Take 1-2 tablets by mouth every 4 (four) hours as needed for moderate pain (pain score 4-6). 10/23/24  Barbarann Nest, MD  Ipratropium-Albuterol  (COMBIVENT  RESPIMAT) 20-100 MCG/ACT AERS respimat Inhale 1 puff into the lungs every 4 (four) hours as needed for wheezing or shortness of breath. 08/07/21   Kara Dorn NOVAK, MD  isosorbide  mononitrate (IMDUR ) 30 MG 24 hr tablet Take 30 mg by mouth in the morning and at bedtime. 09/24/14   [provider]  levothyroxine  (SYNTHROID ) 50 MCG tablet Take 50 mcg by mouth daily before breakfast.    [provider]  [Paused] lisinopril  (PRINIVIL ,ZESTRIL ) 40 MG tablet Take 40 mg by mouth daily. Wait to take  this until your doctor or other care provider tells you to start again.    [provider]  LORazepam  (ATIVAN ) 0.5 MG tablet Take 1-1.5 tablets (0.5-0.75 mg total) by mouth every 6 (six) hours as needed for anxiety. 10/23/24   Barbarann Nest, MD  MUCINEX  600 MG 12 hr tablet Take 600 mg by mouth 2 (two) times daily as needed for to loosen phlegm.    [provider]  Multiple Vitamin (MULTIVITAMIN WITH MINERALS) TABS tablet Take 1 tablet by mouth daily. 10/23/24   Barbarann Nest, MD  ondansetron  (ZOFRAN -ODT) 4 MG disintegrating tablet Take 1 tablet (4 mg total) by mouth every 8 (eight) hours as needed for nausea or vomiting. Patient taking differently: Take 4 mg by mouth every 8 (eight) hours as needed for nausea or vomiting (dissolve orally). 09/29/24   Logan Ubaldo NOVAK, PA-C  pantoprazole  (PROTONIX ) 40 MG tablet Take 40 mg by mouth daily before breakfast.    [provider]  polyethylene glycol powder (GLYCOLAX /MIRALAX ) 17 GM/SCOOP powder Take 17 g by mouth daily. Dissolve 1 capful (17g) in 4-8 ounces of liquid and take by mouth daily. 10/08/24   Barbarann Nest, MD  senna-docusate (SENOKOT-S) 8.6-50 MG tablet Take 1 tablet by mouth 2 (two) times daily. 10/23/24   Barbarann Nest, MD  simvastatin  (ZOCOR ) 20 MG tablet Take 40 mg by mouth at bedtime. 06/06/22   [provider]  sodium chloride  1 g tablet Take 1 tablet (1 g total) by mouth 3 (three) times daily with meals. 10/23/24   Barbarann Nest, MD  sodium chloride  HYPERTONIC 3 % nebulizer solution Take 4 mLs by nebulization 2 (two) times daily. 10/23/24   Barbarann Nest, MD    Allergies: Azithromycin , Codeine , Carvedilol, Dapsone, Nsaids, Septra [sulfamethoxazole-trimethoprim], Amoxicillin, Niacin and related, Paroxetine hcl, Sertraline hcl, and Wellbutrin [bupropion]    Review of Systems  Updated Vital Signs BP 122/67 (BP Location: Right Arm)   Pulse 78   Temp 98.3 F (36.8 C) (Oral)   Resp 20   Ht 1.651 m (5'  5)   Wt 88.2 kg   SpO2 100%   BMI 32.36 kg/m   Physical Exam Vitals and nursing note reviewed.  Constitutional:      Appearance: She is obese.  HENT:     Head: Normocephalic.     Right Ear: External ear normal.     Left Ear: External ear normal.     Mouth/Throat:     Pharynx: Oropharynx is clear.  Eyes:     Pupils: Pupils are equal, round, and reactive to light.  Cardiovascular:     Rate and Rhythm: Normal rate and regular rhythm.     Pulses: Normal pulses.     Heart sounds: Normal heart sounds.  Pulmonary:     Effort: Pulmonary effort is normal.     Breath sounds: Decreased air movement present. Examination of the right-upper field reveals decreased breath sounds. Examination  of the left-upper field reveals decreased breath sounds. Examination of the right-middle field reveals decreased breath sounds. Examination of the left-middle field reveals decreased breath sounds. Examination of the right-lower field reveals decreased breath sounds. Examination of the left-lower field reveals decreased breath sounds. Decreased breath sounds present. No wheezing or rhonchi.  Musculoskeletal:     Cervical back: Normal range of motion.  Neurological:     Mental Status: She is alert.     (all labs ordered are listed, but only abnormal results are displayed) Labs Reviewed  CBC WITH DIFFERENTIAL/PLATELET - Abnormal; Notable for the following components:      Result Value   RBC 3.27 (*)    Hemoglobin 10.4 (*)    HCT 32.3 (*)    Abs Immature Granulocytes 0.39 (*)    All other components within normal limits  BASIC METABOLIC PANEL WITH GFR - Abnormal; Notable for the following components:   Glucose, Bld 141 (*)    Creatinine, Ser 1.08 (*)    GFR, Estimated 54 (*)    All other components within normal limits  RESP PANEL BY RT-PCR (RSV, FLU A&B, COVID)  [id number]    EKG: None  Radiology: CT Angio Chest PE W and/or Wo Contrast Result Date: 11/07/2024 EXAM: CTA CHEST 11/07/2024 12:18:12  PM TECHNIQUE: CTA of the chest was performed after the administration of intravenous contrast. Multiplanar reformatted images are provided for review. MIP images are provided for review. Automated exposure control, iterative reconstruction, and/or weight based adjustment of the mA/kV was utilized to reduce the radiation dose to as low as reasonably achievable. COMPARISON: CT 10/16/2024 CLINICAL HISTORY: Hemoptysis, history of pulmonary embolism. FINDINGS: PULMONARY ARTERIES: Pulmonary arteries are adequately opacified for evaluation. No acute pulmonary embolus. Main pulmonary artery is normal in caliber. MEDIASTINUM: The heart and pericardium demonstrate no acute abnormality. There is no acute abnormality of the thoracic aorta. LYMPH NODES: No mediastinal, hilar or axillary lymphadenopathy. LUNGS AND PLEURA: Peribronchial thickening in the right lower lobe with mild peripheral consolidation suggests bronchitis. There is a new pulmonary nodule in the right upper lobe measuring 5 mm on image 77 of series 6. Stable small 4 mm nodule in the medial aspect of the right upper lobe on image 63. Centrilobular emphysema in the upper lobes. Central airways are normal. No focal consolidation or pulmonary edema. No evidence of pleural effusion or pneumothorax. UPPER ABDOMEN: Limited images of the upper abdomen are unremarkable. SOFT TISSUES AND BONES: No acute bone or soft tissue abnormality. IMPRESSION: 1. No evidence of pulmonary embolism. 2. Peribronchial thickening in the right lower lobe with mild peripheral consolidation, suggestive of bronchitis. 3. New 5 mm right upper lobe pulmonary nodule, with additional stable 4 mm right upper lobe nodule; optional non-contrast chest CT at 12 months as per Fleischner Society Guidelines. 4. Centrilobular emphysema in the upper lobes; pulmonary emphysema is an independent risk factor for lung cancer. Recommend consideration for evaluation for a low-dose CT lung cancer screening  program. Electronically signed by: Norleen Boxer MD 11/07/2024 01:06 PM EST RP Workstation: [id number]   DG Chest 2 View Result Date: 11/07/2024 EXAM: 2 VIEW(S) XRAY OF THE CHEST 11/07/2024 11:18:00 AM COMPARISON: 10/10/2024 CLINICAL HISTORY: Cough cough cough cough cough. FINDINGS: LUNGS AND PLEURA: Emphysema. No focal pulmonary opacity. No pleural effusion. No pneumothorax. HEART AND MEDIASTINUM: Aortic atherosclerosis. No acute abnormality of the cardiac and mediastinal silhouettes. BONES AND SOFT TISSUES: Thoracic degenerative changes. IMPRESSION: 1. No acute findings. 2. Emphysema. Electronically signed by: Waddell Calk MD 11/07/2024 11:29  AM EST RP Workstation: [id number]     Procedures   Medications Ordered in the ED  iohexol  (OMNIPAQUE ) 350 MG/ML injection 75 mL (75 mLs Intravenous Contrast Given 11/07/24 1219)    Clinical Course as of 11/07/24 1315  Sun Nov 07, 2024  1206 CBC reviewed interpreted stable anemia from first prior [DR]  1207 Basic metabolic panel reviewed interpreted significant for glucose elevated at 141 [DR]  1310 CT angio lungs no evidence of pulmonary embolism, peribronchial thickening in the right lower lobe with mild peripheral consolidation suggestive of bronchitis, new 5 mm right upper lobe nodule with additional stable 4 mm right upper lobe nodule optional noncontrast chest CT at 12 months per radiology guidelines, centrilobular emphysema in the upper lobes [DR]    Clinical Course User Index [DR] Levander Houston, MD                                  74 year old female history of COPD, recent hospitalization for hyponatremia, some ongoing cough over the past week, presents with some blood-tinged sputum.  Patient evaluated here with labs, imaging, and physical exam.  Patient is hemodynamically stable.  Labs are stable although anemia is present.  CT was obtained to evaluate for PE.  No obvious PE noted.  There is some peribronchial thickening consistent with  bronchitis and COPD.  Given patient's symptoms we will start on antibiotics.  Patient has a 5 mm nodule.  She has a history of smoking although she has not smoked for the last 15 years.  She is advised to follow-up with her primary care physician. Here in the ED she remains hemodynamically stable with normal oxygen saturations and normal vital signs. I have discussed the above with the patient.  We have discussed return precautions and need for follow-up patient voices understanding of plan Labs reviewed, prior evaluations inpatient and office reviewed, imaging reviewed, patient reevaluated and appears stable for discharge    Final diagnoses:  Bronchitis  Pulmonary hypertension [facility])    ED Discharge Orders          Ordered    doxycycline  (VIBRAMYCIN ) 100 MG capsule  2 times daily        11/07/24 1314               Levander Houston, MD 11/07/24 1315  "

## 2024-11-07 NOTE — Discharge Instructions (Signed)
 You were seen and evaluated in the emergency department today for cough with some bloody sputum.  There is no evidence of pulmonary embolism or severe bleeding from your lungs.  You do appear to have some bronchitis.  You are being started on doxycycline . A CT was done of your lungs and there were pulmonary nodules noted.  1 was a new 1.  Please follow-up with your doctor to decide if you need further screening for lung cancer.

## 2024-11-10 ENCOUNTER — Telehealth: Payer: Self-pay | Admitting: Pulmonary Disease

## 2024-11-10 DIAGNOSIS — J441 Chronic obstructive pulmonary disease with (acute) exacerbation: Secondary | ICD-10-CM

## 2024-11-10 NOTE — Telephone Encounter (Signed)
 Copied from CRM #[id number]. Topic: General - Other >> Nov 10, 2024 11:17 AM Corean SAUNDERS wrote: Reason for CRM: Patient is requesting a call back from Dr. Ples nurse but refused to disclose what the call is pertaining to.

## 2024-11-11 ENCOUNTER — Other Ambulatory Visit (HOSPITAL_COMMUNITY): Payer: Self-pay

## 2024-11-11 ENCOUNTER — Telehealth: Payer: Self-pay

## 2024-11-11 MED ORDER — PREDNISONE 10 MG PO TABS: 40.0000 mg | ORAL_TABLET | Freq: Every day | ORAL | 0 refills | Status: AC

## 2024-11-11 NOTE — Telephone Encounter (Signed)
 Prednisone  sent to the pharmacy.  Thanks, JD

## 2024-11-11 NOTE — Telephone Encounter (Signed)
 Received Ohtuvayre  new start paperwork. Completed form and faxed with clinicals and insurance card copy to [facility]   Phone#: [phone] Fax#: [phone]

## 2024-11-12 NOTE — Telephone Encounter (Signed)
 Received fax from [employer] confimring receipt of Ohtuvayre  enrollment form  Patient ID : [id number]

## 2024-11-12 NOTE — Telephone Encounter (Signed)
 Received fax from [employer] with summary of benefits. Referral form for Ohtuvayre  received. Rx will be triaged to [facility] (phone: [phone]). Once benefits investigation completed, pharmacy will reach out the patient to schedule shipment. If medication is unaffordable, patient will need to express financial hardship to be referred back to [facility] for patient assistance program pre-screening.   Patient ID: [id number] Pharmacy phone: [facility] (phone: [phone]) [facility] Phone#: [phone]

## 2024-11-12 NOTE — Telephone Encounter (Signed)
 Patient VBU, -NFN

## 2024-11-17 ENCOUNTER — Telehealth: Payer: Self-pay

## 2024-11-17 ENCOUNTER — Other Ambulatory Visit: Payer: Self-pay

## 2024-11-17 MED ORDER — SODIUM CHLORIDE 3 % IN NEBU: 4.0000 mL | INHALATION_SOLUTION | Freq: Two times a day (BID) | RESPIRATORY_TRACT | 6 refills | Status: AC

## 2024-11-17 NOTE — Telephone Encounter (Signed)
 Copied from CRM [id number]. Topic: Clinical - Medication Refill >> Nov 17, 2024  9:22 AM Joesph PARAS wrote: Medication: sodium chloride  1 g tablet - Patient is requesting a new rx, was given in hospital.  Has the patient contacted their pharmacy? Yes (Agent: If no, request that the patient contact the pharmacy for the refill. If patient does not wish to contact the pharmacy document the reason why and proceed with request.) (Agent: If yes, when and what did the pharmacy advise?)  This is the patient's preferred pharmacy:  [id number] [place], [place] - [address] AT [facility] OF [facility] [address] [place] [address] Phone: [phone] Fax: [phone]  Is this the correct pharmacy for this prescription? Yes If no, delete pharmacy and type the correct one.   Has the prescription been filled recently? No  Is the patient out of the medication? Yes  Has the patient been seen for an appointment in the last year OR does the patient have an upcoming appointment? Yes  Can we respond through MyChart? Yes  Agent: Please be advised that Rx refills may take up to 3 business days. We ask that you follow-up with your pharmacy.  Spoke with patient VBU,  Rx for [facility] neb sol sent to pharmacy

## 2024-11-17 NOTE — Telephone Encounter (Signed)
 Has patient tried the [facility] for patient assistance?  Thanks, JD

## 2024-11-17 NOTE — Telephone Encounter (Signed)
 If patient is needing refills on sodium chloride  tablets that will need to come from her primary care team or her kidney doctors who are managing her sodium levels.  If she needs sodium chloride  nebulizer solution then we can send that refill in to the pharmacy.  Thanks, JD

## 2024-11-17 NOTE — Telephone Encounter (Signed)
 Copied from CRM #[id number]. Topic: Clinical - Prescription Issue >> Nov 17, 2024  9:23 AM Joesph PARAS wrote: Reason for CRM: Patient requesting alternative to OHTUVAYRE , as he copay is $500+ and patient cannot afford it. Please advise patient.   VBU, gave info from the pharmacy phone    - NFN

## 2024-11-17 NOTE — Telephone Encounter (Signed)
 sodium chloride  nebulizer solution   Rx sent to pharmacy

## 2024-11-18 ENCOUNTER — Other Ambulatory Visit: Payer: Self-pay | Admitting: Primary Care

## 2024-12-08 ENCOUNTER — Ambulatory Visit: Admitting: Pulmonary Disease

## 2025-01-21 ENCOUNTER — Ambulatory Visit: Admitting: Cardiovascular Disease
